# Patient Record
Sex: Male | Born: 1975 | Race: White | Hispanic: No | Smoking: Current every day smoker
Health system: Southern US, Community
[De-identification: ages and names within clinical notes are randomized; demographics above are authoritative.]

## PROBLEM LIST (undated history)

## (undated) DIAGNOSIS — B1920 Unspecified viral hepatitis C without hepatic coma: Secondary | ICD-10-CM

## (undated) DIAGNOSIS — F419 Anxiety disorder, unspecified: Secondary | ICD-10-CM

## (undated) DIAGNOSIS — F1910 Other psychoactive substance abuse, uncomplicated: Secondary | ICD-10-CM

## (undated) DIAGNOSIS — Z8719 Personal history of other diseases of the digestive system: Secondary | ICD-10-CM

## (undated) HISTORY — PX: HERNIA REPAIR: SHX51

---

## 2008-06-05 ENCOUNTER — Emergency Department (HOSPITAL_COMMUNITY): Admission: EM | Admit: 2008-06-05 | Discharge: 2008-06-05 | Payer: Self-pay | Admitting: Emergency Medicine

## 2009-05-07 ENCOUNTER — Emergency Department (HOSPITAL_COMMUNITY): Admission: EM | Admit: 2009-05-07 | Discharge: 2009-05-07 | Payer: Self-pay | Admitting: Emergency Medicine

## 2012-02-27 ENCOUNTER — Emergency Department (HOSPITAL_COMMUNITY)
Admission: EM | Admit: 2012-02-27 | Discharge: 2012-02-27 | Disposition: A | Discharge status: Home / Self Care | Payer: Self-pay | Attending: Emergency Medicine | Admitting: Emergency Medicine

## 2012-02-27 ENCOUNTER — Encounter (HOSPITAL_COMMUNITY): Payer: Self-pay | Admitting: Emergency Medicine

## 2012-02-27 ENCOUNTER — Emergency Department (HOSPITAL_COMMUNITY): Payer: Self-pay

## 2012-02-27 DIAGNOSIS — R6889 Other general symptoms and signs: Secondary | ICD-10-CM | POA: Insufficient documentation

## 2012-02-27 DIAGNOSIS — J3489 Other specified disorders of nose and nasal sinuses: Secondary | ICD-10-CM | POA: Insufficient documentation

## 2012-02-27 DIAGNOSIS — R51 Headache: Secondary | ICD-10-CM | POA: Insufficient documentation

## 2012-02-27 DIAGNOSIS — R059 Cough, unspecified: Secondary | ICD-10-CM | POA: Insufficient documentation

## 2012-02-27 DIAGNOSIS — J069 Acute upper respiratory infection, unspecified: Secondary | ICD-10-CM | POA: Insufficient documentation

## 2012-02-27 DIAGNOSIS — R5381 Other malaise: Secondary | ICD-10-CM | POA: Insufficient documentation

## 2012-02-27 DIAGNOSIS — IMO0001 Reserved for inherently not codable concepts without codable children: Secondary | ICD-10-CM | POA: Insufficient documentation

## 2012-02-27 DIAGNOSIS — R05 Cough: Secondary | ICD-10-CM | POA: Insufficient documentation

## 2012-02-27 DIAGNOSIS — B9789 Other viral agents as the cause of diseases classified elsewhere: Secondary | ICD-10-CM | POA: Insufficient documentation

## 2012-02-27 DIAGNOSIS — F17200 Nicotine dependence, unspecified, uncomplicated: Secondary | ICD-10-CM | POA: Insufficient documentation

## 2012-02-27 DIAGNOSIS — R5383 Other fatigue: Secondary | ICD-10-CM | POA: Insufficient documentation

## 2012-02-27 MED ORDER — SALINE NASAL SPRAY 0.65 % NA SOLN: 1.0000 | NASAL | Status: DC | PRN

## 2012-02-27 MED ORDER — GUAIFENESIN ER 600 MG PO TB12: 600.0000 mg | ORAL_TABLET | Freq: Two times a day (BID) | ORAL | Status: DC

## 2012-02-27 MED ORDER — BENZONATATE 100 MG PO CAPS: 200.0000 mg | ORAL_CAPSULE | Freq: Two times a day (BID) | ORAL | Status: DC | PRN

## 2012-02-27 NOTE — ED Provider Notes (Signed)
History     CSN: [id number]  Arrival date & time 02/27/12  1215   First MD Initiated Contact with Patient 02/27/12 1410      Chief Complaint  Patient presents with  . URI  . Cough  . Headache    (Consider location/radiation/quality/duration/timing/severity/associated sxs/prior treatment) HPI Comments: Brendan Preston presents with a cough, runny nose, sinus pressure, and rhinorrhea over the last week.  He has multiple coworkers with similar symptoms.  He became concerned because he has had diffivulty sleeping secondary to sinus congestion.  He denies any chest pain, sore throat, earache, NVD, or rashes.  He denies leg pain or swelling also.    The history is provided by the patient. No language interpreter was used.    History reviewed. No pertinent past medical history.  History reviewed. No pertinent past surgical history.  History reviewed. No pertinent family history.  History  Substance Use Topics  . Smoking status: Current Every Day Smoker  . Smokeless tobacco: Not on file  . Alcohol Use: Yes     Comment: occ      Review of Systems  Constitutional: Positive for fatigue. Negative for fever, chills, diaphoresis, activity change and appetite change.  HENT: Positive for congestion, rhinorrhea, sneezing, postnasal drip and sinus pressure. Negative for ear pain, nosebleeds, sore throat, facial swelling, drooling, mouth sores, trouble swallowing, neck pain, neck stiffness and ear discharge.   Eyes: Negative.   Respiratory: Positive for cough. Negative for choking, chest tightness, shortness of breath and wheezing.   Cardiovascular: Negative for chest pain, palpitations and leg swelling.  Gastrointestinal: Negative for nausea, vomiting, abdominal pain and diarrhea.  Genitourinary: Negative.   Musculoskeletal: Positive for myalgias.  Skin: Negative.   Neurological: Negative.     Allergies  Review of patient's allergies indicates no known allergies.  Home Medications    No current outpatient prescriptions on file.  BP 117/76  Pulse 71  Temp 98 F (36.7 C) (Oral)  Resp 18  SpO2 100%  Physical Exam  Nursing note and vitals reviewed. Constitutional: He is oriented to person, place, and time. He appears well-developed and well-nourished. No distress.  HENT:  Head: Normocephalic and atraumatic. Head is without raccoon's eyes, without Battle's sign, without right periorbital erythema and without left periorbital erythema. No trismus in the jaw.  Right Ear: Hearing, tympanic membrane, external ear and ear canal normal. No mastoid tenderness.  Left Ear: Hearing, tympanic membrane, external ear and ear canal normal. No mastoid tenderness.  Nose: Mucosal edema and rhinorrhea present. No nose lacerations, sinus tenderness, septal deviation or nasal septal hematoma. No epistaxis. Right sinus exhibits no maxillary sinus tenderness and no frontal sinus tenderness. Left sinus exhibits no maxillary sinus tenderness and no frontal sinus tenderness.  Mouth/Throat: Uvula is midline, oropharynx is clear and moist and mucous membranes are normal. Mucous membranes are not pale, not dry and not cyanotic. He does not have dentures. No oral lesions. Abnormal dentition. Dental caries present. No dental abscesses or uvula swelling. No oropharyngeal exudate, posterior oropharyngeal edema, posterior oropharyngeal erythema or tonsillar abscesses.  Eyes: Conjunctivae normal are normal. Pupils are equal, round, and reactive to light. Right eye exhibits no discharge. Left eye exhibits no discharge. No scleral icterus.  Neck: Normal range of motion. Neck supple. No JVD present. No tracheal deviation present.  Cardiovascular: Normal rate, regular rhythm, S1 normal, S2 normal, normal heart sounds, intact distal pulses and normal pulses.  PMI is not displaced.  Exam reveals no gallop and no decreased  pulses.   No murmur heard. Pulmonary/Chest: Effort normal and breath sounds normal. No stridor.  No respiratory distress. He has no wheezes. He has no rales. He exhibits no tenderness.  Abdominal: Soft. Bowel sounds are normal. He exhibits no distension and no mass. There is no tenderness. There is no rebound and no guarding.  Musculoskeletal: Normal range of motion. He exhibits no edema and no tenderness.  Lymphadenopathy:    He has no cervical adenopathy.  Neurological: He is alert and oriented to person, place, and time.  Skin: Skin is warm and dry. No rash noted. He is not diaphoretic. No erythema. No pallor.  Psychiatric: He has a normal mood and affect. His behavior is normal.    ED Course  Procedures (including critical care time)  Labs Reviewed - No data to display Dg Chest 2 View  02/27/2012  *RADIOLOGY REPORT*  Clinical Data: Cough.  CHEST - 2 VIEW  Comparison: None.  Findings: Cardiomediastinal silhouette appears normal.  No acute pulmonary disease is noted.  Bony thorax is intact.  IMPRESSION: No acute cardiopulmonary abnormality seen.   Original Report Authenticated By: Lupita Raider.,  M.D.      No diagnosis found.    MDM  Pt presents for evaluation of a generalized ill feeling, congestion, sinus pressure and a cough.  His symptoms and exam are consistent with a viaral URI.  Encouraged smoking cessation.  He appears nontoxic and has a clear CXR.  Plan discharge home with symptomatic care instructions.        Tobin Chad, MD 02/27/12 [phone]

## 2012-02-27 NOTE — ED Notes (Signed)
Pt given d/c teaching and prescriptions. Pt has no further questions upon d/c. Pt does not appear to be in any acute distress upon d/c.

## 2012-02-27 NOTE — ED Notes (Signed)
Pt states cough started Monday and productive enough at times to make him gag; pt states people at his work have been out with similar symptoms; pt denies nausea; denies dizziness and shortness of breath; pt mentating appropriately.

## 2012-02-27 NOTE — ED Notes (Signed)
Pt c/o URI with productive cough and yellow sputum x 1 week with generalized body aches and HA

## 2012-04-22 ENCOUNTER — Emergency Department (HOSPITAL_COMMUNITY)
Admission: EM | Admit: 2012-04-22 | Discharge: 2012-04-22 | Disposition: A | Discharge status: Home / Self Care | Payer: Self-pay | Attending: Emergency Medicine | Admitting: Emergency Medicine

## 2012-04-22 ENCOUNTER — Emergency Department (HOSPITAL_COMMUNITY): Payer: Self-pay

## 2012-04-22 ENCOUNTER — Encounter (HOSPITAL_COMMUNITY): Payer: Self-pay | Admitting: *Deleted

## 2012-04-22 DIAGNOSIS — S0990XA Unspecified injury of head, initial encounter: Secondary | ICD-10-CM | POA: Insufficient documentation

## 2012-04-22 DIAGNOSIS — F17200 Nicotine dependence, unspecified, uncomplicated: Secondary | ICD-10-CM | POA: Insufficient documentation

## 2012-04-22 DIAGNOSIS — W503XXA Accidental bite by another person, initial encounter: Secondary | ICD-10-CM

## 2012-04-22 DIAGNOSIS — IMO0002 Reserved for concepts with insufficient information to code with codable children: Secondary | ICD-10-CM | POA: Insufficient documentation

## 2012-04-22 DIAGNOSIS — Z23 Encounter for immunization: Secondary | ICD-10-CM | POA: Insufficient documentation

## 2012-04-22 MED ORDER — OXYCODONE-ACETAMINOPHEN 5-325 MG PO TABS
2.0000 | ORAL_TABLET | Freq: Once | ORAL | Status: AC
  Administered 2012-04-22: 2 via ORAL
  Filled 2012-04-22: qty 2

## 2012-04-22 MED ORDER — AMOXICILLIN-POT CLAVULANATE 875-125 MG PO TABS
1.0000 | ORAL_TABLET | Freq: Once | ORAL | Status: AC
  Administered 2012-04-22: 1 via ORAL
  Filled 2012-04-22: qty 1

## 2012-04-22 MED ORDER — AMOXICILLIN-POT CLAVULANATE 875-125 MG PO TABS: 1.0000 | ORAL_TABLET | Freq: Two times a day (BID) | ORAL | Status: DC

## 2012-04-22 MED ORDER — TETANUS-DIPHTH-ACELL PERTUSSIS 5-2.5-18.5 LF-MCG/0.5 IM SUSP
0.5000 mL | Freq: Once | INTRAMUSCULAR | Status: AC
  Administered 2012-04-22: 0.5 mL via INTRAMUSCULAR
  Filled 2012-04-22: qty 0.5

## 2012-04-22 MED ORDER — OXYCODONE-ACETAMINOPHEN 5-325 MG PO TABS: 2.0000 | ORAL_TABLET | ORAL | Status: DC | PRN
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
## 2012-04-22 NOTE — ED Notes (Signed)
Pt to xray

## 2012-04-22 NOTE — ED Notes (Signed)
Bed:WA13<BR> Expected date:<BR> Expected time:<BR> Means of arrival:<BR> Comments:<BR> ems

## 2012-04-22 NOTE — ED Notes (Signed)
Assumed care of pt. Pt states pain unchanged since having 2 percocet. Would to be cleaned and dressed.

## 2012-04-22 NOTE — Progress Notes (Signed)
  Pt aware that the [facility] program does not assist with narcotics Pt voiced understanding
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
## 2012-04-22 NOTE — Progress Notes (Signed)
[facility] ED CM consulted by ED RN about medication assistance for [place] self pay pt.  CM reviewed [facility] program  with pt.  Pt agreed to [facility] program and voiced understanding that each medicine or Rx would be $3 each and is a one time service. Stated he agreed finding a self pay [place] pcp. CM entered pt information in [facility] program and printed his letter to take to the pharmacy of his choice CM discussed and provided written information for self pay pcps, importance of pcp for f/u care, www.needymeds.org, discounted pharmacies, and other [place] resources such as financial assistance, DSS and  health department Reviewed Health connect number to assist with finding self pay provider close to pt's residence. Reviewed resources for [facility], general medical clinics, [facility] out patient pharmacies, housing, and other resources in [employer]

## 2012-04-22 NOTE — ED Notes (Signed)
md at bedside  Pt alert and oriented x4. Respirations even and unlabored, bilateral symmetrical rise and fall of chest. Skin warm and dry. In no acute distress. Denies needs.   

## 2012-04-22 NOTE — ED Notes (Signed)
Pt and pts girlfriend at bedside, pt not satisfied he was able to get additional narcotics before being discharged. I offered to obtain order for Motrin 800mg  PO, pt and girlfriend declined. Pt and girlfriend state they have no ID to obtain prescription for narcotics tonight at chain pharmacies.

## 2012-06-08 ENCOUNTER — Encounter (HOSPITAL_COMMUNITY): Payer: Self-pay | Admitting: *Deleted

## 2012-06-08 ENCOUNTER — Emergency Department (HOSPITAL_COMMUNITY)
Admission: EM | Admit: 2012-06-08 | Discharge: 2012-06-08 | Disposition: A | Discharge status: Home / Self Care | Payer: Self-pay | Attending: Emergency Medicine | Admitting: Emergency Medicine

## 2012-06-08 DIAGNOSIS — Z008 Encounter for other general examination: Secondary | ICD-10-CM | POA: Insufficient documentation

## 2012-06-08 DIAGNOSIS — F1010 Alcohol abuse, uncomplicated: Secondary | ICD-10-CM | POA: Insufficient documentation

## 2012-06-08 DIAGNOSIS — F1110 Opioid abuse, uncomplicated: Secondary | ICD-10-CM | POA: Insufficient documentation

## 2012-06-08 DIAGNOSIS — F411 Generalized anxiety disorder: Secondary | ICD-10-CM | POA: Insufficient documentation

## 2012-06-08 DIAGNOSIS — F17200 Nicotine dependence, unspecified, uncomplicated: Secondary | ICD-10-CM | POA: Insufficient documentation

## 2012-06-08 HISTORY — DX: Other psychoactive substance abuse, uncomplicated: F19.10

## 2012-06-08 HISTORY — DX: Anxiety disorder, unspecified: F41.9

## 2012-06-08 LAB — COMPREHENSIVE METABOLIC PANEL
Albumin: 4.3 g/dL (ref 3.5–5.2)
Alkaline Phosphatase: 91 U/L (ref 39–117)
BUN: 14 mg/dL (ref 6–23)
Chloride: 101 mEq/L (ref 96–112)
Creatinine, Ser: 0.74 mg/dL (ref 0.50–1.35)
GFR calc Af Amer: >90 mL/min (ref >90)
Glucose, Bld: 108 mg/dL — ABNORMAL HIGH (ref 70–99)
Total Bilirubin: 0.3 mg/dL (ref 0.3–1.2)

## 2012-06-08 LAB — CBC
HCT: 45.7 % (ref 39.0–52.0)
Hemoglobin: 15.1 g/dL (ref 13.0–17.0)
MCHC: 33 g/dL (ref 30.0–36.0)
MCV: 87.5 fL (ref 78.0–100.0)
RDW: 14.4 % (ref 11.5–15.5)
WBC: 15.1 10*3/uL — ABNORMAL HIGH (ref 4.0–10.5)

## 2012-06-08 LAB — SALICYLATE LEVEL: Salicylate Lvl: <2 mg/dL — ABNORMAL LOW (ref 2.8–20.0)

## 2012-06-08 LAB — RAPID URINE DRUG SCREEN, HOSP PERFORMED
Barbiturates: NOT DETECTED
Benzodiazepines: NOT DETECTED

## 2012-06-08 LAB — ETHANOL: Alcohol, Ethyl (B): <11 mg/dL (ref 0–11)
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
## 2012-06-08 NOTE — ED Notes (Signed)
Pt has 2 pt belonging bags and a black duffle bag, security called to wand pt and belongings

## 2012-06-08 NOTE — ED Provider Notes (Signed)
History     CSN: [id number]  Arrival date & time 06/08/12  1057   First MD Initiated Contact with Patient 06/08/12 1309      Chief Complaint  Patient presents with  . Medical Clearance  . 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  His last use was 3-4 days ago.  He also uses Marijuana, but denies use of any other recreational drugs.  He denies regular alcohol use.  He states that he drank alcohol last evening, but had not drank any alcohol for several months before that.  He denies SI or HI.  He reports that he has never gone through Heroin Detox in the past.  He denies any physical complaints at this time.  The history is provided by the patient.    Past Medical History  Diagnosis Date  . Substance abuse   . Anxiety     Past Surgical History  Procedure Laterality Date  . Hernia repair      History reviewed. No pertinent family history.  History  Substance Use Topics  . Smoking status: Current Every Day Smoker  . Smokeless tobacco: Never Used  . Alcohol Use: Yes     Comment: occ      Review of Systems  Gastrointestinal: Negative for nausea and vomiting.  Neurological: Negative for tremors and headaches.  Psychiatric/Behavioral: Negative for suicidal ideas, hallucinations and confusion.  All other systems reviewed and are negative.    Allergies  Review of patient's allergies indicates no known allergies.  Home Medications  No current outpatient prescriptions on file.  BP 106/82  Temp(Src) 98.4 F (36.9 C) (Oral)  Resp 18  SpO2 96%  Physical Exam  Nursing note and vitals reviewed. Constitutional: He appears well-developed and well-nourished. No distress.  HENT:  Head: Normocephalic and atraumatic.  Mouth/Throat: Oropharynx is clear and moist.  Eyes: EOM are  normal. Pupils are equal, round, and reactive to light.  Neck: Normal range of motion. Neck supple.  Cardiovascular: Normal rate, regular rhythm and normal heart sounds.   Pulmonary/Chest: Effort normal and breath sounds normal.  Musculoskeletal: Normal range of motion.  Neurological: He is alert.  Skin: Skin is warm and dry. He is not diaphoretic.  Psychiatric: He has a normal mood and affect. His speech is normal and behavior is normal. Thought content normal. He expresses no homicidal and no suicidal ideation. He expresses no suicidal plans and no homicidal plans.    ED Course  Procedures (including critical care time)  Labs Reviewed  CBC - Abnormal; Notable for the following:    WBC 15.1 (*)    All other components within normal limits  COMPREHENSIVE METABOLIC PANEL - Abnormal; Notable for the following:    Glucose, Bld 108 (*)    All other components within normal limits  SALICYLATE LEVEL - Abnormal; Notable for the following:    Salicylate Lvl <2.0 (*)    All other components within normal limits  URINE RAPID DRUG SCREEN (HOSP PERFORMED) - Abnormal; Notable for the following:    Opiates POSITIVE (*)    Tetrahydrocannabinol POSITIVE (*)    All other components within normal limits  ACETAMINOPHEN LEVEL  ETHANOL   No results found.   No diagnosis found.  Discussed patient with Dr. Freida Busman.  He recommends contacting ACT team.  Discussed patient with the ACT team.  They state that they  will evaluate the patient and determine if he needs placement in a detox facility.    MDM  Patient presenting requesting detox from Heroin.  Patient does not need detox from Alcohol or any other recreational drug.  Patient denies SI or HI.  Discussed with ACT team who will evaluate the patient.        Pascal Lux Bull[place], PA-C 06/08/12 1549
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
## 2012-06-10 NOTE — ED Provider Notes (Signed)
Medical screening examination/treatment/procedure(s) were performed by non-physician practitioner and as supervising physician I was immediately available for consultation/collaboration.  Toy Baker, MD 06/10/12 1351

## 2013-04-16 ENCOUNTER — Ambulatory Visit: Payer: Self-pay | Admitting: Emergency Medicine

## 2013-04-16 ENCOUNTER — Ambulatory Visit: Payer: Self-pay

## 2013-04-16 VITALS — BP 110/70 | HR 81 | Temp 98.6°F | Resp 16 | Ht 69.5 in | Wt 151.0 lb

## 2013-04-16 DIAGNOSIS — F1910 Other psychoactive substance abuse, uncomplicated: Secondary | ICD-10-CM

## 2013-04-16 DIAGNOSIS — J209 Acute bronchitis, unspecified: Secondary | ICD-10-CM

## 2013-04-16 DIAGNOSIS — R059 Cough, unspecified: Secondary | ICD-10-CM

## 2013-04-16 DIAGNOSIS — F17200 Nicotine dependence, unspecified, uncomplicated: Secondary | ICD-10-CM

## 2013-04-16 DIAGNOSIS — R05 Cough: Secondary | ICD-10-CM

## 2013-04-16 DIAGNOSIS — D649 Anemia, unspecified: Secondary | ICD-10-CM

## 2013-04-16 LAB — POCT CBC
GRANULOCYTE PERCENT: 60.2 % (ref 37–80)
HEMATOCRIT: 43.4 % — AB (ref 43.5–53.7)
HEMOGLOBIN: 13.5 g/dL — AB (ref 14.1–18.1)
Lymph, poc: 2.8 (ref 0.6–3.4)
MCH, POC: 28.4 pg (ref 27–31.2)
MCHC: 31.1 g/dL — AB (ref 31.8–35.4)
MCV: 91.3 fL (ref 80–97)
MID (cbc): 0.5 (ref 0–0.9)
MPV: 8.9 fL (ref 0–99.8)
POC GRANULOCYTE: 5 (ref 2–6.9)
POC LYMPH PERCENT: 33.6 %L (ref 10–50)
POC MID %: 6.2 %M (ref 0–12)
Platelet Count, POC: 221 10*3/uL (ref 142–424)
RBC: 4.75 M/uL (ref 4.69–6.13)
RDW, POC: 14.8 %
WBC: 8.3 10*3/uL (ref 4.6–10.2)

## 2013-04-16 LAB — POCT INFLUENZA A/B
INFLUENZA A, POC: NEGATIVE
INFLUENZA B, POC: NEGATIVE

## 2013-04-16 MED ORDER — BENZONATATE 100 MG PO CAPS: 100.0000 mg | ORAL_CAPSULE | Freq: Three times a day (TID) | ORAL | Status: DC | PRN

## 2013-04-16 MED ORDER — HYDROCODONE-HOMATROPINE 5-1.5 MG/5ML PO SYRP: 5.0000 mL | ORAL_SOLUTION | Freq: Three times a day (TID) | ORAL | Status: DC | PRN

## 2013-04-16 MED ORDER — LEVOFLOXACIN 500 MG PO TABS: 500.0000 mg | ORAL_TABLET | Freq: Every day | ORAL | Status: DC

## 2013-04-16 MED ORDER — LEVOFLOXACIN 500 MG PO TABS
500.0000 mg | ORAL_TABLET | Freq: Every day | ORAL | Status: AC
Start: 2013-04-16 — End: 2013-04-26

## 2013-04-16 NOTE — Patient Instructions (Signed)

## 2013-04-16 NOTE — Progress Notes (Addendum)
Subjective:    Patient ID: Brendan Preston, male    DOB: [birth year], 38 y.o.   MRN: [id number]  HPI This chart was scribed for Viviann Spare Deriana Vanderhoef-MD, by Ladona Ridgel Day, Scribe. This patient was seen in room 3 and the patient's care was started at 1:12 PM.  HPI Comments: Brendan Preston is a 38 y.o. male who presents to the [facility] complaining of constant, gradually worsening URI which he states feels like a viral cold or the flu, onset 4 days ago. He reports began with sinus/chest congestion, HA, cough, decreased appetite and voice change secondary to congestion. He now reports blue-colored productive cough, denies blood in sputum, he reports unsure of colored cough drops or other food/medicines/drinks. He reports sick contacts at home and work. He reports cough is worse at PM and has back pain and difficulty sleeping secondary to coughing. He has been using dayquil and nyquil every 6 hours w/minimal relief.   He did not have the flu shot this year. He smokes 1/2 ppd. No illicit drug use. Drinks ETOH only few times a month.   There are no active problems to display for this patient.  Past Surgical History  Procedure Laterality Date  . Hernia repair     History reviewed. No pertinent family history.  History   Social History  . Marital Status: Divorced    Spouse Name: N/A    Number of Children: N/A  . Years of Education: N/A   Occupational History  . Not on file.   Social History Main Topics  . Smoking status: Current Every Day Smoker  . Smokeless tobacco: Never Used  . Alcohol Use: Yes     Comment: occ  . Drug Use: Yes    Special: Marijuana     Comment: Heroin   . Sexual Activity: Not on file   Other Topics Concern  . Not on file   Social History Narrative  . No narrative on file   No Known Allergies  Results for orders placed during the hospital encounter of 06/08/12  ACETAMINOPHEN LEVEL      Result Value Range   Acetaminophen (Tylenol), Serum <15.0   10 - 30 ug/mL  CBC      Result Value Range   WBC 15.1 (*) 4.0 - 10.5 K/uL   RBC 5.22  4.22 - 5.81 MIL/uL   Hemoglobin 15.1  13.0 - 17.0 g/dL   HCT 40.9  81.1 - 91.4 %   MCV 87.5  78.0 - 100.0 fL   MCH 28.9  26.0 - 34.0 pg   MCHC 33.0  30.0 - 36.0 g/dL   RDW 78.2  95.6 - 21.3 %   Platelets 393  150 - 400 K/uL  COMPREHENSIVE METABOLIC PANEL      Result Value Range   Sodium 139  135 - 145 mEq/L   Potassium 3.8  3.5 - 5.1 mEq/L   Chloride 101  96 - 112 mEq/L   CO2 24  19 - 32 mEq/L   Glucose, Bld 108 (*) 70 - 99 mg/dL   BUN 14  6 - 23 mg/dL   Creatinine, Ser 0.86  0.50 - 1.35 mg/dL   Calcium 9.5  8.4 - 57.8 mg/dL   Total Protein 8.1  6.0 - 8.3 g/dL   Albumin 4.3  3.5 - 5.2 g/dL   AST 17  0 - 37 U/L   ALT 18  0 - 53 U/L   Alkaline Phosphatase 91  39 - 117  U/L   Total Bilirubin 0.3  0.3 - 1.2 mg/dL   GFR calc non Af Amer >90  >90 mL/min   GFR calc Af Amer >90  >90 mL/min  ETHANOL      Result Value Range   Alcohol, Ethyl (B) <11  0 - 11 mg/dL  SALICYLATE LEVEL      Result Value Range   Salicylate Lvl <2.0 (*) 2.8 - 20.0 mg/dL  URINE RAPID DRUG SCREEN (HOSP PERFORMED)      Result Value Range   Opiates POSITIVE (*) NONE DETECTED   Cocaine NONE DETECTED  NONE DETECTED   Benzodiazepines NONE DETECTED  NONE DETECTED   Amphetamines NONE DETECTED  NONE DETECTED   Tetrahydrocannabinol POSITIVE (*) NONE DETECTED   Barbiturates NONE DETECTED  NONE DETECTED   Review of Systems  Constitutional: Positive for appetite change and fatigue. Negative for fever and chills.  HENT: Positive for congestion, sinus pressure, sneezing and voice change. Negative for ear pain.   Respiratory: Positive for cough. Negative for shortness of breath.   Cardiovascular: Negative for chest pain.  Gastrointestinal: Negative for nausea, vomiting, abdominal pain and diarrhea.  Musculoskeletal: Positive for back pain.  Neurological: Positive for headaches.      Objective:   Physical Exam Nursing note and  vitals reviewed. Constitutional: Patient is oriented to person, place, and time. Patient appears well-developed and well-nourished. No distress.  patient did seem somewhat drowsy and went appear to fall asleep at time but is easily arousable HENT TMs are clear nose is congested the:  Head: Normocephalic and atraumatic.  Neck: Neck supple. No tracheal deviation present.  Cardiovascular: Normal rate, regular rhythm and normal heart sounds.   No murmur heard. Pulmonary/Chest: Effort normal and breath sounds normal. No respiratory distress. Patient has no wheezes. Patient has no rales.  Musculoskeletal: Normal range of motion.  Neurological: Patient is alert and oriented to person, place, and time.  Skin: Skin is warm and dry.  Psychiatric: Patient has a normal mood and affect. Patient's behavior is normal.  I checked both arms and there were no needle tracks seen. Triage Vitals: BP 110/70  Pulse 81  Temp(Src) 98.6 F (37 C) (Oral)  Resp 16  Ht 5' 9.5" (1.765 m)  Wt 151 lb (68.493 kg)  BMI 21.99 kg/m2  SpO2 94%  DIAGNOSTIC STUDIES: Oxygen Saturation is 94% on room air, adequate by my interpretation.   Results for orders placed in visit on 04/16/13  POCT CBC      Result Value Range   WBC 8.3  4.6 - 10.2 K/uL   Lymph, poc 2.8  0.6 - 3.4   POC LYMPH PERCENT 33.6  10 - 50 %L   MID (cbc) 0.5  0 - 0.9   POC MID % 6.2  0 - 12 %M   POC Granulocyte 5.0  2 - 6.9   Granulocyte percent 60.2  37 - 80 %G   RBC 4.75  4.69 - 6.13 M/uL   Hemoglobin 13.5 (*) 14.1 - 18.1 g/dL   HCT, POC 16.1 (*) 09.6 - 53.7 %   MCV 91.3  80 - 97 fL   MCH, POC 28.4  27 - 31.2 pg   MCHC 31.1 (*) 31.8 - 35.4 g/dL   RDW, POC 04.5     Platelet Count, POC 221  142 - 424 K/uL   MPV 8.9  0 - 99.8 fL  POCT INFLUENZA A/B      Result Value Range   Influenza A, POC  Negative     Influenza B, POC Negative    [facility] reading (PRIMARY) by  Dr. Cleta Albertsaub there appears to be significant increase in the markings in both lung fields  worse in the bases. There are no consolidated infiltrates seen.   COORDINATION OF CARE: At 110 PM Discussed treatment plan with patient which includes CXR. Patient agrees.      Assessment & Plan:  This appears to be a significant bronchitis and flu tests are negative. He is a half-pack per day smoker. We'll treat with Levaquin x10 days repeat chest x-ray in 14 days recheck in 48-72 hours if not doing better. Patient has a history of heroin use. He was somewhat sleepy in the room but denies any use of heroin. he states he goes to meetings 3 times a week and has a sponsor. I ambulated him in the hall he did drop to 90-93. He rebounded very quickly to 94/95 when he stopped. I again took him back to the room and asked him about any heroin use. He adamantly denies this. He stated he had to go. He stated he could not wait even though he has an out of work note.. I told him I was very concerned about him and he stated he was fine and did not need any help at the present time. He did cough up some mucus that was thickish with a bluish green color in the room. He was given a prescription written for Levaquin and Tessalon Perles. Plan again discussing the issue about heroin use patient was adamant about leaving. initially that patient was very cooperative. Then he stated he had to leave immediately. He told me his heroin use was no longer a problem he was going to meetings and seeing a sponsor regular. . I personally performed the services described in this documentation, which was scribed in my presence. The recorded information has been reviewed and is accurate

## 2013-04-17 LAB — HIV ANTIBODY (ROUTINE TESTING W REFLEX): HIV: NONREACTIVE

## 2013-04-19 ENCOUNTER — Telehealth: Payer: Self-pay

## 2013-04-19 DIAGNOSIS — J209 Acute bronchitis, unspecified: Secondary | ICD-10-CM

## 2013-04-19 LAB — RESPIRATORY CULTURE OR RESPIRATORY AND SPUTUM CULTURE: Organism ID, Bacteria: NORMAL

## 2013-04-19 MED ORDER — HYDROCODONE-HOMATROPINE 5-1.5 MG/5ML PO SYRP: 5.0000 mL | ORAL_SOLUTION | Freq: Three times a day (TID) | ORAL | Status: DC | PRN

## 2013-04-19 NOTE — Telephone Encounter (Signed)
Pt was seen on Friday and picking up his prescriptions today. [place] to [place]. He states he was given 3 Rx while he was in the office according to his AVS. He only has 2 in his possession, the tessalon and the levaquin. Pt needs the Hycodan reprinted. He is starting on his antibiotic today and the tessalon.    Spoke to Dr. Cleta Albertsaub to reprint Hycodan and the prescription was cancelled due to history of substance abuse. I advised pt to use Mucinex as well as the Tessalon. Pt stated understanding.   Prescription has been cancelled.

## 2013-04-19 NOTE — Telephone Encounter (Addendum)
PT WAS SEEN THE OTHER DAY AND WOULD LIKE TO SPEAK WITH SOMEONE REGARDING HIS MEDICINE, WE HAVE HIM ON 3 BUT THE PAPER HE WAS GIVEN IS SAYING 2 PLEASE CALL [phone] AND HE ONLY HAVE 2 PILLS LEFT

## 2013-04-29 ENCOUNTER — Ambulatory Visit: Payer: Self-pay

## 2013-04-29 ENCOUNTER — Ambulatory Visit (INDEPENDENT_AMBULATORY_CARE_PROVIDER_SITE_OTHER): Payer: Self-pay | Admitting: Emergency Medicine

## 2013-04-29 VITALS — BP 100/62 | HR 87 | Temp 98.6°F | Resp 18 | Ht 69.5 in | Wt 153.0 lb

## 2013-04-29 DIAGNOSIS — R05 Cough: Secondary | ICD-10-CM

## 2013-04-29 DIAGNOSIS — F1910 Other psychoactive substance abuse, uncomplicated: Secondary | ICD-10-CM

## 2013-04-29 DIAGNOSIS — R059 Cough, unspecified: Secondary | ICD-10-CM

## 2013-04-29 DIAGNOSIS — J40 Bronchitis, not specified as acute or chronic: Secondary | ICD-10-CM

## 2013-04-29 MED ORDER — LEVOFLOXACIN 500 MG PO TABS: 500.0000 mg | ORAL_TABLET | Freq: Every day | ORAL | Status: DC

## 2013-04-29 MED ORDER — PROMETHAZINE-DM 6.25-15 MG/5ML PO SYRP: 5.0000 mL | ORAL_SOLUTION | Freq: Four times a day (QID) | ORAL | Status: DC | PRN

## 2013-04-29 NOTE — Progress Notes (Signed)
Subjective:    Patient ID: Brendan Preston, male    DOB: [birth year], 38 y.o.   MRN: [id number]  HPI 38 year old male presents for follow up of a 14 day history of acute bronchitis. Patient initially presented on 04/16/13 with a 4 day history of influenza like symptoms including nasal, sinus, and chest congestion, headache, cough, fever, chills, and myalgias. His cough at that time was productive of blue-colored sputum one time, then returned back to being yellow/green. He had a negative influenza test, WBC count of 8.3, negative HIV, sputum culture grew out normal oropharyngeal flora, and cxr mild peribronchial thickening without focal pneumonia. He was started on Levaquin 500 mg daily, however he did not start this until several days later as he still has 3 days left of his therapy. He has picked up his Hycodan plus a refill per [facility] records. He has also picked up his Tessalon.   Today he comes in stating that he does feel some better, but his cough is still quite bad, especially at nighttime which is keeping him awake. He is sore along the bilateral posterior thoracic back. He will wake up from coughing and have a difficult time falling back asleep because he is sore along this region. He does complain of subjective fever and chills, but everytime he checks his temperature when he feels hot or cold it is normal. He does feel like he is SOB and wheezing. He notes that is congestion, sinus pressure, and headache are still present, but improved.       PMH: Past Medical History  Diagnosis Date  . Substance abuse   . Anxiety     Home Meds: Prior to Admission medications   Medication Sig Start Date End Date Taking? Authorizing Provider  benzonatate (TESSALON) 100 MG capsule Take 1-2 capsules (100-200 mg total) by mouth 3 (three) times daily as needed for cough. 04/16/13  Yes Collene Gobble, MD    Allergies:  Allergies  Allergen Reactions  . Penicillins     History   Social History  . Marital  Status: Divorced    Spouse Name: N/A    Number of Children: N/A  . Years of Education: N/A   Occupational History  . Not on file.   Social History Main Topics  . Smoking status: Current Every Day Smoker  . Smokeless tobacco: Never Used  . Alcohol Use: Yes     Comment: occ  . Drug Use: Yes    Special: Marijuana     Comment: Heroin   . Sexual Activity: Not on file   Other Topics Concern  . Not on file   Social History Narrative  . No narrative on file      Review of Systems  Constitutional: Positive for chills and fatigue. Negative for fever.  HENT: Positive for congestion, hearing loss, postnasal drip, rhinorrhea and sinus pressure. Negative for sore throat.   Respiratory: Positive for cough, chest tightness, shortness of breath and wheezing.        Sputum is yellow, green, to brown. It was blue one day.   Gastrointestinal: Positive for nausea and vomiting. Negative for diarrhea.  Neurological: Positive for headaches.       Objective:   Physical Exam  Physical Exam: Blood pressure 100/62, pulse 87, temperature 98.6 F (37 C), temperature source Oral, resp. rate 18, height 5' 9.5" (1.765 m), weight 153 lb (69.4 kg), SpO2 98.00%., Body mass index is 22.28 kg/(m^2). General: Well developed, well nourished, in no  acute distress. Awake and alert.  Head: Normocephalic, atraumatic, eyes without discharge, sclera non-icteric, nares are congested. Bilateral auditory canals clear, TM's are without perforation, pearly grey with reflective cone of light bilaterally. No sinus TTP. Oral cavity moist, dentition normal. Posterior pharynx with post nasal drip and mild erythema. No peritonsillar abscess or tonsillar exudate. Uvula midline.  Neck: Supple. No thyromegaly. Full ROM. No lymphadenopathy. Lungs: Decreased breath sounds bilaterally from mid thoracic region inferiorly without wheezes, rales, or rhonchi. Breathing is unlabored.  Heart: RRR with S1 S2. No murmurs, rubs, or gallops  appreciated. Msk:  Strength and tone normal for age. Extremities: No clubbing or cyanosis. No edema. Neuro: Alert and oriented X 3. Moves all extremities spontaneously. CNII-XII grossly in tact. Psych:  Responds to questions appropriately with a normal affect.   CXR:  [facility] reading (PRIMARY) by  Dr. Cleta Albertsaub.  Unchanged from previous. Continued perihilar thickening without focal pneumonia.   Previous study reviewed    Assessment & Plan:  38 year old male with bronchitis and cough -Add 7 more days of Levaquin 500 mg daily  -Robitussin with Phenergan 1 tsp po qid prn cough #120 mL no RF-Rx voided -Patient upset that he did not receive controlled cough medication and left without this prescription -Patient states the Tessalon does not help his cough  -Mucinex -RTC precautions -Upon returning to the room to review his x ray with him he was not in his room   Eula Listenyan Dunn, MHS, PA-C [facility] [address] [place] [address] [phone] [facility] 04/29/2013 3:20 PM  Note from Dr. Cleta Albertsaub:   Patient confronted me in the hall that I had done nothing for him. I told him that I had prescribed him antibiotics that would help with his cough. I told him that I agreed with Eula Listenyan DUnn, that prescribing narcotic cough syrup with be contraindicated with his history. Patient stated that his drug issues were years ago. I advised patient that he had been into the ED in the spring to get help with heroin use. I told him we were here and happy to help him with any substance issues he was having. Patient stated he was not having any issues with drugs an that he was going to [facility] everyday. Patient states that he could have been seen in the ED for free. I decided to refund his co pay since money is a huge issue for him and he state we have done nothing to help him. I told him it will be unethical for us to prescribe narcotics and risk him relapsing. I would advise that he get help  from pulmonary due to his infection.    Earl LitesSteve Daub, MD 04/29/2013 5:22 PM

## 2014-10-13 ENCOUNTER — Inpatient Hospital Stay (HOSPITAL_COMMUNITY): Payer: Self-pay | Admitting: Certified Registered"

## 2014-10-13 ENCOUNTER — Inpatient Hospital Stay (HOSPITAL_COMMUNITY)
Admission: EM | Admit: 2014-10-13 | Discharge: 2014-10-16 | DRG: 854 | Disposition: A | Discharge status: Home / Self Care | Payer: Self-pay | Attending: Internal Medicine | Admitting: Internal Medicine

## 2014-10-13 ENCOUNTER — Encounter (HOSPITAL_COMMUNITY): Payer: Self-pay

## 2014-10-13 ENCOUNTER — Emergency Department (HOSPITAL_COMMUNITY): Payer: Self-pay

## 2014-10-13 ENCOUNTER — Encounter (HOSPITAL_COMMUNITY)
Admission: EM | Disposition: A | Discharge status: Home / Self Care | Payer: Self-pay | Source: Home / Self Care | Attending: Internal Medicine

## 2014-10-13 DIAGNOSIS — E871 Hypo-osmolality and hyponatremia: Secondary | ICD-10-CM

## 2014-10-13 DIAGNOSIS — L0390 Cellulitis, unspecified: Secondary | ICD-10-CM

## 2014-10-13 DIAGNOSIS — A419 Sepsis, unspecified organism: Principal | ICD-10-CM

## 2014-10-13 DIAGNOSIS — B999 Unspecified infectious disease: Secondary | ICD-10-CM

## 2014-10-13 DIAGNOSIS — F1110 Opioid abuse, uncomplicated: Secondary | ICD-10-CM

## 2014-10-13 DIAGNOSIS — F17210 Nicotine dependence, cigarettes, uncomplicated: Secondary | ICD-10-CM | POA: Diagnosis present

## 2014-10-13 DIAGNOSIS — E876 Hypokalemia: Secondary | ICD-10-CM

## 2014-10-13 DIAGNOSIS — L03119 Cellulitis of unspecified part of limb: Secondary | ICD-10-CM

## 2014-10-13 DIAGNOSIS — L03114 Cellulitis of left upper limb: Secondary | ICD-10-CM

## 2014-10-13 DIAGNOSIS — E861 Hypovolemia: Secondary | ICD-10-CM | POA: Diagnosis present

## 2014-10-13 HISTORY — PX: I&D EXTREMITY: SHX5045

## 2014-10-13 LAB — URINALYSIS, ROUTINE W REFLEX MICROSCOPIC
Glucose, UA: NEGATIVE mg/dL
Ketones, ur: 15 mg/dL — AB
Leukocytes, UA: NEGATIVE
Nitrite: NEGATIVE
Protein, ur: 100 mg/dL — AB
SPECIFIC GRAVITY, URINE: 1.028 (ref 1.005–1.030)
UROBILINOGEN UA: 0.2 mg/dL (ref 0.0–1.0)
pH: 5 (ref 5.0–8.0)

## 2014-10-13 LAB — I-STAT CG4 LACTIC ACID, ED: LACTIC ACID, VENOUS: 1.54 mmol/L (ref 0.5–2.0)

## 2014-10-13 LAB — CBC WITH DIFFERENTIAL/PLATELET
BASOS ABS: 0 10*3/uL (ref 0.0–0.1)
Basophils Relative: 0 % (ref 0–1)
Eosinophils Absolute: 0 10*3/uL (ref 0.0–0.7)
Eosinophils Relative: 0 % (ref 0–5)
HEMATOCRIT: 42.8 % (ref 39.0–52.0)
Hemoglobin: 14.7 g/dL (ref 13.0–17.0)
Lymphocytes Relative: 19 % (ref 12–46)
Lymphs Abs: 2.9 10*3/uL (ref 0.7–4.0)
MCH: 28.6 pg (ref 26.0–34.0)
MCHC: 34.3 g/dL (ref 30.0–36.0)
MCV: 83.3 fL (ref 78.0–100.0)
MONO ABS: 1.1 10*3/uL — AB (ref 0.1–1.0)
Monocytes Relative: 7 % (ref 3–12)
NEUTROS ABS: 11.4 10*3/uL — AB (ref 1.7–7.7)
Neutrophils Relative %: 74 % (ref 43–77)
PLATELETS: 234 10*3/uL (ref 150–400)
RBC: 5.14 MIL/uL (ref 4.22–5.81)
RDW: 14 % (ref 11.5–15.5)
WBC: 15.3 10*3/uL — ABNORMAL HIGH (ref 4.0–10.5)

## 2014-10-13 LAB — COMPREHENSIVE METABOLIC PANEL
ALK PHOS: 76 U/L (ref 38–126)
ALT: 12 U/L — AB (ref 17–63)
ANION GAP: 11 (ref 5–15)
AST: 16 U/L (ref 15–41)
Albumin: 4 g/dL (ref 3.5–5.0)
BUN: 10 mg/dL (ref 6–20)
CO2: 26 mmol/L (ref 22–32)
Calcium: 9.2 mg/dL (ref 8.9–10.3)
Chloride: 95 mmol/L — ABNORMAL LOW (ref 101–111)
Creatinine, Ser: 0.96 mg/dL (ref 0.61–1.24)
GFR calc Af Amer: >60 mL/min (ref >60)
GFR calc non Af Amer: >60 mL/min (ref >60)
GLUCOSE: 113 mg/dL — AB (ref 65–99)
POTASSIUM: 3.4 mmol/L — AB (ref 3.5–5.1)
SODIUM: 132 mmol/L — AB (ref 135–145)
TOTAL PROTEIN: 8.1 g/dL (ref 6.5–8.1)
Total Bilirubin: 0.8 mg/dL (ref 0.3–1.2)

## 2014-10-13 LAB — URINE MICROSCOPIC-ADD ON

## 2014-10-13 LAB — SEDIMENTATION RATE: Sed Rate: 17 mm/hr — ABNORMAL HIGH (ref 0–16)

## 2014-10-13 SURGERY — IRRIGATION AND DEBRIDEMENT EXTREMITY
Anesthesia: General | Site: Elbow | Laterality: Left

## 2014-10-13 MED ORDER — PROPOFOL 10 MG/ML IV BOLUS
INTRAVENOUS | Status: DC | PRN
  Administered 2014-10-13: 200 mg via INTRAVENOUS

## 2014-10-13 MED ORDER — LIDOCAINE HCL (CARDIAC) 20 MG/ML IV SOLN
INTRAVENOUS | Status: DC | PRN
  Administered 2014-10-13: 40 mg via INTRAVENOUS

## 2014-10-13 MED ORDER — PROPOFOL 10 MG/ML IV BOLUS
INTRAVENOUS | Status: AC
  Filled 2014-10-13: qty 20

## 2014-10-13 MED ORDER — VANCOMYCIN HCL 10 G IV SOLR
1250.0000 mg | Freq: Once | INTRAVENOUS | Status: AC
  Administered 2014-10-14: 1250 mg via INTRAVENOUS
  Filled 2014-10-13: qty 1250

## 2014-10-13 MED ORDER — MIDAZOLAM HCL 5 MG/5ML IJ SOLN
INTRAMUSCULAR | Status: DC | PRN
  Administered 2014-10-13: 2 mg via INTRAVENOUS

## 2014-10-13 MED ORDER — HYDROMORPHONE HCL 1 MG/ML IJ SOLN
1.0000 mg | Freq: Once | INTRAMUSCULAR | Status: AC
  Administered 2014-10-13: 1 mg via INTRAVENOUS
  Filled 2014-10-13: qty 1

## 2014-10-13 MED ORDER — LACTATED RINGERS IV SOLN
INTRAVENOUS | Status: DC | PRN
  Administered 2014-10-13: via INTRAVENOUS

## 2014-10-13 MED ORDER — MIDAZOLAM HCL 2 MG/2ML IJ SOLN
INTRAMUSCULAR | Status: AC
  Filled 2014-10-13: qty 2

## 2014-10-13 MED ORDER — DEXMEDETOMIDINE HCL 200 MCG/2ML IV SOLN
INTRAVENOUS | Status: DC | PRN
  Administered 2014-10-13 (×2): 16 ug via INTRAVENOUS
  Administered 2014-10-14: 8 ug via INTRAVENOUS

## 2014-10-13 MED ORDER — BUPIVACAINE HCL (PF) 0.25 % IJ SOLN
INTRAMUSCULAR | Status: AC
  Filled 2014-10-13: qty 30

## 2014-10-13 MED ORDER — FENTANYL CITRATE (PF) 250 MCG/5ML IJ SOLN
INTRAMUSCULAR | Status: AC
  Filled 2014-10-13: qty 5

## 2014-10-13 MED ORDER — FENTANYL CITRATE (PF) 100 MCG/2ML IJ SOLN
INTRAMUSCULAR | Status: DC | PRN
  Administered 2014-10-13: 150 ug via INTRAVENOUS
  Administered 2014-10-13: 100 ug via INTRAVENOUS

## 2014-10-13 MED ORDER — OXYCODONE-ACETAMINOPHEN 5-325 MG PO TABS
1.0000 | ORAL_TABLET | Freq: Once | ORAL | Status: AC
  Administered 2014-10-13: 1 via ORAL
  Filled 2014-10-13: qty 1

## 2014-10-13 SURGICAL SUPPLY — 59 items
BANDAGE COBAN STERILE 2 (GAUZE/BANDAGES/DRESSINGS) IMPLANT
BANDAGE ELASTIC 3 VELCRO ST LF (GAUZE/BANDAGES/DRESSINGS) ×3 IMPLANT
BANDAGE ELASTIC 4 VELCRO ST LF (GAUZE/BANDAGES/DRESSINGS) ×3 IMPLANT
BANDAGE ELASTIC 6 VELCRO ST LF (GAUZE/BANDAGES/DRESSINGS) ×3 IMPLANT
BNDG COHESIVE 1X5 TAN STRL LF (GAUZE/BANDAGES/DRESSINGS) IMPLANT
BNDG CONFORM 2 STRL LF (GAUZE/BANDAGES/DRESSINGS) IMPLANT
BNDG ESMARK 4X9 LF (GAUZE/BANDAGES/DRESSINGS) IMPLANT
BNDG GAUZE ELAST 4 BULKY (GAUZE/BANDAGES/DRESSINGS) ×3 IMPLANT
CORDS BIPOLAR (ELECTRODE) ×3 IMPLANT
COVER SURGICAL LIGHT HANDLE (MISCELLANEOUS) ×3 IMPLANT
DECANTER SPIKE VIAL GLASS SM (MISCELLANEOUS) ×3 IMPLANT
DRAIN PENROSE 1/4X12 LTX STRL (WOUND CARE) IMPLANT
DRSG ADAPTIC 3X8 NADH LF (GAUZE/BANDAGES/DRESSINGS) IMPLANT
DRSG EMULSION OIL 3X3 NADH (GAUZE/BANDAGES/DRESSINGS) ×3 IMPLANT
DRSG PAD ABDOMINAL 8X10 ST (GAUZE/BANDAGES/DRESSINGS) ×3 IMPLANT
GAUZE IODOFORM PACK 1/2 7832 (GAUZE/BANDAGES/DRESSINGS) ×3 IMPLANT
GAUZE SPONGE 4X4 12PLY STRL (GAUZE/BANDAGES/DRESSINGS) ×3 IMPLANT
GAUZE XEROFORM 1X8 LF (GAUZE/BANDAGES/DRESSINGS) ×3 IMPLANT
GLOVE BIO SURGEON STRL SZ7.5 (GLOVE) ×3 IMPLANT
GLOVE BIOGEL PI IND STRL 7.0 (GLOVE) ×2 IMPLANT
GLOVE BIOGEL PI IND STRL 8 (GLOVE) ×1 IMPLANT
GLOVE BIOGEL PI INDICATOR 7.0 (GLOVE) ×4
GLOVE BIOGEL PI INDICATOR 8 (GLOVE) ×2
GLOVE ECLIPSE 7.0 STRL STRAW (GLOVE) ×3 IMPLANT
GOWN STRL REUS W/ TWL LRG LVL3 (GOWN DISPOSABLE) ×1 IMPLANT
GOWN STRL REUS W/TWL LRG LVL3 (GOWN DISPOSABLE) ×2
HOOD PEEL AWAY FACE SHEILD DIS (HOOD) ×3 IMPLANT
KIT BASIN OR (CUSTOM PROCEDURE TRAY) ×3 IMPLANT
KIT ROOM TURNOVER OR (KITS) ×3 IMPLANT
LOOP VESSEL MAXI BLUE (MISCELLANEOUS) IMPLANT
LOOP VESSEL MINI RED (MISCELLANEOUS) IMPLANT
MANIFOLD NEPTUNE II (INSTRUMENTS) ×3 IMPLANT
NEEDLE HYPO 25X1 1.5 SAFETY (NEEDLE) IMPLANT
NS IRRIG 1000ML POUR BTL (IV SOLUTION) ×3 IMPLANT
PACK ORTHO EXTREMITY (CUSTOM PROCEDURE TRAY) ×3 IMPLANT
PAD ARMBOARD 7.5X6 YLW CONV (MISCELLANEOUS) ×6 IMPLANT
PADDING CAST ABS 6INX4YD NS (CAST SUPPLIES) ×2
PADDING CAST ABS COTTON 6X4 NS (CAST SUPPLIES) ×1 IMPLANT
SCRUB BETADINE 4OZ XXX (MISCELLANEOUS) ×3 IMPLANT
SET CYSTO W/LG BORE CLAMP LF (SET/KITS/TRAYS/PACK) ×6 IMPLANT
SOLUTION BETADINE 4OZ (MISCELLANEOUS) ×3 IMPLANT
SPLINT PLASTER CAST XFAST 5X30 (CAST SUPPLIES) ×1 IMPLANT
SPLINT PLASTER XFAST SET 5X30 (CAST SUPPLIES) ×2
SPONGE GAUZE 4X4 12PLY STER LF (GAUZE/BANDAGES/DRESSINGS) ×3 IMPLANT
SPONGE LAP 18X18 X RAY DECT (DISPOSABLE) ×3 IMPLANT
SPONGE LAP 4X18 X RAY DECT (DISPOSABLE) ×3 IMPLANT
SUCTION FRAZIER TIP 10 FR DISP (SUCTIONS) ×3 IMPLANT
SUT ETHILON 4 0 PS 2 18 (SUTURE) ×3 IMPLANT
SUT MON AB 5-0 P3 18 (SUTURE) IMPLANT
SYR CONTROL 10ML LL (SYRINGE) IMPLANT
TOWEL OR 17X24 6PK STRL BLUE (TOWEL DISPOSABLE) ×3 IMPLANT
TOWEL OR 17X26 10 PK STRL BLUE (TOWEL DISPOSABLE) ×3 IMPLANT
TUBE ANAEROBIC SPECIMEN COL (MISCELLANEOUS) IMPLANT
TUBE CONNECTING 12'X1/4 (SUCTIONS) ×1
TUBE CONNECTING 12X1/4 (SUCTIONS) ×2 IMPLANT
TUBE FEEDING 5FR 15 INCH (TUBING) IMPLANT
UNDERPAD 30X30 INCONTINENT (UNDERPADS AND DIAPERS) ×3 IMPLANT
WATER STERILE IRR 1000ML POUR (IV SOLUTION) ×3 IMPLANT
YANKAUER SUCT BULB TIP NO VENT (SUCTIONS) ×3 IMPLANT

## 2014-10-13 NOTE — ED Notes (Signed)
Per patient, patient started to have pain and swelling in the left arm a couple days ago. Patient reports edema, erythema, and stiffness in the left elbow. Denies injury. Patient reports severe pain and inability to straighten arm.

## 2014-10-13 NOTE — ED Notes (Signed)
Patient transported to X-ray 

## 2014-10-13 NOTE — Anesthesia Preprocedure Evaluation (Signed)
Anesthesia Evaluation  Patient identified by MRN, date of birth, ID band Patient awake    Reviewed: Allergy & Precautions, NPO status , Patient's Chart, lab work & pertinent test results  Airway Mallampati: II  TM Distance: >3 FB Neck ROM: Full    Dental  (+) Teeth Intact, Poor Dentition, Dental Advisory Given   Pulmonary Current Smoker,  breath sounds clear to auscultation        Cardiovascular Rhythm:Regular Rate:Normal     Neuro/Psych    GI/Hepatic   Endo/Other    Renal/GU      Musculoskeletal   Abdominal   Peds  Hematology   Anesthesia Other Findings   Reproductive/Obstetrics                             Anesthesia Physical Anesthesia Plan  ASA: III and emergent  Anesthesia Plan: General   Post-op Pain Management:    Induction: Intravenous  Airway Management Planned: Oral ETT  Additional Equipment:   Intra-op Plan:   Post-operative Plan: Extubation in OR  Informed Consent: I have reviewed the patients History and Physical, chart, labs and discussed the procedure including the risks, benefits and alternatives for the proposed anesthesia with the patient or authorized representative who has indicated his/her understanding and acceptance.   Dental advisory given  Plan Discussed with: CRNA and Anesthesiologist  Anesthesia Plan Comments: (IV drug abuser Smoker Abscess L.arm  Plan GA with RSI )        Anesthesia Quick Evaluation

## 2014-10-13 NOTE — ED Notes (Signed)
Pt signed consent for procedure form and going up to the OR.

## 2014-10-13 NOTE — Anesthesia Procedure Notes (Signed)
Procedure Name: Intubation Date/Time: 10/13/2014 11:49 PM Performed by: Arlice ColtMANESS, Marayah Higdon B Pre-anesthesia Checklist: Patient identified, Emergency Drugs available, Suction available, Patient being monitored and Timeout performed Patient Re-evaluated:Patient Re-evaluated prior to inductionOxygen Delivery Method: Circle system utilized Preoxygenation: Pre-oxygenation with 100% oxygen Intubation Type: IV induction and Rapid sequence Laryngoscope Size: Mac and 3 Grade View: Grade I Tube type: Oral Tube size: 7.5 mm Number of attempts: 1 Airway Equipment and Method: Stylet Placement Confirmation: ETT inserted through vocal cords under direct vision,  positive ETCO2 and breath sounds checked- equal and bilateral Secured at: 22 cm Tube secured with: Tape Dental Injury: Teeth and Oropharynx as per pre-operative assessment

## 2014-10-13 NOTE — H&P (Signed)
[facility] History and Physical  Brendan RacerJeremy Preston ZOX:[id number] DOB: [birth year] DOA: 10/13/2014  Referring physician: Elwin MochaBlair Walden, MD PCP: No PCP Per Patient   Chief Complaint: Arm Swelling  HPI: Brendan RacerJeremy Preston is a 39 y.o. male with history of Heroin Abuse presents to the ED with swelling and pain in the Left upper arm. He has been regularly injecting in the arm. He noticed today that his left arm has been painful and has become swollen with increased redness. Patient states that pain became unbearable so he came into the ED. He has noted a fever to 102F. Patient has felt weak also and in the ED he was initially noted to be HYPOtensive. Patient has no headache he has dizziness and general weakness.   Review of Systems:  Complete 12 point ROS performed and is unremarkable other than noted above  Past Medical History  Diagnosis Date  . Substance abuse   . Anxiety    Past Surgical History  Procedure Laterality Date  . Hernia repair     Social History:  reports that he has been smoking.  He has never used smokeless tobacco. He reports that he drinks alcohol. He reports that he uses illicit drugs (Marijuana).  Allergies  Allergen Reactions  . Penicillins     unknown    History reviewed. No pertinent family history.   Prior to Admission medications   Medication Sig Start Date End Date Taking? Authorizing Provider  carisoprodol (SOMA) 350 MG tablet Take 350 mg by mouth daily as needed for muscle spasms.   Yes Historical Provider, MD  oxycodone (ROXICODONE) 30 MG immediate release tablet Take 30 mg by mouth 3 (three) times daily. Take three times every day per patient   Yes Historical Provider, MD  benzonatate (TESSALON) 100 MG capsule Take 1-2 capsules (100-200 mg total) by mouth 3 (three) times daily as needed for cough. Patient not taking: Reported on 10/13/2014 04/16/13   Collene GobbleSteven A Daub, MD  levofloxacin (LEVAQUIN) 500 MG tablet Take 1 tablet (500 mg total) by mouth  daily. Patient not taking: Reported on 10/13/2014 04/29/13   Raymon Muttonyan M Dunn, PA-C  promethazine-dextromethorphan (PROMETHAZINE-DM) 6.25-15 MG/5ML syrup Take 5 mLs by mouth 4 (four) times daily as needed for cough. Patient not taking: Reported on 10/13/2014 04/29/13   Sondra Bargesyan M Dunn, PA-C   Physical Exam: Ceasar MonsFiled Vitals:   10/13/14 2112 10/13/14 2114 10/13/14 2115 10/13/14 2215  BP: 120/70  118/66 122/75  Pulse: 81  84 81  Temp:  100.1 F (37.8 C)    TempSrc:  Oral    Resp: 16   16  Height:      Weight:      SpO2: 98%  97% 97%    Wt Readings from Last 3 Encounters:  10/13/14 65.772 kg (145 lb)  04/29/13 69.4 kg (153 lb)  04/16/13 68.493 kg (151 lb)    General:  Appears anxious Eyes: PERRL, normal lids ENT: grossly normal hearing, lips & tongue Neck: no LAD, masses or thyromegaly Cardiovascular: RRR, no m/r/g. No LE edema Respiratory: CTA bilaterally, no w/r/r. Normal respiratory effort. Abdomen: soft, ntnd Skin: Left upper arm noted to be swollen and red with induration and tenderness Musculoskeletal: grossly normal tone BUE/BLE Psychiatric: anxious Neurologic: grossly non-focal.          Labs on Admission:  Basic Metabolic Panel:  Recent Labs Lab 10/13/14 1840  NA 132*  K 3.4*  CL 95*  CO2 26  GLUCOSE 113*  BUN 10  CREATININE 0.96  CALCIUM 9.2   Liver Function Tests:  Recent Labs Lab 10/13/14 1840  AST 16  ALT 12*  ALKPHOS 76  BILITOT 0.8  PROT 8.1  ALBUMIN 4.0   No results for input(s): LIPASE, AMYLASE in the last 168 hours. No results for input(s): AMMONIA in the last 168 hours. CBC:  Recent Labs Lab 10/13/14 1840  WBC 15.3*  NEUTROABS 11.4*  HGB 14.7  HCT 42.8  MCV 83.3  PLT 234   Cardiac Enzymes: No results for input(s): CKTOTAL, CKMB, CKMBINDEX, TROPONINI in the last 168 hours.  BNP (last 3 results) No results for input(s): BNP in the last 8760 hours.  ProBNP (last 3 results) No results for input(s): PROBNP in the last 8760  hours.  CBG: No results for input(s): GLUCAP in the last 168 hours.  Radiological Exams on Admission: Dg Elbow Complete Left  10/13/2014   CLINICAL DATA:  Left elbow pain and swelling, with erythema, for 4 days. Initial encounter.  EXAM: LEFT ELBOW - COMPLETE 3+ VIEW  COMPARISON:  None.  FINDINGS: There is no evidence of fracture or dislocation. The visualized joint spaces are preserved. No significant joint effusion is identified. Diffuse soft tissue swelling is noted about the distal arm and proximal forearm.  IMPRESSION: No evidence of fracture or dislocation.   Electronically Signed   By: Roanna Raider M.D.   On: 10/13/2014 22:08      Assessment/Plan Principal Problem:   Sepsis due to cellulitis Active Problems:   Heroin abuse   Cellulitis of left upper arm   Hypokalemia   Hyponatremia   1. Possible Sepsis due to Cellulitis with Hypotension -initial presentation had hypotension -patient has been evaluated by ortho and he will be taken to the OR tonight -will start on coverage for Cellulitis with PCN allergy -ortho planning for surgery tonight  2. Heroin Abuse -monitor for withdrawl  3. Hypokalemia -will replete with KCL as needed currently he is going to OR  4. Hyponatremia -will repeat labs in am -started on IVF in ED  Code Status: full code (must indicate code status--if unknown or must be presumed, indicate so) DVT Prophylaxis:SCD Family Communication: none (indicate person spoken with, if applicable, with phone number if by telephone) Disposition Plan: Home (indicate anticipated LOS)  Time spent:  [facility] A [facility] Pager [phone]

## 2014-10-13 NOTE — H&P (Signed)
Brendan Preston is an 39 y.o. male.   Chief Complaint: left elbow infection HPI: 39 yo rhd male states he used IV drugs on Sunday night.  Next day began to have swelling and pain in elbow in area of injection.  This has progressively worsened.  Today with swollen, erythematous elbow.  Measured fevers but does not feel feverish.  No chills, night sweats.  Does not feel ill.   Past Medical History  Diagnosis Date  . Substance abuse   . Anxiety     Past Surgical History  Procedure Laterality Date  . Hernia repair      History reviewed. No pertinent family history. Social History:  reports that he has been smoking.  He has never used smokeless tobacco. He reports that he drinks alcohol. He reports that he uses illicit drugs (Marijuana).  Allergies:  Allergies  Allergen Reactions  . Penicillins     unknown     (Not in a hospital admission)  Results for orders placed or performed during the hospital encounter of 10/13/14 (from the past 48 hour(s))  CBC WITH DIFFERENTIAL     Status: Abnormal   Collection Time: 10/13/14  6:40 PM  Result Value Ref Range   WBC 15.3 (H) 4.0 - 10.5 K/uL   RBC 5.14 4.22 - 5.81 MIL/uL   Hemoglobin 14.7 13.0 - 17.0 g/dL   HCT 42.8 39.0 - 52.0 %   MCV 83.3 78.0 - 100.0 fL   MCH 28.6 26.0 - 34.0 pg   MCHC 34.3 30.0 - 36.0 g/dL   RDW 14.0 11.5 - 15.5 %   Platelets 234 150 - 400 K/uL   Neutrophils Relative % 74 43 - 77 %   Neutro Abs 11.4 (H) 1.7 - 7.7 K/uL   Lymphocytes Relative 19 12 - 46 %   Lymphs Abs 2.9 0.7 - 4.0 K/uL   Monocytes Relative 7 3 - 12 %   Monocytes Absolute 1.1 (H) 0.1 - 1.0 K/uL   Eosinophils Relative 0 0 - 5 %   Eosinophils Absolute 0.0 0.0 - 0.7 K/uL   Basophils Relative 0 0 - 1 %   Basophils Absolute 0.0 0.0 - 0.1 K/uL  Comprehensive metabolic panel     Status: Abnormal   Collection Time: 10/13/14  6:40 PM  Result Value Ref Range   Sodium 132 (L) 135 - 145 mmol/L   Potassium 3.4 (L) 3.5 - 5.1 mmol/L   Chloride 95 (L) 101 -  111 mmol/L   CO2 26 22 - 32 mmol/L   Glucose, Bld 113 (H) 65 - 99 mg/dL   BUN 10 6 - 20 mg/dL   Creatinine, Ser 0.96 0.61 - 1.24 mg/dL   Calcium 9.2 8.9 - 10.3 mg/dL   Total Protein 8.1 6.5 - 8.1 g/dL   Albumin 4.0 3.5 - 5.0 g/dL   AST 16 15 - 41 U/L   ALT 12 (L) 17 - 63 U/L   Alkaline Phosphatase 76 38 - 126 U/L   Total Bilirubin 0.8 0.3 - 1.2 mg/dL   GFR calc non Af Amer >60 >60 mL/min   GFR calc Af Amer >60 >60 mL/min    Comment: (NOTE) The eGFR has been calculated using the CKD EPI equation. This calculation has not been validated in all clinical situations. eGFR's persistently <60 mL/min signify possible Chronic Kidney Disease.    Anion gap 11 5 - 15  Urinalysis with microscopic     Status: Abnormal   Collection Time: 10/13/14  6:53 PM  Result Value Ref Range   Color, Urine AMBER (A) YELLOW    Comment: BIOCHEMICALS MAY BE AFFECTED BY COLOR   APPearance CLOUDY (A) CLEAR   Specific Gravity, Urine 1.028 1.005 - 1.030   pH 5.0 5.0 - 8.0   Glucose, UA NEGATIVE NEGATIVE mg/dL   Hgb urine dipstick TRACE (A) NEGATIVE   Bilirubin Urine MODERATE (A) NEGATIVE   Ketones, ur 15 (A) NEGATIVE mg/dL   Protein, ur 100 (A) NEGATIVE mg/dL   Urobilinogen, UA 0.2 0.0 - 1.0 mg/dL   Nitrite NEGATIVE NEGATIVE   Leukocytes, UA NEGATIVE NEGATIVE  Urine microscopic-add on     Status: Abnormal   Collection Time: 10/13/14  6:53 PM  Result Value Ref Range   Squamous Epithelial / LPF RARE RARE   WBC, UA 0-2 <3 WBC/hpf   RBC / HPF 0-2 <3 RBC/hpf   Bacteria, UA RARE RARE   Casts HYALINE CASTS (A) NEGATIVE   Urine-Other MUCOUS PRESENT   I-Stat CG4 Lactic Acid, ED     Status: None   Collection Time: 10/13/14  9:22 PM  Result Value Ref Range   Lactic Acid, Venous 1.54 0.5 - 2.0 mmol/L    Dg Elbow Complete Left  10/13/2014   CLINICAL DATA:  Left elbow pain and swelling, with erythema, for 4 days. Initial encounter.  EXAM: LEFT ELBOW - COMPLETE 3+ VIEW  COMPARISON:  None.  FINDINGS: There is no  evidence of fracture or dislocation. The visualized joint spaces are preserved. No significant joint effusion is identified. Diffuse soft tissue swelling is noted about the distal arm and proximal forearm.  IMPRESSION: No evidence of fracture or dislocation.   Electronically Signed   By: Garald Balding M.D.   On: 10/13/2014 22:08     A comprehensive review of systems was negative.  Blood pressure 122/75, pulse 81, temperature 100.1 F (37.8 C), temperature source Oral, resp. rate 16, height '5\' 11"'  (1.803 m), weight 65.772 kg (145 lb), SpO2 97 %.  General appearance: alert, cooperative and appears stated age Head: Normocephalic, without obvious abnormality, atraumatic Neck: supple, symmetrical, trachea midline Resp: clear to auscultation bilaterally Cardio: regular rate and rhythm GI: non tender Extremities: intact sensation and capillary refill all digits.  +epl/fpl/io.  left ue: swelling and erythema at antecubital fossa.  swollen in rest of elbow without erythema dorsally.  very ttp volarly.  non tender dorsally, at [facility] joint and at lateral soft space.  joint not swollen at lateral side.  pain with motion mostly in volar tissues.  able to passively range ~ 40 degress pain free.  no proximal streaking.  evidence of injection at [facility] fossa. Pulses: 2+ and symmetric Skin: Skin color, texture, turgor normal. No rashes or lesions Neurologic: Grossly normal Incision/Wound: none  Assessment/Plan Left antecubital fossa abscess/cellulitis.  Recommend OR for incision and drainage left arm.  Risks, benefits, and alternatives of surgery were discussed and the patient agrees with the plan of care.   Nikkia Devoss R 10/13/2014, 11:04 PM

## 2014-10-13 NOTE — ED Provider Notes (Signed)
CSN: [id number]     Arrival date & time 10/13/14  1745 History   First MD Initiated Contact with Patient 10/13/14 2047     Chief Complaint  Patient presents with  . Fever  . Joint Swelling     (Consider location/radiation/quality/duration/timing/severity/associated sxs/prior Treatment) HPI Comments: L elbow pain, worsening for past 3 days. Swelling and fever began today.  Patient is a 39 y.o. male presenting with fever and extremity pain. The history is provided by the patient.  Fever Max temp prior to arrival:  Not checked prior to arrival Temp source:  Subjective Severity:  Moderate Onset quality:  Gradual Duration:  2 days Timing:  Constant Progression:  Unchanged Chronicity:  New Associated symptoms: no chest pain, no cough and no vomiting   Extremity Pain This is a new problem. The current episode started more than 2 days ago. The problem occurs constantly. The problem has not changed since onset.Pertinent negatives include no chest pain, no abdominal pain and no shortness of breath. Nothing aggravates the symptoms. Nothing relieves the symptoms.    Past Medical History  Diagnosis Date  . Substance abuse   . Anxiety    Past Surgical History  Procedure Laterality Date  . Hernia repair     No family history on file. History  Substance Use Topics  . Smoking status: Current Every Day Smoker  . Smokeless tobacco: Never Used  . Alcohol Use: Yes     Comment: occ    Review of Systems  Respiratory: Negative for cough and shortness of breath.   Cardiovascular: Negative for chest pain and leg swelling.  Gastrointestinal: Negative for vomiting and abdominal pain.  All other systems reviewed and are negative.     Allergies  Penicillins  Home Medications   Prior to Admission medications   Medication Sig Start Date End Date Taking? Authorizing Provider  benzonatate (TESSALON) 100 MG capsule Take 1-2 capsules (100-200 mg total) by mouth 3 (three) times daily as  needed for cough. 04/16/13   Collene GobbleSteven A Daub, MD  levofloxacin (LEVAQUIN) 500 MG tablet Take 1 tablet (500 mg total) by mouth daily. 04/29/13   Sondra Bargesyan M Dunn, PA-C  promethazine-dextromethorphan (PROMETHAZINE-DM) 6.25-15 MG/5ML syrup Take 5 mLs by mouth 4 (four) times daily as needed for cough. 04/29/13   Ryan M Dunn, PA-C   BP 120/70 mmHg  Pulse 81  Temp(Src) 100.1 F (37.8 C) (Oral)  Resp 16  Ht 5\' 11"  (1.803 m)  Wt 145 lb (65.772 kg)  BMI 20.23 kg/m2  SpO2 98% Physical Exam  Constitutional: He is oriented to person, place, and time. He appears well-developed and well-nourished. No distress.  HENT:  Head: Normocephalic and atraumatic.  Mouth/Throat: No oropharyngeal exudate.  Eyes: EOM are normal. Pupils are equal, round, and reactive to light.  Neck: Normal range of motion. Neck supple.  Cardiovascular: Normal rate and regular rhythm.  Exam reveals no friction rub.   No murmur heard. Pulmonary/Chest: Effort normal and breath sounds normal. No respiratory distress. He has no wheezes. He has no rales.  Abdominal: He exhibits no distension. There is no tenderness. There is no rebound.  Musculoskeletal: He exhibits no edema.       Left elbow: He exhibits decreased range of motion, swelling and effusion. Tenderness (diffuse) found.  Neurological: He is alert and oriented to person, place, and time.  Skin: He is not diaphoretic.  Nursing note and vitals reviewed.   ED Course  Procedures (including critical care time) Labs Review Labs Reviewed  CBC WITH DIFFERENTIAL/PLATELET - Abnormal; Notable for the following:    WBC 15.3 (*)    Neutro Abs 11.4 (*)    Monocytes Absolute 1.1 (*)    All other components within normal limits  COMPREHENSIVE METABOLIC PANEL - Abnormal; Notable for the following:    Sodium 132 (*)    Potassium 3.4 (*)    Chloride 95 (*)    Glucose, Bld 113 (*)    ALT 12 (*)    All other components within normal limits  URINALYSIS, ROUTINE W REFLEX MICROSCOPIC (NOT AT  [facility]) - Abnormal; Notable for the following:    Color, Urine AMBER (*)    APPearance CLOUDY (*)    Hgb urine dipstick TRACE (*)    Bilirubin Urine MODERATE (*)    Ketones, ur 15 (*)    Protein, ur 100 (*)    All other components within normal limits  URINE MICROSCOPIC-ADD ON - Abnormal; Notable for the following:    Casts HYALINE CASTS (*)    All other components within normal limits  CULTURE, BLOOD (ROUTINE X 2)  CULTURE, BLOOD (ROUTINE X 2)  URINE CULTURE  I-STAT CG4 LACTIC ACID, ED    Imaging Review Dg Elbow Complete Left  10/13/2014   CLINICAL DATA:  Left elbow pain and swelling, with erythema, for 4 days. Initial encounter.  EXAM: LEFT ELBOW - COMPLETE 3+ VIEW  COMPARISON:  None.  FINDINGS: There is no evidence of fracture or dislocation. The visualized joint spaces are preserved. No significant joint effusion is identified. Diffuse soft tissue swelling is noted about the distal arm and proximal forearm.  IMPRESSION: No evidence of fracture or dislocation.   Electronically Signed   By: Roanna Raider M.D.   On: 10/13/2014 22:08     EKG Interpretation None      EMERGENCY DEPARTMENT US SOFT TISSUE INTERPRETATION "Study: Limited Ultrasound of the noted body part in comments below"  INDICATIONS: Soft tissue infection Multiple views of the body part are obtained with a multi-frequency linear probe  PERFORMED BY:  Myself  IMAGES ARCHIVED?: Yes  SIDE:Left  BODY PART:Upper extremity  FINDINGS: No abcess noted and Cellulitis present  LIMITATIONS:  none  INTERPRETATION:  No abcess noted and Cellulitis present  COMMENT:  L elbow, AC fossa    MDM   Final diagnoses:  Infection  Cellulitis of antecubital fossa    39 year old male here with left elbow swelling and redness. He has a fever here. Decreased range of motion. No other systemic symptoms. He denies drug abuse, however he has history of IV heroin use noted in the computer. His left elbow is very red and swollen.  He has decreased range of motion. He has large joint effusion. I am concerned he has septic arthritis of the left elbow. He has a white count here 15. Blood cultures obtained. We'll give vancomycin. Will consult orthopedics for septic arthritis.  Dr. Merlyn Lot with hand surgery will see in consult. Vancomycin given, Dr. Merlyn Lot requested medicine to admit. Patient did report to me IV heroin use because he was going through withdrawals from his methadone clinic.    Elwin Mocha, MD 10/14/14 [phone]

## 2014-10-14 ENCOUNTER — Inpatient Hospital Stay (HOSPITAL_COMMUNITY): Payer: Self-pay

## 2014-10-14 ENCOUNTER — Encounter (HOSPITAL_COMMUNITY): Payer: Self-pay | Admitting: Orthopedic Surgery

## 2014-10-14 DIAGNOSIS — F1110 Opioid abuse, uncomplicated: Secondary | ICD-10-CM

## 2014-10-14 DIAGNOSIS — A419 Sepsis, unspecified organism: Principal | ICD-10-CM

## 2014-10-14 DIAGNOSIS — L03114 Cellulitis of left upper limb: Secondary | ICD-10-CM

## 2014-10-14 DIAGNOSIS — L0390 Cellulitis, unspecified: Secondary | ICD-10-CM

## 2014-10-14 DIAGNOSIS — E871 Hypo-osmolality and hyponatremia: Secondary | ICD-10-CM

## 2014-10-14 DIAGNOSIS — E876 Hypokalemia: Secondary | ICD-10-CM

## 2014-10-14 LAB — DIFFERENTIAL
BASOS ABS: 0 10*3/uL (ref 0.0–0.1)
BASOS PCT: 0 % (ref 0–1)
Eosinophils Absolute: 0 10*3/uL (ref 0.0–0.7)
Eosinophils Relative: 0 % (ref 0–5)
LYMPHS PCT: 17 % (ref 12–46)
Lymphs Abs: 2.2 10*3/uL (ref 0.7–4.0)
Monocytes Absolute: 1 10*3/uL (ref 0.1–1.0)
Monocytes Relative: 8 % (ref 3–12)
NEUTROS ABS: 9.9 10*3/uL — AB (ref 1.7–7.7)
NEUTROS PCT: 75 % (ref 43–77)

## 2014-10-14 LAB — COMPREHENSIVE METABOLIC PANEL
ALBUMIN: 3 g/dL — AB (ref 3.5–5.0)
ALK PHOS: 58 U/L (ref 38–126)
ALT: 8 U/L — ABNORMAL LOW (ref 17–63)
AST: 15 U/L (ref 15–41)
Anion gap: 9 (ref 5–15)
BILIRUBIN TOTAL: 0.7 mg/dL (ref 0.3–1.2)
BUN: 5 mg/dL — AB (ref 6–20)
CALCIUM: 7.9 mg/dL — AB (ref 8.9–10.3)
CO2: 23 mmol/L (ref 22–32)
Chloride: 101 mmol/L (ref 101–111)
Creatinine, Ser: 0.82 mg/dL (ref 0.61–1.24)
GFR calc Af Amer: >60 mL/min (ref >60)
GFR calc non Af Amer: >60 mL/min (ref >60)
Glucose, Bld: 143 mg/dL — ABNORMAL HIGH (ref 65–99)
Potassium: 3.7 mmol/L (ref 3.5–5.1)
Sodium: 133 mmol/L — ABNORMAL LOW (ref 135–145)
Total Protein: 5.5 g/dL — ABNORMAL LOW (ref 6.5–8.1)

## 2014-10-14 LAB — URINALYSIS, ROUTINE W REFLEX MICROSCOPIC
Bilirubin Urine: NEGATIVE
GLUCOSE, UA: NEGATIVE mg/dL
HGB URINE DIPSTICK: NEGATIVE
Ketones, ur: NEGATIVE mg/dL
Leukocytes, UA: NEGATIVE
Nitrite: NEGATIVE
PROTEIN: NEGATIVE mg/dL
Specific Gravity, Urine: 1.008 (ref 1.005–1.030)
Urobilinogen, UA: 0.2 mg/dL (ref 0.0–1.0)
pH: 7.5 (ref 5.0–8.0)

## 2014-10-14 LAB — PROTIME-INR
INR: 1.3 (ref 0.00–1.49)
Prothrombin Time: 16.3 s — ABNORMAL HIGH (ref 11.6–15.2)

## 2014-10-14 LAB — TSH: TSH: 1.898 u[IU]/mL (ref 0.350–4.500)

## 2014-10-14 LAB — CBC
HCT: 35.7 % — ABNORMAL LOW (ref 39.0–52.0)
Hemoglobin: 11.9 g/dL — ABNORMAL LOW (ref 13.0–17.0)
MCH: 27.7 pg (ref 26.0–34.0)
MCHC: 33.3 g/dL (ref 30.0–36.0)
MCV: 83 fL (ref 78.0–100.0)
Platelets: 194 10*3/uL (ref 150–400)
RBC: 4.3 MIL/uL (ref 4.22–5.81)
RDW: 14 % (ref 11.5–15.5)
WBC: 13.2 10*3/uL — ABNORMAL HIGH (ref 4.0–10.5)

## 2014-10-14 LAB — APTT: APTT: 33 s (ref 24–37)

## 2014-10-14 LAB — LACTIC ACID, PLASMA: Lactic Acid, Venous: 1.8 mmol/L (ref 0.5–2.0)

## 2014-10-14 LAB — MRSA PCR SCREENING: MRSA by PCR: NEGATIVE

## 2014-10-14 LAB — PROCALCITONIN: Procalcitonin: 0.13 ng/mL

## 2014-10-14 MED ORDER — HYDROMORPHONE HCL 1 MG/ML IJ SOLN
1.0000 mg | Freq: Once | INTRAMUSCULAR | Status: AC
  Filled 2014-10-14: qty 1

## 2014-10-14 MED ORDER — OXYCODONE HCL 5 MG PO TABS: 30.0000 mg | ORAL_TABLET | Freq: Three times a day (TID) | ORAL | Status: DC

## 2014-10-14 MED ORDER — ACETAMINOPHEN 650 MG RE SUPP
650.0000 mg | Freq: Four times a day (QID) | RECTAL | Status: DC | PRN
Start: 2014-10-14 — End: 2014-10-16

## 2014-10-14 MED ORDER — HYDROMORPHONE HCL 1 MG/ML IJ SOLN
0.2500 mg | INTRAMUSCULAR | Status: DC | PRN
  Administered 2014-10-14 (×2): 0.5 mg via INTRAVENOUS

## 2014-10-14 MED ORDER — FOLIC ACID 1 MG PO TABS
1.0000 mg | ORAL_TABLET | Freq: Every day | ORAL | Status: DC
  Administered 2014-10-14 – 2014-10-16 (×3): 1 mg via ORAL
  Filled 2014-10-14 (×4): qty 1

## 2014-10-14 MED ORDER — OXYCODONE HCL 5 MG PO TABS
10.0000 mg | ORAL_TABLET | Freq: Three times a day (TID) | ORAL | Status: DC
  Administered 2014-10-14: 10 mg via ORAL
  Filled 2014-10-14: qty 2

## 2014-10-14 MED ORDER — CARISOPRODOL 350 MG PO TABS
350.0000 mg | ORAL_TABLET | Freq: Every day | ORAL | Status: DC
  Administered 2014-10-14 – 2014-10-15 (×3): 350 mg via ORAL
  Filled 2014-10-14 (×3): qty 1

## 2014-10-14 MED ORDER — VITAMIN B-1 100 MG PO TABS
100.0000 mg | ORAL_TABLET | Freq: Every day | ORAL | Status: DC
  Administered 2014-10-14 – 2014-10-16 (×3): 100 mg via ORAL
  Filled 2014-10-14 (×4): qty 1

## 2014-10-14 MED ORDER — VANCOMYCIN HCL IN DEXTROSE 1-5 GM/200ML-% IV SOLN
1000.0000 mg | Freq: Two times a day (BID) | INTRAVENOUS | Status: DC
Start: 2014-10-14 — End: 2014-10-14
  Administered 2014-10-14: 1000 mg via INTRAVENOUS
  Filled 2014-10-14 (×2): qty 200

## 2014-10-14 MED ORDER — VANCOMYCIN HCL 10 G IV SOLR
1500.0000 mg | Freq: Two times a day (BID) | INTRAVENOUS | Status: DC
  Administered 2014-10-14 – 2014-10-15 (×3): 1500 mg via INTRAVENOUS
  Filled 2014-10-14 (×5): qty 1500

## 2014-10-14 MED ORDER — ADULT MULTIVITAMIN W/MINERALS CH
1.0000 | ORAL_TABLET | Freq: Every day | ORAL | Status: DC
  Administered 2014-10-14 – 2014-10-16 (×3): 1 via ORAL
  Filled 2014-10-14 (×4): qty 1

## 2014-10-14 MED ORDER — OXYCODONE HCL 5 MG PO TABS: 10.0000 mg | ORAL_TABLET | Freq: Three times a day (TID) | ORAL | Status: DC | PRN

## 2014-10-14 MED ORDER — VANCOMYCIN HCL IN DEXTROSE 1-5 GM/200ML-% IV SOLN: 1000.0000 mg | Freq: Once | INTRAVENOUS | Status: DC

## 2014-10-14 MED ORDER — BUPIVACAINE HCL (PF) 0.25 % IJ SOLN
INTRAMUSCULAR | Status: DC | PRN
  Administered 2014-10-14: 10 mL

## 2014-10-14 MED ORDER — KETOROLAC TROMETHAMINE 30 MG/ML IJ SOLN
30.0000 mg | Freq: Four times a day (QID) | INTRAMUSCULAR | Status: DC | PRN
  Administered 2014-10-14 – 2014-10-16 (×4): 30 mg via INTRAVENOUS
  Filled 2014-10-14 (×4): qty 1

## 2014-10-14 MED ORDER — MORPHINE SULFATE 2 MG/ML IJ SOLN
2.0000 mg | INTRAMUSCULAR | Status: DC | PRN
  Administered 2014-10-14 (×2): 2 mg via INTRAVENOUS
  Filled 2014-10-14 (×2): qty 1

## 2014-10-14 MED ORDER — DEXTROSE 5 % IV SOLN
2.0000 g | Freq: Once | INTRAVENOUS | Status: AC
  Administered 2014-10-14: 2 g via INTRAVENOUS
  Filled 2014-10-14: qty 2

## 2014-10-14 MED ORDER — HYDROMORPHONE HCL 1 MG/ML IJ SOLN
INTRAMUSCULAR | Status: AC
  Filled 2014-10-14: qty 1

## 2014-10-14 MED ORDER — CEFTRIAXONE SODIUM IN DEXTROSE 20 MG/ML IV SOLN
1.0000 g | INTRAVENOUS | Status: DC
  Administered 2014-10-14 – 2014-10-16 (×3): 1 g via INTRAVENOUS
  Filled 2014-10-14 (×3): qty 50

## 2014-10-14 MED ORDER — SODIUM CHLORIDE 0.9 % IR SOLN
Status: DC | PRN
  Administered 2014-10-14: 3000 mL

## 2014-10-14 MED ORDER — ONDANSETRON HCL 4 MG/2ML IJ SOLN: 4.0000 mg | Freq: Once | INTRAMUSCULAR | Status: DC | PRN

## 2014-10-14 MED ORDER — MORPHINE SULFATE ER 15 MG PO TBCR
15.0000 mg | EXTENDED_RELEASE_TABLET | Freq: Two times a day (BID) | ORAL | Status: DC
Start: 2014-10-14 — End: 2014-10-16
  Administered 2014-10-14 – 2014-10-15 (×4): 15 mg via ORAL
  Filled 2014-10-14 (×5): qty 1

## 2014-10-14 MED ORDER — METRONIDAZOLE IN NACL 5-0.79 MG/ML-% IV SOLN
500.0000 mg | Freq: Once | INTRAVENOUS | Status: AC
Start: 2014-10-14 — End: 2014-10-14
  Administered 2014-10-14: 500 mg via INTRAVENOUS
  Filled 2014-10-14: qty 100

## 2014-10-14 MED ORDER — DEXTROSE 5 % IV SOLN
1.0000 g | Freq: Three times a day (TID) | INTRAVENOUS | Status: DC
  Filled 2014-10-14: qty 1

## 2014-10-14 MED ORDER — ENOXAPARIN SODIUM 40 MG/0.4ML ~~LOC~~ SOLN
40.0000 mg | SUBCUTANEOUS | Status: DC
  Administered 2014-10-15 – 2014-10-16 (×2): 40 mg via SUBCUTANEOUS
  Filled 2014-10-14 (×3): qty 0.4

## 2014-10-14 MED ORDER — METRONIDAZOLE IN NACL 5-0.79 MG/ML-% IV SOLN
500.0000 mg | Freq: Three times a day (TID) | INTRAVENOUS | Status: DC
Start: 2014-10-14 — End: 2014-10-15
  Administered 2014-10-14 – 2014-10-15 (×4): 500 mg via INTRAVENOUS
  Filled 2014-10-14 (×6): qty 100

## 2014-10-14 MED ORDER — ACETAMINOPHEN 325 MG PO TABS
650.0000 mg | ORAL_TABLET | Freq: Four times a day (QID) | ORAL | Status: DC | PRN
  Administered 2014-10-14: 650 mg via ORAL
  Filled 2014-10-14: qty 2

## 2014-10-14 MED ORDER — OXYCODONE HCL 5 MG PO TABS
15.0000 mg | ORAL_TABLET | Freq: Three times a day (TID) | ORAL | Status: DC | PRN
  Administered 2014-10-14 – 2014-10-15 (×3): 15 mg via ORAL
  Filled 2014-10-14 (×4): qty 3

## 2014-10-14 MED ORDER — SODIUM CHLORIDE 0.9 % IV BOLUS (SEPSIS)
1000.0000 mL | INTRAVENOUS | Status: AC
  Administered 2014-10-14 (×2): 1000 mL via INTRAVENOUS

## 2014-10-14 MED ORDER — ONDANSETRON HCL 4 MG PO TABS: 4.0000 mg | ORAL_TABLET | Freq: Four times a day (QID) | ORAL | Status: DC | PRN

## 2014-10-14 MED ORDER — ONDANSETRON HCL 4 MG/2ML IJ SOLN: 4.0000 mg | Freq: Four times a day (QID) | INTRAMUSCULAR | Status: DC | PRN

## 2014-10-14 MED ORDER — MIDAZOLAM HCL 2 MG/2ML IJ SOLN: 2.0000 mg | Freq: Once | INTRAMUSCULAR | Status: DC

## 2014-10-14 NOTE — Transfer of Care (Signed)
Immediate Anesthesia Transfer of Care Note  Patient: Brendan Preston  Procedure(s) Performed: Procedure(s): IRRIGATION AND DEBRIDEMENT EXTREMITY (Left)  Patient Location: PACU  Anesthesia Type:General  Level of Consciousness: awake, alert , oriented and patient cooperative  Airway & Oxygen Therapy: Patient Spontanous Breathing  Post-op Assessment: Report given to RN and Post -op Vital signs reviewed and stable  Post vital signs: Reviewed and stable  Last Vitals:  Filed Vitals:   10/14/14 0530  BP: 119/68  Pulse: 78  Temp: 37.4 C  Resp: 13    Complications: No apparent anesthesia complications

## 2014-10-14 NOTE — Progress Notes (Signed)
ANTIBIOTIC CONSULT NOTE - FOLLOW UP  Pharmacy Consult for Vancomycin/Rocephin/Flagyl Indication: Cellulitis/Sepsis  Allergies  Allergen Reactions  . Penicillins     unknown    Patient Measurements: Height: 5\' 11"  (180.3 cm) Weight: 161 lb (73.029 kg) IBW/kg (Calculated) : 75.3   Vital Signs: Temp: 101 F (38.3 C) (07/08 1043) Temp Source: Oral (07/08 1043) BP: 134/73 mmHg (07/08 1043) Pulse Rate: 87 (07/08 1043) Intake/Output from previous day: 07/07 0701 - 07/08 0700 In: 3253.3 [P.O.:120; I.V.:600; IV Piggyback:2533.3] Out: 2080 [Urine:2075; Blood:5] Intake/Output from this shift: Total I/O In: 830 [P.O.:480; IV Piggyback:350] Out: 250 [Urine:250]  Labs:  Recent Labs  10/13/14 1840 10/14/14 0645  WBC 15.3* 13.2*  HGB 14.7 11.9*  PLT 234 194  CREATININE 0.96 0.82   Estimated Creatinine Clearance: 123.6 mL/min (by C-G formula based on Cr of 0.82). No results for input(s): VANCOTROUGH, VANCOPEAK, VANCORANDOM, GENTTROUGH, GENTPEAK, GENTRANDOM, TOBRATROUGH, TOBRAPEAK, TOBRARND, AMIKACINPEAK, AMIKACINTROU, AMIKACIN in the last 72 hours.   Microbiology: Recent Results (from the past 720 hour(s))  Urine culture     Status: None (Preliminary result)   Collection Time: 10/13/14  6:53 PM  Result Value Ref Range Status   Specimen Description URINE, CLEAN CATCH  Final   Special Requests NONE  Final   Culture CULTURE REINCUBATED FOR BETTER GROWTH  Final   Report Status PENDING  Incomplete  MRSA PCR Screening     Status: None   Collection Time: 10/14/14  8:06 AM  Result Value Ref Range Status   MRSA by PCR NEGATIVE NEGATIVE Final    Comment:        The GeneXpert MRSA Assay (FDA approved for NASAL specimens only), is one component of a comprehensive MRSA colonization surveillance program. It is not intended to diagnose MRSA infection nor to guide or monitor treatment for MRSA infections.     Anti-infectives    Start     Dose/Rate Route Frequency Ordered Stop    10/14/14 1000  vancomycin (VANCOCIN) IVPB 1000 mg/200 mL premix     1,000 mg 200 mL/hr over 60 Minutes Intravenous Every 12 hours 10/14/14 0209     10/14/14 1000  aztreonam (AZACTAM) 1 g in dextrose 5 % 50 mL IVPB  Status:  Discontinued     1 g 100 mL/hr over 30 Minutes Intravenous Every 8 hours 10/14/14 0209 10/14/14 0747   10/14/14 1000  metroNIDAZOLE (FLAGYL) IVPB 500 mg     500 mg 100 mL/hr over 60 Minutes Intravenous Every 8 hours 10/14/14 0209     10/14/14 0800  cefTRIAXone (ROCEPHIN) 1 g in dextrose 5 % 50 mL IVPB - Premix     1 g 100 mL/hr over 30 Minutes Intravenous Every 24 hours 10/14/14 0747     10/14/14 0230  aztreonam (AZACTAM) 2 g in dextrose 5 % 50 mL IVPB     2 g 100 mL/hr over 30 Minutes Intravenous  Once 10/14/14 0201 10/14/14 0305   10/14/14 0230  metroNIDAZOLE (FLAGYL) IVPB 500 mg     500 mg 100 mL/hr over 60 Minutes Intravenous  Once 10/14/14 0201 10/14/14 0421   10/14/14 0215  vancomycin (VANCOCIN) IVPB 1000 mg/200 mL premix  Status:  Discontinued     1,000 mg 200 mL/hr over 60 Minutes Intravenous  Once 10/14/14 0201 10/14/14 0205   10/13/14 2330  vancomycin (VANCOCIN) 1,250 mg in sodium chloride 0.9 % 250 mL IVPB     1,250 mg 166.7 mL/hr over 90 Minutes Intravenous  Once 10/13/14 2301 10/14/14 0106  Assessment: 39 y.o. male with LUE abcess/cellulitis s/p I & D, possible sepsis, for empiric antibiotics Vancomycin 1250 mg IV given in ED at midnight, then started on vancomycin 1g every 12 hours.  Patient also started on Aztreonam 1g every 8 hours, Flagyl 500 mg every 8 hours. Cultures not yet reported.  Patient Antibiotics have been de-escalated to Rocephin/Vancomycin.   Renal function has improved, recommend to adjust vancomycin dosing to 1500 mg every 12 hours.  Rocephin and flagyl dosing appropriate.  Goal of Therapy:  Vancomycin trough level 10-15 mcg/ml  Plan:  Change vancomycin dose to 1500 mg every 12 hours Continue rocephin 1g every 24  hours Continue flagyl 500 mg every 8 hours Continue to monitor   Elige Ko, PharmD, BCPS 10/14/2014,11:33 AM

## 2014-10-14 NOTE — Op Note (Signed)
[id number] 

## 2014-10-14 NOTE — Op Note (Signed)
Brendan Preston, Brendan Preston              ACCOUNT NO.:  [id number]  MEDICAL RECORD NO.:  [id number]  LOCATION:  3S11C                        FACILITY:  [facility]  PHYSICIAN:  Betha LoaKevin Quaneisha Hanisch, MD        DATE OF BIRTH:  19-Dec-1975  DATE OF PROCEDURE:  10/14/2014 DATE OF DISCHARGE:                              OPERATIVE REPORT   PREOPERATIVE DIAGNOSIS:  Left arm abscess.  POSTOPERATIVE DIAGNOSIS:  Left arm abscess.  PROCEDURE:  Incision and drainage of left arm deep abscess.  SURGEON:  Betha LoaKevin Krystianna Soth, MD  ASSISTANT:  None.  ANESTHESIA:  General.  IV FLUIDS:  Per Anesthesia flow sheet.  ESTIMATED BLOOD LOSS:  Minimal.  COMPLICATIONS:  None.  SPECIMENS:  Cultures to micro.  TOURNIQUET TIME:  Thirty six minutes.  DISPOSITION:  Stable to PACU.  INDICATIONS:  Brendan Preston is a 39 year old right-hand dominant male who states 4 days ago, he injected IV drugs in his left arm.  The following day, he began to have increasing pain and swelling of his arm.  This has progressively worsened.  He has difficulty moving his elbow at this time.  He presented to the emergency department where he was evaluated. He had a temperature to 102 and white blood count of over 15.  I was consulted for management of the condition.  On examination, he had intact sensation and capillary refill in the fingertips.  He could flex his IP joint and thumb across the fingers.  He could wiggle his fingers and wrist.  He had limited range of motion of the elbow secondary to pain.  He had more passive range of motion than active.  Arc at approximately 40 degrees, he had pain-free range of motion.  He had swelling and erythema volarly.  I recommended going to the operating room for incision and drainage of the left arm.  Risks, benefits, and alternatives of surgery were discussed including risk of blood loss; infection; damage to nerves, vessels, tendons, ligaments, bone; failure of surgery; need for additional surgery;  complications with wound healing, continued pain, continued infection, need for repeat irrigation and debridement.  He voiced understanding of these risks and elected to proceed.  OPERATIVE COURSE:  After being identified preoperatively by myself, the patient and I agreed upon procedure and site procedure.  Surgical site was marked.  The risks, benefits, and alternatives of surgery were reviewed and wished to proceed.  Surgical consent had been signed. Antibiotics were held for cultures.  He was transported to the operating room and placed on the operating room table in supine position with left upper extremity on arm board.  General anesthesia was induced by Anesthesiology.  Left upper extremity was prepped and draped in normal sterile orthopedic fashion.  Surgical pause was performed between surgeons, anesthesia, operating room staff, and all were in agreement as to the patient, procedure, and site procedure.  Tourniquet to the proximal aspect of the extremity was inflated to 250 mmHg after exsanguination of the limb with Esmarch bandage.  An S-shaped incision was made at the volar radial side of the antecubital fossa where the skin was most indurated.  This was carried into subcutaneous tissues by spreading technique.  Bipolar and  Bovie electrocautery were used to obtain hemostasis.  The soft tissues were spread.  Gross purulence was encountered.  Cultures were taken for aerobes, anaerobes, and Gram stain.  This abscess cavity coursed on the radial side of the antecubital fossa down deep to the extensor musculature.  This was traced down.  Some of the muscle was very friable and appeared nonviable.  This was debrided.  The area was explored.  It was felt that the entire abscess cavity was found.  The wound was copiously irrigated with 3000 mL of sterile saline by cysto tubing.  It was then packed open with 0.5-inch iodoform gauze.  The wound edges were injected with 10 mL of 0.25%  plain Marcaine to aid in postoperative analgesia.  The wound was then dressed with sterile 4x4s, ABD, and wrapped with a Kerlix bandage.  A posterior splint was placed with the elbow slightly extended from 90 degrees.  This was wrapped with Kerlix and Ace bandage. Tourniquet was deflated at 36 minutes.  Fingertips were pink with brisk capillary refill after deflation of tourniquet.  The operative drapes were broken down.  The patient was awoken from anesthesia safely.  He was transferred back to stretcher and taken to PACU in stable condition. He will be kept for IV antibiotics.  He was given a dose of IV vancomycin in the operating room after cultures had been taken.  We will start hydrotherapy in 2-3 days.  If he is discharged before that, we will continue this on an outpatient setting.     Betha Loa, MD     KK/MEDQ  D:  10/14/2014  T:  10/14/2014  Job:  [id number]

## 2014-10-14 NOTE — Anesthesia Postprocedure Evaluation (Signed)
  Anesthesia Post-op Note  Patient: Brendan Preston  Procedure(s) Performed: Procedure(s): IRRIGATION AND DEBRIDEMENT EXTREMITY (Left)  Patient Location: PACU  Anesthesia Type:General  Level of Consciousness: awake, alert  and oriented  Airway and Oxygen Therapy: Patient Spontanous Breathing and Patient connected to nasal cannula oxygen  Post-op Pain: mild  Post-op Assessment: Post-op Vital signs reviewed, Patient's Cardiovascular Status Stable, Respiratory Function Stable, Patent Airway and Pain level controlled              Post-op Vital Signs: stable  Last Vitals:  Filed Vitals:   10/14/14 0056  BP: 106/58  Pulse: 71  Temp: 36.9 C  Resp: 14    Complications: No apparent anesthesia complications

## 2014-10-14 NOTE — Progress Notes (Signed)
ANTIBIOTIC CONSULT NOTE - INITIAL  Pharmacy Consult for Vancomycin/Aztreonam/Flagyl Indication: cellulitis/sepsis  Allergies  Allergen Reactions  . Penicillins     unknown    Patient Measurements: Height: 5\' 11"  (180.3 cm) Weight: 143 lb 1.3 oz (64.9 kg) IBW/kg (Calculated) : 75.3  Vital Signs: Temp: 99.8 F (37.7 C) (07/08 0158) Temp Source: Oral (07/08 0158) BP: 120/73 mmHg (07/08 0158) Pulse Rate: 67 (07/08 0158) Intake/Output from previous day: 07/07 0701 - 07/08 0700 In: -  Out: 525 [Urine:525] Intake/Output from this shift: Total I/O In: -  Out: 525 [Urine:525]  Labs:  Recent Labs  10/13/14 1840  WBC 15.3*  HGB 14.7  PLT 234  CREATININE 0.96   Estimated Creatinine Clearance: 93.9 mL/min (by C-G formula based on Cr of 0.96). No results for input(s): VANCOTROUGH, VANCOPEAK, VANCORANDOM, GENTTROUGH, GENTPEAK, GENTRANDOM, TOBRATROUGH, TOBRAPEAK, TOBRARND, AMIKACINPEAK, AMIKACINTROU, AMIKACIN in the last 72 hours.   Microbiology: No results found for this or any previous visit (from the past 720 hour(s)).  Medical History: Past Medical History  Diagnosis Date  . Substance abuse   . Anxiety     Medications:  Prescriptions prior to admission  Medication Sig Dispense Refill Last Dose  . carisoprodol (SOMA) 350 MG tablet Take 350 mg by mouth daily as needed for muscle spasms.   Past Week at Unknown time  . oxycodone (ROXICODONE) 30 MG immediate release tablet Take 30 mg by mouth 3 (three) times daily. Take three times every day per patient   10/13/2014 at Unknown time  . benzonatate (TESSALON) 100 MG capsule Take 1-2 capsules (100-200 mg total) by mouth 3 (three) times daily as needed for cough. (Patient not taking: Reported on 10/13/2014) 40 capsule 0 Not Taking at Unknown time  . levofloxacin (LEVAQUIN) 500 MG tablet Take 1 tablet (500 mg total) by mouth daily. (Patient not taking: Reported on 10/13/2014) 7 tablet 0 Not Taking at Unknown time  .  promethazine-dextromethorphan (PROMETHAZINE-DM) 6.25-15 MG/5ML syrup Take 5 mLs by mouth 4 (four) times daily as needed for cough. (Patient not taking: Reported on 10/13/2014) 120 mL 0 Not Taking at Unknown time   Assessment: 39 y.o. male with LUE abcess/cellulitis s/p I & D, possible sepsis, for empiric antibiotics  Vancomycin 1250 mg IV given in ED at midnight  Goal of Therapy:  Vancomycin trough level 15-20 mcg/ml  Plan:  Vancomycin 1 g IV q12h Aztreonam 1 g IV q8h Flagyl 500 mg IV q8h  Eddie Candlebbott, Quayshaun Hubbert Vernon 10/14/2014,2:05 AM

## 2014-10-14 NOTE — Progress Notes (Signed)
[facility] PROGRESS NOTE  Assessment/Plan: Sepsis due to cellulitis/ Cellulitis of left upper arm: - started empirically on antibiotics, vanc and aztrenam, de-escalated to rocephin and vanc. - ortho consult and I&D of abscess on 7.8.2015. - cont oral narcotics for pain control. - afebrile, leukocytosis improving, cultures pending.  Heroin abuse: - d/c dilauidid, start MS contin, and oxy for pain control. - cont oxy.   Hypokalemia - replete.  Hyponatremia - hypovolemia.   Code Status: full Family Communication: none  Disposition Plan: home in 2-3 days   Consultants:  ortho  Procedures:  I&D 7.8.2016  Antibiotics:  vanc rocephin 7.8.2016   HPI/Subjective: Pain is manageable.  Objective: Filed Vitals:   10/14/14 0330 10/14/14 0400 10/14/14 0430 10/14/14 0530  BP: 122/70 128/69 127/74 119/68  Pulse: 81 74 78 78  Temp:    99.4 F (37.4 C)  TempSrc:    Oral  Resp: Height:      Weight:      SpO2: 94% 100% 98% 98%    Intake/Output Summary (Last 24 hours) at 10/14/14 0747 Last data filed at 10/14/14 0556  Gross per 24 hour  Intake 3253.33 ml  Output   1380 ml  Net 1873.33 ml   Filed Weights   03-Nov-2014 1821 10/14/14 0158  Weight: 65.772 kg (145 lb) 64.9 kg (143 lb 1.3 oz)    Exam:  General: Alert, awake, oriented x3, in no acute distress.  HEENT: No bruits, no goiter.  Heart: Regular rate and rhythm Lungs: Good air movement, clear Abdomen: Soft, nontender, nondistended, positive bowel sounds.  Neuro: Grossly intact, nonfocal.   Data Reviewed: Basic Metabolic Panel:  Recent Labs Lab 2014/11/03 1840 10/14/14 0645  NA 132* 133*  K 3.4* 3.7  CL 95* 101  CO2 26 23  GLUCOSE 113* 143*  BUN 10 5*  CREATININE 0.96 0.82  CALCIUM 9.2 7.9*   Liver Function Tests:  Recent Labs Lab 11/03/2014 1840 10/14/14 0645  AST 16 15  ALT 12* 8*  ALKPHOS 76 58  BILITOT 0.8 0.7  PROT 8.1 5.5*  ALBUMIN 4.0 3.0*   No results for  input(s): LIPASE, AMYLASE in the last 168 hours. No results for input(s): AMMONIA in the last 168 hours. CBC:  Recent Labs Lab 03-Nov-2014 1840 10/14/14 0645  WBC 15.3* 13.2*  NEUTROABS 11.4* 9.9*  HGB 14.7 11.9*  HCT 42.8 35.7*  MCV 83.3 83.0  PLT 234 194   Cardiac Enzymes: No results for input(s): CKTOTAL, CKMB, CKMBINDEX, TROPONINI in the last 168 hours. BNP (last 3 results) No results for input(s): BNP in the last 8760 hours.  ProBNP (last 3 results) No results for input(s): PROBNP in the last 8760 hours.  CBG: No results for input(s): GLUCAP in the last 168 hours.  No results found for this or any previous visit (from the past 240 hour(s)).   Studies: Dg Elbow Complete Left  11-03-2014   CLINICAL DATA:  Left elbow pain and swelling, with erythema, for 4 days. Initial encounter.  EXAM: LEFT ELBOW - COMPLETE 3+ VIEW  COMPARISON:  None.  FINDINGS: There is no evidence of fracture or dislocation. The visualized joint spaces are preserved. No significant joint effusion is identified. Diffuse soft tissue swelling is noted about the distal arm and proximal forearm.  IMPRESSION: No evidence of fracture or dislocation.   Electronically Signed   By: Roanna Raider M.D.   On: Nov 03, 2014 22:08   Dg Chest Port 1 View  10/14/2014  CLINICAL DATA:  Acute onset of fever for 5 days.  Initial encounter.  EXAM: PORTABLE CHEST - 1 VIEW  COMPARISON:  Chest radiograph performed 02/27/2012  FINDINGS: The lungs are well-aerated and clear. There is no evidence of focal opacification, pleural effusion or pneumothorax.  The cardiomediastinal silhouette is within normal limits. No acute osseous abnormalities are seen.  IMPRESSION: No acute cardiopulmonary process seen.   Electronically Signed   By: Roanna RaiderJeffery  Chang M.D.   On: 10/14/2014 02:25    Scheduled Meds: . carisoprodol  350 mg Oral QHS  . cefTRIAXone (ROCEPHIN)  IV  1 g Intravenous Q24H  . [START ON 10/15/2014] enoxaparin (LOVENOX) injection  40 mg  Subcutaneous Q24H  . folic acid  1 mg Oral Daily  . metronidazole  500 mg Intravenous Q8H  . multivitamin with minerals  1 tablet Oral Daily  . thiamine  100 mg Oral Daily  . vancomycin  1,000 mg Intravenous Q12H   Continuous Infusions:   Time Spent: 25 min   Marinda ElkFELIZ ORTIZ, ABRAHAM  [facility] Pager [phone]. If 7PM-7AM, please contact night-coverage at [email or web address], password [email or web address] 10/14/2014, 7:47 AM  LOS: 1 day

## 2014-10-14 NOTE — Progress Notes (Signed)
Subjective: 1 Day Post-Op Procedure(s) (LRB): IRRIGATION AND DEBRIDEMENT EXTREMITY (Left) Patient reports pain as controlled and improving.    Objective: Vital signs in last 24 hours: Temp:  [98.4 F (36.9 C)-102 F (38.9 C)] 98.5 F (36.9 C) (07/08 1438) Pulse Rate:  [65-106] 67 (07/08 1438) Resp:  [13-22] 18 (07/08 1438) BP: (98-145)/(52-90) 113/58 mmHg (07/08 1438) SpO2:  [94 %-100 %] 97 % (07/08 1438) Weight:  [64.9 kg (143 lb 1.3 oz)-73.029 kg (161 lb)] 73.029 kg (161 lb) (07/08 1043)  Intake/Output from previous day: 07/07 0701 - 07/08 0700 In: 3253.3 [P.O.:120; I.V.:600; IV Piggyback:2533.3] Out: 2080 [Urine:2075; Blood:5] Intake/Output this shift: Total I/O In: 1310 [P.O.:960; IV Piggyback:350] Out: 1450 [Urine:1450]   Recent Labs  10/13/14 1840 10/14/14 0645  HGB 14.7 11.9*    Recent Labs  10/13/14 1840 10/14/14 0645  WBC 15.3* 13.2*  RBC 5.14 4.30  HCT 42.8 35.7*  PLT 234 194    Recent Labs  10/13/14 1840 10/14/14 0645  NA 132* 133*  K 3.4* 3.7  CL 95* 101  CO2 26 23  BUN 10 5*  CREATININE 0.96 0.82  GLUCOSE 113* 143*  CALCIUM 9.2 7.9*    Recent Labs  10/14/14 0645  INR 1.30    intact sensation and capillary refill all digits. +epl/fpl/io.  dressing c/d/i.  Assessment/Plan: 1 Day Post-Op Procedure(s) (LRB): IRRIGATION AND DEBRIDEMENT EXTREMITY (Left) Stable post op.  Will start hydrotherapy in next few days.  Continue antibiotics.  Home when afebrile and wbc improved.  Follow up in office next week.  Legend Pecore R 10/14/2014, 5:01 PM

## 2014-10-14 NOTE — Progress Notes (Signed)
Report called ;pt transferring to [id number] via bed with belongings.

## 2014-10-14 NOTE — Progress Notes (Addendum)
Pt refused Oxy IR 30mg . RN clarified pt stating that he takes "10mg  3 times per day for 30mg  total, not 30mg  3 times per day".   Plan: -COWS score Q4H to monitor for heroin withdrawal; nursing to report to on-call if any withdrawal s/s -Lowered Oxy IR from 30mg  to 10mg  tid.   Beau FannyWithrow, Damisha Wolff C, [place]FNP 10/14/2014   ADDENDUM: -Pt crying in bed d/t pain per nursing report. Pain unrelieved by morphing 2mg .  -Give Dilaudid 1mg  IV x 1 dose and re-evaluate pain  Beau FannyWithrow, Dharma Pare C, FNP 10/14/2014   03:15 AM

## 2014-10-14 NOTE — Care Management Note (Signed)
Case Management Note  Patient Details  Name: Gracy RacerJeremy Schrager MRN: [id number] Date of Birth: 17-Jan-1976  Subjective/Objective:                    Action/Plan: UR completed   Expected Discharge Date:                  Expected Discharge Plan:  Home/Self Care  In-House Referral:     Discharge planning Services     Post Acute Care Choice:    Choice offered to:     DME Arranged:    DME Agency:     HH Arranged:    HH Agency:     Status of Service:  In process, will continue to follow  Medicare Important Message Given:    Date Medicare IM Given:    Medicare IM give by:    Date Additional Medicare IM Given:    Additional Medicare Important Message give by:     If discussed at Long Length of Stay Meetings, dates discussed:    Additional Comments:  Kingsley PlanWile, Idonna Heeren Marie, RN 10/14/2014, 11:47 AM

## 2014-10-14 NOTE — Progress Notes (Signed)
Orthopedic Tech Progress Note Patient Details:  Brendan RacerJeremy Preston November 25, 1975 [id number]  Ortho Devices Type of Ortho Device: Other (comment) Ortho Device/Splint Location: kuzma sling Ortho Device/Splint Interventions: Application   Cammer, Mickie BailJennifer Carol 10/14/2014, 2:46 AM

## 2014-10-14 NOTE — Brief Op Note (Signed)
10/13/2014 - 10/14/2014  12:50 AM  PATIENT:  Brendan RacerJeremy Preston  39 y.o. male  PRE-OPERATIVE DIAGNOSIS:   left arm abscess  POST-OPERATIVE DIAGNOSIS:  left arm abscess  PROCEDURE:  Procedure(s): IRRIGATION AND DEBRIDEMENT EXTREMITY (Left) deep abscess  SURGEON:  Surgeon(s) and Role:    * Betha LoaKevin Urie Loughner, MD - Primary  PHYSICIAN ASSISTANT:   ASSISTANTS: none   ANESTHESIA:   general  EBL:     BLOOD ADMINISTERED:none  DRAINS: iodoform packing  LOCAL MEDICATIONS USED:  MARCAINE     SPECIMEN:  Source of Specimen:  left arm  DISPOSITION OF SPECIMEN:  micro  COUNTS:  YES  TOURNIQUET:   Total Tourniquet Time Documented: Upper Arm (Left) - 36 minutes Total: Upper Arm (Left) - 36 minutes   DICTATION: .Other Dictation: Dictation Number [id number]  PLAN OF CARE: Admit to inpatient   PATIENT DISPOSITION:  PACU - hemodynamically stable.   Delay start of Pharmacological VTE agent (>24hrs) due to surgical blood loss or risk of bleeding: no

## 2014-10-15 LAB — CBC
HCT: 38.5 % — ABNORMAL LOW (ref 39.0–52.0)
Hemoglobin: 12.6 g/dL — ABNORMAL LOW (ref 13.0–17.0)
MCH: 27.4 pg (ref 26.0–34.0)
MCHC: 32.7 g/dL (ref 30.0–36.0)
MCV: 83.7 fL (ref 78.0–100.0)
Platelets: 211 10*3/uL (ref 150–400)
RBC: 4.6 MIL/uL (ref 4.22–5.81)
RDW: 14.1 % (ref 11.5–15.5)
WBC: 12.5 10*3/uL — ABNORMAL HIGH (ref 4.0–10.5)

## 2014-10-15 LAB — URINE CULTURE

## 2014-10-15 LAB — HEMOGLOBIN A1C
HEMOGLOBIN A1C: 6 % — AB (ref 4.8–5.6)
MEAN PLASMA GLUCOSE: 126 mg/dL

## 2014-10-15 LAB — GLUCOSE, CAPILLARY: GLUCOSE-CAPILLARY: 119 mg/dL — AB (ref 65–99)

## 2014-10-15 MED ORDER — OXYCODONE HCL 5 MG PO TABS
5.0000 mg | ORAL_TABLET | Freq: Three times a day (TID) | ORAL | Status: DC | PRN
  Administered 2014-10-15 – 2014-10-16 (×3): 5 mg via ORAL
  Filled 2014-10-15 (×3): qty 1

## 2014-10-15 NOTE — Plan of Care (Signed)
Problem: Phase I Progression Outcomes Goal: Pain controlled with appropriate interventions Outcome: Not Progressing Pain meds are being decreased, but his pain stays up to 8.

## 2014-10-15 NOTE — Progress Notes (Signed)
[facility] PROGRESS NOTE  Assessment/Plan: Sepsis due to cellulitis/ Cellulitis of left upper arm: - started empirically on antibiotics, vanc and aztrenam, de-escalated to rocephin, flagyl and vanc. D/c flagyl. - Ortho consult and I&D of abscess on 7.8.2015. - Cont oral narcotics for pain control, will decrease narcotics. - Afebrile, leukocytosis improving, but not resolved, cultures negative till date.  Heroin abuse: - d/c dilauidid, start MS contin, and oxy for pain control.   Hypokalemia - replete.  Hyponatremia - hypovolemia.   Code Status: full Family Communication: none  Disposition Plan: home in 1-2 days  Consultants:  ortho  Procedures:  I&D 7.8.2016  Antibiotics:  vanc rocephin 7.8.2016   HPI/Subjective: Pain is manageable. appetite unchanged  Objective: Filed Vitals:   10/14/14 1842 10/14/14 2147 10/15/14 0124 10/15/14 0511  BP: 125/83 115/59 121/75 101/57  Pulse: 75 68 66 63  Temp: 99 F (37.2 C) 99 F (37.2 C) 99 F (37.2 C) 98.6 F (37 C)  TempSrc: Oral Oral Oral Oral  Resp: Height:      Weight:      SpO2: 99% 98% 100% 97%    Intake/Output Summary (Last 24 hours) at 10/15/14 1038 Last data filed at 10/15/14 0900  Gross per 24 hour  Intake   1320 ml  Output   2875 ml  Net  -1555 ml   Filed Weights   10/13/14 1821 10/14/14 0158 10/14/14 1043  Weight: 65.772 kg (145 lb) 64.9 kg (143 lb 1.3 oz) 73.029 kg (161 lb)    Exam:  General: Alert, awake, oriented x3, in no acute distress.  HEENT: No bruits, no goiter.  Heart: Regular rate and rhythm Lungs: Good air movement, clear Abdomen: Soft, nontender, nondistended, positive bowel sounds.  Neuro: Grossly intact, nonfocal.   Data Reviewed: Basic Metabolic Panel:  Recent Labs Lab 10/13/14 1840 10/14/14 0645  NA 132* 133*  K 3.4* 3.7  CL 95* 101  CO2 26 23  GLUCOSE 113* 143*  BUN 10 5*  CREATININE 0.96 0.82  CALCIUM 9.2 7.9*   Liver Function  Tests:  Recent Labs Lab 10/13/14 1840 10/14/14 0645  AST 16 15  ALT 12* 8*  ALKPHOS 76 58  BILITOT 0.8 0.7  PROT 8.1 5.5*  ALBUMIN 4.0 3.0*   No results for input(s): LIPASE, AMYLASE in the last 168 hours. No results for input(s): AMMONIA in the last 168 hours. CBC:  Recent Labs Lab 10/13/14 1840 10/14/14 0645 10/15/14 0337  WBC 15.3* 13.2* 12.5*  NEUTROABS 11.4* 9.9*  --   HGB 14.7 11.9* 12.6*  HCT 42.8 35.7* 38.5*  MCV 83.3 83.0 83.7  PLT 234 194 211   Cardiac Enzymes: No results for input(s): CKTOTAL, CKMB, CKMBINDEX, TROPONINI in the last 168 hours. BNP (last 3 results) No results for input(s): BNP in the last 8760 hours.  ProBNP (last 3 results) No results for input(s): PROBNP in the last 8760 hours.  CBG:  Recent Labs Lab 10/15/14 0738  GLUCAP 119*    Recent Results (from the past 240 hour(s))  Culture, blood (routine x 2)     Status: None (Preliminary result)   Collection Time: 10/13/14  6:42 PM  Result Value Ref Range Status   Specimen Description BLOOD RIGHT ARM  Final   Special Requests   Final    BOTTLES DRAWN AEROBIC AND ANAEROBIC RED , BLUE   Culture NO GROWTH < 24 HOURS  Final   Report Status PENDING  Incomplete  Culture,  blood (routine x 2)     Status: None (Preliminary result)   Collection Time: 10/13/14  6:45 PM  Result Value Ref Range Status   Specimen Description BLOOD LEFT HAND  Final   Special Requests BOTTLES DRAWN AEROBIC AND ANAEROBIC 5ML  Final   Culture NO GROWTH < 24 HOURS  Final   Report Status PENDING  Incomplete  Urine culture     Status: None   Collection Time: 10/13/14  6:53 PM  Result Value Ref Range Status   Specimen Description URINE, CLEAN CATCH  Final   Special Requests NONE  Final   Culture   Final    MULTIPLE SPECIES PRESENT, SUGGEST RECOLLECTION IF CLINICALLY INDICATED   Report Status 10/15/2014 FINAL  Final  Culture, routine-abscess     Status: None (Preliminary result)   Collection Time: 10/14/14  12:07 AM  Result Value Ref Range Status   Specimen Description ABSCESS LEFT ARM  Final   Special Requests NONE  Final   Gram Stain PENDING  Incomplete   Culture   Final    NO GROWTH 1 DAY [facility]formed at [facility]    Report Status PENDING  Incomplete  MRSA PCR Screening     Status: None   Collection Time: 10/14/14  8:06 AM  Result Value Ref Range Status   MRSA by PCR NEGATIVE NEGATIVE Final    Comment:        The GeneXpert MRSA Assay (FDA approved for NASAL specimens only), is one component of a comprehensive MRSA colonization surveillance program. It is not intended to diagnose MRSA infection nor to guide or monitor treatment for MRSA infections.      Studies: Dg Elbow Complete Left  10/13/2014   CLINICAL DATA:  Left elbow pain and swelling, with erythema, for 4 days. Initial encounter.  EXAM: LEFT ELBOW - COMPLETE 3+ VIEW  COMPARISON:  None.  FINDINGS: There is no evidence of fracture or dislocation. The visualized joint spaces are preserved. No significant joint effusion is identified. Diffuse soft tissue swelling is noted about the distal arm and proximal forearm.  IMPRESSION: No evidence of fracture or dislocation.   Electronically Signed   By: Roanna RaiderJeffery  Chang M.D.   On: 10/13/2014 22:08   Dg Chest Port 1 View  10/14/2014   CLINICAL DATA:  Acute onset of fever for 5 days.  Initial encounter.  EXAM: PORTABLE CHEST - 1 VIEW  COMPARISON:  Chest radiograph performed 02/27/2012  FINDINGS: The lungs are well-aerated and clear. There is no evidence of focal opacification, pleural effusion or pneumothorax.  The cardiomediastinal silhouette is within normal limits. No acute osseous abnormalities are seen.  IMPRESSION: No acute cardiopulmonary process seen.   Electronically Signed   By: Roanna RaiderJeffery  Chang M.D.   On: 10/14/2014 02:25    Scheduled Meds: . carisoprodol  350 mg Oral QHS  . cefTRIAXone (ROCEPHIN)  IV  1 g Intravenous Q24H  . enoxaparin (LOVENOX) injection  40 mg  Subcutaneous Q24H  . folic acid  1 mg Oral Daily  . metronidazole  500 mg Intravenous Q8H  . morphine  15 mg Oral Q12H  . multivitamin with minerals  1 tablet Oral Daily  . thiamine  100 mg Oral Daily  . vancomycin  1,500 mg Intravenous Q12H   Continuous Infusions:   Time Spent: 25 min   Marinda ElkFELIZ ORTIZ, ABRAHAM  [facility] Pager [phone]. If 7PM-7AM, please contact night-coverage at [email or web address], password [email or web address] 10/15/2014, 10:38 AM  LOS: 2 days

## 2014-10-16 LAB — CBC
HCT: 39.1 % (ref 39.0–52.0)
HEMOGLOBIN: 12.7 g/dL — AB (ref 13.0–17.0)
MCH: 28 pg (ref 26.0–34.0)
MCHC: 32.5 g/dL (ref 30.0–36.0)
MCV: 86.1 fL (ref 78.0–100.0)
Platelets: 224 10*3/uL (ref 150–400)
RBC: 4.54 MIL/uL (ref 4.22–5.81)
RDW: 14.3 % (ref 11.5–15.5)
WBC: 9 10*3/uL (ref 4.0–10.5)

## 2014-10-16 LAB — CULTURE, ROUTINE-ABSCESS: Gram Stain: NONE SEEN

## 2014-10-16 LAB — GLUCOSE, CAPILLARY: Glucose-Capillary: 90 mg/dL (ref 65–99)

## 2014-10-16 MED ORDER — HYDROCODONE-ACETAMINOPHEN 7.5-325 MG PO TABS
1.0000 | ORAL_TABLET | Freq: Four times a day (QID) | ORAL | Status: DC | PRN
  Administered 2014-10-16: 1 via ORAL
  Filled 2014-10-16: qty 1

## 2014-10-16 MED ORDER — METRONIDAZOLE 500 MG PO TABS: 500.0000 mg | ORAL_TABLET | Freq: Three times a day (TID) | ORAL | Status: DC

## 2014-10-16 MED ORDER — CIPROFLOXACIN HCL 500 MG PO TABS: 500.0000 mg | ORAL_TABLET | Freq: Two times a day (BID) | ORAL | Status: DC

## 2014-10-16 MED ORDER — HYDROCODONE-ACETAMINOPHEN 7.5-325 MG PO TABS: 1.0000 | ORAL_TABLET | Freq: Four times a day (QID) | ORAL | Status: DC | PRN

## 2014-10-16 MED ORDER — AMOXICILLIN-POT CLAVULANATE 875-125 MG PO TABS
1.0000 | ORAL_TABLET | Freq: Two times a day (BID) | ORAL | Status: DC
  Filled 2014-10-16: qty 1

## 2014-10-16 MED ORDER — AMOXICILLIN-POT CLAVULANATE 875-125 MG PO TABS: 1.0000 | ORAL_TABLET | Freq: Two times a day (BID) | ORAL | Status: DC

## 2014-10-16 MED ORDER — DOXYCYCLINE HYCLATE 100 MG PO TABS
100.0000 mg | ORAL_TABLET | Freq: Two times a day (BID) | ORAL | Status: DC
  Filled 2014-10-16: qty 1

## 2014-10-16 MED ORDER — DOXYCYCLINE HYCLATE 100 MG PO TABS: 100.0000 mg | ORAL_TABLET | Freq: Two times a day (BID) | ORAL | Status: DC

## 2014-10-16 NOTE — Discharge Summary (Addendum)
Physician Discharge Summary  Brendan RacerJeremy Preston ZOX:[id number] DOB: [birth year] DOA: 10/13/2014  PCP: No PCP Per Patient  Admit date: 10/13/2014 Discharge date: 10/16/2014  Time spent: 35 minutes  Recommendations for Outpatient Follow-up:  1. Follow up with ortho as an outpatient 2. Will need referral for hydrotherapy.  Discharge Diagnoses:  Principal Problem:   Sepsis due to cellulitis Active Problems:   Heroin abuse   Cellulitis of left upper arm   Hypokalemia   Hyponatremia   Discharge Condition: stable  Diet recommendation: regular  Filed Weights   10/13/14 1821 10/14/14 0158 10/14/14 1043  Weight: 65.772 kg (145 lb) 64.9 kg (143 lb 1.3 oz) 73.029 kg (161 lb)    History of present illness:  39 y.o. male with history of Heroin Abuse presents to the ED with swelling and pain in the Left upper arm. He has been regularly injecting in the arm. He noticed today that his left arm has been painful and has become swollen with increased redness. Patient states that pain became unbearable so he came into the ED. He has noted a fever to 102F. Patient has felt weak also and in the ED he was initially noted to be HYPOtensive. Patient has no headache he has dizziness and general weakness.  Hospital Course:  Sepsis due to cellulitis/ Cellulitis of left upper arm: - started empirically on antibiotics, vanc and aztrenam, de-escalated to rocephin, flagyl. - Ortho consult and I&D of abscess on 7.8.2015. - Afebrile, leukocytosis improving, but not resolved, cultures grew microaerophilic strep. - home on augmentin for 2 weeks orally for 2 weeks. Will follow up as an outpatient with ortho and refer for hydrotherapy. - short course of narcotics.  Heroin abuse: - no withdrawls. Short course of narcotics as an outpatient.  Hypokalemia - repleted  Hyponatremia - due to to hypovolemia, resolved.  Procedures:  I&D  Consultations:  ortho  Discharge Exam: Filed Vitals:   10/16/14 0459   BP: 104/58  Pulse: 66  Temp: 98.4 F (36.9 C)  Resp: 18    General: A&O x3 Cardiovascular: RRR Respiratory: good air movement CTA B/L  Discharge Instructions   Discharge Instructions    Diet - low sodium heart healthy    Complete by:  As directed      Increase activity slowly    Complete by:  As directed           Current Discharge Medication List    START taking these medications   Details  amoxicillin-clavulanate (AUGMENTIN) 875-125 MG per tablet Take 1 tablet by mouth 2 (two) times daily. Qty: 14 tablet, Refills: 0    HYDROcodone-acetaminophen (NORCO) 7.5-325 MG per tablet Take 1 tablet by mouth every 6 (six) hours as needed for moderate pain. Qty: 30 tablet, Refills: 0      CONTINUE these medications which have NOT CHANGED   Details  carisoprodol (SOMA) 350 MG tablet Take 350 mg by mouth daily as needed for muscle spasms.    benzonatate (TESSALON) 100 MG capsule Take 1-2 capsules (100-200 mg total) by mouth 3 (three) times daily as needed for cough. Qty: 40 capsule, Refills: 0      STOP taking these medications     oxycodone (ROXICODONE) 30 MG immediate release tablet      levofloxacin (LEVAQUIN) 500 MG tablet      promethazine-dextromethorphan (PROMETHAZINE-DM) 6.25-15 MG/5ML syrup        Allergies  Allergen Reactions  . Penicillins     unknown   Follow-up Information  Follow up with Tami Ribas, MD In 1 week.   Specialty:  Orthopedic Surgery   Why:  hospital follow up   Contact information:   [address] [place] [address] [phone]        The results of significant diagnostics from this hospitalization (including imaging, microbiology, ancillary and laboratory) are listed below for reference.    Significant Diagnostic Studies: Dg Elbow Complete Left  Oct 27, 2014   CLINICAL DATA:  Left elbow pain and swelling, with erythema, for 4 days. Initial encounter.  EXAM: LEFT ELBOW - COMPLETE 3+ VIEW  COMPARISON:  None.  FINDINGS: There  is no evidence of fracture or dislocation. The visualized joint spaces are preserved. No significant joint effusion is identified. Diffuse soft tissue swelling is noted about the distal arm and proximal forearm.  IMPRESSION: No evidence of fracture or dislocation.   Electronically Signed   By: Roanna Raider M.D.   On: 10/27/2014 22:08   Dg Chest Port 1 View  10/14/2014   CLINICAL DATA:  Acute onset of fever for 5 days.  Initial encounter.  EXAM: PORTABLE CHEST - 1 VIEW  COMPARISON:  Chest radiograph performed 02/27/2012  FINDINGS: The lungs are well-aerated and clear. There is no evidence of focal opacification, pleural effusion or pneumothorax.  The cardiomediastinal silhouette is within normal limits. No acute osseous abnormalities are seen.  IMPRESSION: No acute cardiopulmonary process seen.   Electronically Signed   By: Roanna Raider M.D.   On: 10/14/2014 02:25    Microbiology: Recent Results (from the past 240 hour(s))  Culture, blood (routine x 2)     Status: None (Preliminary result)   Collection Time: 10/27/2014  6:42 PM  Result Value Ref Range Status   Specimen Description BLOOD RIGHT ARM  Final   Special Requests   Final    BOTTLES DRAWN AEROBIC AND ANAEROBIC RED , BLUE   Culture NO GROWTH 2 DAYS  Final   Report Status PENDING  Incomplete  Culture, blood (routine x 2)     Status: None (Preliminary result)   Collection Time: 10/27/14  6:45 PM  Result Value Ref Range Status   Specimen Description BLOOD LEFT HAND  Final   Special Requests BOTTLES DRAWN AEROBIC AND ANAEROBIC  Final   Culture NO GROWTH 2 DAYS  Final   Report Status PENDING  Incomplete  Urine culture     Status: None   Collection Time: 10/27/14  6:53 PM  Result Value Ref Range Status   Specimen Description URINE, CLEAN CATCH  Final   Special Requests NONE  Final   Culture   Final    MULTIPLE SPECIES PRESENT, SUGGEST RECOLLECTION IF CLINICALLY INDICATED   Report Status 10/15/2014 FINAL  Final  Anaerobic  culture     Status: None (Preliminary result)   Collection Time: 10/14/14 12:07 AM  Result Value Ref Range Status   Specimen Description ABSCESS LEFT ARM  Final   Special Requests NONE  Final   Gram Stain   Final    NO WBC SEEN NO SQUAMOUS EPITHELIAL CELLS SEEN FEW GRAM POSITIVE COCCI IN PAIRS Performed at [facility]    Culture   Final    NO ANAEROBES ISOLATED; CULTURE IN PROGRESS FOR 5 DAYS Performed at [facility]    Report Status PENDING  Incomplete  Culture, routine-abscess     Status: None   Collection Time: 10/14/14 12:07 AM  Result Value Ref Range Status   Specimen Description ABSCESS LEFT ARM  Final   Special Requests NONE  Final   Gram Stain   Final    NO WBC SEEN NO SQUAMOUS EPITHELIAL CELLS SEEN FEW GRAM POSITIVE COCCI IN PAIRS Performed at [facility]    Culture   Final    FEW MICROAEROPHILIC STREPTOCOCCI Note: Standardized susceptibility testing for this organism is not available. Performed at [facility]    Report Status 10/16/2014 FINAL  Final  MRSA PCR Screening     Status: None   Collection Time: 10/14/14  8:06 AM  Result Value Ref Range Status   MRSA by PCR NEGATIVE NEGATIVE Final    Comment:        The GeneXpert MRSA Assay (FDA approved for NASAL specimens only), is one component of a comprehensive MRSA colonization surveillance program. It is not intended to diagnose MRSA infection nor to guide or monitor treatment for MRSA infections.      Labs: Basic Metabolic Panel:  Recent Labs Lab 10/13/14 1840 10/14/14 0645  NA 132* 133*  K 3.4* 3.7  CL 95* 101  CO2 26 23  GLUCOSE 113* 143*  BUN 10 5*  CREATININE 0.96 0.82  CALCIUM 9.2 7.9*   Liver Function Tests:  Recent Labs Lab 10/13/14 1840 10/14/14 0645  AST 16 15  ALT 12* 8*  ALKPHOS 76 58  BILITOT 0.8 0.7  PROT 8.1 5.5*  ALBUMIN 4.0 3.0*   No results for input(s): LIPASE, AMYLASE in the last 168 hours. No results for input(s):  AMMONIA in the last 168 hours. CBC:  Recent Labs Lab 10/13/14 1840 10/14/14 0645 10/15/14 0337 10/16/14 0410  WBC 15.3* 13.2* 12.5* 9.0  NEUTROABS 11.4* 9.9*  --   --   HGB 14.7 11.9* 12.6* 12.7*  HCT 42.8 35.7* 38.5* 39.1  MCV 83.3 83.0 83.7 86.1  PLT 234 194 211 224   Cardiac Enzymes: No results for input(s): CKTOTAL, CKMB, CKMBINDEX, TROPONINI in the last 168 hours. BNP: BNP (last 3 results) No results for input(s): BNP in the last 8760 hours.  ProBNP (last 3 results) No results for input(s): PROBNP in the last 8760 hours.  CBG:  Recent Labs Lab 10/15/14 0738 10/16/14 0756  GLUCAP 119* 90       Signed:  Marinda Elk  [facility] 10/16/2014, 10:30 AM

## 2014-10-16 NOTE — Progress Notes (Signed)
IV removed per order. Discharge instructions and prescriptions given and explained with teach back. Discharged ambulatory to friend's care with RN present.

## 2014-10-18 LAB — CULTURE, BLOOD (ROUTINE X 2)
Culture: NO GROWTH
Culture: NO GROWTH

## 2014-10-19 LAB — ANAEROBIC CULTURE: Gram Stain: NONE SEEN

## 2015-04-24 IMAGING — CR DG CHEST 2V
3 series · 3 of 3 positions shown · non-contrast
Comparison: None.

CLINICAL DATA: 37-year-old male with shortness of breath.

EXAM:
CHEST  2 VIEW

[PA (1 of 2)]
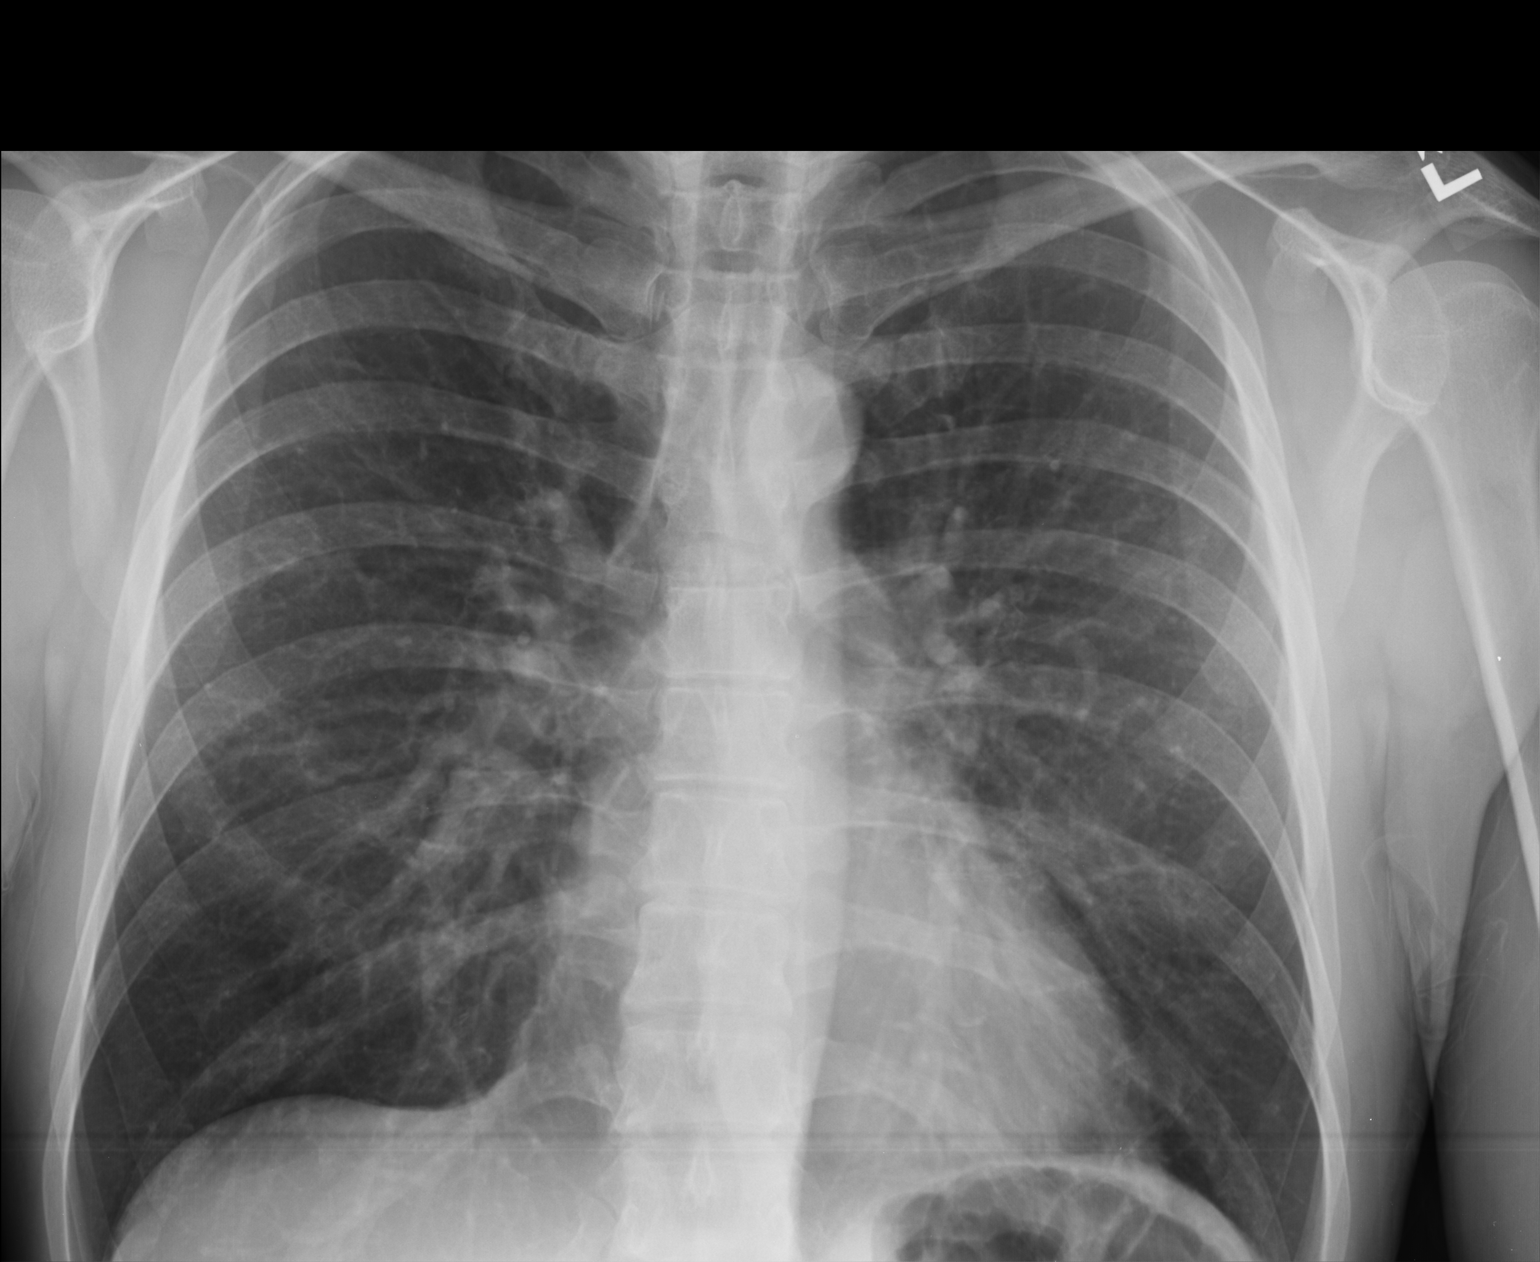

[lateral]
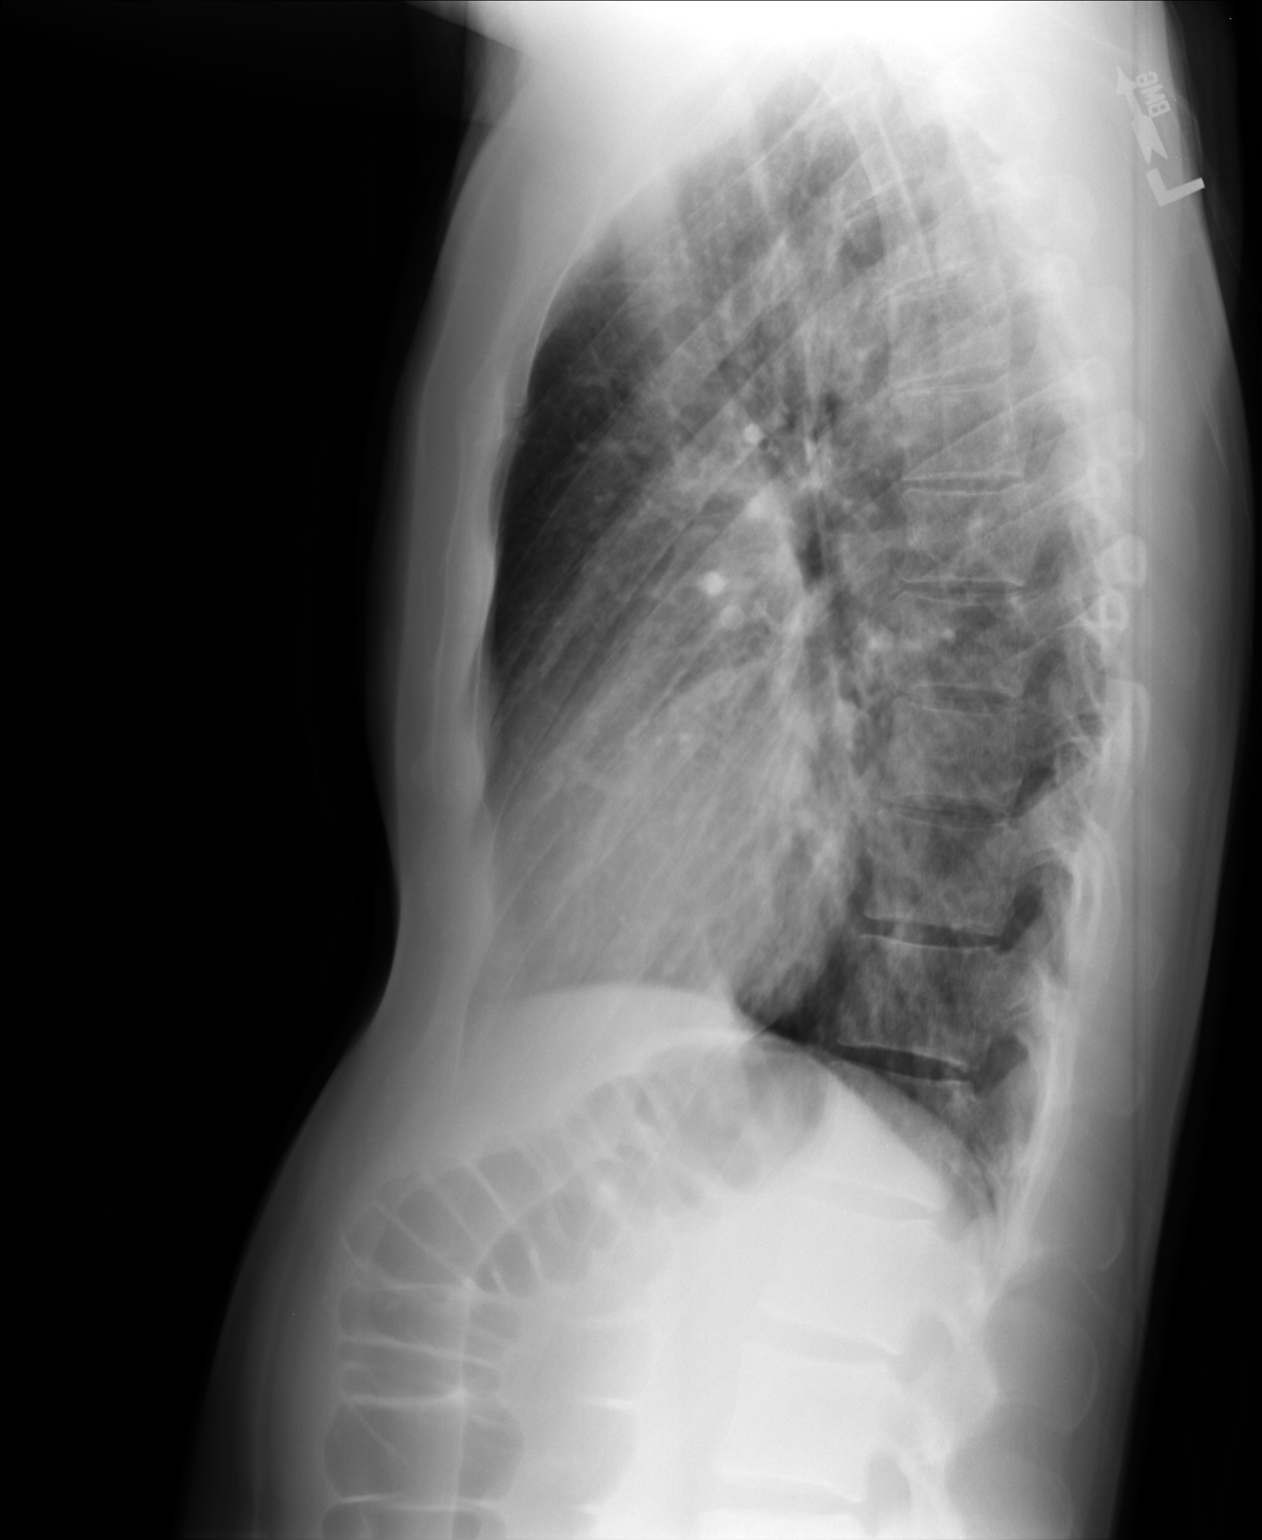

[PA (2 of 2)]
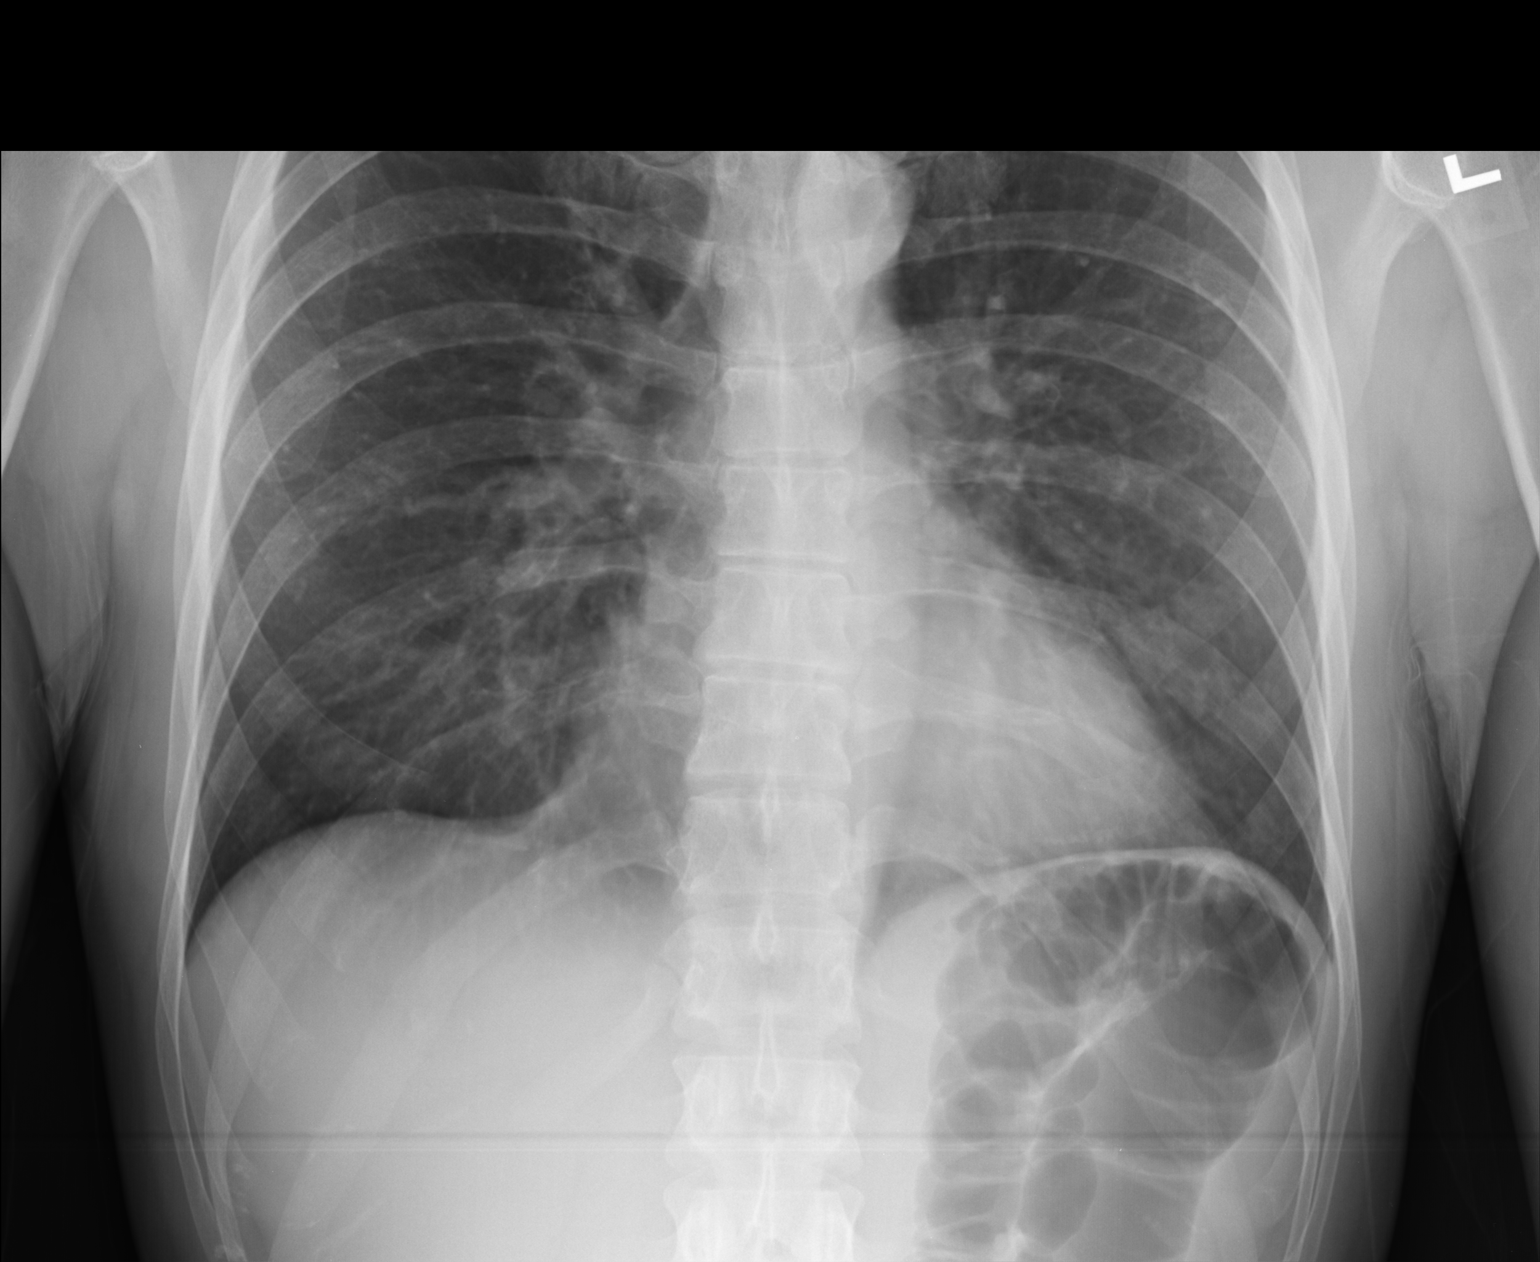

[3 of 3 positions shown; findings below may reference images not displayed]

FINDINGS: The cardiomediastinal silhouette is unremarkable.

Mild peribronchial thickening noted.

There is no evidence of focal airspace disease, pulmonary edema,
suspicious pulmonary nodule/mass, pleural effusion, or pneumothorax.
No acute bony abnormalities are identified.
IMPRESSION: Mild peribronchial thickening without focal pneumonia.

## 2016-09-30 ENCOUNTER — Emergency Department (HOSPITAL_COMMUNITY)
Admission: EM | Admit: 2016-09-30 | Discharge: 2016-10-01 | Disposition: A | Discharge status: Home / Self Care | Payer: Self-pay | Attending: Emergency Medicine | Admitting: Emergency Medicine

## 2016-09-30 ENCOUNTER — Encounter (HOSPITAL_COMMUNITY): Payer: Self-pay | Admitting: Family Medicine

## 2016-09-30 DIAGNOSIS — L03115 Cellulitis of right lower limb: Secondary | ICD-10-CM | POA: Insufficient documentation

## 2016-09-30 DIAGNOSIS — Z79899 Other long term (current) drug therapy: Secondary | ICD-10-CM | POA: Insufficient documentation

## 2016-09-30 DIAGNOSIS — F17210 Nicotine dependence, cigarettes, uncomplicated: Secondary | ICD-10-CM | POA: Insufficient documentation

## 2016-09-30 DIAGNOSIS — L0291 Cutaneous abscess, unspecified: Secondary | ICD-10-CM | POA: Insufficient documentation

## 2016-09-30 MED ORDER — IBUPROFEN 200 MG PO TABS
400.0000 mg | ORAL_TABLET | Freq: Once | ORAL | Status: AC | PRN
  Administered 2016-09-30: 400 mg via ORAL
  Filled 2016-09-30: qty 2

## 2016-09-30 NOTE — ED Triage Notes (Signed)
Patient reports he got bit by a spider yesterday on his right lower leg. Pt's leg is swollen, red, inflamed, and painful. Pt denies any taking medication for symptoms.

## 2016-10-01 MED ORDER — LIDOCAINE-EPINEPHRINE (PF) 2 %-1:200000 IJ SOLN
10.0000 mL | Freq: Once | INTRAMUSCULAR | Status: AC
  Administered 2016-10-01: 10 mL
  Filled 2016-10-01: qty 20

## 2016-10-01 MED ORDER — ACETAMINOPHEN 500 MG PO TABS
1000.0000 mg | ORAL_TABLET | Freq: Once | ORAL | Status: AC
  Administered 2016-10-01: 1000 mg via ORAL
  Filled 2016-10-01: qty 2

## 2016-10-01 MED ORDER — DOXYCYCLINE HYCLATE 100 MG PO CAPS: 100.0000 mg | ORAL_CAPSULE | Freq: Two times a day (BID) | ORAL | 0 refills | Status: DC

## 2016-10-01 MED ORDER — DOXYCYCLINE HYCLATE 100 MG PO TABS
100.0000 mg | ORAL_TABLET | Freq: Once | ORAL | Status: AC
  Administered 2016-10-01: 100 mg via ORAL
  Filled 2016-10-01: qty 1

## 2016-10-01 NOTE — ED Provider Notes (Signed)
[facility]-EMERGENCY DEPT Provider Note   CSN: [id number] Arrival date & time: 09/30/16  2031     History   Chief Complaint Chief Complaint  Patient presents with  . Insect Bite    HPI Brendan Preston is a 41 y.o. male.  Patient presents emergency department with chief complaint of insect bite on his right lower extremity. He states that his symptoms started yesterday. They've gradually worsened until now. He reports mild associated fever. Denies any nausea or vomiting. He states that the area is tender to touch.  He denies any difficulty bending his knee. He denies any other associated symptoms.   The history is provided by the patient. No language interpreter was used.    Past Medical History:  Diagnosis Date  . Anxiety   . Substance abuse     Patient Active Problem List   Diagnosis Date Noted  . Heroin abuse 10/13/2014  . Cellulitis of left upper arm 10/13/2014  . Hypokalemia 10/13/2014  . Hyponatremia 10/13/2014  . Sepsis due to cellulitis (HCC) 10/13/2014  . Substance abuse 04/16/2013    Past Surgical History:  Procedure Laterality Date  . HERNIA REPAIR    . I&D EXTREMITY Left 10/13/2014   Procedure: IRRIGATION AND DEBRIDEMENT EXTREMITY;  Surgeon: Betha Loa, MD;  Location: [facility] OR;  Service: Orthopedics;  Laterality: Left;       Home Medications    Prior to Admission medications   Medication Sig Start Date End Date Taking? Authorizing Provider  amoxicillin-clavulanate (AUGMENTIN) 875-125 MG per tablet Take 1 tablet by mouth 2 (two) times daily. 10/16/14   Marinda Elk, MD  benzonatate (TESSALON) 100 MG capsule Take 1-2 capsules (100-200 mg total) by mouth 3 (three) times daily as needed for cough. Patient not taking: Reported on 10/13/2014 04/16/13   Collene Gobble, MD  carisoprodol (SOMA) 350 MG tablet Take 350 mg by mouth daily as needed for muscle spasms.    [provider]  doxycycline (VIBRAMYCIN) 100 MG capsule Take 1 capsule (100 mg total) by  mouth 2 (two) times daily. 10/01/16   Roxy Horseman, PA-C  HYDROcodone-acetaminophen (NORCO) 7.5-325 MG per tablet Take 1 tablet by mouth every 6 (six) hours as needed for moderate pain. 10/16/14   Marinda Elk, MD    Family History History reviewed. No pertinent family history.  Social History Social History  Substance Use Topics  . Smoking status: Current Every Day Smoker    Packs/day: 0.50    Types: Cigarettes  . Smokeless tobacco: Never Used  . Alcohol use Yes     Comment: Once a week.      Allergies   Penicillins   Review of Systems Review of Systems  All other systems reviewed and are negative.    Physical Exam Updated Vital Signs BP 112/84 (BP Location: Right Arm)   Pulse 94   Temp 100.1 F (37.8 C) (Oral)   Resp 20   Ht 5\' 11"  (1.803 m)   Wt 63.5 kg (140 lb)   SpO2 99%   BMI 19.53 kg/m   Physical Exam  Physical Exam  Constitutional: Pt is oriented to person, place, and time. Pt appears well-developed and well-nourished. No distress.  HENT:  Head: Normocephalic and atraumatic.  Eyes: Conjunctivae are normal. No scleral icterus.  Neck: Normal range of motion.  Cardiovascular: Normal rate, regular rhythm and intact distal pulses.   Pulmonary/Chest: Effort normal and breath sounds normal.  Abdominal: Soft. Pt exhibits no distension. There is no tenderness.  Lymphadenopathy:  Pt has no cervical adenopathy.  Neurological: Pt is alert and oriented to person, place, and time.  Skin: Skin is warm and dry. Pt is not diaphoretic. There is erythema About the right anterior proximal shin extending from the most proximal aspect to the mid shin anteriorly.  Psychiatric: Pt has a normal mood and affect.  Nursing note and vitals reviewed.   ED Treatments / Results  Labs (all labs ordered are listed, but only abnormal results are displayed) Labs Reviewed - No data to display  EKG  EKG Interpretation None       Radiology No results  found.  Procedures Procedures (including critical care time) EMERGENCY DEPARTMENT US SOFT TISSUE INTERPRETATION "Study: Limited Soft Tissue Ultrasound"  INDICATIONS: Soft tissue infection Multiple views of the body part were obtained in real-time with a multi-frequency linear probe  PERFORMED BY: Myself IMAGES ARCHIVED?: Yes SIDE:Right  BODY PART:Lower extremity INTERPRETATION:  Abcess present, cellulitis present  INCISION AND DRAINAGE Performed by: Roxy HorsemanBROWNING, Zayne Marovich Consent: Verbal consent obtained. Risks and benefits: risks, benefits and alternatives were discussed Type: abscess  Body area: right shin  Anesthesia: local infiltration  Incision was made with a scalpel.  Local anesthetic: lidocaine 1% with epinephrine  Anesthetic total: 2 ml  Complexity: complex Blunt dissection to break up loculations  Drainage: purulent  Drainage amount: mild  Packing material: not packed  Patient tolerance: Patient tolerated the procedure well with no immediate complications.     Medications Ordered in ED Medications  lidocaine-EPINEPHrine (XYLOCAINE W/EPI) 2 %-1:200000 (PF) injection 10 mL (not administered)  doxycycline (VIBRA-TABS) tablet 100 mg (not administered)  acetaminophen (TYLENOL) tablet 1,000 mg (not administered)  ibuprofen (ADVIL,MOTRIN) tablet 400 mg (400 mg Oral Given 09/30/16 2217)     Initial Impression / Assessment and Plan / ED Course  I have reviewed the triage vital signs and the nursing notes.  Pertinent labs & imaging results that were available during my care of the patient were reviewed by me and considered in my medical decision making (see chart for details).     Patient with skin abscess amenable to incision and drainage.  Abscess was not large enough to warrant packing or drain,  wound recheck in 2 days. Encouraged home warm soaks and flushing.  Moderate of cellulitis surrounding skin, will treat with doxycycline.  Will d/c to home.   Final  Clinical Impressions(s) / ED Diagnoses   Final diagnoses:  Cellulitis of right lower extremity  Abscess    New Prescriptions New Prescriptions   DOXYCYCLINE (VIBRAMYCIN) 100 MG CAPSULE    Take 1 capsule (100 mg total) by mouth 2 (two) times daily.     Roxy HorsemanBrowning, Teniola Tseng, PA-C 10/01/16 0031    Gilda CreasePollina, Christopher J, MD 10/06/16 [phone]

## 2016-10-04 NOTE — Congregational Nurse Program (Signed)
Congregational Nurse Program Note  Date of Encounter: 09/26/2016  Past Medical History: Past Medical History:  Diagnosis Date  . Anxiety   . Substance abuse     Encounter Details:     CNP Questionnaire - 09/26/16 0935      Patient Demographics   Is this a new or existing patient? New   Patient is considered a/an Not Applicable   Race Caucasian/White     Patient Assistance   Location of Patient Assistance [place]   Patient's financial/insurance status Low Income;Self-Pay (Uninsured)   Uninsured Patient ([place] Card/Care Connects) Yes   Interventions Not Applicable   Patient referred to apply for the following financial assistance [place] [employer]   Food insecurities addressed Not Technical brewerApplicable   Transportation assistance Yes   Type of Assistance Bus Pass Given   Assistance securing medications No   Sales promotion account executiveducational health offerings Behavioral health;Safety;Navigating the healthcare system     Encounter Details   Primary purpose of visit Chronic Illness/Condition Visit;Safety;Spiritual Care/Support Visit;Navigating the Healthcare System   Was an Emergency Department visit averted? Not Applicable   Does patient have a medical provider? No   Patient referred to Clinic   Was a mental health screening completed? (GAINS tool) No   Does patient have dental issues? No   Does patient have vision issues? No   Does your patient have an abnormal blood pressure today? No   Since previous encounter, have you referred patient for abnormal blood pressure that resulted in a new diagnosis or medication change? No   Does your patient have an abnormal blood glucose today? No   Since previous encounter, have you referred patient for abnormal blood glucose that resulted in a new diagnosis or medication change? No   Was there a life-saving intervention made? No     Requested testing for HIV and Hep C.  Testing completed by [facility].  Referred to [facility] for treatment

## 2018-05-31 ENCOUNTER — Emergency Department (HOSPITAL_COMMUNITY)
Admission: EM | Admit: 2018-05-31 | Discharge: 2018-05-31 | Disposition: A | Discharge status: Home / Self Care | Payer: Self-pay | Attending: Emergency Medicine | Admitting: Emergency Medicine

## 2018-05-31 DIAGNOSIS — Z79899 Other long term (current) drug therapy: Secondary | ICD-10-CM | POA: Insufficient documentation

## 2018-05-31 DIAGNOSIS — T401X1A Poisoning by heroin, accidental (unintentional), initial encounter: Secondary | ICD-10-CM | POA: Insufficient documentation

## 2018-05-31 DIAGNOSIS — F17210 Nicotine dependence, cigarettes, uncomplicated: Secondary | ICD-10-CM | POA: Insufficient documentation

## 2018-05-31 MED ORDER — SODIUM CHLORIDE 0.9 % IV BOLUS: 500.0000 mL | Freq: Once | INTRAVENOUS | Status: DC

## 2018-05-31 NOTE — ED Triage Notes (Signed)
Pt BIB [facility] from home. Pt arrived home after being out with friends. He started becoming lethargic and father called EMS. EMS arrived and pt was in and out of consciousness. Pt declined from there. RR 6x a min and assisted with BVM upon arrival. Pt responsive to painful stimuli only. Unknown amount of drugs on board.

## 2018-05-31 NOTE — Discharge Instructions (Signed)
Try to avoid using all forms of narcotics.  Use the attached resource guide to help you find a treatment facility who can help you.  Return here, if needed, for problems.

## 2018-05-31 NOTE — ED Provider Notes (Signed)
[facility]-EMERGENCY DEPT Provider Note   CSN: [id number] Arrival date & time: 05/31/18  1848    History   Chief Complaint Chief Complaint  Patient presents with  . Drug Overdose    HPI Brendan Preston is a 43 y.o. male.     HPI   He presents by EMS for evaluation of altered mental status with worsening, during transport.  Patient is on no narcotics abuser.  He was not given Narcan because his blood pressure was elevated.  On arrival he is being bagged.  Patient is unable to give any history.  He is obtunded.  Level 5 caveat-altered mental status  Past Medical History:  Diagnosis Date  . Anxiety   . Substance abuse     Patient Active Problem List   Diagnosis Date Noted  . Heroin abuse (HCC) 10/13/2014  . Cellulitis of left upper arm 10/13/2014  . Hypokalemia 10/13/2014  . Hyponatremia 10/13/2014  . Sepsis due to cellulitis (HCC) 10/13/2014  . Substance abuse (HCC) 04/16/2013    Past Surgical History:  Procedure Laterality Date  . HERNIA REPAIR    . I&D EXTREMITY Left 10/13/2014   Procedure: IRRIGATION AND DEBRIDEMENT EXTREMITY;  Surgeon: Betha Loa, MD;  Location: [facility] OR;  Service: Orthopedics;  Laterality: Left;        Home Medications    Prior to Admission medications   Medication Sig Start Date End Date Taking? Authorizing Provider  amoxicillin-clavulanate (AUGMENTIN) 875-125 MG per tablet Take 1 tablet by mouth 2 (two) times daily. 10/16/14   Marinda Elk, MD  benzonatate (TESSALON) 100 MG capsule Take 1-2 capsules (100-200 mg total) by mouth 3 (three) times daily as needed for cough. Patient not taking: Reported on 10/13/2014 04/16/13   Collene Gobble, MD  carisoprodol (SOMA) 350 MG tablet Take 350 mg by mouth daily as needed for muscle spasms.    [provider]  doxycycline (VIBRAMYCIN) 100 MG capsule Take 1 capsule (100 mg total) by mouth 2 (two) times daily. 10/01/16   Roxy Horseman, PA-C  HYDROcodone-acetaminophen  (NORCO) 7.5-325 MG per tablet Take 1 tablet by mouth every 6 (six) hours as needed for moderate pain. 10/16/14   Marinda Elk, MD    Family History No family history on file.  Social History Social History   Tobacco Use  . Smoking status: Current Every Day Smoker    Packs/day: 0.50    Types: Cigarettes  . Smokeless tobacco: Never Used  Substance Use Topics  . Alcohol use: Yes    Comment: Once a week.   . Drug use: Yes    Types: Marijuana    Comment: Hx of Heroin but denies currently. Last used: Marijuana Yesterday      Allergies   Penicillins   Review of Systems Review of Systems  All other systems reviewed and are negative.    Physical Exam Updated Vital Signs BP 107/69   Pulse 82   Temp 98.2 F (36.8 C) (Oral)   Resp 17   Ht 5\' 11"  (1.803 m)   Wt 63.5 kg   SpO2 97%   BMI 19.53 kg/m   Physical Exam Vitals signs and nursing note reviewed.  Constitutional:      General: He is in acute distress.     Appearance: He is well-developed. He is ill-appearing. He is not toxic-appearing or diaphoretic.     Comments: He appears under nourished  HENT:     Head: Normocephalic and atraumatic.  Right Ear: External ear normal.     Left Ear: External ear normal.  Eyes:     Conjunctiva/sclera: Conjunctivae normal.     Comments: On arrival pupils 2 mm, bilaterally reactive.  Neck:     Musculoskeletal: Normal range of motion and neck supple.     Trachea: Phonation normal.  Cardiovascular:     Rate and Rhythm: Regular rhythm. Tachycardia present.     Heart sounds: Normal heart sounds.  Pulmonary:     Comments: Decreased respiratory rate on arrival Abdominal:     Palpations: Abdomen is soft. There is no mass.     Tenderness: There is no abdominal tenderness.  Musculoskeletal:        General: No swelling, tenderness or signs of injury.  Skin:    General: Skin is warm and dry.  Neurological:     Mental Status: He is alert.     GCS: GCS eye subscore is 1.  GCS verbal subscore is 1. GCS motor subscore is 1.     Motor: No abnormal muscle tone.  Psychiatric:     Comments: Obtunded      ED Treatments / Results  Labs (all labs ordered are listed, but only abnormal results are displayed) Labs Reviewed - No data to display  EKG None  Radiology No results found.  Procedures .Critical Care Performed by: Mancel BaleWentz, Johnnell Liou, MD Authorized by: Mancel BaleWentz, Malynda Smolinski, MD   Critical care provider statement:    Critical care time (minutes):  35   Critical care start time:  05/31/2018 6:50 PM   Critical care end time:  05/31/2018 8:35 PM   Critical care time was exclusive of:  Separately billable procedures and treating other patients   Critical care was necessary to treat or prevent imminent or life-threatening deterioration of the following conditions:  Circulatory failure   Critical care was time spent personally by me on the following activities:  Blood draw for specimens, development of treatment plan with patient or surrogate, discussions with consultants, evaluation of patient's response to treatment, examination of patient, obtaining history from patient or surrogate, ordering and performing treatments and interventions, ordering and review of laboratory studies, pulse oximetry, re-evaluation of patient's condition, review of old charts and ordering and review of radiographic studies   (including critical care time)  Medications Ordered in ED Medications  sodium chloride 0.9 % bolus 500 mL (has no administration in time range)     Initial Impression / Assessment and Plan / ED Course  I have reviewed the triage vital signs and the nursing notes.  Pertinent labs & imaging results that were available during my care of the patient were reviewed by me and considered in my medical decision making (see chart for details).  Clinical Course as of May 31 2033  Wynelle LinkSun May 31, 2018  [id number] He was given Narcan on arrival and within 45 seconds awoke and was  conversant.   [EW]    Clinical Course User Index [EW] Mancel BaleWentz, Liana Camerer, MD        Patient Vitals for the past 24 hrs:  BP Temp Temp src Pulse Resp SpO2 Height Weight  05/31/18 2012 107/69 - - 82 17 97 % - -  05/31/18 1852 - - - - - - 5\' 11"  (1.803 m) 63.5 kg  05/31/18 1851 (!) 148/90 98.2 F (36.8 C) Oral (!) 127 14 100 % - -  05/31/18 1850 (!) 148/90 - - (!) 128 14 100 % - -    8:34 PM Reevaluation  with update and discussion. After initial assessment and treatment, an updated evaluation reveals he is awake, alert and admits to using heroin today.  He is remorseful and wants to "get off it."  He is receptive to receiving instructions for resources.  Discussed and questions answered. Mancel Bale   Medical Decision Making: Heroin overdose, unintentional, recovered with a single dose of Narcan.  No evidence for persistent intoxication, or dangerous coingestants.  CRITICAL CARE-yes Performed by: Mancel Bale    Nursing Notes Reviewed/ Care Coordinated Applicable Imaging Reviewed Interpretation of Laboratory Data incorporated into ED treatment  The patient appears reasonably screened and/or stabilized for discharge and I doubt any other medical condition or other [facility] requiring further screening, evaluation, or treatment in the ED at this time prior to discharge.  Plan: Home Medications-OTC as needed; Home Treatments-rest, fluids, avoid narcotic medications or or recreational drugs; return here if the recommended treatment, does not improve the symptoms; Recommended follow up-PCP, PRN.  Outpatient substance abuse facility of choice.   Final Clinical Impressions(s) / ED Diagnoses   Final diagnoses:  Accidental overdose of heroin, initial encounter [facility])    ED Discharge Orders    None       Mancel Bale, MD 05/31/18 2036

## 2018-05-31 NOTE — ED Notes (Signed)
2 mg Narcan have been given IV by Lucrezia Europe RN.

## 2018-05-31 NOTE — ED Notes (Signed)
Patient given PO fluids and encouraged to drink.  

## 2018-05-31 NOTE — ED Notes (Signed)
Patient is calling friends for a ride.

## 2018-08-04 ENCOUNTER — Encounter (HOSPITAL_COMMUNITY): Payer: Self-pay

## 2018-08-04 ENCOUNTER — Other Ambulatory Visit: Payer: Self-pay

## 2018-08-04 ENCOUNTER — Emergency Department (HOSPITAL_COMMUNITY)
Admission: EM | Admit: 2018-08-04 | Discharge: 2018-08-05 | Disposition: A | Discharge status: Home / Self Care | Payer: Self-pay | Attending: Emergency Medicine | Admitting: Emergency Medicine

## 2018-08-04 DIAGNOSIS — F17210 Nicotine dependence, cigarettes, uncomplicated: Secondary | ICD-10-CM | POA: Insufficient documentation

## 2018-08-04 DIAGNOSIS — Z79899 Other long term (current) drug therapy: Secondary | ICD-10-CM | POA: Insufficient documentation

## 2018-08-04 DIAGNOSIS — T401X1A Poisoning by heroin, accidental (unintentional), initial encounter: Secondary | ICD-10-CM | POA: Insufficient documentation

## 2018-08-04 MED ORDER — NALOXONE HCL 2 MG/2ML IJ SOSY
1.0000 mg | PREFILLED_SYRINGE | INTRAMUSCULAR | Status: DC | PRN
  Administered 2018-08-04 (×2): 1 mg via INTRAVENOUS
  Filled 2018-08-04: qty 2

## 2018-08-04 NOTE — ED Provider Notes (Addendum)
[facility]-EMERGENCY DEPT Provider Note   CSN: [id number] Arrival date & time: 08/04/18  2223    History   Chief Complaint Chief Complaint  Patient presents with  . heroin overdose    HPI Brendan Preston is a 43 y.o. male.     HPI Patient is brought in by EMS.  Reportedly he was with a friend and snorting heroin.  His friend called EMS.  Patient was very somnolent but not having immediate respiratory distress, improved on 2 L oxygen.  No Narcan administered.  Patient brought in with peripheral IV.  He is very groggy.  To me he is denying snorting heroin.  His nurse reports that he did admit to snorting heroin. Past Medical History:  Diagnosis Date  . Anxiety   . Substance abuse [facility])     Patient Active Problem List   Diagnosis Date Noted  . Heroin abuse (HCC) 10/13/2014  . Cellulitis of left upper arm 10/13/2014  . Hypokalemia 10/13/2014  . Hyponatremia 10/13/2014  . Sepsis due to cellulitis (HCC) 10/13/2014  . Substance abuse (HCC) 04/16/2013    Past Surgical History:  Procedure Laterality Date  . HERNIA REPAIR    . I&D EXTREMITY Left 10/13/2014   Procedure: IRRIGATION AND DEBRIDEMENT EXTREMITY;  Surgeon: Betha Loa, MD;  Location: [facility] OR;  Service: Orthopedics;  Laterality: Left;        Home Medications    Prior to Admission medications   Medication Sig Start Date End Date Taking? Authorizing Provider  amoxicillin-clavulanate (AUGMENTIN) 875-125 MG per tablet Take 1 tablet by mouth 2 (two) times daily. 10/16/14   Marinda Elk, MD  benzonatate (TESSALON) 100 MG capsule Take 1-2 capsules (100-200 mg total) by mouth 3 (three) times daily as needed for cough. Patient not taking: Reported on 10/13/2014 04/16/13   Collene Gobble, MD  carisoprodol (SOMA) 350 MG tablet Take 350 mg by mouth daily as needed for muscle spasms.    [provider]  doxycycline (VIBRAMYCIN) 100 MG capsule Take 1 capsule (100 mg total) by mouth 2 (two) times  daily. 10/01/16   Roxy Horseman, PA-C  HYDROcodone-acetaminophen (NORCO) 7.5-325 MG per tablet Take 1 tablet by mouth every 6 (six) hours as needed for moderate pain. 10/16/14   Marinda Elk, MD    Family History History reviewed. No pertinent family history.  Social History Social History   Tobacco Use  . Smoking status: Current Every Day Smoker    Packs/day: 0.50    Types: Cigarettes  . Smokeless tobacco: Never Used  Substance Use Topics  . Alcohol use: Yes    Comment: Once a week.   . Drug use: Yes    Types: Marijuana    Comment: Hx of Heroin but denies currently. Last used: Marijuana Yesterday      Allergies   Penicillins   Review of Systems Review of Systems 5 caveat cannot obtain review systems because patient is somnolent.  Physical Exam Updated Vital Signs BP 125/82 (BP Location: Left Arm)   Pulse 77   Temp 98.9 F (37.2 C) (Oral)   Resp 10   Ht 5\' 11"  (1.803 m)   Wt 68 kg   SpO2 99%   BMI 20.92 kg/m   Physical Exam  Updated Vital Signs BP 122/76   Pulse 89   Temp 98.9 F (37.2 C) (Oral)   Resp 14   Ht 5\' 11"  (1.803 m)   Wt 68 kg   SpO2 96%   BMI 20.92  kg/m   Physical Exam Constitutional:      Comments: Patient is thin.  He does not have respiratory distress.  He is sleeping in a sitting up position with his head slightly lolling.  He awakens to brisk voice stimulus.  Temporarily opens his eyes makes a short response and goes back to sleep.  HENT:     Head: Normocephalic and atraumatic.     Mouth/Throat:     Mouth: Mucous membranes are moist.     Pharynx: Oropharynx is clear.  Eyes:     Extraocular Movements: Extraocular movements intact.     Comments: Pupils constricted.  Cardiovascular:     Rate and Rhythm: Normal rate and regular rhythm.  Pulmonary:     Effort: Pulmonary effort is normal.     Breath sounds: Normal breath sounds.  Abdominal:     General: There is no distension.     Palpations: Abdomen is soft.      Tenderness: There is no abdominal tenderness. There is no guarding.  Musculoskeletal: Normal range of motion.        General: No swelling or tenderness.     Right lower leg: No edema.     Left lower leg: No edema.  Skin:    General: Skin is warm and dry.  Neurological:     Comments: Very somnolent.  Limited one-word answers.  No focal motor deficit.   ED Treatments / Results  Labs (all labs ordered are listed, but only abnormal results are displayed) Labs Reviewed - No data to display  EKG None  Radiology No results found.  Procedures Procedures (including critical care time) CRITICAL CARE Performed by: Arby BarretteMarcy Amir Fick   Total critical care time: 30 minutes  Critical care time was exclusive of separately billable procedures and treating other patients.  Critical care was necessary to treat or prevent imminent or life-threatening deterioration.  Critical care was time spent personally by me on the following activities: development of treatment plan with patient and/or surrogate as well as nursing, discussions with consultants, evaluation of patient's response to treatment, examination of patient, obtaining history from patient or surrogate, ordering and performing treatments and interventions, ordering and review of laboratory studies, ordering and review of radiographic studies, pulse oximetry and re-evaluation of patient's condition. Medications Ordered in ED Medications  naloxone (NARCAN) injection 1 mg (1 mg Intravenous Given 08/04/18 2342)     Initial Impression / Assessment and Plan / ED Course  I have reviewed the triage vital signs and the nursing notes.  Pertinent labs & imaging results that were available during my care of the patient were reviewed by me and considered in my medical decision making (see chart for details).  Clinical Course as of Aug 03 2349  Tue Aug 04, 2018  2255 Patient more awake now.  He has had a milligram of Narcan.  He does not remember  what happened.  He does report that now he feels kind of nauseated.  No respiratory distress.  Speaking in full sentences.   [MP]    Clinical Course User Index [MP] Arby BarrettePfeiffer, Toddrick Sanna, MD      Patient had heroin overdose by snorting.  Patient does not use IV heroin.  No track marks on his arms.  Patient did awaken after administration of 1 mg of Narcan.  He has not had hypoxia.  He does become drowsy but now awakens to light stimulus.  Will need continued observation until no longer somnolent.  Otherwise, no evidence of traumatic injury  or other acute medical issues.   Final Clinical Impressions(s) / ED Diagnoses   Final diagnoses:  Accidental overdose of heroin, initial encounter [facility])    ED Discharge Orders    None       Arby Barrette, MD 08/04/18 1324    Arby Barrette, MD 08/04/18 2354

## 2018-08-04 NOTE — ED Provider Notes (Signed)
[facility]-EMERGENCY DEPT Provider Note   CSN: [id number] Arrival date & time: 08/04/18  2223    History   Chief Complaint Chief Complaint  Patient presents with  . heroin overdose    HPI Augusta Hilbert is a 43 y.o. male.     HPI Patient is brought in by EMS.  Reportedly he was with a friend and snorting heroin.  His friend called EMS.  Patient was very somnolent but not having immediate respiratory distress, improved on 2 L oxygen.  No Narcan administered.  Patient brought in with peripheral IV.  He is very groggy.  To me he is denying snorting heroin.  His nurse reports that he did admit to snorting heroin. Past Medical History:  Diagnosis Date  . Anxiety   . Substance abuse [facility])     Patient Active Problem List   Diagnosis Date Noted  . Heroin abuse (HCC) 10/13/2014  . Cellulitis of left upper arm 10/13/2014  . Hypokalemia 10/13/2014  . Hyponatremia 10/13/2014  . Sepsis due to cellulitis (HCC) 10/13/2014  . Substance abuse (HCC) 04/16/2013    Past Surgical History:  Procedure Laterality Date  . HERNIA REPAIR    . I&D EXTREMITY Left 10/13/2014   Procedure: IRRIGATION AND DEBRIDEMENT EXTREMITY;  Surgeon: Betha Loa, MD;  Location: [facility] OR;  Service: Orthopedics;  Laterality: Left;        Home Medications    Prior to Admission medications   Medication Sig Start Date End Date Taking? Authorizing Provider  amoxicillin-clavulanate (AUGMENTIN) 875-125 MG per tablet Take 1 tablet by mouth 2 (two) times daily. Patient not taking: Reported on 08/05/2018 10/16/14   Marinda Elk, MD  benzonatate (TESSALON) 100 MG capsule Take 1-2 capsules (100-200 mg total) by mouth 3 (three) times daily as needed for cough. Patient not taking: Reported on 10/13/2014 04/16/13   Collene Gobble, MD  doxycycline (VIBRAMYCIN) 100 MG capsule Take 1 capsule (100 mg total) by mouth 2 (two) times daily. Patient not taking: Reported on 08/05/2018 10/01/16   Roxy Horseman,  PA-C  HYDROcodone-acetaminophen [facility]) 7.5-325 MG per tablet Take 1 tablet by mouth every 6 (six) hours as needed for moderate pain. Patient not taking: Reported on 08/05/2018 10/16/14   Marinda Elk, MD  naloxone [facility]) nasal spray 4 mg/0.1 mL Use as needed for opiate overdose 08/05/18   Ward, Layla Maw, DO    Family History History reviewed. No pertinent family history.  Social History Social History   Tobacco Use  . Smoking status: Current Every Day Smoker    Packs/day: 0.50    Types: Cigarettes  . Smokeless tobacco: Never Used  Substance Use Topics  . Alcohol use: Yes    Comment: Once a week.   . Drug use: Yes    Types: Marijuana    Comment: Hx of Heroin but denies currently. Last used: Marijuana Yesterday      Allergies   Penicillins   Review of Systems Review of Systems Level 5 caveat cannot obtain review of systems due to patient's somnolence and condition.  Physical Exam Updated Vital Signs BP 104/63 (BP Location: Right Arm) Comment: Simultaneous filing. User may not have seen previous data.  Pulse 60 Comment: Simultaneous filing. User may not have seen previous data.  Temp 98.9 F (37.2 C) (Oral)   Resp 12   Ht  (1.803 m)   Wt 68 kg   SpO2 96% Comment: Simultaneous filing. User may not have seen previous data.  BMI 20.92  kg/m   Physical Exam Constitutional:      Comments: Patient is thin.  He does not have respiratory distress.  He is sleeping in a sitting up position with his head slightly lolling.  He awakens to brisk voice stimulus.  Temporarily opens his eyes makes a short response and goes back to sleep.  HENT:     Head: Normocephalic and atraumatic.     Mouth/Throat:     Mouth: Mucous membranes are moist.     Pharynx: Oropharynx is clear.  Eyes:     Extraocular Movements: Extraocular movements intact.     Comments: Pupils constricted.  Cardiovascular:     Rate and Rhythm: Normal rate and regular rhythm.  Pulmonary:     Effort:  Pulmonary effort is normal.     Breath sounds: Normal breath sounds.  Abdominal:     General: There is no distension.     Palpations: Abdomen is soft.     Tenderness: There is no abdominal tenderness. There is no guarding.  Musculoskeletal: Normal range of motion.        General: No swelling or tenderness.     Right lower leg: No edema.     Left lower leg: No edema.  Skin:    General: Skin is warm and dry.  Neurological:     Comments: Very somnolent.  Limited one-word answers.  No focal motor deficit.      ED Treatments / Results  Labs (all labs ordered are listed, but only abnormal results are displayed) Labs Reviewed - No data to display  EKG None  Radiology No results found.  Procedures Procedures (including critical care time) CRITICAL CARE Performed by: Arby BarretteMarcy Valina Maes   Total critical care time: 30 minutes  Critical care time was exclusive of separately billable procedures and treating other patients.  Critical care was necessary to treat or prevent imminent or life-threatening deterioration.  Critical care was time spent personally by me on the following activities: development of treatment plan with patient and/or surrogate as well as nursing, discussions with consultants, evaluation of patient's response to treatment, examination of patient, obtaining history from patient or surrogate, ordering and performing treatments and interventions, ordering and review of laboratory studies, ordering and review of radiographic studies, pulse oximetry and re-evaluation of patient's condition. Medications Ordered in ED Medications - No data to display   Initial Impression / Assessment and Plan / ED Course  I have reviewed the triage vital signs and the nursing notes.  Pertinent labs & imaging results that were available during my care of the patient were reviewed by me and considered in my medical decision making (see chart for details).  Clinical Course as of Aug 06 14  Tue Aug 04, 2018  2255 Patient more awake now.  He has had a milligram of Narcan.  He does not remember what happened.  He does report that now he feels kind of nauseated.  No respiratory distress.  Speaking in full sentences.   [MP]    Clinical Course User Index [MP] Arby BarrettePfeiffer, Breaker Springer, MD       Patient appears to have uncomplicated heroin overdose.  He did remain somnolent.  Narcan administered with improved level of alertness.  Patient will require several more hours of observation.  Dr. Elesa MassedWard to reevaluate and determine if stable for discharge. Final Clinical Impressions(s) / ED Diagnoses   Final diagnoses:  Accidental overdose of heroin, initial encounter [facility](HCC)    ED Discharge Orders  Ordered    naloxone [facility]) nasal spray 4 mg/0.1 mL     08/05/18 5885           Arby Barrette, MD 08/06/18 [phone]

## 2018-08-04 NOTE — ED Triage Notes (Signed)
Pt came by EMS with a heroin overdose, a friend called  Pt was alert to voice upon EMS arrival, became more alert when they got outside, no narcan given, pt placed on 2 liters O2

## 2018-08-04 NOTE — ED Notes (Signed)
Blood save tubes collected and sent to lab.

## 2018-08-04 NOTE — ED Notes (Signed)
Bed: RESA Expected date:  Expected time:  Means of arrival:  Comments: 43 yr old heroin overdose

## 2018-08-04 NOTE — ED Provider Notes (Addendum)
11:30 PM  Assumed care from Dr. Johnney Killian.  Patient is a 43 y.o. M with h/o heroin abuse who presents to ED with EMS after snorting heroin.  Friend called due to patient being somnolent but not apneic, cyanotic.  Received Narcan here and is on nasal cannula.  After Narcan, patient awake without complaints.  Denies that this was intentional.  Has long documented history of opiate abuse.  Will need to be observed until clinically sober.   12:15 AM  Pt appears to be resting comfortably.  Normal vital signs.  Not hypoxic on room air.   3:15 AM  Pt now more awake and talking.  Sats did drop to 88% on room air.  Placed on 2 L nasal cannula.  He has no complaints.  We will continue to monitor in the ED.  6:30 AM  Pt is now more awake.  He is able to tolerate p.o.  Able to ambulate.  He has been off of oxygen without any further desaturation.  I feel he is safe to be discharged.  Given prescription for Narcan kit.  At this time, I do not feel there is any life-threatening condition present. I have reviewed and discussed all results (EKG, imaging, lab, urine as appropriate) and exam findings with patient/family. I have reviewed nursing notes and appropriate previous records.  I feel the patient is safe to be discharged home without further emergent workup and can continue workup as an outpatient as needed. Discussed usual and customary return precautions. Patient/family verbalize understanding and are comfortable with this plan.  Outpatient follow-up has been provided as needed. All questions have been answered.       Ward, Delice Bison, DO 08/05/18 [phone]

## 2018-08-05 MED ORDER — NALOXONE HCL 4 MG/0.1ML NA LIQD: NASAL | 0 refills | Status: DC

## 2018-08-05 NOTE — ED Notes (Signed)
Patient given PO fluids, encouraged to drink

## 2018-08-05 NOTE — ED Notes (Signed)
Patient's oxygen saturation dropped to 88% on RA. Encouraged deep breathing and placed patient on 2 L of O2 via [place]. Oxygen saturation now 98%.

## 2018-08-05 NOTE — ED Notes (Signed)
Patient ambulated to RR and back without distress.

## 2018-08-05 NOTE — Discharge Instructions (Addendum)
Steps to find a Primary Care Provider (PCP): ° °Call [phone] or [phone] to access "[facility] Find a Doctor Service." ° °2.  You may also go on the [facility] website at [email or web address] ° °3.  [facility] also frequently accepts new patients. ° °[facility]  °[address] °[place] [address] °[phone] ° °4.  There are also multiple [facility] and [facility] practices throughout [place] that are frequently accepting new patients. You may find a clinic that is close to your home and contact them. ° °[facility] °[email or web address] °[phone] ° °[facility] °[email or web address].[facility] ° °[facility] °[email or web address] °[phone] ° °[facility] °[email or web address] °[phone] ° °5.  Local Health Departments also can provide primary care services. ° °[facility]  °[address] °[place] [address] [phone] ° °[facility] °[address] °[place] [address] °[phone] ° °[facility] °[address]  °[place] [address] °[phone] ° ° °

## 2018-11-06 ENCOUNTER — Other Ambulatory Visit: Payer: Self-pay

## 2018-11-06 ENCOUNTER — Inpatient Hospital Stay (HOSPITAL_COMMUNITY)
Admission: EM | Admit: 2018-11-06 | Discharge: 2018-11-10 | DRG: 872 | Disposition: A | Discharge status: Home / Self Care | Payer: Self-pay | Attending: Internal Medicine | Admitting: Internal Medicine

## 2018-11-06 ENCOUNTER — Emergency Department (HOSPITAL_COMMUNITY): Payer: Self-pay

## 2018-11-06 ENCOUNTER — Encounter (HOSPITAL_COMMUNITY): Payer: Self-pay | Admitting: Emergency Medicine

## 2018-11-06 DIAGNOSIS — A419 Sepsis, unspecified organism: Principal | ICD-10-CM | POA: Diagnosis present

## 2018-11-06 DIAGNOSIS — Z20828 Contact with and (suspected) exposure to other viral communicable diseases: Secondary | ICD-10-CM | POA: Diagnosis present

## 2018-11-06 DIAGNOSIS — F17210 Nicotine dependence, cigarettes, uncomplicated: Secondary | ICD-10-CM | POA: Diagnosis present

## 2018-11-06 DIAGNOSIS — B9689 Other specified bacterial agents as the cause of diseases classified elsewhere: Secondary | ICD-10-CM | POA: Diagnosis present

## 2018-11-06 DIAGNOSIS — F1110 Opioid abuse, uncomplicated: Secondary | ICD-10-CM

## 2018-11-06 DIAGNOSIS — E876 Hypokalemia: Secondary | ICD-10-CM | POA: Diagnosis present

## 2018-11-06 DIAGNOSIS — Z880 Allergy status to penicillin: Secondary | ICD-10-CM

## 2018-11-06 DIAGNOSIS — F1123 Opioid dependence with withdrawal: Secondary | ICD-10-CM | POA: Diagnosis present

## 2018-11-06 DIAGNOSIS — Z720 Tobacco use: Secondary | ICD-10-CM | POA: Diagnosis present

## 2018-11-06 DIAGNOSIS — L03114 Cellulitis of left upper limb: Secondary | ICD-10-CM | POA: Diagnosis present

## 2018-11-06 DIAGNOSIS — Z79899 Other long term (current) drug therapy: Secondary | ICD-10-CM

## 2018-11-06 HISTORY — DX: Personal history of other diseases of the digestive system: Z87.19

## 2018-11-06 LAB — CBC WITH DIFFERENTIAL/PLATELET
Abs Immature Granulocytes: 0.07 10*3/uL (ref 0.00–0.07)
Basophils Absolute: 0 10*3/uL (ref 0.0–0.1)
Basophils Relative: 0 %
Eosinophils Absolute: 0 10*3/uL (ref 0.0–0.5)
Eosinophils Relative: 0 %
HCT: 44.1 % (ref 39.0–52.0)
Hemoglobin: 14.7 g/dL (ref 13.0–17.0)
Immature Granulocytes: 1 %
Lymphocytes Relative: 6 %
Lymphs Abs: 0.7 10*3/uL (ref 0.7–4.0)
MCH: 28.9 pg (ref 26.0–34.0)
MCHC: 33.3 g/dL (ref 30.0–36.0)
MCV: 86.8 fL (ref 80.0–100.0)
Monocytes Absolute: 0.3 10*3/uL (ref 0.1–1.0)
Monocytes Relative: 3 %
Neutro Abs: 10.9 10*3/uL — ABNORMAL HIGH (ref 1.7–7.7)
Neutrophils Relative %: 90 %
Platelets: 249 10*3/uL (ref 150–400)
RBC: 5.08 MIL/uL (ref 4.22–5.81)
RDW: 14 % (ref 11.5–15.5)
WBC: 11.9 10*3/uL — ABNORMAL HIGH (ref 4.0–10.5)
nRBC: 0 % (ref 0.0–0.2)

## 2018-11-06 LAB — COMPREHENSIVE METABOLIC PANEL
ALT: 29 U/L (ref 0–44)
AST: 24 U/L (ref 15–41)
Albumin: 3.9 g/dL (ref 3.5–5.0)
Alkaline Phosphatase: 84 U/L (ref 38–126)
Anion gap: 12 (ref 5–15)
BUN: 5 mg/dL — ABNORMAL LOW (ref 6–20)
CO2: 23 mmol/L (ref 22–32)
Calcium: 9.1 mg/dL (ref 8.9–10.3)
Chloride: 103 mmol/L (ref 98–111)
Creatinine, Ser: 0.66 mg/dL (ref 0.61–1.24)
GFR calc Af Amer: >60 mL/min (ref >60)
GFR calc non Af Amer: >60 mL/min (ref >60)
Glucose, Bld: 121 mg/dL — ABNORMAL HIGH (ref 70–99)
Potassium: 3.4 mmol/L — ABNORMAL LOW (ref 3.5–5.1)
Sodium: 138 mmol/L (ref 135–145)
Total Bilirubin: 0.9 mg/dL (ref 0.3–1.2)
Total Protein: 7.3 g/dL (ref 6.5–8.1)

## 2018-11-06 LAB — URINALYSIS, ROUTINE W REFLEX MICROSCOPIC
Bilirubin Urine: NEGATIVE
Glucose, UA: NEGATIVE mg/dL
Hgb urine dipstick: NEGATIVE
Ketones, ur: 5 mg/dL — AB
Leukocytes,Ua: NEGATIVE
Nitrite: NEGATIVE
Protein, ur: NEGATIVE mg/dL
Specific Gravity, Urine: 1.013 (ref 1.005–1.030)
pH: 8 (ref 5.0–8.0)

## 2018-11-06 LAB — PROTIME-INR
INR: 1.1 (ref 0.8–1.2)
Prothrombin Time: 13.9 seconds (ref 11.4–15.2)

## 2018-11-06 LAB — SARS CORONAVIRUS 2 BY RT PCR (HOSPITAL ORDER, PERFORMED IN ~~LOC~~ HOSPITAL LAB): SARS Coronavirus 2: NEGATIVE

## 2018-11-06 LAB — PHOSPHORUS: Phosphorus: 2 mg/dL — ABNORMAL LOW (ref 2.5–4.6)

## 2018-11-06 LAB — LACTIC ACID, PLASMA
Lactic Acid, Venous: 1.2 mmol/L (ref 0.5–1.9)
Lactic Acid, Venous: 1.5 mmol/L (ref 0.5–1.9)

## 2018-11-06 LAB — APTT: aPTT: 28 seconds (ref 24–36)

## 2018-11-06 LAB — MAGNESIUM: Magnesium: 2.1 mg/dL (ref 1.7–2.4)

## 2018-11-06 LAB — PROCALCITONIN: Procalcitonin: 0.55 ng/mL

## 2018-11-06 MED ORDER — ACETAMINOPHEN 325 MG PO TABS
650.0000 mg | ORAL_TABLET | Freq: Four times a day (QID) | ORAL | Status: DC | PRN
  Administered 2018-11-07: 650 mg via ORAL
  Filled 2018-11-06: qty 2

## 2018-11-06 MED ORDER — SODIUM CHLORIDE 0.9 % IV BOLUS (SEPSIS)
250.0000 mL | Freq: Once | INTRAVENOUS | Status: AC
  Administered 2018-11-06: 250 mL via INTRAVENOUS

## 2018-11-06 MED ORDER — SODIUM CHLORIDE 0.9 % IV SOLN
INTRAVENOUS | Status: AC
  Administered 2018-11-06: 23:00:00 via INTRAVENOUS

## 2018-11-06 MED ORDER — SODIUM CHLORIDE 0.9 % IV BOLUS (SEPSIS)
1000.0000 mL | Freq: Once | INTRAVENOUS | Status: AC
  Administered 2018-11-06: 1000 mL via INTRAVENOUS

## 2018-11-06 MED ORDER — VANCOMYCIN HCL 10 G IV SOLR
1250.0000 mg | Freq: Two times a day (BID) | INTRAVENOUS | Status: DC
  Administered 2018-11-07 (×2): 1250 mg via INTRAVENOUS
  Filled 2018-11-06 (×3): qty 1250

## 2018-11-06 MED ORDER — METRONIDAZOLE IN NACL 5-0.79 MG/ML-% IV SOLN
500.0000 mg | Freq: Three times a day (TID) | INTRAVENOUS | Status: DC
  Administered 2018-11-07 (×3): 500 mg via INTRAVENOUS
  Filled 2018-11-06 (×3): qty 100

## 2018-11-06 MED ORDER — LEVOFLOXACIN IN D5W 750 MG/150ML IV SOLN: 750.0000 mg | Freq: Once | INTRAVENOUS | Status: DC

## 2018-11-06 MED ORDER — SODIUM CHLORIDE 0.9 % IV SOLN
2.0000 g | Freq: Once | INTRAVENOUS | Status: AC
  Administered 2018-11-06: 2 g via INTRAVENOUS
  Filled 2018-11-06: qty 2

## 2018-11-06 MED ORDER — ONDANSETRON HCL 4 MG PO TABS
4.0000 mg | ORAL_TABLET | Freq: Four times a day (QID) | ORAL | Status: DC | PRN
  Administered 2018-11-08: 4 mg via ORAL
  Filled 2018-11-06: qty 1

## 2018-11-06 MED ORDER — VANCOMYCIN HCL 500 MG IV SOLR
500.0000 mg | Freq: Once | INTRAVENOUS | Status: DC
  Filled 2018-11-06: qty 500

## 2018-11-06 MED ORDER — METRONIDAZOLE IN NACL 5-0.79 MG/ML-% IV SOLN
500.0000 mg | Freq: Once | INTRAVENOUS | Status: AC
  Administered 2018-11-06: 500 mg via INTRAVENOUS
  Filled 2018-11-06: qty 100

## 2018-11-06 MED ORDER — SODIUM CHLORIDE 0.9 % IV SOLN
2.0000 g | Freq: Three times a day (TID) | INTRAVENOUS | Status: DC
  Administered 2018-11-06 – 2018-11-07 (×4): 2 g via INTRAVENOUS
  Filled 2018-11-06 (×6): qty 2

## 2018-11-06 MED ORDER — KETOROLAC TROMETHAMINE 30 MG/ML IJ SOLN
30.0000 mg | Freq: Four times a day (QID) | INTRAMUSCULAR | Status: DC | PRN
  Administered 2018-11-07 – 2018-11-09 (×3): 30 mg via INTRAVENOUS
  Filled 2018-11-06 (×3): qty 1

## 2018-11-06 MED ORDER — ONDANSETRON HCL 4 MG/2ML IJ SOLN
4.0000 mg | Freq: Four times a day (QID) | INTRAMUSCULAR | Status: DC | PRN
  Administered 2018-11-07 – 2018-11-09 (×4): 4 mg via INTRAVENOUS
  Filled 2018-11-06 (×5): qty 2

## 2018-11-06 MED ORDER — VANCOMYCIN HCL IN DEXTROSE 1-5 GM/200ML-% IV SOLN
1000.0000 mg | Freq: Once | INTRAVENOUS | Status: AC
  Administered 2018-11-06: 1000 mg via INTRAVENOUS
  Filled 2018-11-06: qty 200

## 2018-11-06 MED ORDER — IOHEXOL 300 MG/ML  SOLN
100.0000 mL | Freq: Once | INTRAMUSCULAR | Status: AC | PRN
  Administered 2018-11-06: 100 mL via INTRAVENOUS

## 2018-11-06 MED ORDER — ACETAMINOPHEN 650 MG RE SUPP: 650.0000 mg | Freq: Four times a day (QID) | RECTAL | Status: DC | PRN

## 2018-11-06 MED ORDER — K PHOS MONO-SOD PHOS DI & MONO 155-852-130 MG PO TABS
500.0000 mg | ORAL_TABLET | Freq: Every day | ORAL | Status: DC
  Administered 2018-11-06 – 2018-11-10 (×5): 500 mg via ORAL
  Filled 2018-11-06 (×5): qty 2

## 2018-11-06 MED ORDER — ACETAMINOPHEN 325 MG PO TABS
650.0000 mg | ORAL_TABLET | Freq: Once | ORAL | Status: AC
  Administered 2018-11-06: 650 mg via ORAL
  Filled 2018-11-06: qty 2

## 2018-11-06 MED ORDER — TRAMADOL HCL 50 MG PO TABS
50.0000 mg | ORAL_TABLET | Freq: Four times a day (QID) | ORAL | Status: DC | PRN
  Administered 2018-11-07 – 2018-11-08 (×3): 50 mg via ORAL
  Filled 2018-11-06 (×4): qty 1

## 2018-11-06 MED ORDER — KETOROLAC TROMETHAMINE 30 MG/ML IJ SOLN
30.0000 mg | Freq: Once | INTRAMUSCULAR | Status: AC
  Administered 2018-11-06: 17:00:00 30 mg via INTRAVENOUS
  Filled 2018-11-06: qty 1

## 2018-11-06 NOTE — ED Notes (Signed)
ED TO INPATIENT HANDOFF REPORT  ED Nurse Name and Phone #: William Hamburger, [place]  S Name/Age/Gender Brendan Preston 43 y.o. male Room/Bed: 047C/047C  Code Status   Code Status: Prior  Home/SNF/Other Home Patient oriented to: situation Is this baseline? No   Triage Complete: Triage complete  Chief Complaint POSS INFECTED ARM  Triage Note Pt is IV drug user. Here for left arm swelling for several days. Pt has cyst like region with no redness or drainage to posterior left forearm with swelling noted to the arm.    Allergies Allergies  Allergen Reactions  . Penicillins Other (See Comments)    Reaction not recalled by the patient Did it involve swelling of the face/tongue/throat, SOB, or low BP? Unk Did it involve sudden or severe rash/hives, skin peeling, or any reaction on the inside of your mouth or nose? Unk Did you need to seek medical attention at a hospital or doctor's office? Unk When did it last happen? Unk If all above answers are "NO", may proceed with cephalosporin use.     Level of Care/Admitting Diagnosis ED Disposition    ED Disposition Condition [facility] Area: [place] [[id number]]  Level of Care: Telemetry Medical [104]  Covid Evaluation: Confirmed COVID Negative  Diagnosis: Sepsis [facility]) [[id number]]  Admitting Physician: Toy Baker [[id number]]  Attending Physician: Toy Baker [[id number]]  Estimated length of stay: 3 - 4 days  Certification:: I certify this patient will need inpatient services for at least 2 midnights  PT Class (Do Not Modify): Inpatient [101]  PT Acc Code (Do Not Modify): Private [1]       B Medical/Surgery History Past Medical History:  Diagnosis Date  . Anxiety   . Substance abuse [facility])    Past Surgical History:  Procedure Laterality Date  . HERNIA REPAIR    . I&D EXTREMITY Left 10/13/2014   Procedure: IRRIGATION AND DEBRIDEMENT EXTREMITY;  Surgeon: Leanora Cover, MD;  Location: [facility];   Service: Orthopedics;  Laterality: Left;     A IV Location/Drains/Wounds Patient Lines/Drains/Airways Status   Active Line/Drains/Airways    Name:   Placement date:   Placement time:   Site:   Days:   Peripheral IV 08/04/18 Right Forearm   08/04/18    2222    Forearm   94   Peripheral IV 11/06/18 Right Arm   11/06/18    1620    Arm   less than 1   Peripheral IV 11/06/18 Left Hand   11/06/18    1834    Hand   less than 1          Intake/Output Last 24 hours No intake or output data in the 24 hours ending 11/06/18 2104  Labs/Imaging Results for orders placed or performed during the hospital encounter of 11/06/18 (from the past 48 hour(s))  Comprehensive metabolic panel     Status: Abnormal   Collection Time: 11/06/18  4:11 PM  Result Value Ref Range   Sodium 138 135 - 145 mmol/L   Potassium 3.4 (L) 3.5 - 5.1 mmol/L   Chloride 103 98 - 111 mmol/L   CO2 23 22 - 32 mmol/L   Glucose, Bld 121 (H) 70 - 99 mg/dL   BUN 5 (L) 6 - 20 mg/dL   Creatinine, Ser 0.66 0.61 - 1.24 mg/dL   Calcium 9.1 8.9 - 10.3 mg/dL   Total Protein 7.3 6.5 - 8.1 g/dL   Albumin 3.9 3.5 - 5.0 g/dL  AST 24 15 - 41 U/L   ALT 29 0 - 44 U/L   Alkaline Phosphatase 84 38 - 126 U/L   Total Bilirubin 0.9 0.3 - 1.2 mg/dL   GFR calc non Af Amer >60 >60 mL/min   GFR calc Af Amer >60 >60 mL/min   Anion gap 12 5 - 15    Comment: Performed at [facility], [address], [place], [place] [address]  CBC WITH DIFFERENTIAL     Status: Abnormal   Collection Time: 11/06/18  4:11 PM  Result Value Ref Range   WBC 11.9 (H) 4.0 - 10.5 K/uL   RBC 5.08 4.22 - 5.81 MIL/uL   Hemoglobin 14.7 13.0 - 17.0 g/dL   HCT 60.4 54.0 - 98.1 %   MCV 86.8 80.0 - 100.0 fL   MCH 28.9 26.0 - 34.0 pg   MCHC 33.3 30.0 - 36.0 g/dL   RDW 19.1 47.8 - 29.5 %   Platelets 249 150 - 400 K/uL   nRBC 0.0 0.0 - 0.2 %   Neutrophils Relative % 90 %   Neutro Abs 10.9 (H) 1.7 - 7.7 K/uL   Lymphocytes Relative 6 %   Lymphs Abs 0.7 0.7 - 4.0 K/uL    Monocytes Relative 3 %   Monocytes Absolute 0.3 0.1 - 1.0 K/uL   Eosinophils Relative 0 %   Eosinophils Absolute 0.0 0.0 - 0.5 K/uL   Basophils Relative 0 %   Basophils Absolute 0.0 0.0 - 0.1 K/uL   Immature Granulocytes 1 %   Abs Immature Granulocytes 0.07 0.00 - 0.07 K/uL    Comment: Performed at [facility], [address] [place], [place] [address]  APTT     Status: None   Collection Time: 11/06/18  4:11 PM  Result Value Ref Range   aPTT 28 24 - 36 seconds    Comment: Performed at [facility], [address], [place], [place] [address]  Protime-INR     Status: None   Collection Time: 11/06/18  4:11 PM  Result Value Ref Range   Prothrombin Time 13.9 11.4 - 15.2 seconds   INR 1.1 0.8 - 1.2    Comment: (NOTE) INR goal varies based on device and disease states. Performed at [facility], [address], [place], [place] [address]   Lactic acid, plasma     Status: None   Collection Time: 11/06/18  4:20 PM  Result Value Ref Range   Lactic Acid, Venous 1.2 0.5 - 1.9 mmol/L    Comment: Performed at [facility], [address], [place], [place] [address]  Urinalysis, Routine w reflex microscopic     Status: Abnormal   Collection Time: 11/06/18  4:27 PM  Result Value Ref Range   Color, Urine YELLOW YELLOW   APPearance HAZY (A) CLEAR   Specific Gravity, Urine 1.013 1.005 - 1.030   pH 8.0 5.0 - 8.0   Glucose, UA NEGATIVE NEGATIVE mg/dL   Hgb urine dipstick NEGATIVE NEGATIVE   Bilirubin Urine NEGATIVE NEGATIVE   Ketones, ur 5 (A) NEGATIVE mg/dL   Protein, ur NEGATIVE NEGATIVE mg/dL   Nitrite NEGATIVE NEGATIVE   Leukocytes,Ua NEGATIVE NEGATIVE    Comment: Performed at [facility], [address] [place], [place] [address]  SARS Coronavirus 2 [facility] order, Performed in [facility] hospital lab)     Status: None   Collection Time: 11/06/18  4:27 PM  Result Value Ref Range  SARS Coronavirus 2 NEGATIVE NEGATIVE    Comment: (NOTE) If result  is NEGATIVE SARS-CoV-2 target nucleic acids are NOT DETECTED. The SARS-CoV-2 RNA is generally detectable in upper and lower  respiratory specimens during the acute phase of infection. The lowest  concentration of SARS-CoV-2 viral copies this assay can detect is 250  copies / mL. A negative result does not preclude SARS-CoV-2 infection  and should not be used as the sole basis for treatment or other  patient management decisions.  A negative result may occur with  improper specimen collection / handling, submission of specimen other  than nasopharyngeal swab, presence of viral mutation(s) within the  areas targeted by this assay, and inadequate number of viral copies  (<250 copies / mL). A negative result must be combined with clinical  observations, patient history, and epidemiological information. If result is POSITIVE SARS-CoV-2 target nucleic acids are DETECTED. The SARS-CoV-2 RNA is generally detectable in upper and lower  respiratory specimens dur ing the acute phase of infection.  Positive  results are indicative of active infection with SARS-CoV-2.  Clinical  correlation with patient history and other diagnostic information is  necessary to determine patient infection status.  Positive results do  not rule out bacterial infection or co-infection with other viruses. If result is PRESUMPTIVE POSTIVE SARS-CoV-2 nucleic acids MAY BE PRESENT.   A presumptive positive result was obtained on the submitted specimen  and confirmed on repeat testing.  While 2019 novel coronavirus  (SARS-CoV-2) nucleic acids may be present in the submitted sample  additional confirmatory testing may be necessary for epidemiological  and / or clinical management purposes  to differentiate between  SARS-CoV-2 and other Sarbecovirus currently known to infect humans.  If clinically indicated additional testing with an alternate test  methodology [id number]) is advised. The SARS-CoV-2 RNA is generally   detectable in upper and lower respiratory sp ecimens during the acute  phase of infection. The expected result is Negative. Fact Sheet for Patients:  BoilerBrush.com.cyhttps://www.fda.gov/media/136312/download Fact Sheet for Healthcare Providers: https://pope.com/https://www.fda.gov/media/136313/download This test is not yet approved or cleared by the [place] FDA and has been authorized for detection and/or diagnosis of SARS-CoV-2 by FDA under an Emergency Use Authorization (EUA).  This EUA will remain in effect (meaning this test can be used) for the duration of the COVID-19 declaration under Section 564(b)(1) of the Act, 21 U.S.C. section 360bbb-3(b)(1), unless the authorization is terminated or revoked sooner. Performed at [facility], [address] [place], [place] [address]   Magnesium     Status: None   Collection Time: 11/06/18  7:25 PM  Result Value Ref Range   Magnesium 2.1 1.7 - 2.4 mg/dL    Comment: Performed at [facility], [address] [place], [place] [address]  Phosphorus     Status: Abnormal   Collection Time: 11/06/18  7:25 PM  Result Value Ref Range   Phosphorus 2.0 (L) 2.5 - 4.6 mg/dL    Comment: Performed at [facility], [address] [place], [place] [address]  Procalcitonin     Status: None   Collection Time: 11/06/18  7:25 PM  Result Value Ref Range   Procalcitonin 0.55 ng/mL    Comment:        Interpretation: PCT > 0.5 ng/mL and <= 2 ng/mL: Systemic infection (sepsis) is possible, but other conditions are known to elevate PCT as well. (NOTE)       Sepsis PCT Algorithm  Lower Respiratory Tract                                      Infection PCT Algorithm    ----------------------------     ----------------------------         PCT < 0.25 ng/mL                PCT < 0.10 ng/mL         Strongly encourage             Strongly discourage   discontinuation of antibiotics    initiation of antibiotics    ----------------------------      -----------------------------       PCT 0.25 - 0.50 ng/mL            PCT 0.10 - 0.25 ng/mL               OR       >80% decrease in PCT            Discourage initiation of                                            antibiotics      Encourage discontinuation           of antibiotics    ----------------------------     -----------------------------         PCT >= 0.50 ng/mL              PCT 0.26 - 0.50 ng/mL                AND       <80% decrease in PCT             Encourage initiation of                                             antibiotics       Encourage continuation           of antibiotics    ----------------------------     -----------------------------        PCT >= 0.50 ng/mL                  PCT > 0.50 ng/mL               AND         increase in PCT                  Strongly encourage                                      initiation of antibiotics    Strongly encourage escalation           of antibiotics                                     -----------------------------  PCT <= 0.25 ng/mL                                                 OR                                        > 80% decrease in PCT                                     Discontinue / Do not initiate                                             antibiotics Performed at [facility], [address] [place], [place] [address]    Ct Forearm Left W Contrast  Result Date: 11/06/2018 CLINICAL DATA:  Soft tissue swelling. Cellulitis. Skin infection. Osteomyelitis suspected. EXAM: CT OF THE UPPER LEFT EXTREMITY WITH CONTRAST TECHNIQUE: Multidetector CT imaging of the upper left extremity was performed according to the standard protocol following intravenous contrast administration. COMPARISON:  None. CONTRAST:  100mL OMNIPAQUE IOHEXOL 300 MG/ML  SOLN FINDINGS: Bones/Joint/Cartilage There is no displaced fracture. There is no CT evidence for osteomyelitis. Ligaments  Suboptimally assessed by CT. Muscles and Tendons Unremarkable. Soft tissues There is soft tissue swelling about the forearm. There is no subcutaneous gas. There is no radiopaque foreign body. There is no significant fluid collection that would be concerning for an abscess. Evaluation was limited by significant motion artifact resulting in a suboptimal contrast bolus timing on repeat imaging. IMPRESSION: 1. Exam was limited by motion artifact resulting in suboptimal contrast timing. 2. Soft tissue swelling about the forearm which can be seen in patients with cellulitis. There is no drainable fluid collection. There is no subcutaneous gas. 3. No displaced fracture. No CT evidence for osteomyelitis. No radiopaque foreign body. Electronically Signed   By: Katherine Mantlehristopher  Green M.D.   On: 11/06/2018 20:11   Dg Chest Port 1 View  Result Date: 11/06/2018 CLINICAL DATA:  Sepsis. EXAM: PORTABLE CHEST 1 VIEW COMPARISON:  PA and lateral chest 02/27/2012. Single-view of the chest 10/14/2014. FINDINGS: Punctate calcified granulomata are seen. No consolidative process, pneumothorax or effusion. Heart size is normal. IMPRESSION: No acute disease. Electronically Signed   By: Drusilla Kannerhomas  Dalessio M.D.   On: 11/06/2018 16:31    Pending Labs Unresulted Labs (From admission, onward)    Start     Ordered   11/06/18 2104  MRSA PCR Screening  Once,   STAT    Question:  Patient immune status  Answer:  Normal   11/06/18 2103   11/06/18 1924  Urine rapid drug screen (hosp performed)  Add-on,   AD     11/06/18 1923   11/06/18 1611  Lactic acid, plasma  Now then every 2 hours,   STAT     11/06/18 1612   11/06/18 1611  Blood Culture (routine x 2)  BLOOD CULTURE X 2,   STAT     11/06/18 1612   11/06/18 1611  Urine culture  ONCE - STAT,   STAT  11/06/18 1612          Vitals/Pain Today's Vitals   11/06/18 1800 11/06/18 1815 11/06/18 1844 11/06/18 2035  BP: 115/62 (!) 105/58  109/61  Pulse:    84  Resp: (!) 24 (!) 28  17   Temp:    99.9 F (37.7 C)  TempSrc:   Oral Oral  SpO2:    97%  Weight:      Height:      PainSc:        Isolation Precautions No active isolations  Medications Medications  vancomycin (VANCOCIN) 500 mg in sodium chloride 0.9 % 100 mL IVPB (has no administration in time range)  vancomycin (VANCOCIN) 1,250 mg in sodium chloride 0.9 % 250 mL IVPB (has no administration in time range)  ceFEPIme (MAXIPIME) 2 g in sodium chloride 0.9 % 100 mL IVPB (has no administration in time range)  metroNIDAZOLE (FLAGYL) IVPB 500 mg (has no administration in time range)  metroNIDAZOLE (FLAGYL) IVPB 500 mg (has no administration in time range)  phosphorus (K PHOS NEUTRAL) tablet 500 mg (has no administration in time range)  sodium chloride 0.9 % bolus 1,000 mL (0 mLs Intravenous Stopped 11/06/18 1716)    And  sodium chloride 0.9 % bolus 1,000 mL (0 mLs Intravenous Stopped 11/06/18 1833)    And  sodium chloride 0.9 % bolus 250 mL (0 mLs Intravenous Stopped 11/06/18 1807)  vancomycin (VANCOCIN) IVPB 1000 mg/200 mL premix (0 mg Intravenous Stopped 11/06/18 1833)  ketorolac (TORADOL) 30 MG/ML injection 30 mg (30 mg Intravenous Given 11/06/18 1648)  ceFEPIme (MAXIPIME) 2 g in sodium chloride 0.9 % 100 mL IVPB (0 g Intravenous Stopped 11/06/18 1720)  acetaminophen (TYLENOL) tablet 650 mg (650 mg Oral Given 11/06/18 1812)  iohexol (OMNIPAQUE) 300 MG/ML solution 100 mL (100 mLs Intravenous Contrast Given 11/06/18 2004)    Mobility walks Low fall risk   Focused Assessments Skin: left arm swelling +redness   R Recommendations: See Admitting Provider Note  Report given to:   Additional Notes:

## 2018-11-06 NOTE — ED Notes (Signed)
ct 

## 2018-11-06 NOTE — H&P (Signed)
Brendan Preston SRP:[id number] DOB: 1975-04-23 DOA: 11/06/2018     PCP: Patient, No Pcp Per   Outpatient Specialists:  NONE    Patient arrived to ER on 11/06/18 at 1418  Patient coming from: home Lives alone,      Chief Complaint:  Chief Complaint  Patient presents with  . Arm Swelling    HPI: [name] is a 43 y.o. male with medical history significant of IVD abuse, substance abuse recurrent cellulitis     Presented with left arm swelling for several days and to injecting substances noted to have significant left arm swelling and a fever up to 103 Pain from MI is getting worse the past 4 days has had some chills and body aches fatigue decreased appetite otherwise no chest pain or shortness of breath no respiratory complaints   Infectious risk factors:  Reports  fever,    In  ER RAPID COVID TEST NEGATIVE     While in ER: To be tachycardic up to 105 and febrile up to 103 With significant left lower extremity cellulitis likely secondary to drug injection Started on cefepime and vancomycin Blood cultures obtained  The following Work up has been ordered so far:  Orders Placed This Encounter  Procedures  . Critical Care  . Blood Culture (routine x 2)  . Urine culture  . SARS Coronavirus 2 [facility] order, Performed in [facility] hospital lab)  . DG Chest Port 1 View  . CT FOREARM LEFT W CONTRAST  . Lactic acid, plasma  . Comprehensive metabolic panel  . CBC WITH DIFFERENTIAL  . APTT  . Protime-INR  . Urinalysis, Routine w reflex microscopic  . Magnesium  . Urine rapid drug screen (hosp performed)  . Phosphorus  . Procalcitonin  . Diet NPO time specified  . Cardiac monitoring  . Refer to Sidebar Report: Sepsis Bundle ED/IP  . Document vital signs within 1-hour of fluid bolus completion and notify provider of bolus completion  . Document Actual / Estimated Weight  . Insert peripheral IV x 2  . Initiate Carrier Fluid Protocol  . Check Rectal Temperature   . Re-check Vital Signs  . Re-check Vital Signs  . If lactate (lactic acid) >2, verify repeat lactic acid order has been placed to be drawn  . Document vital signs within 1-hour of fluid bolus completion and notify provider of bolus completion  . Vital signs  . Vital signs  . Refer to Sidebar Report: Sepsis Sidebar  . Cardiac Monitoring Continuous x 24 hours Indications for use: Other; other indications for use: sepsis  . Cardiac monitoring  . Initiate Code Sepsis ([facility] [phone]) Reason for Consult? tracking  . pharmacy consult  . vancomycin per pharmacy consult  . ceFEPIme (MAXIPIME) per pharmacy consult  . Consult for [place] Admission  ALL PATIENTS BEING ADMITTED/HAVING PROCEDURES NEED COVID-19 SCREENING  . Pulse oximetry, continuous  . ED EKG 12-Lead  . EKG 12-Lead  . Admit to Inpatient (patient's expected length of stay will be greater than 2 midnights or inpatient only procedure)     Following Medications were ordered in ER: Medications  vancomycin (VANCOCIN) 500 mg in sodium chloride 0.9 % 100 mL IVPB (has no administration in time range)  vancomycin (VANCOCIN) 1,250 mg in sodium chloride 0.9 % 250 mL IVPB (has no administration in time range)  ceFEPIme (MAXIPIME) 2 g in sodium chloride 0.9 % 100 mL IVPB (has no administration in time range)  metroNIDAZOLE (FLAGYL) IVPB 500 mg (has no  administration in time range)  metroNIDAZOLE (FLAGYL) IVPB 500 mg (has no administration in time range)  phosphorus (K PHOS NEUTRAL) tablet 500 mg (has no administration in time range)  sodium chloride 0.9 % bolus 1,000 mL (0 mLs Intravenous Stopped 11/06/18 1716)    And  sodium chloride 0.9 % bolus 1,000 mL (0 mLs Intravenous Stopped 11/06/18 1833)    And  sodium chloride 0.9 % bolus 250 mL (0 mLs Intravenous Stopped 11/06/18 1807)  vancomycin (VANCOCIN) IVPB 1000 mg/200 mL premix (0 mg Intravenous Stopped 11/06/18 1833)  ketorolac (TORADOL) 30 MG/ML injection 30 mg (30  mg Intravenous Given 11/06/18 1648)  ceFEPIme (MAXIPIME) 2 g in sodium chloride 0.9 % 100 mL IVPB (0 g Intravenous Stopped 11/06/18 1720)  acetaminophen (TYLENOL) tablet 650 mg (650 mg Oral Given 11/06/18 1812)  iohexol (OMNIPAQUE) 300 MG/ML solution 100 mL (100 mLs Intravenous Contrast Given 11/06/18 2004)      Significant initial  Findings: Abnormal Labs Reviewed  COMPREHENSIVE METABOLIC PANEL - Abnormal; Notable for the following components:      Result Value   Potassium 3.4 (*)    Glucose, Bld 121 (*)    BUN 5 (*)    All other components within normal limits  CBC WITH DIFFERENTIAL/PLATELET - Abnormal; Notable for the following components:   WBC 11.9 (*)    Neutro Abs 10.9 (*)    All other components within normal limits  URINALYSIS, ROUTINE W REFLEX MICROSCOPIC - Abnormal; Notable for the following components:   APPearance HAZY (*)    Ketones, ur 5 (*)    All other components within normal limits  PHOSPHORUS - Abnormal; Notable for the following components:   Phosphorus 2.0 (*)    All other components within normal limits     Otherwise labs showing:    Recent Labs  Lab 11/06/18 1611 11/06/18 1925  NA 138  --   K 3.4*  --   CO2 23  --   GLUCOSE 121*  --   BUN 5*  --   CREATININE 0.66  --   CALCIUM 9.1  --   MG  --  2.1  PHOS  --  2.0*    Cr  stable,    Lab Results  Component Value Date   CREATININE 0.66 11/06/2018   CREATININE 0.82 10/14/2014   CREATININE 0.96 10/13/2014    Recent Labs  Lab 11/06/18 1611  AST 24  ALT 29  ALKPHOS 84  BILITOT 0.9  PROT 7.3  ALBUMIN 3.9   Lab Results  Component Value Date   CALCIUM 9.1 11/06/2018   PHOS 2.0 (L) 11/06/2018      WBC      Component Value Date/Time   WBC 11.9 (H) 11/06/2018 1611   ANC    Component Value Date/Time   NEUTROABS 10.9 (H) 11/06/2018 1611   ALC No results found for: LYMPHOABS    Plt: Lab Results  Component Value Date   PLT 249 11/06/2018     Lactic Acid, Venous     Component Value Date/Time   LATICACIDVEN 1.2 11/06/2018 1620       COVID-19 Labs    Lab Results  Component Value Date   SARSCOV2NAA NEGATIVE 11/06/2018       HG/HCT   Stable,     Component Value Date/Time   HGB 14.7 11/06/2018 1611   HCT 44.1 11/06/2018 1611        UA   no evidence of UTI     Urine analysis:  Component Value Date/Time   COLORURINE YELLOW 11/06/2018 1627   APPEARANCEUR HAZY (A) 11/06/2018 1627   LABSPEC 1.013 11/06/2018 1627   PHURINE 8.0 11/06/2018 1627   GLUCOSEU NEGATIVE 11/06/2018 1627   HGBUR NEGATIVE 11/06/2018 1627   BILIRUBINUR NEGATIVE 11/06/2018 1627   KETONESUR 5 (A) 11/06/2018 1627   PROTEINUR NEGATIVE 11/06/2018 1627   UROBILINOGEN 0.2 10/14/2014 1112   NITRITE NEGATIVE 11/06/2018 1627   LEUKOCYTESUR NEGATIVE 11/06/2018 1627      CT LEFT ARM no foreign bodies or abscess  CXR -  NON acute     ECG:  Personally reviewed by me showing: HR : 98  Rhythm:  NSR   nonspecific changes,  QTC 4428      ED Triage Vitals  Enc Vitals Group     BP 11/06/18 1423 (!) 133/92     Pulse Rate 11/06/18 1423 98     Resp 11/06/18 1423 18     Temp 11/06/18 1423 99 F (37.2 C)     Temp Source 11/06/18 1423 Oral     SpO2 11/06/18 1423 100 %     Weight 11/06/18 1648 148 lb (67.1 kg)     Height 11/06/18 1648 '5\' 11"'  (1.803 m)     Head Circumference --      Peak Flow --      Pain Score 11/06/18 1428 8     Pain Loc --      Pain Edu? --      Excl. in [place]? --   TMAX(24)@       Latest  Blood pressure (!) 105/58, pulse 99, temperature (!) 103 F (39.4 C), temperature source Rectal, resp. rate (!) 28, height '5\' 11"'  (1.803 m), weight 67.1 kg, SpO2 97 %.    Hospitalist was called for admission for Sepsis due to left arm cellulitis   Review of Systems:    Pertinent positives include:  Fevers, chills, fatigue,  Constitutional:  No weight loss, night sweats weight loss  HEENT:  No headaches, Difficulty swallowing,Tooth/dental problems,Sore  throat,  No sneezing, itching, ear ache, nasal congestion, post nasal drip,  Cardio-vascular:  No chest pain, Orthopnea, PND, anasarca, dizziness, palpitations.no Bilateral lower extremity swelling  GI:  No heartburn, indigestion, abdominal pain, nausea, vomiting, diarrhea, change in bowel habits, loss of appetite, melena, blood in stool, hematemesis Resp:  no shortness of breath at rest. No dyspnea on exertion, No excess mucus, no productive cough, No non-productive cough, No coughing up of blood.No change in color of mucus.No wheezing. Skin:  no rash or lesions. No jaundice GU:  no dysuria, change in color of urine, no urgency or frequency. No straining to urinate.  No flank pain.  Musculoskeletal:  No joint pain or no joint swelling. No decreased range of motion. No back pain.  Psych:  No change in mood or affect. No depression or anxiety. No memory loss.  Neuro: no localizing neurological complaints, no tingling, no weakness, no double vision, no gait abnormality, no slurred speech, no confusion  All systems reviewed and apart from [place] all are negative  Past Medical History:   Past Medical History:  Diagnosis Date  . Anxiety   . Substance abuse [facility])       Past Surgical History:  Procedure Laterality Date  . HERNIA REPAIR    . I&D EXTREMITY Left 10/13/2014   Procedure: IRRIGATION AND DEBRIDEMENT EXTREMITY;  Surgeon: Leanora Cover, MD;  Location: [place];  Service: Orthopedics;  Laterality: Left;    Social History:  Ambulatory   independently     reports that he has been smoking cigarettes. He has been smoking about 0.50 packs per day. He has never used smokeless tobacco. He reports current alcohol use. He reports current drug use. Drug: Marijuana.     Family History:   Family History  Problem Relation Age of Onset  . Cancer Other     Allergies: Allergies  Allergen Reactions  . Penicillins Other (See Comments)    Reaction not recalled by the patient Did it  involve swelling of the face/tongue/throat, SOB, or low BP? Unk Did it involve sudden or severe rash/hives, skin peeling, or any reaction on the inside of your mouth or nose? Unk Did you need to seek medical attention at a hospital or doctor's office? Unk When did it last happen? Unk If all above answers are "NO", may proceed with cephalosporin use.      Prior to Admission medications   Medication Sig Start Date End Date Taking? Authorizing Provider  amoxicillin-clavulanate (AUGMENTIN) 875-125 MG per tablet Take 1 tablet by mouth 2 (two) times daily. Patient not taking: Reported on 08/05/2018 10/16/14   Charlynne Cousins, MD  benzonatate (TESSALON) 100 MG capsule Take 1-2 capsules (100-200 mg total) by mouth 3 (three) times daily as needed for cough. Patient not taking: Reported on 10/13/2014 04/16/13   Darlyne Russian, MD  doxycycline (VIBRAMYCIN) 100 MG capsule Take 1 capsule (100 mg total) by mouth 2 (two) times daily. Patient not taking: Reported on 08/05/2018 10/01/16   Montine Circle, PA-C  HYDROcodone-acetaminophen [facility]) 7.5-325 MG per tablet Take 1 tablet by mouth every 6 (six) hours as needed for moderate pain. Patient not taking: Reported on 08/05/2018 10/16/14   Charlynne Cousins, MD  naloxone [facility]) nasal spray 4 mg/0.1 mL Use as needed for opiate overdose 08/05/18   Ward, Delice Bison, DO   Physical Exam: Blood pressure (!) 105/58, pulse 99, temperature (!) 103 F (39.4 C), temperature source Rectal, resp. rate (!) 28, height '5\' 11"'  (1.803 m), weight 67.1 kg, SpO2 97 %. 1. General:  in No  Acute distress   acutely ill-appearing 2. Psychological:sleepy but   Oriented 3. Head/ENT:    Dry Mucous Membranes                          Head Non traumatic, neck supple                           Poor Dentition 4. SKIN:  decreased Skin turgor,  Skin clean Dry Erythema of left arm noted with swelling  5. Heart: Regular rate and rhythm no  Murmur, no Rub or gallop 6. Lungs:  no wheezes or  crackles   7. Abdomen: Soft,  non-tender, Non distended  8. Lower extremities: no clubbing, cyanosis, no edema 9. Neurologically Grossly intact, moving all 4 extremities equally  10. MSK: Normal range of motion limited in Left arm due to pain    All other LABS:     Recent Labs  Lab 11/06/18 1611  WBC 11.9*  NEUTROABS 10.9*  HGB 14.7  HCT 44.1  MCV 86.8  PLT 249     Recent Labs  Lab 11/06/18 1611 11/06/18 1925  NA 138  --   K 3.4*  --   CL 103  --   CO2 23  --   GLUCOSE 121*  --   BUN 5*  --  CREATININE 0.66  --   CALCIUM 9.1  --   MG  --  2.1  PHOS  --  2.0*     Recent Labs  Lab 11/06/18 1611  AST 24  ALT 29  ALKPHOS 84  BILITOT 0.9  PROT 7.3  ALBUMIN 3.9       Cultures:    Component Value Date/Time   SDES ABSCESS LEFT ARM 10/14/2014 0007   SDES ABSCESS LEFT ARM 10/14/2014 0007   SPECREQUEST NONE 10/14/2014 0007   SPECREQUEST NONE 10/14/2014 0007   CULT  10/14/2014 0007    NO ANAEROBES ISOLATED Performed at [place]  10/14/2014 0007    FEW MICROAEROPHILIC STREPTOCOCCI Note: Standardized susceptibility testing for this organism is not available. Performed at [place] 10/19/2014 FINAL 10/14/2014 0007   REPTSTATUS 10/16/2014 FINAL 10/14/2014 0007     Radiological Exams on Admission: Ct Forearm Left W Contrast  Result Date: 11/06/2018 CLINICAL DATA:  Soft tissue swelling. Cellulitis. Skin infection. Osteomyelitis suspected. EXAM: CT OF THE UPPER LEFT EXTREMITY WITH CONTRAST TECHNIQUE: Multidetector CT imaging of the upper left extremity was performed according to the standard protocol following intravenous contrast administration. COMPARISON:  None. CONTRAST:  165m OMNIPAQUE IOHEXOL 300 MG/ML  SOLN FINDINGS: Bones/Joint/Cartilage There is no displaced fracture. There is no CT evidence for osteomyelitis. Ligaments Suboptimally assessed by CT. Muscles and Tendons Unremarkable. Soft tissues There is soft  tissue swelling about the forearm. There is no subcutaneous gas. There is no radiopaque foreign body. There is no significant fluid collection that would be concerning for an abscess. Evaluation was limited by significant motion artifact resulting in a suboptimal contrast bolus timing on repeat imaging. IMPRESSION: 1. Exam was limited by motion artifact resulting in suboptimal contrast timing. 2. Soft tissue swelling about the forearm which can be seen in patients with cellulitis. There is no drainable fluid collection. There is no subcutaneous gas. 3. No displaced fracture. No CT evidence for osteomyelitis. No radiopaque foreign body. Electronically Signed   By: CConstance HolsterM.D.   On: 11/06/2018 20:11   Dg Chest Port 1 View  Result Date: 11/06/2018 CLINICAL DATA:  Sepsis. EXAM: PORTABLE CHEST 1 VIEW COMPARISON:  PA and lateral chest 02/27/2012. Single-view of the chest 10/14/2014. FINDINGS: Punctate calcified granulomata are seen. No consolidative process, pneumothorax or effusion. Heart size is normal. IMPRESSION: No acute disease. Electronically Signed   By: TInge RiseM.D.   On: 11/06/2018 16:31    Chart has been reviewed    Assessment/Plan  43y.o. male with medical history significant of IVD abuse, substance abuse recurrent cellulitis   Admitted for sepsis due to left arm celluliits  Present on Admission: . [place] ([place] - social work consult, counseling regarding stopping IV drug use  . Cellulitis of left upper arm -      continue current antibiotic choice cefepime metronidazole vancomycin for possibly multi-bacterial infection      plain films showed:  no evidence of air  no evidence of osteomyelitis  no     foreign   objects       Will obtain MRSA screening,     obtain blood cultures       further antibiotic adjustment pending above results  . Hypokalemia - - will replace and repeat in AM,  check magnesium level and replace as needed  . Sepsis due to cellulitis  ([place] -  -SIRS criteria met with  elevated white blood  cell count, tachycardia ,   fever.   -Most likely source being:cellulitis  - Obtain serial lactic acid and procalcitonin level.  - Initiate IV antibiotics   - await results of blood and urine culture  - Rehydrate aggressively    . Tobacco abuse -  - Spoke about importance of quitting spent 5 minutes discussing options for treatment,    -At this point patient is   NOT  interested in quitting  - refused nicotine patch   - nursing tobacco cessation protocol    Other plan as per orders.  DVT prophylaxis:  SCD   Code Status:  FULL CODE   Family Communication:   Family not at  Bedside    Disposition Plan:   To home once workup is complete and patient is stable                                         Social Work  consulted                                        Consults called none  Admission status:  ED Disposition    ED Disposition Condition [place]: [place] [[id number]]  Level of Care: Telemetry Medical [104]  Covid Evaluation: Confirmed COVID Negative  Diagnosis: Sepsis [facility]) [[id number]]  Admitting Physician: Toy Baker [[id number]]  Attending Physician: Toy Baker [[id number]]  Estimated length of stay: 3 - 4 days  Certification:: I certify this patient will need inpatient services for at least 2 midnights  PT Class (Do Not Modify): Inpatient [101]  PT Acc Code (Do Not Modify): Private [1]         inpatient     Expect 2 midnight stay secondary to severity of patient's current illness including    and extensive comorbidities including: .  substance abuse    That are currently affecting medical management.   I expect  patient to be hospitalized for 2 midnights requiring inpatient medical care.  Patient is at high risk for adverse outcome (such as loss of life or disability) if not treated.  Indication for inpatient stay as follows:    severe pain requiring  acute inpatient management,    Need for IV antibiotics, IV fluids,      Level of care    tele  For 12H    Precautions: NONE  No active isolations  PPE: Used by the provider:   P100  eye Goggles,  Gloves     Bannon Giammarco 11/06/2018, 9:02 PM    [facility]     after 2 AM please page floor coverage PA If 7AM-7PM, please contact the day team taking care of the patient using [email or web address]

## 2018-11-06 NOTE — ED Triage Notes (Signed)
Pt is IV drug user. Here for left arm swelling for several days. Pt has cyst like region with no redness or drainage to posterior left forearm with swelling noted to the arm.

## 2018-11-06 NOTE — ED Provider Notes (Signed)
[facility] Provider Note   CSN: [id number] Arrival date & time: 11/06/18  1418     History   Chief Complaint Chief Complaint  Patient presents with   Arm Swelling    HPI Brendan Preston is a 43 y.o. male.     The history is provided by the patient. No language interpreter was used.     43 year old male with history of IV drug use presenting for evaluation of left arm pain.  Patient report gradual onset of throbbing pain about his left arm ongoing for the past 4 days.  Pain is now moderate to severe, persistent, worsening with movement or with palpation.  He endorsed subjective fever, chills, body aches, generalized weakness and decreased appetite.  He admits to IV drug use several days prior using the same arm.  He is up-to-date with tetanus.  He denies any runny nose sneezing coughing or any COVID-19 symptoms.  Denies any recent sick contact.  Denies any recent injury.  Past Medical History:  Diagnosis Date   Anxiety    Substance abuse [facility](HCC)     Patient Active Problem List   Diagnosis Date Noted   Heroin abuse (HCC) 10/13/2014   Cellulitis of left upper arm 10/13/2014   Hypokalemia 10/13/2014   Hyponatremia 10/13/2014   Sepsis due to cellulitis (HCC) 10/13/2014   Substance abuse (HCC) 04/16/2013    Past Surgical History:  Procedure Laterality Date   HERNIA REPAIR     I&D EXTREMITY Left 10/13/2014   Procedure: IRRIGATION AND DEBRIDEMENT EXTREMITY;  Surgeon: Betha LoaKevin Kuzma, MD;  Location: [facility] OR;  Service: Orthopedics;  Laterality: Left;        Home Medications    Prior to Admission medications   Medication Sig Start Date End Date Taking? Authorizing Provider  amoxicillin-clavulanate (AUGMENTIN) 875-125 MG per tablet Take 1 tablet by mouth 2 (two) times daily. Patient not taking: Reported on 08/05/2018 10/16/14   Marinda ElkFeliz Ortiz, Abraham, MD  benzonatate (TESSALON) 100 MG capsule Take 1-2 capsules (100-200 mg total) by  mouth 3 (three) times daily as needed for cough. Patient not taking: Reported on 10/13/2014 04/16/13   Collene Gobbleaub, Steven A, MD  doxycycline (VIBRAMYCIN) 100 MG capsule Take 1 capsule (100 mg total) by mouth 2 (two) times daily. Patient not taking: Reported on 08/05/2018 10/01/16   Roxy HorsemanBrowning, Robert, PA-C  HYDROcodone-acetaminophen [facility](NORCO) 7.5-325 MG per tablet Take 1 tablet by mouth every 6 (six) hours as needed for moderate pain. Patient not taking: Reported on 08/05/2018 10/16/14   Marinda ElkFeliz Ortiz, Abraham, MD  naloxone [facility](NARCAN) nasal spray 4 mg/0.1 mL Use as needed for opiate overdose 08/05/18   Ward, Layla MawKristen N, DO    Family History No family history on file.  Social History Social History   Tobacco Use   Smoking status: Current Every Day Smoker    Packs/day: 0.50    Types: Cigarettes   Smokeless tobacco: Never Used  Substance Use Topics   Alcohol use: Yes    Comment: Once a week.    Drug use: Yes    Types: Marijuana    Comment: Hx of Heroin but denies currently. Last used: Marijuana Yesterday      Allergies   Penicillins   Review of Systems Review of Systems  All other systems reviewed and are negative.    Physical Exam Updated Vital Signs BP (!) 133/92 (BP Location: Right Arm)    Pulse 98    Temp 99 F (37.2 C) (Oral)    Resp  18    SpO2 100%   Physical Exam Vitals signs and nursing note reviewed.  Constitutional:      General: He is not in acute distress.    Appearance: He is well-developed. He is ill-appearing.  HENT:     Head: Atraumatic.  Eyes:     Conjunctiva/sclera: Conjunctivae normal.  Neck:     Musculoskeletal: Neck supple.  Cardiovascular:     Rate and Rhythm: Tachycardia present.     Pulses: Normal pulses.     Heart sounds: Murmur present. No friction rub.  Pulmonary:     Effort: Pulmonary effort is normal.     Breath sounds: Normal breath sounds.  Abdominal:     Palpations: Abdomen is soft.  Skin:    Findings: Erythema (Left arm: Left forearm is  edematous indurated and tender to palpation without significant erythema.  Radial pulse 2+.  Track marks noted along left antecubital region) present. No rash.  Neurological:     Mental Status: He is alert. Mental status is at baseline.  Psychiatric:        Mood and Affect: Mood normal.      ED Treatments / Results  Labs (all labs ordered are listed, but only abnormal results are displayed) Labs Reviewed  COMPREHENSIVE METABOLIC PANEL - Abnormal; Notable for the following components:      Result Value   Potassium 3.4 (*)    Glucose, Bld 121 (*)    BUN 5 (*)    All other components within normal limits  CBC WITH DIFFERENTIAL/PLATELET - Abnormal; Notable for the following components:   WBC 11.9 (*)    Neutro Abs 10.9 (*)    All other components within normal limits  URINALYSIS, ROUTINE W REFLEX MICROSCOPIC - Abnormal; Notable for the following components:   APPearance HAZY (*)    Ketones, ur 5 (*)    All other components within normal limits  SARS CORONAVIRUS 2 (HOSPITAL ORDER, [place] [facility])  CULTURE, BLOOD (ROUTINE X 2)  CULTURE, BLOOD (ROUTINE X 2)  URINE CULTURE  LACTIC ACID, PLASMA  APTT  PROTIME-INR  LACTIC ACID, PLASMA    EKG EKG Interpretation  Date/Time:  Friday November 06 2018 17:03:40 EDT Ventricular Rate:  98 PR Interval:    QRS Duration: 89 QT Interval:  335 QTC Calculation: 428 R Axis:   58 Text Interpretation:  Sinus rhythm Borderline short PR interval RSR' in V1 or V2, probably normal variant Minimal ST depression, lateral leads Confirmed by Lennice Sites [phone]) on 11/06/2018 5:30:12 PM   Radiology Dg Chest Port 1 View  Result Date: 11/06/2018 CLINICAL DATA:  Sepsis. EXAM: PORTABLE CHEST 1 VIEW COMPARISON:  PA and lateral chest 02/27/2012. Single-view of the chest 10/14/2014. FINDINGS: Punctate calcified granulomata are seen. No consolidative process, pneumothorax or effusion. Heart size is normal. IMPRESSION: No acute disease.  Electronically Signed   By: Inge Rise M.D.   On: 11/06/2018 16:31    Procedures .Critical Care Performed by: Domenic Moras, PA-C Authorized by: Domenic Moras, PA-C   Critical care provider statement:    Critical care time (minutes):  45   Critical care was time spent personally by me on the following activities:  Discussions with consultants, evaluation of patient's response to treatment, examination of patient, ordering and performing treatments and interventions, ordering and review of laboratory studies, ordering and review of radiographic studies, pulse oximetry, re-evaluation of patient's condition, obtaining history from patient or surrogate and review of old charts   (including  critical care time)  Medications Ordered in ED Medications  sodium chloride 0.9 % bolus 1,000 mL (1,000 mLs Intravenous New Bag/Given 11/06/18 1646)    And  sodium chloride 0.9 % bolus 1,000 mL (1,000 mLs Intravenous New Bag/Given 11/06/18 1721)    And  sodium chloride 0.9 % bolus 250 mL (has no administration in time range)  vancomycin (VANCOCIN) 500 mg in sodium chloride 0.9 % 100 mL IVPB (has no administration in time range)  vancomycin (VANCOCIN) 1,250 mg in sodium chloride 0.9 % 250 mL IVPB (has no administration in time range)  ceFEPIme (MAXIPIME) 2 g in sodium chloride 0.9 % 100 mL IVPB (has no administration in time range)  vancomycin (VANCOCIN) IVPB 1000 mg/200 mL premix (1,000 mg Intravenous New Bag/Given 11/06/18 1719)  ketorolac (TORADOL) 30 MG/ML injection 30 mg (30 mg Intravenous Given 11/06/18 1648)  ceFEPIme (MAXIPIME) 2 g in sodium chloride 0.9 % 100 mL IVPB (0 g Intravenous Stopped 11/06/18 1720)  acetaminophen (TYLENOL) tablet 650 mg (650 mg Oral Given 11/06/18 1812)     Initial Impression / Assessment and Plan / ED Course  I have reviewed the triage vital signs and the nursing notes.  Pertinent labs & imaging results that were available during my care of the patient were reviewed by me  and considered in my medical decision making (see chart for details).        BP 137/80    Pulse (!) 105    Temp (!) 103 F (39.4 C) (Rectal)    Resp 16    Ht 5\' 11"  (1.803 m)    Wt 67.1 kg    SpO2 97%    BMI 20.64 kg/m    Final Clinical Impressions(s) / ED Diagnoses   Final diagnoses:  Cellulitis of arm, left  Sepsis, due to unspecified organism, unspecified whether acute organ dysfunction present [facility](HCC)    ED Discharge Orders    None     4:17 PM Patient with history of IV drug use here with left arm pain.  Last IV drug use involving the left arm several days prior.  His forearm is edematous and tender to palpation but compartment soft radial pulse 2+ no evidence of compartment syndrome.  Patient however is ill-appearing.  Will initiate code sepsis and gave appropriate antibiotic.  IV fluid giving at 30 mL/kg.  6:19 PM UA without signs of urinary tract infection, COVID-19 test is negative, normal lactic acid, mild leukocytosis with WC 11.9 and chest x-ray unremarkable.  However patient is ill-appearing, slight murmur heard on lung exam concerning for potential endocarditis in the setting of IV drug use and I am also concerned for bacteremia.  Discussed this with Dr. Lockie Molauratolo we both agree the patient would benefit from admission for IV antibiotic as well as follow-up on blood culture.  6:39 PM Appreciate consultation from tried hospitalist, Dr. Adela Glimpseoutova, who agrees to see and admit patient.  She also requests CT scan of the left forearm to rule out necrotizing fasciitis.  She also requests adding on anaerobic coverage.  Will initiate metronidazole.  Sepsis reassessment done.    Fayrene Helperran, Floyde Dingley, PA-C 11/06/18 1841    Virgina Norfolkuratolo, Adam, DO 11/06/18 [id number]

## 2018-11-06 NOTE — Progress Notes (Signed)
Pharmacy Antibiotic Note  Brendan Preston is a 43 y.o. male admitted on 11/06/2018 with sepsis.  Pharmacy has been consulted for vancomycin and cefepime dosing.  Also consulted to investigate pcn allergy and found pt has tolerated multiple aminopenicillins/cephalosporins in the past.   Tachy, Temp 103, WBC 11.9, SCr 0.66  Plan: Cefepime 2g IV q 8 hours Vancomycin 1500mg  IV x1, then 1250mg  Iv every 12 hours Goal AUC 400-550 Expected AUC: 477 SCr used: 0.8 Monitor renal function, Cx and clinical progression to narrow Vancomycin levels at steady state     Temp (24hrs), Avg:99 F (37.2 C), Min:99 F (37.2 C), Max:99 F (37.2 C)  No results for input(s): WBC, CREATININE, LATICACIDVEN, VANCOTROUGH, VANCOPEAK, VANCORANDOM, GENTTROUGH, GENTPEAK, GENTRANDOM, TOBRATROUGH, TOBRAPEAK, TOBRARND, AMIKACINPEAK, AMIKACINTROU, AMIKACIN in the last 168 hours.  CrCl cannot be calculated (Patient's most recent lab result is older than the maximum 21 days allowed.).    Allergies  Allergen Reactions  . Penicillins     Unknown. Patient reports that he can take augmentin. He is not sure what kind of allergy he has to PCN    Antimicrobials this admission: Vanc 7/31>> Cefepime 7/31>>  Dose adjustments this admission: n/a  Microbiology results: 7/31 BCx: sent 7/31 UCx: sent  Bertis Ruddy, PharmD Clinical Pharmacist Please check AMION for all [place] numbers 11/06/2018 4:22 PM

## 2018-11-06 NOTE — ED Provider Notes (Signed)
Medical screening examination/treatment/procedure(s) were conducted as a shared visit with non-physician practitioner(s) and myself.  I personally evaluated the patient during the encounter. Briefly, the patient is a 43 y.o. male with history of IV drug use who presents the ED with left forearm pain, fever.  Sepsis work-up initiated.  Patient has very mild redness to the left forearm area.  Normal range of motion at the left elbow no concern for septic joint.  This is an injection site for the patient.  Possibly hear murmur as well.  Concern for possible endocarditis.  Will obtain sepsis work-up, give IV antibiotics.  Swab for coronavirus.  Patient with normal lactic acid.  Mild leukocytosis.  Negative for coronavirus.  Chest x-ray unremarkable.  Urinalysis unremarkable.  Overall concerning for cellulitis.  We will get a CT scan to further evaluate arm but low suspicion for necrotizing fasciitis as there is no crepitus.  This has been ongoing for several days.  No obvious signs to suggest abscess.  Concern for possible bacteremia given IV drug use and possible murmur.  Patient to be admitted to medicine service for further care.  This chart was dictated using voice recognition software.  Despite best efforts to proofread,  errors can occur which can change the documentation meaning.     EKG Interpretation  Date/Time:  Friday November 06 2018 17:03:40 EDT Ventricular Rate:  98 PR Interval:    QRS Duration: 89 QT Interval:  335 QTC Calculation: 428 R Axis:   58 Text Interpretation:  Sinus rhythm Borderline short PR interval RSR' in V1 or V2, probably normal variant Minimal ST depression, lateral leads Confirmed by Lennice Sites [phone]) on 11/06/2018 5:30:12 PM           Lennice Sites, DO 11/06/18 1847

## 2018-11-07 ENCOUNTER — Inpatient Hospital Stay (HOSPITAL_COMMUNITY): Payer: Self-pay

## 2018-11-07 DIAGNOSIS — L0390 Cellulitis, unspecified: Secondary | ICD-10-CM

## 2018-11-07 DIAGNOSIS — R509 Fever, unspecified: Secondary | ICD-10-CM

## 2018-11-07 LAB — COMPREHENSIVE METABOLIC PANEL
ALT: 27 U/L (ref 0–44)
AST: 23 U/L (ref 15–41)
Albumin: 3.2 g/dL — ABNORMAL LOW (ref 3.5–5.0)
Alkaline Phosphatase: 72 U/L (ref 38–126)
Anion gap: 9 (ref 5–15)
BUN: 9 mg/dL (ref 6–20)
CO2: 22 mmol/L (ref 22–32)
Calcium: 8.2 mg/dL — ABNORMAL LOW (ref 8.9–10.3)
Chloride: 110 mmol/L (ref 98–111)
Creatinine, Ser: 0.81 mg/dL (ref 0.61–1.24)
GFR calc Af Amer: >60 mL/min (ref >60)
GFR calc non Af Amer: >60 mL/min (ref >60)
Glucose, Bld: 133 mg/dL — ABNORMAL HIGH (ref 70–99)
Potassium: 3.1 mmol/L — ABNORMAL LOW (ref 3.5–5.1)
Sodium: 141 mmol/L (ref 135–145)
Total Bilirubin: 1 mg/dL (ref 0.3–1.2)
Total Protein: 6.2 g/dL — ABNORMAL LOW (ref 6.5–8.1)

## 2018-11-07 LAB — CBC
HCT: 39.7 % (ref 39.0–52.0)
Hemoglobin: 13 g/dL (ref 13.0–17.0)
MCH: 28.4 pg (ref 26.0–34.0)
MCHC: 32.7 g/dL (ref 30.0–36.0)
MCV: 86.9 fL (ref 80.0–100.0)
Platelets: 228 10*3/uL (ref 150–400)
RBC: 4.57 MIL/uL (ref 4.22–5.81)
RDW: 14.1 % (ref 11.5–15.5)
WBC: 14.4 10*3/uL — ABNORMAL HIGH (ref 4.0–10.5)
nRBC: 0 % (ref 0.0–0.2)

## 2018-11-07 LAB — RAPID URINE DRUG SCREEN, HOSP PERFORMED
Amphetamines: NOT DETECTED
Barbiturates: NOT DETECTED
Benzodiazepines: NOT DETECTED
Cocaine: NOT DETECTED
Opiates: POSITIVE — AB
Tetrahydrocannabinol: POSITIVE — AB

## 2018-11-07 LAB — ECHOCARDIOGRAM COMPLETE
Height: 71 in
Weight: 2137.58 oz

## 2018-11-07 LAB — PHOSPHORUS: Phosphorus: 2.9 mg/dL (ref 2.5–4.6)

## 2018-11-07 LAB — HIV ANTIBODY (ROUTINE TESTING W REFLEX): HIV Screen 4th Generation wRfx: NONREACTIVE

## 2018-11-07 LAB — MRSA PCR SCREENING: MRSA by PCR: NEGATIVE

## 2018-11-07 LAB — MAGNESIUM: Magnesium: 2 mg/dL (ref 1.7–2.4)

## 2018-11-07 LAB — TSH: TSH: 0.743 u[IU]/mL (ref 0.350–4.500)

## 2018-11-07 MED ORDER — MORPHINE SULFATE (PF) 2 MG/ML IV SOLN
1.0000 mg | INTRAVENOUS | Status: DC | PRN
  Administered 2018-11-07 (×2): 1 mg via INTRAVENOUS
  Filled 2018-11-07 (×2): qty 1

## 2018-11-07 MED ORDER — MORPHINE SULFATE (PF) 2 MG/ML IV SOLN
2.0000 mg | INTRAVENOUS | Status: DC | PRN
  Administered 2018-11-07 – 2018-11-09 (×12): 2 mg via INTRAVENOUS
  Filled 2018-11-07 (×13): qty 1

## 2018-11-07 MED ORDER — POTASSIUM CHLORIDE CRYS ER 20 MEQ PO TBCR
40.0000 meq | EXTENDED_RELEASE_TABLET | Freq: Once | ORAL | Status: AC
  Administered 2018-11-07: 40 meq via ORAL
  Filled 2018-11-07: qty 2

## 2018-11-07 NOTE — Progress Notes (Signed)
  Echocardiogram 2D Echocardiogram has been performed.  Brendan Preston 11/07/2018, 10:20 AM

## 2018-11-07 NOTE — Progress Notes (Addendum)
PROGRESS NOTE  Brendan Preston WVP:[id number] DOB: May 11, 1975 DOA: 11/06/2018 PCP: Patient, No Pcp Per   LOS: 1 day   Brief narrative: Brendan Preston is a 43 y.o. male with medical history significant of IVD abuse, substance abuse, recurrent cellulitis. He presented to the ED on 7/31 with left arm swelling for last 4-5 days after using that area for drug injection.  Reports chills, fever and fatigue at home. Last use of IV heroin 4 days ago.  In the past he has had cellulitis multiple times from IV injection and at one time he also had abscess under left arm requiring surgical drainage  In the ED, Patient had a temperature of 103, tachycardia up to 105. CT scan of left forearm showed evidence of cellulitis but no abscess, osteomyelitis or foreign body. T-max 103 at 17:04 yesterday, last fever 100.2 at 4:45 AM today.  Subjective: Patient was seen and examined this morning.  [place] Caucasian male.  Lying down in bed.  Not in distress.  Not in withdrawal although he says he has been ' puking and popping' all night long.  Clinically does not look to be in opiate withdrawal.  Assessment/Plan:  Active Problems:   Heroin abuse (HCC)   Cellulitis of left upper arm   Hypokalemia   Sepsis due to cellulitis (HCC)   Sepsis ([place])   Tobacco abuse  Sepsis secondary to cellulitis of left upper arm -   -Cellulitis at the site of IV heroin injection.   -CT scan finding as above, no evidence of abscess, osteomyelitis, foreign body.   -Met sepsis criteria at presentation. -Blood culture sent.  Broad-spectrum antibiotic started.  IV fluids to continue.  Hypokalemia - - will replace and repeat in AM,  check magnesium level and replace as needed  Polysubstance abuse -IV heroin, tobacco. -Spoke about importance of quitting substance abuse. -I started him on morphine 1 mg every 4 hours as needed to prevent opiate withdrawal. -refused nicotine patch  -nursing tobacco cessation protocol  Hypokalemia  -Potassium low at 3.1.  40 mEq oral potassium replacement given.  Mobility: Encourage ambulation Diet: Regular diet DVT prophylaxis:  SCDs Code Status:   Code Status: Full Code  Family Communication:  Not at bedside Expected Discharge:  Likely home in 1 to 2 days.  Consultants:    Procedures:    Antimicrobials:  Anti-infectives (From admission, onward)   Start     Dose/Rate Route Frequency Ordered Stop   11/07/18 0600  vancomycin (VANCOCIN) 1,250 mg in sodium chloride 0.9 % 250 mL IVPB     1,250 mg 166.7 mL/hr over 90 Minutes Intravenous Every 12 hours 11/06/18 1724     11/07/18 0400  metroNIDAZOLE (FLAGYL) IVPB 500 mg     500 mg 100 mL/hr over 60 Minutes Intravenous Every 8 hours 11/06/18 1926     11/06/18 2300  ceFEPIme (MAXIPIME) 2 g in sodium chloride 0.9 % 100 mL IVPB     2 g 200 mL/hr over 30 Minutes Intravenous Every 8 hours 11/06/18 1724     11/06/18 1845  metroNIDAZOLE (FLAGYL) IVPB 500 mg     500 mg 100 mL/hr over 60 Minutes Intravenous  Once 11/06/18 1838 11/06/18 2251   11/06/18 1730  vancomycin (VANCOCIN) 500 mg in sodium chloride 0.9 % 100 mL IVPB     500 mg 100 mL/hr over 60 Minutes Intravenous  Once 11/06/18 1724     11/06/18 1630  ceFEPIme (MAXIPIME) 2 g in sodium chloride 0.9 % 100 mL IVPB  2 g 200 mL/hr over 30 Minutes Intravenous  Once 11/06/18 1615 11/06/18 1720   11/06/18 1615  vancomycin (VANCOCIN) IVPB 1000 mg/200 mL premix     1,000 mg 200 mL/hr over 60 Minutes Intravenous  Once 11/06/18 1612 11/06/18 1833   11/06/18 1615  levofloxacin (LEVAQUIN) IVPB 750 mg  Status:  Discontinued     750 mg 100 mL/hr over 90 Minutes Intravenous  Once 11/06/18 1612 11/06/18 1615      Infusions:  . ceFEPime (MAXIPIME) IV 2 g (11/07/18 0531)  . metronidazole 500 mg (11/07/18 0530)  . vancomycin 1,250 mg (11/07/18 0636)  . vancomycin      Scheduled Meds: . phosphorus  500 mg Oral Daily  . potassium chloride  40 mEq Oral Once    PRN meds:  acetaminophen **OR** acetaminophen, ketorolac, morphine injection, ondansetron **OR** ondansetron (ZOFRAN) IV, traMADol   Objective: Vitals:   11/07/18 0444 11/07/18 0938  BP: 135/84 134/78  Pulse: (!) 59 (!) 54  Resp: 18 18  Temp: 100.2 F (37.9 C) 98.7 F (37.1 C)  SpO2: 97% 100%    Intake/Output Summary (Last 24 hours) at 11/07/2018 1026 Last data filed at 11/07/2018 0400 Gross per 24 hour  Intake 406.35 ml  Output -  Net 406.35 ml   Filed Weights   11/06/18 1648 11/07/18 0444  Weight: 67.1 kg 60.6 kg   Weight change:  Body mass index is 18.63 kg/m.   Physical Exam: General exam: Appears calm and comfortable.  Skin: No rashes, lesions or ulcers. HEENT: Atraumatic, normocephalic, supple neck, no obvious bleeding Lungs: Clear to auscultation bilaterally CVS: Regular rate and rhythm, no murmur GI/Abd soft, nontender, nondistended, bowel sound present CNS: Sleeping, opens eyes and follow command.  Alert, awake, oriented x3 Psychiatry: Extremities: No pedal edema, no calf tenderness.  Left forearm swelling and redness improving.  Data Review: I have personally reviewed the laboratory data and studies available.  Recent Labs  Lab 11/06/18 1611 11/07/18 0556  WBC 11.9* 14.4*  NEUTROABS 10.9*  --   HGB 14.7 13.0  HCT 44.1 39.7  MCV 86.8 86.9  PLT 249 228   Recent Labs  Lab 11/06/18 1611 11/06/18 1925 11/07/18 0556  NA 138  --  141  K 3.4*  --  3.1*  CL 103  --  110  CO2 23  --  22  GLUCOSE 121*  --  133*  BUN 5*  --  9  CREATININE 0.66  --  0.81  CALCIUM 9.1  --  8.2*  MG  --  2.1 2.0  PHOS  --  2.0* 2.9    Terrilee Croak, MD  [facility] 11/07/2018

## 2018-11-07 NOTE — Progress Notes (Addendum)
Notified MD Dahal via secure message that pt continues to experience withdrawal symptoms- pt c/o pan, nausea, vomiting and diarrhea. Morphine and Zofran administered per orders. Pt states he does not feel any better.   60 MD gave order for 2 mg morphine IV every 2 hours.   Paulla Fore

## 2018-11-07 NOTE — Progress Notes (Signed)
New Admission Note:   Arrival Method: stretcher Mental Orientation:AxOx4 Telemetry: # 1 NSR Assessment: Completed Skin:intact left arm red swollen IV: rt hand IV infusing,left hand NSL Pain: none Tubes: none Safety Measures: Safety Fall Prevention Plan has been discussed Admission: Completed [facility] [place] Orientation: Patient has been orientated to the room, unit and staff.  Family:none at side  Orders have been reviewed and implemented. Will continue to monitor the patient. Call light has been placed within reach and bed alarm has been activated.   Rockie Neighbours BSN, RN Phone number: [phone]

## 2018-11-07 NOTE — Progress Notes (Signed)
CSW met with the patient at bedside. The patient reported that he uses heroin but feels that he does not have a drug problem. CSW offered the patient resources. The patient accepted, he did not have any questions or concerns.   CSW thanked him for his time. CSW is signing off for now. Please re-consult if needed again.   Domenic Schwab, MSW, [place] Worker [facility]  [phone]

## 2018-11-08 LAB — URINE CULTURE: Culture: 60000 — AB

## 2018-11-08 LAB — BLOOD CULTURE ID PANEL (REFLEXED)

## 2018-11-08 MED ORDER — SODIUM CHLORIDE 0.9 % IV SOLN
2.0000 g | Freq: Every day | INTRAVENOUS | Status: DC
  Administered 2018-11-08 – 2018-11-10 (×3): 2 g via INTRAVENOUS
  Filled 2018-11-08 (×2): qty 2
  Filled 2018-11-08: qty 20

## 2018-11-08 NOTE — Progress Notes (Signed)
PROGRESS NOTE  Brendan Preston SNK:[id number] DOB: 1975/11/21 DOA: 11/06/2018 PCP: Patient, No Pcp Per   LOS: 2 days   Brief narrative: Brendan Preston a 43 y.o.malewith medical history significant of IVD abuse,substance abuse, recurrent cellulitis. He presented to the ED on 7/31 with left arm swelling for last 4-5 days after using that area for drug injection.  Reports chills, fever and fatigue at home. Last use of IV heroin 4 days ago.  In the past he has had cellulitis multiple times from IV injection and at one time he also had abscess under left arm requiring surgical drainage  In the ED, patient had a temperature of 103, tachycardia up to 105. CT scan of left forearm showed evidence of cellulitis but no abscess, osteomyelitis or foreign body.  Patient was admitted for cellulitis of left forearm. Blood culture preliminary report is showing growth of Serratia Marcescens and Enterobacteriaceae species. Urine culture preliminary report is showing 60,000 CFU per mL of gram-positive cocci.  Subjective: Patient was seen and examined this morning.  Not in distress.  Complains of multiple episodes of diarrhea in last 24 hours.  Unable to confirm from the nurses.  Patient states that he is in withdrawal which clinically does not seem to be.  Currently on IV morphine 2 mg every 2 hours as needed.  Assessment/Plan:  Active Problems:   Heroin abuse (HCC)   Cellulitis of left upper arm   Hypokalemia   Sepsis due to cellulitis (HCC)   Sepsis ([place])   Tobacco abuse  Sepsis secondary to cellulitis of left upper arm Gram-negative bacteremia -Cellulitis at the site of IV heroin injection.   -CT scan finding as above, no evidence of abscess, osteomyelitis, foreign body.   -Met sepsis criteria at presentation. -Blood culture was sent.  Broad-spectrum antibiotic started.  IV fluids to continue. -Blood culture preliminary report is showing growth of Serratia Marcescens and Enterobacteriaceae  species. -Antibiotic adjusted to IV Rocephin 2 g daily. -Waiting for final culture report.  Urine colonization -No symptoms of UTI. -Urine culture preliminary report is showing 60,000 CFU per mL of gram-positive cocci. -Pending final culture report.  Hypokalemia - -potassium level low at 3.1 yesterday.  Replaced.  Recheck tomorrow.  Polysubstance abuse -IV heroin, tobacco. -Spoke about importance of quitting substance abuse. -I started him on morphine 2 mg every 2 hours as needed to prevent opiate withdrawal. -Start tapering down. -refusednicotine patch  -nursing tobacco cessation protocol  Diarrhea -Complains of multiple episodes of diarrhea last 24 hours.  Unable to confirm from the nurses. -I am not sure if he is really having diarrhea or malingering withdrawal symptoms to get IV narcotics. -I asked him to let the nurse know next time he has bowel movement to collect him sample.  Mobility: Encourage ambulation Diet: Regular diet DVT prophylaxis: SCDs Code Status:  Code Status: Full Code  Family Communication: Not at bedside Expected Discharge: Likely home in 1 to 2 days.  Consultants:  None  Procedures:  None   Antimicrobials:  Anti-infectives (From admission, onward)   Start     Dose/Rate Route Frequency Ordered Stop   11/08/18 0600  cefTRIAXone (ROCEPHIN) 2 g in sodium chloride 0.9 % 100 mL IVPB     2 g 200 mL/hr over 30 Minutes Intravenous Daily 11/08/18 0207     11/07/18 0600  vancomycin (VANCOCIN) 1,250 mg in sodium chloride 0.9 % 250 mL IVPB  Status:  Discontinued     1,250 mg 166.7 mL/hr over 90 Minutes Intravenous Every  12 hours 11/06/18 1724 11/08/18 0207   11/07/18 0400  metroNIDAZOLE (FLAGYL) IVPB 500 mg  Status:  Discontinued     500 mg 100 mL/hr over 60 Minutes Intravenous Every 8 hours 11/06/18 1926 11/08/18 0207   11/06/18 2300  ceFEPIme (MAXIPIME) 2 g in sodium chloride 0.9 % 100 mL IVPB  Status:  Discontinued     2 g 200 mL/hr over  30 Minutes Intravenous Every 8 hours 11/06/18 1724 11/08/18 0207   11/06/18 1845  metroNIDAZOLE (FLAGYL) IVPB 500 mg     500 mg 100 mL/hr over 60 Minutes Intravenous  Once 11/06/18 1838 11/06/18 2251   11/06/18 1730  vancomycin (VANCOCIN) 500 mg in sodium chloride 0.9 % 100 mL IVPB  Status:  Discontinued     500 mg 100 mL/hr over 60 Minutes Intravenous  Once 11/06/18 1724 11/08/18 1022   11/06/18 1630  ceFEPIme (MAXIPIME) 2 g in sodium chloride 0.9 % 100 mL IVPB     2 g 200 mL/hr over 30 Minutes Intravenous  Once 11/06/18 1615 11/06/18 1720   11/06/18 1615  vancomycin (VANCOCIN) IVPB 1000 mg/200 mL premix     1,000 mg 200 mL/hr over 60 Minutes Intravenous  Once 11/06/18 1612 11/06/18 1833   11/06/18 1615  levofloxacin (LEVAQUIN) IVPB 750 mg  Status:  Discontinued     750 mg 100 mL/hr over 90 Minutes Intravenous  Once 11/06/18 1612 11/06/18 1615      Infusions:  . cefTRIAXone (ROCEPHIN)  IV 2 g (11/08/18 0702)    Scheduled Meds: . phosphorus  500 mg Oral Daily    PRN meds: acetaminophen **OR** acetaminophen, ketorolac, morphine injection, ondansetron **OR** ondansetron (ZOFRAN) IV, traMADol   Objective: Vitals:   11/07/18 2106 11/08/18 0923  BP: (!) 151/93 123/85  Pulse: (!) 59 (!) 57  Resp: 16 18  Temp: 99.4 F (37.4 C) 98.5 F (36.9 C)  SpO2: 98% 98%    Intake/Output Summary (Last 24 hours) at 11/08/2018 1028 Last data filed at 11/08/2018 0900 Gross per 24 hour  Intake 1020.04 ml  Output 0 ml  Net 1020.04 ml   Filed Weights   11/06/18 1648 11/07/18 0444  Weight: 67.1 kg 60.6 kg   Weight change:  Body mass index is 18.63 kg/m.   Physical Exam: General exam: Appears calm and comfortable.  Skin: No rashes, lesions or ulcers. HEENT: Atraumatic, normocephalic, supple neck, no obvious bleeding Lungs: Clear to auscultation bilaterally CVS: Regular rate and rhythm, no murmur GI/Abd soft, nontender, nondistended, bowel sound present CNS: Alert, awake, oriented x3  Psychiatry: Mood appropriate Extremities: Cellulitis in left arm improving.  No pedal edema, no calf tenderness  Data Review: I have personally reviewed the laboratory data and studies available.  Recent Labs  Lab 11/06/18 1611 11/07/18 0556  WBC 11.9* 14.4*  NEUTROABS 10.9*  --   HGB 14.7 13.0  HCT 44.1 39.7  MCV 86.8 86.9  PLT 249 228   Recent Labs  Lab 11/06/18 1611 11/06/18 1925 11/07/18 0556  NA 138  --  141  K 3.4*  --  3.1*  CL 103  --  110  CO2 23  --  22  GLUCOSE 121*  --  133*  BUN 5*  --  9  CREATININE 0.66  --  0.81  CALCIUM 9.1  --  8.2*  MG  --  2.1 2.0  PHOS  --  2.0* 2.9    Terrilee Croak, MD  [facility] 11/08/2018

## 2018-11-08 NOTE — Progress Notes (Signed)
PHARMACY - PHYSICIAN COMMUNICATION CRITICAL VALUE ALERT - BLOOD CULTURE IDENTIFICATION (BCID)  Brendan Preston is an 43 y.o. male with h/o IVDU who presented to [facility] on 11/06/2018 with a chief complaint of L arm cellulitis/sepsis  Assessment:  Blood culture growing Serratia marcescens  Name of physician (or Provider) Contacted: C Bodenheimer  Current antibiotics:   Vancomycin and Cefepime and Flagyl  Changes to prescribed antibiotics recommended:   Will narrow therapy to Rocephin 2 g IV q24h   Results for orders placed or performed during the hospital encounter of 11/06/18  Blood Culture ID Panel (Reflexed) (Collected: 11/06/2018  4:20 PM)  Result Value Ref Range   Enterococcus species NOT DETECTED NOT DETECTED   Listeria monocytogenes NOT DETECTED NOT DETECTED   Staphylococcus species NOT DETECTED NOT DETECTED   Staphylococcus aureus (BCID) NOT DETECTED NOT DETECTED   Streptococcus species NOT DETECTED NOT DETECTED   Streptococcus agalactiae NOT DETECTED NOT DETECTED   Streptococcus pneumoniae NOT DETECTED NOT DETECTED   Streptococcus pyogenes NOT DETECTED NOT DETECTED   Acinetobacter baumannii NOT DETECTED NOT DETECTED   Enterobacteriaceae species DETECTED (A) NOT DETECTED   Enterobacter cloacae complex NOT DETECTED NOT DETECTED   Escherichia coli NOT DETECTED NOT DETECTED   Klebsiella oxytoca NOT DETECTED NOT DETECTED   Klebsiella pneumoniae NOT DETECTED NOT DETECTED   Proteus species NOT DETECTED NOT DETECTED   Serratia marcescens DETECTED (A) NOT DETECTED   Carbapenem resistance NOT DETECTED NOT DETECTED   Haemophilus influenzae NOT DETECTED NOT DETECTED   Neisseria meningitidis NOT DETECTED NOT DETECTED   Pseudomonas aeruginosa NOT DETECTED NOT DETECTED   Candida albicans NOT DETECTED NOT DETECTED   Candida glabrata NOT DETECTED NOT DETECTED   Candida krusei NOT DETECTED NOT DETECTED   Candida parapsilosis NOT DETECTED NOT DETECTED   Candida tropicalis NOT  DETECTED NOT DETECTED    [name] 11/08/2018  2:04 AM

## 2018-11-08 NOTE — Progress Notes (Signed)
Upon rounding, room smelled like smoke. Pt admitted to smoking cigarettes, pt was asked to turn the cigarettes and lighter over and pt complied. Explained to pt that if he needs to smoke, MD can prescribe a nicotine patch and that he should not be smoking in the hospital for safety reasons. Pt verbalized understanding.   MD Dahal was notified regarding pt needing a nicotine patch.  Paulla Fore, RN

## 2018-11-09 LAB — CBC WITH DIFFERENTIAL/PLATELET
Abs Immature Granulocytes: 0.02 10*3/uL (ref 0.00–0.07)
Basophils Absolute: 0 10*3/uL (ref 0.0–0.1)
Basophils Relative: 0 %
Eosinophils Absolute: 0 10*3/uL (ref 0.0–0.5)
Eosinophils Relative: 0 %
HCT: 41.8 % (ref 39.0–52.0)
Hemoglobin: 14 g/dL (ref 13.0–17.0)
Immature Granulocytes: 0 %
Lymphocytes Relative: 21 %
Lymphs Abs: 1.7 10*3/uL (ref 0.7–4.0)
MCH: 28.5 pg (ref 26.0–34.0)
MCHC: 33.5 g/dL (ref 30.0–36.0)
MCV: 85 fL (ref 80.0–100.0)
Monocytes Absolute: 0.7 10*3/uL (ref 0.1–1.0)
Monocytes Relative: 9 %
Neutro Abs: 5.6 10*3/uL (ref 1.7–7.7)
Neutrophils Relative %: 70 %
Platelets: 229 10*3/uL (ref 150–400)
RBC: 4.92 MIL/uL (ref 4.22–5.81)
RDW: 13.7 % (ref 11.5–15.5)
WBC: 8 10*3/uL (ref 4.0–10.5)
nRBC: 0 % (ref 0.0–0.2)

## 2018-11-09 LAB — BASIC METABOLIC PANEL
Anion gap: 12 (ref 5–15)
BUN: 7 mg/dL (ref 6–20)
CO2: 26 mmol/L (ref 22–32)
Calcium: 8.6 mg/dL — ABNORMAL LOW (ref 8.9–10.3)
Chloride: 101 mmol/L (ref 98–111)
Creatinine, Ser: 0.76 mg/dL (ref 0.61–1.24)
GFR calc Af Amer: >60 mL/min (ref >60)
GFR calc non Af Amer: >60 mL/min (ref >60)
Glucose, Bld: 119 mg/dL — ABNORMAL HIGH (ref 70–99)
Potassium: 3.4 mmol/L — ABNORMAL LOW (ref 3.5–5.1)
Sodium: 139 mmol/L (ref 135–145)

## 2018-11-09 MED ORDER — NICOTINE 21 MG/24HR TD PT24
21.0000 mg | MEDICATED_PATCH | Freq: Every day | TRANSDERMAL | Status: DC
  Administered 2018-11-09 – 2018-11-10 (×2): 21 mg via TRANSDERMAL
  Filled 2018-11-09 (×2): qty 1

## 2018-11-09 MED ORDER — MORPHINE SULFATE (PF) 2 MG/ML IV SOLN
1.0000 mg | INTRAVENOUS | Status: DC | PRN
  Administered 2018-11-09 – 2018-11-10 (×12): 1 mg via INTRAVENOUS
  Filled 2018-11-09 (×12): qty 1

## 2018-11-09 NOTE — Progress Notes (Signed)
PROGRESS NOTE  Brendan Preston LPF:[id number] DOB: 1975/08/31 DOA: 11/06/2018 PCP: Patient, No Pcp Per   LOS: 3 days   Brief narrative: Brendan Preston a 43 y.o.malewith medical history significant of IVD abuse,substance abuse,recurrent cellulitis. He presentedto the ED on 7/31 withleft arm swelling forlast 4-5days after using that area for drug injection. Reports chills, fever and fatigue at home. Last use of IV heroin 4 days ago. In the past he has had cellulitis multiple times from IV injection and at one time he also had abscess under left arm requiring surgical drainage  In the ED, patient had a temperature of 103, tachycardia up to 105. CT scan of left forearm showed evidence of cellulitis but no abscess, osteomyelitis or foreign body.  Patient was admitted for cellulitis of left forearm. Blood culture preliminary report is showing growth of Serratia Marcescens and Enterobacteriaceae species. Urine culture preliminary report is showing 60,000 CFU per mL of gram-positive cocci.  Subjective: Patient was seen and examined morning.  Lying in bed.  Not in distress.  Cellulitis improving  Assessment/Plan:  Active Problems:   Heroin abuse (HCC)   Cellulitis of left upper arm   Hypokalemia   Sepsis due to cellulitis (HCC)   Sepsis ([place])   Tobacco abuse  Sepsis secondary to cellulitis of left upper arm Gram-negative bacteremia -Cellulitis at the site of IV heroin injection. -CT scan finding as above, no evidence of abscess, osteomyelitis, foreign body. -Met sepsis criteria at presentation. -Blood culture preliminary report is showing growth of Serratia Marcescens and Enterobacteriaceae species.  -Antibiotic adjusted to IV Rocephin 2 g daily. -Waiting for final culture report.  -Adequately hydrated with IV hydration.  Urine colonization -Urine culture preliminary report is showing 60,000 CFU per mL of Aerococcus. -Unclear if it is true infection.  No  symptoms.  Hypokalemia - -potassium level improved at 3.4 today.   Polysubstance abuse-IVheroin, tobacco. -Spoke about importance of quittingsubstance abuse. -Problem IV morphine 2 mg every 2 hours as needed to prevent opiate withdrawal.  Reduce dose to 1 mg every 2 hours.  Been tapering down. -Orderednicotine patch  -nursing tobacco cessation protocol  Diarrhea -Improved.  No bowel movement overnight.  Had a soft stool yesterday.    Mobility:Encourage ambulation Diet:Regular diet DVT prophylaxis:SCDs Code Status:Code Status: Full Code Family Communication:Not at bedside Expected Discharge:Likely home in 1 to 2 days.  Consultants:  None  Procedures:  None  Antimicrobials:  Anti-infectives (From admission, onward)   Start     Dose/Rate Route Frequency Ordered Stop   11/08/18 0600  cefTRIAXone (ROCEPHIN) 2 g in sodium chloride 0.9 % 100 mL IVPB     2 g 200 mL/hr over 30 Minutes Intravenous Daily 11/08/18 0207     11/07/18 0600  vancomycin (VANCOCIN) 1,250 mg in sodium chloride 0.9 % 250 mL IVPB  Status:  Discontinued     1,250 mg 166.7 mL/hr over 90 Minutes Intravenous Every 12 hours 11/06/18 1724 11/08/18 0207   11/07/18 0400  metroNIDAZOLE (FLAGYL) IVPB 500 mg  Status:  Discontinued     500 mg 100 mL/hr over 60 Minutes Intravenous Every 8 hours 11/06/18 1926 11/08/18 0207   11/06/18 2300  ceFEPIme (MAXIPIME) 2 g in sodium chloride 0.9 % 100 mL IVPB  Status:  Discontinued     2 g 200 mL/hr over 30 Minutes Intravenous Every 8 hours 11/06/18 1724 11/08/18 0207   11/06/18 1845  metroNIDAZOLE (FLAGYL) IVPB 500 mg     500 mg 100 mL/hr over 60 Minutes Intravenous  Once 11/06/18 1838 11/06/18 2251   11/06/18 1730  vancomycin (VANCOCIN) 500 mg in sodium chloride 0.9 % 100 mL IVPB  Status:  Discontinued     500 mg 100 mL/hr over 60 Minutes Intravenous  Once 11/06/18 1724 11/08/18 1022   11/06/18 1630  ceFEPIme (MAXIPIME) 2 g in sodium chloride 0.9 % 100  mL IVPB     2 g 200 mL/hr over 30 Minutes Intravenous  Once 11/06/18 1615 11/06/18 1720   11/06/18 1615  vancomycin (VANCOCIN) IVPB 1000 mg/200 mL premix     1,000 mg 200 mL/hr over 60 Minutes Intravenous  Once 11/06/18 1612 11/06/18 1833   11/06/18 1615  levofloxacin (LEVAQUIN) IVPB 750 mg  Status:  Discontinued     750 mg 100 mL/hr over 90 Minutes Intravenous  Once 11/06/18 1612 11/06/18 1615      Infusions:  . cefTRIAXone (ROCEPHIN)  IV 2 g (11/09/18 0953)    Scheduled Meds: . phosphorus  500 mg Oral Daily    PRN meds: acetaminophen **OR** acetaminophen, ketorolac, morphine injection, ondansetron **OR** ondansetron (ZOFRAN) IV, traMADol   Objective: Vitals:   11/09/18 0202 11/09/18 0920  BP: (!) 163/82 109/75  Pulse: (!) 40 65  Resp:  16  Temp:  98.6 F (37 C)  SpO2: 100% 96%    Intake/Output Summary (Last 24 hours) at 11/09/2018 1157 Last data filed at 11/09/2018 1000 Gross per 24 hour  Intake 1860 ml  Output 0 ml  Net 1860 ml   Filed Weights   11/06/18 1648 11/07/18 0444  Weight: 67.1 kg 60.6 kg   Weight change:  Body mass index is 18.63 kg/m.   Physical Exam: General exam: Appears calm and comfortable.  Skin: No rashes, lesions or ulcers. HEENT: Atraumatic, normocephalic, supple neck, no obvious bleeding Lungs: Clear to auscultation bilaterally CVS: Regular rate and rhythm GI/Abd soft, nontender, nondistended, bowel sounds present CNS: Alert, awake, oriented x3 Psychiatry: Mood appropriate Extremities: No pedal edema, no calf tenderness.  Left upper extremity cellulitis improving.  Data Review: I have personally reviewed the laboratory data and studies available.  Recent Labs  Lab 11/06/18 1611 11/07/18 0556 11/09/18 0542  WBC 11.9* 14.4* 8.0  NEUTROABS 10.9*  --  5.6  HGB 14.7 13.0 14.0  HCT 44.1 39.7 41.8  MCV 86.8 86.9 85.0  PLT 249 228 229   Recent Labs  Lab 11/06/18 1611 11/06/18 1925 11/07/18 0556 11/09/18 0542  NA 138  --  141  139  K 3.4*  --  3.1* 3.4*  CL 103  --  110 101  CO2 23  --  22 26  GLUCOSE 121*  --  133* 119*  BUN 5*  --  9 7  CREATININE 0.66  --  0.81 0.76  CALCIUM 9.1  --  8.2* 8.6*  MG  --  2.1 2.0  --   PHOS  --  2.0* 2.9  --     Terrilee Croak, MD  [facility] 11/09/2018

## 2018-11-10 LAB — CULTURE, BLOOD (ROUTINE X 2): Special Requests: ADEQUATE

## 2018-11-10 MED ORDER — SULFAMETHOXAZOLE-TRIMETHOPRIM 800-160 MG PO TABS: 1.0000 | ORAL_TABLET | Freq: Two times a day (BID) | ORAL | Status: DC

## 2018-11-10 MED ORDER — SULFAMETHOXAZOLE-TRIMETHOPRIM 800-160 MG PO TABS: 1.0000 | ORAL_TABLET | Freq: Two times a day (BID) | ORAL | 0 refills | Status: AC

## 2018-11-10 NOTE — Progress Notes (Signed)
Christy Gentles to be discharged Home per MD order. Discussed prescriptions and follow up appointments with the patient. Prescription explained to patient; medication list explained in detail. Patient verbalized understanding.  Skin clean, dry and intact without evidence of skin break down, no evidence of skin tears noted. IV catheter discontinued intact. Site without signs and symptoms of complications. Dressing and pressure applied. Pt denies pain at the site currently. No complaints noted.   Gave his cigarettes back before leaving the floor.  Patient free of lines, drains, and wounds.   An After Visit Summary (AVS) was printed and given to the patient. Patient escorted via wheelchair, and discharged home via private auto.  Amaryllis Dyke, RN

## 2018-11-10 NOTE — Discharge Summary (Signed)
Physician Discharge Summary  Greycen Felter NHA:[id number] DOB: Oct 10, 1975 DOA: 11/06/2018  PCP: Patient, No Pcp Per  Admit date: 11/06/2018 Discharge date: 11/10/2018  Admitted From: Home Discharge disposition: Home   Code Status: Full Code   Recommendations for Outpatient Follow-Up:   1. Continue antibiotic for next 10 days complete 2 weeks course. 2. Follow-up with PCP as an outpatient.  Discharge Diagnosis:   Active Problems:   Heroin abuse ([place])   Cellulitis of left upper arm   Hypokalemia   Sepsis due to cellulitis (HCC)   Sepsis ([place])   Tobacco abuse  History of Present Illness / Brief narrative:  Rastus Borton a 43 y.o.malewith medical history significant of IVD abuse,substance abuse,recurrent cellulitis. He presentedto the ED on 7/31 withleft arm swelling forlast 4-5days after using that area for drug injection. Reports chills, fever and fatigue at home. Last use of IV heroin 4 days ago. In the past he has had cellulitis multiple times from IV injection and at one time he also had abscess under left arm requiring surgical drainage  In the ED,patient had a temperature of 103, tachycardia up to 105. CT scan of left forearm showed evidence of cellulitis but no abscess, osteomyelitis or foreign body.  Patient was admitted for cellulitis of left forearm. Blood culture preliminary report is showing growth of Serratia Marcescensand Enterobacteriaceaespecies.  Subjective:  Patient was seen and examined this morning.  Pleasant young Caucasian male.  Not in distress.  Hospital Course:  Sepsis secondary to cellulitis of left upper arm Gram-negative bacteremia -Cellulitis at the site of IV heroin injection. -CT scan finding as above, no evidence of abscess, osteomyelitis, foreign body. -Met sepsis criteria at presentation. -Blood culture preliminary report is showing growth of Serratia Marcescensand Enterobacteriaceaespecies.  -Currently on IV  Rocephin 2 g daily.  Repeat blood culture negative.  -Cellulitis has significantly improved on the left upper extremity. -Based on sensitivity pattern, plan is to discharge to home on oral Bactrim for next 10 days.  Urine colonization -Urine culture preliminary report is showing 60,000 CFU per mL of Aerococcus. -No urinary symptoms.  Polysubstance abuse-IVheroin, tobacco. -Counseled extensively about importance of quittingsubstance abuse. -In the hospital, he was given IV morphine and a tapering course to avoid withdrawal.  Diarrhea -Initially complained of diarrhea, likely because of opiate withdrawal.  Initially slowed down.  Soft stool now.  Stable for discharge to home today.  Discharge Exam:   Vitals:   11/09/18 1704 11/09/18 2030 11/10/18 0525 11/10/18 1004  BP: 128/87 120/76 120/85 109/81  Pulse: (!) 57 69 (!) 51 66  Resp: '18 18 18 18  ' Temp: 98.8 F (37.1 C) 99 F (37.2 C) 98 F (36.7 C) 97.9 F (36.6 C)  TempSrc: Oral Oral Oral Oral  SpO2: 100% 98% 100% 96%  Weight:      Height:        Body mass index is 18.63 kg/m.  General exam: Appears calm and comfortable.  Skin: No rashes, lesions or ulcers. HEENT: Atraumatic, normocephalic, supple neck, no obvious bleeding Lungs: Clear to auscultation bilaterally CVS: Regular rate and rhythm, no murmur GI/Abd soft, nontender, nondistended, bowel sound present CNS: Alert, awake, oriented x3 Psychiatry: Mood appropriate Extremities: No edema, no calf tenderness, cellulitis improved on the left upper extremity.  Discharge Instructions:  Wound care: None Discharge Instructions    Call MD for:  extreme fatigue   Complete by: As directed    Call MD for:  persistant dizziness or light-headedness   Complete by: As  directed    Call MD for:  severe uncontrolled pain   Complete by: As directed    Call MD for:  temperature >100.4   Complete by: As directed    Diet - low sodium heart healthy   Complete by: As  directed    Increase activity slowly   Complete by: As directed       Allergies as of 11/10/2018      Reactions   Penicillins Other (See Comments)   Reaction not recalled by the patient Did it involve swelling of the face/tongue/throat, SOB, or low BP? Unk Did it involve sudden or severe rash/hives, skin peeling, or any reaction on the inside of your mouth or nose? Unk Did you need to seek medical attention at a hospital or doctor's office? Unk When did it last happen? Unk If all above answers are NO, may proceed with cephalosporin use.      Medication List    STOP taking these medications   amoxicillin-clavulanate 875-125 MG tablet Commonly known as: Augmentin   benzonatate 100 MG capsule Commonly known as: TESSALON   doxycycline 100 MG capsule Commonly known as: VIBRAMYCIN   HYDROcodone-acetaminophen 7.5-325 MG tablet Commonly known as: NORCO   naloxone 4 MG/0.1ML Liqd nasal spray kit Commonly known as: NARCAN     TAKE these medications   sulfamethoxazole-trimethoprim 800-160 MG tablet Commonly known as: BACTRIM DS Take 1 tablet by mouth 2 (two) times daily for 10 days.       Time coordinating discharge: 35 minutes  The results of significant diagnostics from this hospitalization (including imaging, microbiology, ancillary and laboratory) are listed below for reference.    Procedures and Diagnostic Studies:   Ct Forearm Left W Contrast  Result Date: 11/06/2018 CLINICAL DATA:  Soft tissue swelling. Cellulitis. Skin infection. Osteomyelitis suspected. EXAM: CT OF THE UPPER LEFT EXTREMITY WITH CONTRAST TECHNIQUE: Multidetector CT imaging of the upper left extremity was performed according to the standard protocol following intravenous contrast administration. COMPARISON:  None. CONTRAST:  18m OMNIPAQUE IOHEXOL 300 MG/ML  SOLN FINDINGS: Bones/Joint/Cartilage There is no displaced fracture. There is no CT evidence for osteomyelitis. Ligaments Suboptimally assessed  by CT. Muscles and Tendons Unremarkable. Soft tissues There is soft tissue swelling about the forearm. There is no subcutaneous gas. There is no radiopaque foreign body. There is no significant fluid collection that would be concerning for an abscess. Evaluation was limited by significant motion artifact resulting in a suboptimal contrast bolus timing on repeat imaging. IMPRESSION: 1. Exam was limited by motion artifact resulting in suboptimal contrast timing. 2. Soft tissue swelling about the forearm which can be seen in patients with cellulitis. There is no drainable fluid collection. There is no subcutaneous gas. 3. No displaced fracture. No CT evidence for osteomyelitis. No radiopaque foreign body. Electronically Signed   By: CConstance HolsterM.D.   On: 11/06/2018 20:11   Dg Chest Port 1 View  Result Date: 11/06/2018 CLINICAL DATA:  Sepsis. EXAM: PORTABLE CHEST 1 VIEW COMPARISON:  PA and lateral chest 02/27/2012. Single-view of the chest 10/14/2014. FINDINGS: Punctate calcified granulomata are seen. No consolidative process, pneumothorax or effusion. Heart size is normal. IMPRESSION: No acute disease. Electronically Signed   By: TInge RiseM.D.   On: 11/06/2018 16:31     Labs:   Basic Metabolic Panel: Recent Labs  Lab 11/06/18 1611 11/06/18 1925 11/07/18 0556 11/09/18 0542  NA 138  --  141 139  K 3.4*  --  3.1* 3.4*  CL 103  --  110 101  CO2 23  --  22 26  GLUCOSE 121*  --  133* 119*  BUN 5*  --  9 7  CREATININE 0.66  --  0.81 0.76  CALCIUM 9.1  --  8.2* 8.6*  MG  --  2.1 2.0  --   PHOS  --  2.0* 2.9  --    GFR Estimated Creatinine Clearance: 101 mL/min (by C-G formula based on SCr of 0.76 mg/dL). Liver Function Tests: Recent Labs  Lab 11/06/18 1611 11/07/18 0556  AST 24 23  ALT 29 27  ALKPHOS 84 72  BILITOT 0.9 1.0  PROT 7.3 6.2*  ALBUMIN 3.9 3.2*   No results for input(s): LIPASE, AMYLASE in the last 168 hours. No results for input(s): AMMONIA in the last 168  hours. Coagulation profile Recent Labs  Lab 11/06/18 1611  INR 1.1    CBC: Recent Labs  Lab 11/06/18 1611 11/07/18 0556 11/09/18 0542  WBC 11.9* 14.4* 8.0  NEUTROABS 10.9*  --  5.6  HGB 14.7 13.0 14.0  HCT 44.1 39.7 41.8  MCV 86.8 86.9 85.0  PLT 249 228 229   Cardiac Enzymes: No results for input(s): CKTOTAL, CKMB, CKMBINDEX, TROPONINI in the last 168 hours. BNP: Invalid input(s): POCBNP CBG: No results for input(s): GLUCAP in the last 168 hours. D-Dimer No results for input(s): DDIMER in the last 72 hours. Hgb A1c No results for input(s): HGBA1C in the last 72 hours. Lipid Profile No results for input(s): CHOL, HDL, LDLCALC, TRIG, CHOLHDL, LDLDIRECT in the last 72 hours. Thyroid function studies No results for input(s): TSH, T4TOTAL, T3FREE, THYROIDAB in the last 72 hours.  Invalid input(s): FREET3 Anemia work up No results for input(s): VITAMINB12, FOLATE, FERRITIN, TIBC, IRON, RETICCTPCT in the last 72 hours. Microbiology Recent Results (from the past 240 hour(s))  Blood Culture (routine x 2)     Status: Abnormal   Collection Time: 11/06/18  4:20 PM   Specimen: BLOOD RIGHT ARM  Result Value Ref Range Status   Specimen Description BLOOD RIGHT ARM  Final   Special Requests   Final    BOTTLES DRAWN AEROBIC AND ANAEROBIC Blood Culture adequate volume   Culture  Setup Time   Final    ANAEROBIC BOTTLE ONLY GRAM NEGATIVE RODS CRITICAL RESULT CALLED TO, READ BACK BY AND VERIFIED WITH: G ABBOTT PHARMD 11/08/18 0201 JDW Performed at [facility], [place] [address], [place], [place] [address]    Culture SERRATIA MARCESCENS (A)  Final   Report Status 11/10/2018 FINAL  Final   Organism ID, Bacteria SERRATIA MARCESCENS  Final      Susceptibility   Serratia marcescens - MIC*    CEFAZOLIN >=64 RESISTANT Resistant     CEFEPIME <=1 SENSITIVE Sensitive     CEFTAZIDIME <=1 SENSITIVE Sensitive     CEFTRIAXONE <=1 SENSITIVE Sensitive     CIPROFLOXACIN <=0.25 SENSITIVE  Sensitive     GENTAMICIN <=1 SENSITIVE Sensitive     TRIMETH/SULFA <=20 SENSITIVE Sensitive     * SERRATIA MARCESCENS  Blood Culture ID Panel (Reflexed)     Status: Abnormal   Collection Time: 11/06/18  4:20 PM  Result Value Ref Range Status   Enterococcus species NOT DETECTED NOT DETECTED Final   Listeria monocytogenes NOT DETECTED NOT DETECTED Final   Staphylococcus species NOT DETECTED NOT DETECTED Final   Staphylococcus aureus (BCID) NOT DETECTED NOT DETECTED Final   Streptococcus species NOT DETECTED NOT DETECTED Final   Streptococcus agalactiae NOT DETECTED NOT  DETECTED Final   Streptococcus pneumoniae NOT DETECTED NOT DETECTED Final   Streptococcus pyogenes NOT DETECTED NOT DETECTED Final   Acinetobacter baumannii NOT DETECTED NOT DETECTED Final   Enterobacteriaceae species DETECTED (A) NOT DETECTED Final    Comment: Enterobacteriaceae represent a large family of gram-negative bacteria, not a single organism. CRITICAL RESULT CALLED TO, READ BACK BY AND VERIFIED WITH: G ABBOTT PHARMD 11/08/18 0201 JDW    Enterobacter cloacae complex NOT DETECTED NOT DETECTED Final   Escherichia coli NOT DETECTED NOT DETECTED Final   Klebsiella oxytoca NOT DETECTED NOT DETECTED Final   Klebsiella pneumoniae NOT DETECTED NOT DETECTED Final   Proteus species NOT DETECTED NOT DETECTED Final   Serratia marcescens DETECTED (A) NOT DETECTED Final    Comment: CRITICAL RESULT CALLED TO, READ BACK BY AND VERIFIED WITH: G ABBOTT PHARMD 11/08/18 0201 JDW    Carbapenem resistance NOT DETECTED NOT DETECTED Final   Haemophilus influenzae NOT DETECTED NOT DETECTED Final   Neisseria meningitidis NOT DETECTED NOT DETECTED Final   Pseudomonas aeruginosa NOT DETECTED NOT DETECTED Final   Candida albicans NOT DETECTED NOT DETECTED Final   Candida glabrata NOT DETECTED NOT DETECTED Final   Candida krusei NOT DETECTED NOT DETECTED Final   Candida parapsilosis NOT DETECTED NOT DETECTED Final   Candida tropicalis NOT  DETECTED NOT DETECTED Final    Comment: Performed at [facility], [place] [address], [place], [place] [address]  Urine culture     Status: Abnormal   Collection Time: 11/06/18  4:27 PM   Specimen: In/Out Cath Urine  Result Value Ref Range Status   Specimen Description IN/OUT CATH URINE  Final   Special Requests NONE  Final   Culture (A)  Final    60,000 COLONIES/mL AEROCOCCUS URINAE Standardized susceptibility testing for this organism is not available. Performed at [facility], [place] [address], [place],  [address]    Report Status 11/08/2018 FINAL  Final  SARS Coronavirus 2 [facility] order, Performed in [place] hospital lab)     Status: None   Collection Time: 11/06/18  4:27 PM  Result Value Ref Range Status   SARS Coronavirus 2 NEGATIVE NEGATIVE Final    Comment: (NOTE) If result is NEGATIVE SARS-CoV-2 target nucleic acids are NOT DETECTED. The SARS-CoV-2 RNA is generally detectable in upper and lower  respiratory specimens during the acute phase of infection. The lowest  concentration of SARS-CoV-2 viral copies this assay can detect is 250  copies / mL. A negative result does not preclude SARS-CoV-2 infection  and should not be used as the sole basis for treatment or other  patient management decisions.  A negative result may occur with  improper specimen collection / handling, submission of specimen other  than nasopharyngeal swab, presence of viral mutation(s) within the  areas targeted by this assay, and inadequate number of viral copies  (<250 copies / mL). A negative result must be combined with clinical  observations, patient history, and epidemiological information. If result is POSITIVE SARS-CoV-2 target nucleic acids are DETECTED. The SARS-CoV-2 RNA is generally detectable in upper and lower  respiratory specimens dur ing the acute phase of infection.  Positive  results are indicative of active infection with SARS-CoV-2.  Clinical  correlation  with patient history and other diagnostic information is  necessary to determine patient infection status.  Positive results do  not rule out bacterial infection or co-infection with other viruses. If result is PRESUMPTIVE POSTIVE SARS-CoV-2 nucleic acids MAY BE PRESENT.  A presumptive positive result was obtained on the submitted specimen  and confirmed on repeat testing.  While 2019 novel coronavirus  (SARS-CoV-2) nucleic acids may be present in the submitted sample  additional confirmatory testing may be necessary for epidemiological  and / or clinical management purposes  to differentiate between  SARS-CoV-2 and other Sarbecovirus currently known to infect humans.  If clinically indicated additional testing with an alternate test  methodology [phone]) is advised. The SARS-CoV-2 RNA is generally  detectable in upper and lower respiratory sp ecimens during the acute  phase of infection. The expected result is Negative. Fact Sheet for Patients:  StrictlyIdeas.no Fact Sheet for Healthcare Providers: BankingDealers.co.za This test is not yet approved or cleared by the [place] FDA and has been authorized for detection and/or diagnosis of SARS-CoV-2 by FDA under an Emergency Use Authorization (EUA).  This EUA will remain in effect (meaning this test can be used) for the duration of the COVID-19 declaration under Section 564(b)(1) of the Act, 21 U.S.C. section 360bbb-3(b)(1), unless the authorization is terminated or revoked sooner. Performed at [facility], [place] [address], [place], [place] [address]   Blood Culture (routine x 2)     Status: None (Preliminary result)   Collection Time: 11/06/18  4:30 PM   Specimen: BLOOD LEFT ARM  Result Value Ref Range Status   Specimen Description BLOOD LEFT ARM  Final   Special Requests   Final    BOTTLES DRAWN AEROBIC AND ANAEROBIC Blood Culture adequate volume   Culture   Final    NO  GROWTH 4 DAYS Performed at [facility], [place] [address], [place], [place] [address]    Report Status PENDING  Incomplete  MRSA PCR Screening     Status: None   Collection Time: 11/07/18 12:01 AM   Specimen: Nasal Mucosa; Nasopharyngeal  Result Value Ref Range Status   MRSA by PCR NEGATIVE NEGATIVE Final    Comment:        The GeneXpert MRSA Assay (FDA approved for NASAL specimens only), is one component of a comprehensive MRSA colonization surveillance program. It is not intended to diagnose MRSA infection nor to guide or monitor treatment for MRSA infections. Performed at [facility], [place] [address] [place], [place] [address]   Culture, blood (Routine X 2) w Reflex to ID Panel     Status: None (Preliminary result)   Collection Time: 11/09/18  5:42 AM   Specimen: BLOOD  Result Value Ref Range Status   Specimen Description BLOOD RIGHT ARM  Final   Special Requests   Final    IN PEDIATRIC BOTTLE Blood Culture results may not be optimal due to an excessive volume of blood received in culture bottles   Culture   Final    NO GROWTH 1 DAY Performed at [facility], [place] [address], [place], [place] [address]    Report Status PENDING  Incomplete  Culture, blood (Routine X 2) w Reflex to ID Panel     Status: None (Preliminary result)   Collection Time: 11/09/18  5:42 AM   Specimen: BLOOD  Result Value Ref Range Status   Specimen Description BLOOD RIGHT ARM  Final   Special Requests   Final    BOTTLES DRAWN AEROBIC ONLY Blood Culture results may not be optimal due to an excessive volume of blood received in culture bottles   Culture   Final    NO GROWTH 1 DAY Performed at [facility], [place] [address], [place], [place] [address]    Report Status PENDING  Incomplete    Signed: Marlowe Aschoff Bridgid Printz  [facility] 11/10/2018, 1:23 PM

## 2018-11-11 LAB — CULTURE, BLOOD (ROUTINE X 2)
Culture: NO GROWTH
Special Requests: ADEQUATE

## 2018-11-14 LAB — CULTURE, BLOOD (ROUTINE X 2)
Culture: NO GROWTH
Culture: NO GROWTH

## 2020-09-23 ENCOUNTER — Emergency Department (HOSPITAL_COMMUNITY): Payer: Self-pay

## 2020-09-23 ENCOUNTER — Encounter (HOSPITAL_COMMUNITY): Payer: Self-pay | Admitting: *Deleted

## 2020-09-23 ENCOUNTER — Emergency Department (HOSPITAL_COMMUNITY)
Admission: EM | Admit: 2020-09-23 | Discharge: 2020-09-23 | Disposition: A | Discharge status: Home / Self Care | Payer: Self-pay | Attending: Emergency Medicine | Admitting: Emergency Medicine

## 2020-09-23 DIAGNOSIS — Z20822 Contact with and (suspected) exposure to covid-19: Secondary | ICD-10-CM | POA: Insufficient documentation

## 2020-09-23 DIAGNOSIS — R4182 Altered mental status, unspecified: Secondary | ICD-10-CM | POA: Insufficient documentation

## 2020-09-23 DIAGNOSIS — W01198A Fall on same level from slipping, tripping and stumbling with subsequent striking against other object, initial encounter: Secondary | ICD-10-CM | POA: Insufficient documentation

## 2020-09-23 DIAGNOSIS — W19XXXA Unspecified fall, initial encounter: Secondary | ICD-10-CM

## 2020-09-23 DIAGNOSIS — Z23 Encounter for immunization: Secondary | ICD-10-CM | POA: Insufficient documentation

## 2020-09-23 DIAGNOSIS — S0001XA Abrasion of scalp, initial encounter: Secondary | ICD-10-CM | POA: Insufficient documentation

## 2020-09-23 DIAGNOSIS — F17210 Nicotine dependence, cigarettes, uncomplicated: Secondary | ICD-10-CM | POA: Insufficient documentation

## 2020-09-23 DIAGNOSIS — Y900 Blood alcohol level of less than 20 mg/100 ml: Secondary | ICD-10-CM | POA: Insufficient documentation

## 2020-09-23 LAB — COMPREHENSIVE METABOLIC PANEL
ALT: 21 U/L (ref 0–44)
AST: 24 U/L (ref 15–41)
Albumin: 3.8 g/dL (ref 3.5–5.0)
Alkaline Phosphatase: 89 U/L (ref 38–126)
Anion gap: 6 (ref 5–15)
BUN: 15 mg/dL (ref 6–20)
CO2: 28 mmol/L (ref 22–32)
Calcium: 8.9 mg/dL (ref 8.9–10.3)
Chloride: 103 mmol/L (ref 98–111)
Creatinine, Ser: 0.65 mg/dL (ref 0.61–1.24)
GFR, Estimated: >60 mL/min (ref >60)
Glucose, Bld: 97 mg/dL (ref 70–99)
Potassium: 3.9 mmol/L (ref 3.5–5.1)
Sodium: 137 mmol/L (ref 135–145)
Total Bilirubin: 0.3 mg/dL (ref 0.3–1.2)
Total Protein: 7.4 g/dL (ref 6.5–8.1)

## 2020-09-23 LAB — CBC WITH DIFFERENTIAL/PLATELET
Abs Immature Granulocytes: 0.04 10*3/uL (ref 0.00–0.07)
Basophils Absolute: 0 10*3/uL (ref 0.0–0.1)
Basophils Relative: 0 %
Eosinophils Absolute: 0.1 10*3/uL (ref 0.0–0.5)
Eosinophils Relative: 1 %
HCT: 38.4 % — ABNORMAL LOW (ref 39.0–52.0)
Hemoglobin: 12.2 g/dL — ABNORMAL LOW (ref 13.0–17.0)
Immature Granulocytes: 0 %
Lymphocytes Relative: 22 %
Lymphs Abs: 2.4 10*3/uL (ref 0.7–4.0)
MCH: 27.9 pg (ref 26.0–34.0)
MCHC: 31.8 g/dL (ref 30.0–36.0)
MCV: 87.7 fL (ref 80.0–100.0)
Monocytes Absolute: 0.7 10*3/uL (ref 0.1–1.0)
Monocytes Relative: 7 %
Neutro Abs: 7.4 10*3/uL (ref 1.7–7.7)
Neutrophils Relative %: 70 %
Platelets: 247 10*3/uL (ref 150–400)
RBC: 4.38 MIL/uL (ref 4.22–5.81)
RDW: 14.9 % (ref 11.5–15.5)
WBC: 10.6 10*3/uL — ABNORMAL HIGH (ref 4.0–10.5)
nRBC: 0 % (ref 0.0–0.2)

## 2020-09-23 LAB — APTT: aPTT: 31 seconds (ref 24–36)

## 2020-09-23 LAB — PROTIME-INR
INR: 1 (ref 0.8–1.2)
Prothrombin Time: 12.9 seconds (ref 11.4–15.2)

## 2020-09-23 LAB — RAPID URINE DRUG SCREEN, HOSP PERFORMED
Amphetamines: NOT DETECTED
Barbiturates: NOT DETECTED
Benzodiazepines: POSITIVE — AB
Cocaine: NOT DETECTED
Opiates: POSITIVE — AB
Tetrahydrocannabinol: POSITIVE — AB

## 2020-09-23 LAB — RESP PANEL BY RT-PCR (FLU A&B, COVID) ARPGX2
Influenza A by PCR: NEGATIVE
Influenza B by PCR: NEGATIVE
SARS Coronavirus 2 by RT PCR: NEGATIVE

## 2020-09-23 LAB — CK: Total CK: 232 U/L (ref 49–397)

## 2020-09-23 LAB — ETHANOL: Alcohol, Ethyl (B): <10 mg/dL (ref <10)

## 2020-09-23 MED ORDER — NALOXONE HCL 2 MG/2ML IJ SOSY
PREFILLED_SYRINGE | INTRAMUSCULAR | Status: AC
  Filled 2020-09-23: qty 2

## 2020-09-23 MED ORDER — NALOXONE HCL 0.4 MG/ML IJ SOLN: 0.4000 mg | Freq: Once | INTRAMUSCULAR | Status: DC

## 2020-09-23 MED ORDER — NALOXONE HCL 4 MG/0.1ML NA LIQD: 2.0000 mg | Freq: Once | NASAL | Status: DC

## 2020-09-23 MED ORDER — TETANUS-DIPHTH-ACELL PERTUSSIS 5-2.5-18.5 LF-MCG/0.5 IM SUSY
0.5000 mL | PREFILLED_SYRINGE | Freq: Once | INTRAMUSCULAR | Status: AC
  Administered 2020-09-23: 0.5 mL via INTRAMUSCULAR
  Filled 2020-09-23: qty 0.5

## 2020-09-23 NOTE — ED Notes (Signed)
Patient provided with sandwich and water per PA request.

## 2020-09-23 NOTE — ED Notes (Addendum)
Pt friend, Manning Charity, [phone]

## 2020-09-23 NOTE — ED Triage Notes (Signed)
Pt reports falling and hitting his head this morning at 530AM. He is now sure how he fell. He reports feeling more drowsy for the past hour.

## 2020-09-23 NOTE — ED Notes (Signed)
Pt ambulated to restroom independently.  Steady gait

## 2020-09-23 NOTE — Discharge Instructions (Addendum)
Follow-up with primary care doctor because your chest x-ray showed pulmonary congestion.  If you do not have 1 you can try calling the  community health and wellness clinic.  Their contact information is included in your discharge paperwork.

## 2020-09-23 NOTE — ED Provider Notes (Signed)
Care assumed from PA [facility] at shift change, please see her note for full details, but in brief Brendan Preston is a 45 y.o. male who presents with altered mental status after patient had a fall witnessed on video footage.  He has abrasions to the back of his head, on arrival is somnolent and altered, but will awaken and respond to verbal stimuli.  Longstanding history of substance abuse including opiates and benzos.  CT imaging of the head and neck as well as x-rays of the elbow, chest and pelvis have all been reassuring.  Patient's lab work-up is overall been unremarkable as well.    Patient remains somewhat drowsy although mental status has been slowly improving.  Patient was given 1 dose of Narcan but this did not have much effect on his mental status.  Suspect substance abuse leading to increased drowsiness, plan is to continue to observe patient, if he continues to improve and becomes more alert can be discharged home.  BP 120/78 (BP Location: Right Arm)   Pulse 76   Temp 98.3 F (36.8 C) (Oral)   Resp 14   SpO2 100%     ED Course/Procedures   Labs Reviewed  CBC WITH DIFFERENTIAL/PLATELET - Abnormal; Notable for the following components:      Result Value   WBC 10.6 (*)    Hemoglobin 12.2 (*)    HCT 38.4 (*)    All other components within normal limits  RESP PANEL BY RT-PCR (FLU A&B, COVID) ARPGX2  COMPREHENSIVE METABOLIC PANEL  CK  PROTIME-INR  APTT  ETHANOL  RAPID URINE DRUG SCREEN, HOSP PERFORMED   DG Elbow Complete Left  Result Date: 09/23/2020 CLINICAL DATA:  Fall with left elbow laceration EXAM: LEFT ELBOW - COMPLETE 3+ VIEW COMPARISON:  10/13/2014 FINDINGS: There is no evidence of fracture, dislocation, or joint effusion. There is no evidence of arthropathy or other focal bone abnormality. Soft tissue swelling overlies the posterior aspect of the elbow and proximal forearm. No radiopaque foreign body. IMPRESSION: Soft tissue swelling without acute fracture or  dislocation. Electronically Signed   By: Duanne Guess D.O.   On: 09/23/2020 13:29   CT Head Wo Contrast  Result Date: 09/23/2020 CLINICAL DATA:  Fall, head injury EXAM: CT HEAD WITHOUT CONTRAST CT CERVICAL SPINE WITHOUT CONTRAST TECHNIQUE: Multidetector CT imaging of the head and cervical spine was performed following the standard protocol without intravenous contrast. Multiplanar CT image reconstructions of the cervical spine were also generated. COMPARISON:  CT head 04/22/2012 FINDINGS: CT HEAD FINDINGS Brain: No evidence of acute infarction, hemorrhage, hydrocephalus, extra-axial collection or mass lesion/mass effect. Vascular: Negative for hyperdense vessel Skull: Negative for skull fracture. Posterior scalp hematoma in the midline. Sinuses/Orbits: Negative Other: None CT CERVICAL SPINE FINDINGS Alignment: Mild anterolisthesis C3-4. Reversal of the cervical lordosis with mild kyphosis. Skull base and vertebrae: Negative for fracture. Soft tissues and spinal canal: Negative Disc levels: Disc degeneration and spurring most prominent C5-6. Mild left foraminal narrowing. Upper chest: Lung apices clear bilaterally. Other: None IMPRESSION: 1. No acute intracranial abnormality.  Posterior scalp hematoma 2. Negative for cervical fracture. Electronically Signed   By: Marlan Palau M.D.   On: 09/23/2020 13:16   CT Cervical Spine Wo Contrast  Result Date: 09/23/2020 CLINICAL DATA:  Fall, head injury EXAM: CT HEAD WITHOUT CONTRAST CT CERVICAL SPINE WITHOUT CONTRAST TECHNIQUE: Multidetector CT imaging of the head and cervical spine was performed following the standard protocol without intravenous contrast. Multiplanar CT image reconstructions of the cervical spine  were also generated. COMPARISON:  CT head 04/22/2012 FINDINGS: CT HEAD FINDINGS Brain: No evidence of acute infarction, hemorrhage, hydrocephalus, extra-axial collection or mass lesion/mass effect. Vascular: Negative for hyperdense vessel Skull:  Negative for skull fracture. Posterior scalp hematoma in the midline. Sinuses/Orbits: Negative Other: None CT CERVICAL SPINE FINDINGS Alignment: Mild anterolisthesis C3-4. Reversal of the cervical lordosis with mild kyphosis. Skull base and vertebrae: Negative for fracture. Soft tissues and spinal canal: Negative Disc levels: Disc degeneration and spurring most prominent C5-6. Mild left foraminal narrowing. Upper chest: Lung apices clear bilaterally. Other: None IMPRESSION: 1. No acute intracranial abnormality.  Posterior scalp hematoma 2. Negative for cervical fracture. Electronically Signed   By: Marlan Palau M.D.   On: 09/23/2020 13:16   DG Pelvis Portable  Result Date: 09/23/2020 CLINICAL DATA:  Fall EXAM: PORTABLE PELVIS 1-2 VIEWS COMPARISON:  None. FINDINGS: There is no evidence of pelvic fracture or diastasis. No pelvic bone lesions are seen. Moderate volume of stool within the visualized colon. IMPRESSION: Negative. Electronically Signed   By: Duanne Guess D.O.   On: 09/23/2020 13:28   DG Chest Portable 1 View  Result Date: 09/23/2020 CLINICAL DATA:  Fall, drowsiness EXAM: PORTABLE CHEST 1 VIEW COMPARISON:  11/06/2018 FINDINGS: Heart size is upper limits of normal. Mild pulmonary vascular congestion. No focal airspace consolidation, pleural effusion, or pneumothorax. The visualized skeletal structures are unremarkable. IMPRESSION: 1. Mild pulmonary vascular congestion without overt edema or focal airspace consolidation. 2. Heart size upper limits of normal. Electronically Signed   By: Duanne Guess D.O.   On: 09/23/2020 13:30    Procedures  MDM   Patient observed for 3 hours after shift change, he is now more alert, ambulatory with steady gait, eating and drinking and able to make phone call.  Work-up is overall been reassuring.  Patient has abrasions to the back of the head but no laceration requiring repair.  There was some vascular congestion noted on chest x-ray but without  evidence of edema, lungs are clear and patient without hypoxia, chest pain or shortness of breath.  Encouraged outpatient follow-up.  Patient's friend is coming to pick him up.       Dartha Lodge, PA-C 09/23/20 1826    Mancel Bale, MD 09/25/20 [phone]

## 2020-09-23 NOTE — ED Provider Notes (Signed)
[facility]-EMERGENCY DEPT Provider Note   CSN: [id number] Arrival date & time: 09/23/20  1155     History Chief Complaint  Patient presents with   Head Injury    Brendan RacerJeremy Preston is a 45 y.o. male with past medical history significant for substance abuse, anxiety, hiatal hernia.  HPI Level 5 caveat applies as patient able to provide history due to altered mental status.  Patient's friend Francee PiccoloDerek has a phone number listed in the chart.  Patient tells me it is okay if I call him get more history of what happened.  Ladene ArtistDerrick states that he was called by police around 10 AM this morning.  They were on scene with patient after a fall.  They had reviewed nearby video footage that showed patient was standing on the ground close his eyes and fell backwards hitting his head on the corner of a cement wall.  There is unsure if there is any loss of consciousness.  Patient was refusing treatment.  Francee PiccoloDerek was able to arrive on scene and encouraged patient to come to the ER to be evaluated.  He states that patient has longstanding history of substance abuse.  He is likely on Xanax and opiates.  Patient is currently homeless and between houses.      Past Medical History:  Diagnosis Date   Anxiety    History of hiatal hernia    Substance abuse [facility](HCC)     Patient Active Problem List   Diagnosis Date Noted   Sepsis (HCC) 11/06/2018   Tobacco abuse 11/06/2018   Heroin abuse (HCC) 10/13/2014   Cellulitis of left upper arm 10/13/2014   Hypokalemia 10/13/2014   Hyponatremia 10/13/2014   Sepsis due to cellulitis (HCC) 10/13/2014   Substance abuse (HCC) 04/16/2013    Past Surgical History:  Procedure Laterality Date   HERNIA REPAIR     I & D EXTREMITY Left 10/13/2014   Procedure: IRRIGATION AND DEBRIDEMENT EXTREMITY;  Surgeon: Betha LoaKevin Kuzma, MD;  Location: [facility] OR;  Service: Orthopedics;  Laterality: Left;       Family History  Problem Relation Age of Onset   Cancer Other      Social History   Tobacco Use   Smoking status: Every Day    Packs/day: 0.50    Pack years: 0.00    Types: Cigarettes   Smokeless tobacco: Never  Vaping Use   Vaping Use: Never used  Substance Use Topics   Alcohol use: Yes    Comment: Once a week.    Drug use: Yes    Types: Marijuana, Heroin    Comment: Hx of Heroin but denies currently. Last used: Marijuana Yesterday     Home Medications Prior to Admission medications   Not on File    Allergies    Penicillins  Review of Systems   Review of Systems  Unable to perform ROS: Mental status change   Physical Exam Updated Vital Signs BP 122/87 (BP Location: Left Arm)   Pulse 81   Temp 98.3 F (36.8 C) (Oral)   Resp 18   SpO2 94%   Physical Exam Vitals and nursing note reviewed.  Constitutional:      General: He is not in acute distress.    Appearance: He is not ill-appearing.     Comments: Patient falling asleep during exam and unable to provide history.  Dried blood on patient's torso, shirt, and pants.  HENT:     Head: Normocephalic. No raccoon eyes, Battle's sign, right periorbital erythema  or left periorbital erythema.     Jaw: There is normal jaw occlusion.     Comments: Dried blood on posterior scalp with abrasions noted.  Unable to see if there is a wound because of dried blood.  Tenderness to palpation of occipital scalp.    Right Ear: Tympanic membrane and external ear normal. No mastoid tenderness. No hemotympanum.     Left Ear: Tympanic membrane and external ear normal. No mastoid tenderness. No hemotympanum.     Nose: Nose normal.     Right Nostril: No epistaxis or septal hematoma.     Left Nostril: No epistaxis or septal hematoma.     Mouth/Throat:     Mouth: Mucous membranes are moist.     Pharynx: Oropharynx is clear.  Eyes:     General: No scleral icterus.       Right eye: No discharge.        Left eye: No discharge.     Extraocular Movements: Extraocular movements intact.      Conjunctiva/sclera: Conjunctivae normal.     Pupils: Pupils are equal, round, and reactive to light.  Neck:     Vascular: No JVD.  Cardiovascular:     Rate and Rhythm: Normal rate and regular rhythm.     Pulses: Normal pulses.          Radial pulses are 2+ on the right side and 2+ on the left side.     Heart sounds: Normal heart sounds.  Pulmonary:     Comments: Lungs clear to auscultation in all fields. Symmetric chest rise. No wheezing, rales, or rhonchi. Chest:     Comments: No anterior chest wall tenderness.  No deformity or crepitus noted.  No evidence of flail chest.   Abdominal:     Comments: Abdomen is soft, non-distended, and non-tender in all quadrants. No rigidity, no guarding. No peritoneal signs.  Musculoskeletal:        General: Normal range of motion.     Cervical back: Normal range of motion.     Comments: Moving all extremities without signs of obvious injury.  Abrasions on left elbow.    Nontender to palpation of chest and pelvis.  No bruising noted.    Skin:    General: Skin is warm and dry.     Capillary Refill: Capillary refill takes less than 2 seconds.  Neurological:     Mental Status: He is oriented to person, place, and time.     GCS: GCS eye subscore is 4. GCS verbal subscore is 5. GCS motor subscore is 6.     Comments: Fluent speech, no facial droop.  Patient follows commands.  He is sleepy but able to be woken up with voice.  Strong equal grip strength in all extremities.  Psychiatric:        Behavior: Behavior normal.        Thought Content: Thought content does not include homicidal or suicidal ideation.    ED Results / Procedures / Treatments   Labs (all labs ordered are listed, but only abnormal results are displayed) Labs Reviewed  CBC WITH DIFFERENTIAL/PLATELET - Abnormal; Notable for the following components:      Result Value   WBC 10.6 (*)    Hemoglobin 12.2 (*)    HCT 38.4 (*)    All other components within normal limits   COMPREHENSIVE METABOLIC PANEL  CK  PROTIME-INR  APTT  ETHANOL  RAPID URINE DRUG SCREEN, HOSP PERFORMED    EKG None  Radiology DG Elbow Complete Left  Result Date: 09/23/2020 CLINICAL DATA:  Fall with left elbow laceration EXAM: LEFT ELBOW - COMPLETE 3+ VIEW COMPARISON:  10/13/2014 FINDINGS: There is no evidence of fracture, dislocation, or joint effusion. There is no evidence of arthropathy or other focal bone abnormality. Soft tissue swelling overlies the posterior aspect of the elbow and proximal forearm. No radiopaque foreign body. IMPRESSION: Soft tissue swelling without acute fracture or dislocation. Electronically Signed   By: Duanne Guess D.O.   On: 09/23/2020 13:29   CT Head Wo Contrast  Result Date: 09/23/2020 CLINICAL DATA:  Fall, head injury EXAM: CT HEAD WITHOUT CONTRAST CT CERVICAL SPINE WITHOUT CONTRAST TECHNIQUE: Multidetector CT imaging of the head and cervical spine was performed following the standard protocol without intravenous contrast. Multiplanar CT image reconstructions of the cervical spine were also generated. COMPARISON:  CT head 04/22/2012 FINDINGS: CT HEAD FINDINGS Brain: No evidence of acute infarction, hemorrhage, hydrocephalus, extra-axial collection or mass lesion/mass effect. Vascular: Negative for hyperdense vessel Skull: Negative for skull fracture. Posterior scalp hematoma in the midline. Sinuses/Orbits: Negative Other: None CT CERVICAL SPINE FINDINGS Alignment: Mild anterolisthesis C3-4. Reversal of the cervical lordosis with mild kyphosis. Skull base and vertebrae: Negative for fracture. Soft tissues and spinal canal: Negative Disc levels: Disc degeneration and spurring most prominent C5-6. Mild left foraminal narrowing. Upper chest: Lung apices clear bilaterally. Other: None IMPRESSION: 1. No acute intracranial abnormality.  Posterior scalp hematoma 2. Negative for cervical fracture. Electronically Signed   By: Marlan Palau M.D.   On: 09/23/2020 13:16    CT Cervical Spine Wo Contrast  Result Date: 09/23/2020 CLINICAL DATA:  Fall, head injury EXAM: CT HEAD WITHOUT CONTRAST CT CERVICAL SPINE WITHOUT CONTRAST TECHNIQUE: Multidetector CT imaging of the head and cervical spine was performed following the standard protocol without intravenous contrast. Multiplanar CT image reconstructions of the cervical spine were also generated. COMPARISON:  CT head 04/22/2012 FINDINGS: CT HEAD FINDINGS Brain: No evidence of acute infarction, hemorrhage, hydrocephalus, extra-axial collection or mass lesion/mass effect. Vascular: Negative for hyperdense vessel Skull: Negative for skull fracture. Posterior scalp hematoma in the midline. Sinuses/Orbits: Negative Other: None CT CERVICAL SPINE FINDINGS Alignment: Mild anterolisthesis C3-4. Reversal of the cervical lordosis with mild kyphosis. Skull base and vertebrae: Negative for fracture. Soft tissues and spinal canal: Negative Disc levels: Disc degeneration and spurring most prominent C5-6. Mild left foraminal narrowing. Upper chest: Lung apices clear bilaterally. Other: None IMPRESSION: 1. No acute intracranial abnormality.  Posterior scalp hematoma 2. Negative for cervical fracture. Electronically Signed   By: Marlan Palau M.D.   On: 09/23/2020 13:16   DG Pelvis Portable  Result Date: 09/23/2020 CLINICAL DATA:  Fall EXAM: PORTABLE PELVIS 1-2 VIEWS COMPARISON:  None. FINDINGS: There is no evidence of pelvic fracture or diastasis. No pelvic bone lesions are seen. Moderate volume of stool within the visualized colon. IMPRESSION: Negative. Electronically Signed   By: Duanne Guess D.O.   On: 09/23/2020 13:28   DG Chest Portable 1 View  Result Date: 09/23/2020 CLINICAL DATA:  Fall, drowsiness EXAM: PORTABLE CHEST 1 VIEW COMPARISON:  11/06/2018 FINDINGS: Heart size is upper limits of normal. Mild pulmonary vascular congestion. No focal airspace consolidation, pleural effusion, or pneumothorax. The visualized skeletal  structures are unremarkable. IMPRESSION: 1. Mild pulmonary vascular congestion without overt edema or focal airspace consolidation. 2. Heart size upper limits of normal. Electronically Signed   By: Duanne Guess D.O.   On: 09/23/2020 13:30    Procedures Procedures  Medications Ordered in ED Medications  Tdap (BOOSTRIX) injection 0.5 mL (0.5 mLs Intramuscular Given 09/23/20 1439)  naloxone (NARCAN) 2 MG/2ML injection (  Given 09/23/20 1441)    ED Course  I have reviewed the triage vital signs and the nursing notes.  Pertinent labs & imaging results that were available during my care of the patient were reviewed by me and considered in my medical decision making (see chart for details).    MDM Rules/Calculators/A&P                          History obtained from patient and his friend over the phone with additional history obtained from chart review.    Patient presenting after head injury with a fall from standing.  He is drowsy during exam although will wake up and has appropriate answers when prompted by repetitive questioning.  Patient has abrasions to posterior scalp with dried blood on his torso and close.  He also has abrasions of his left elbow. No signs of facial or neck or back injury. He is moving all extremities without signs of injury. Does have abrasions to left elbow. Basic labs obtained given lack of history of presenting illness and show nonspecific leukocytosis of 10.6, hemoglobin 12.2, no prior to compare.  CMP unremarkable.  CK within normal range.  Alcohol level is negative.  PTT and INR normal.  EKG without obvious ischemia. Chest xray shows mild pulmonary vascular congestion without overt edema or focal airspace consolidation and heart size upper limits of normal.  He has no history of CHF.  He was given intranasal Narcan here, did not receive any prior to arrival.  No change in mental status after Narcan given.  He has no hypoxia or complaints of chest pain and  shortness of breath. X-ray of left elbow and pelvis is negative for fracture or dislocation. CT head shows no intracranial abnormality, does have posterior scalp hematoma.  CT cervical spine is negative.  Abrasions on posterior scalp not amendable to repair.  Tetanus updated.  Wounds were irrigated.  Patient still somnolent, UDS not yet obtained.   Patient care transferred to K. Ford PA-C at the end of my shift pending patient mental status returning to baseline.  When he is clinically sober he will need to be ambulated to ensure there is no hypoxia, shortness of breath or chest pain.  If vitals remain stable he can follow-up outpatient with PCP about mild pulmonary vascular congestion. Patient presentation, ED course, and plan of care discussed with review of all pertinent labs and imaging. Please see her note for further details regarding further ED course and disposition.    Portions of this note were generated with Scientist, clinical (histocompatibility and immunogenetics). Dictation errors may occur despite best attempts at proofreading.   Final Clinical Impression(s) / ED Diagnoses Final diagnoses:  Fall, initial encounter    Rx / DC Orders ED Discharge Orders     None        Kandice Hams 09/23/20 1539    Bethann Berkshire, MD 09/25/20 1009

## 2022-10-01 IMAGING — CT CT HEAD W/O CM
4 series · 16 of 47 positions shown, 18 images · non-contrast
Comparison: CT head 04/22/2012

CLINICAL DATA: Fall, head injury

EXAM:
CT HEAD WITHOUT CONTRAST
CT CERVICAL SPINE WITHOUT CONTRAST
TECHNIQUE: Multidetector CT imaging of the head and cervical spine was
performed following the standard protocol without intravenous
contrast. Multiplanar CT image reconstructions of the cervical spine
were also generated.

[Series 3: head wo · axial · 0.47mm/px · z∈[-108,+12]mm · 7 of 32 slices shown, 9 images]
[im 4/32  brain]
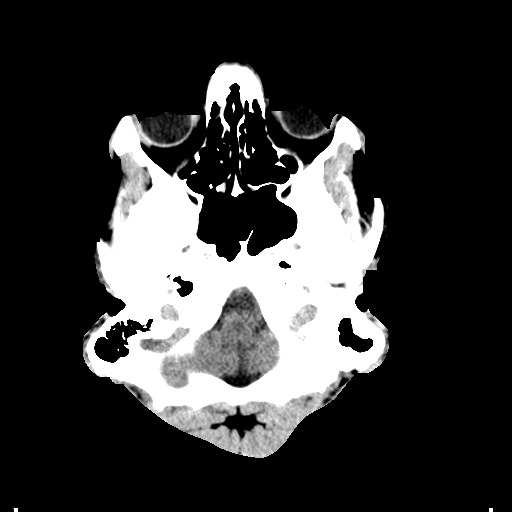
[im 4/32  bone]
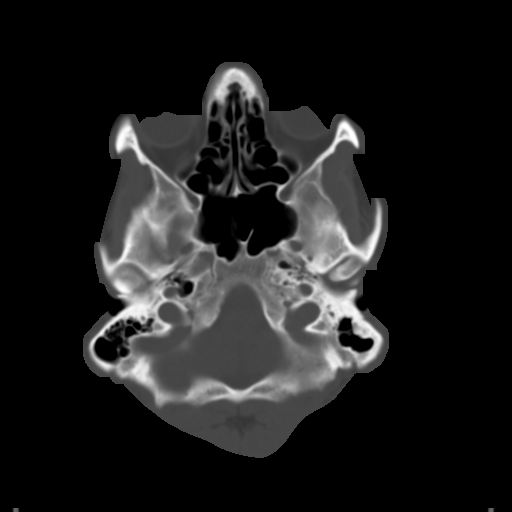
[im 8/32  brain]
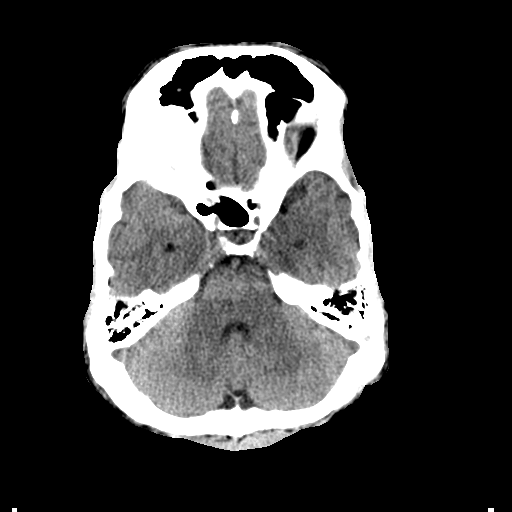
[im 12/32  brain]
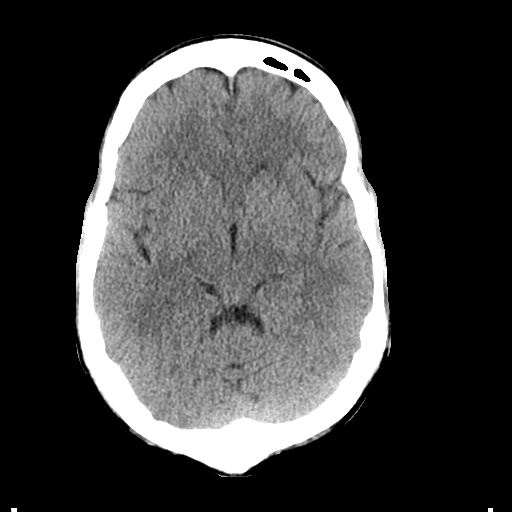
[im 16/32  brain]
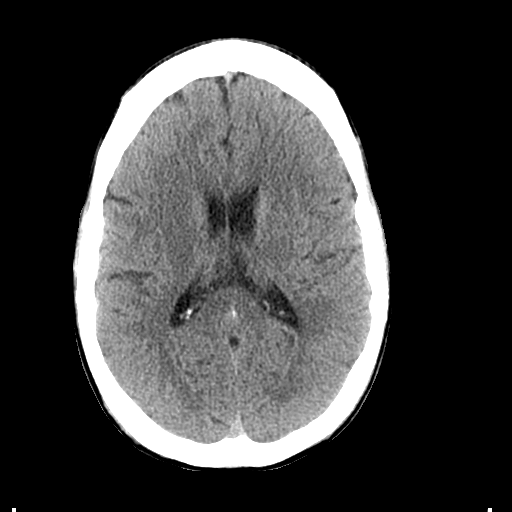
[im 20/32  brain]
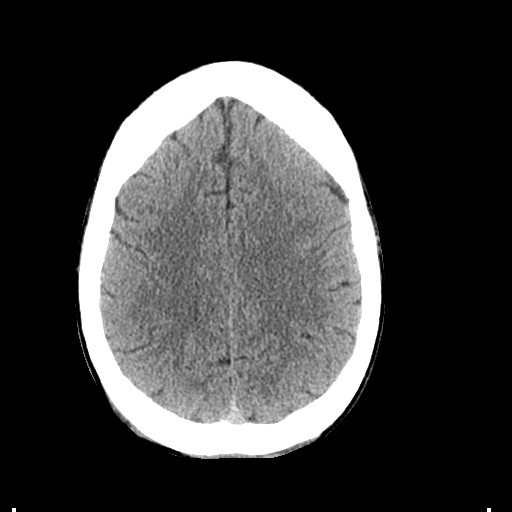
[im 20/32  bone]
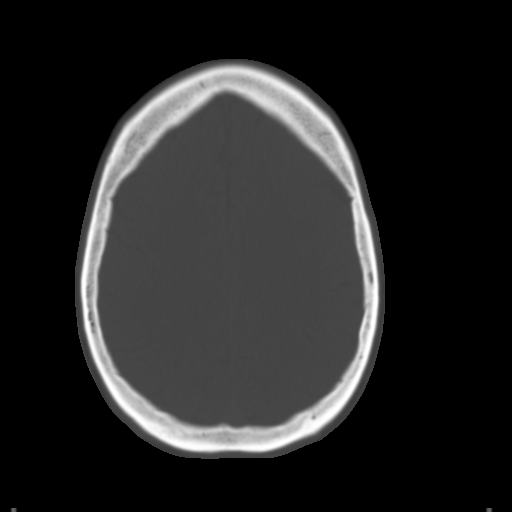
[im 24/32  brain]
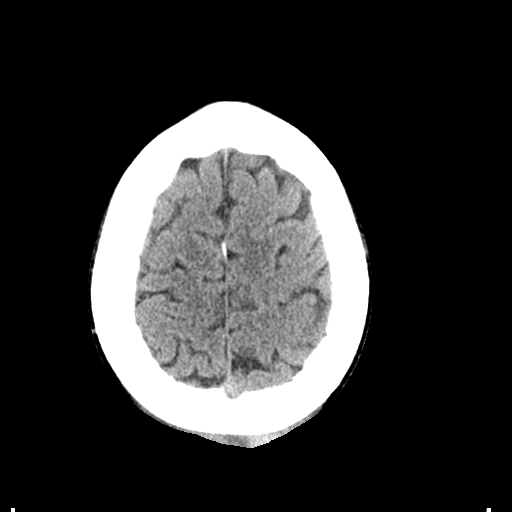
[im 28/32  brain]
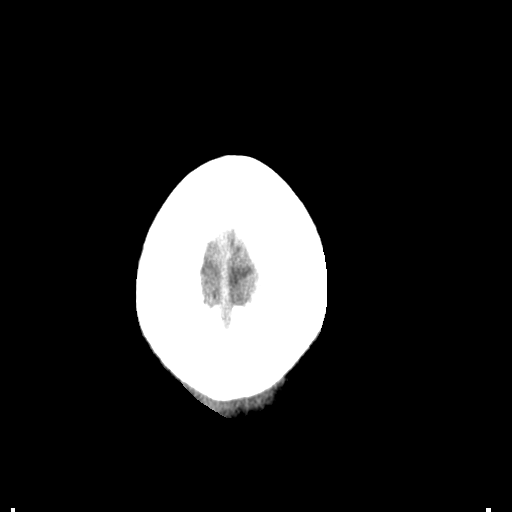

[Series 4: head bone · axial · 0.47mm/px · z∈[-108,-76]mm · 3 of 80 slices shown]
[im 8/80  bone]
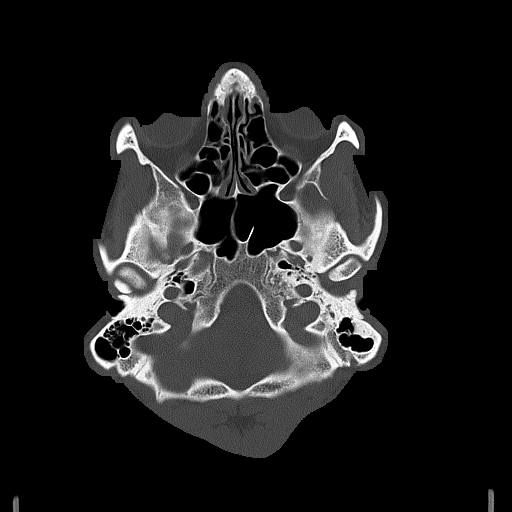
[im 16/80  bone]
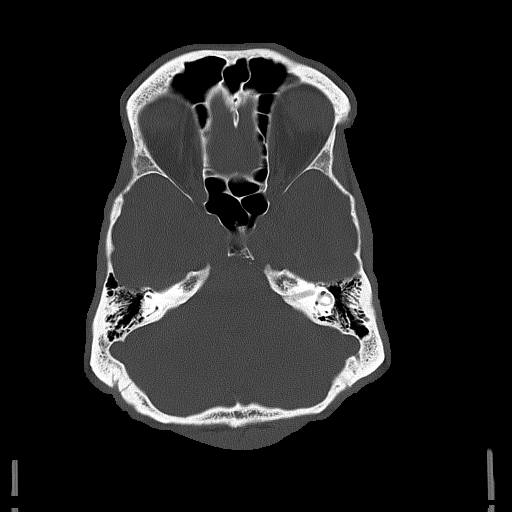
[im 24/80  bone]
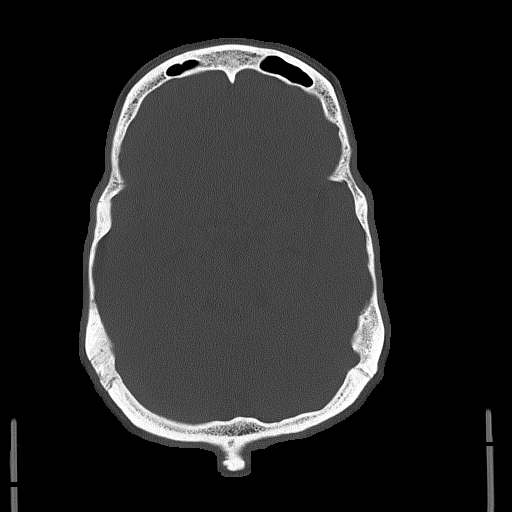

[Series 5: coronal soft tissue · coronal · 0.29mm/px · 3 of 71 slices shown]
[im 24/71  brain]
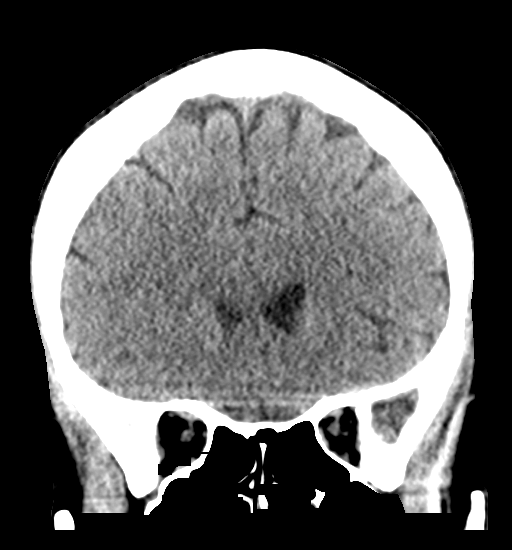
[im 32/71  brain]
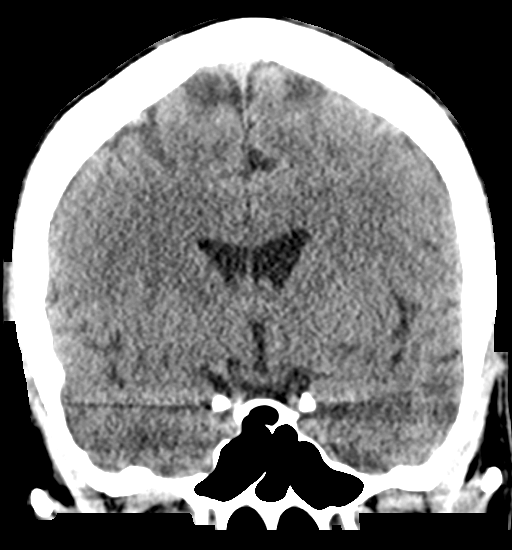
[im 39/71  brain]
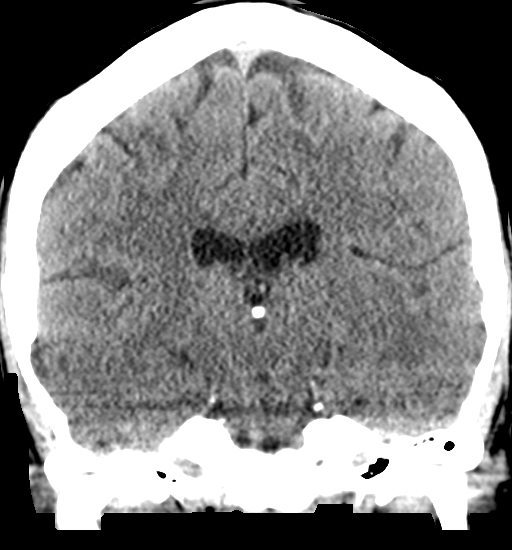

[Series 6: sagittal soft tissue · sagittal · 0.31mm/px · 3 of 52 slices shown]
[im 18/52  brain]
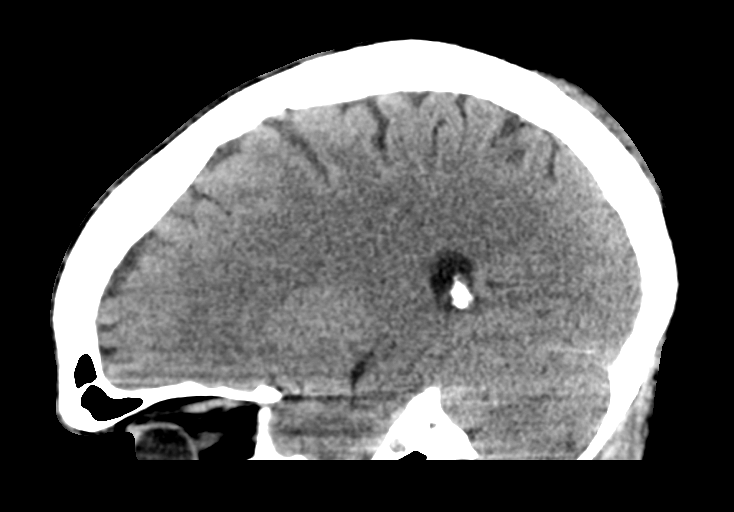
[im 26/52  brain]
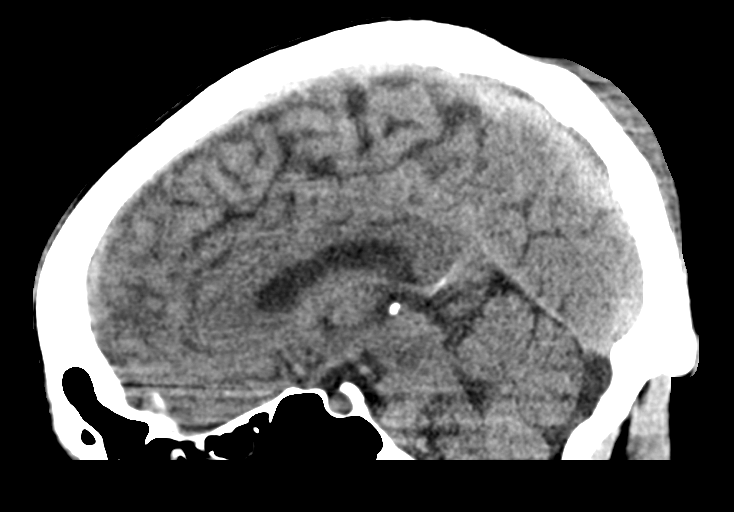
[im 35/52  brain]
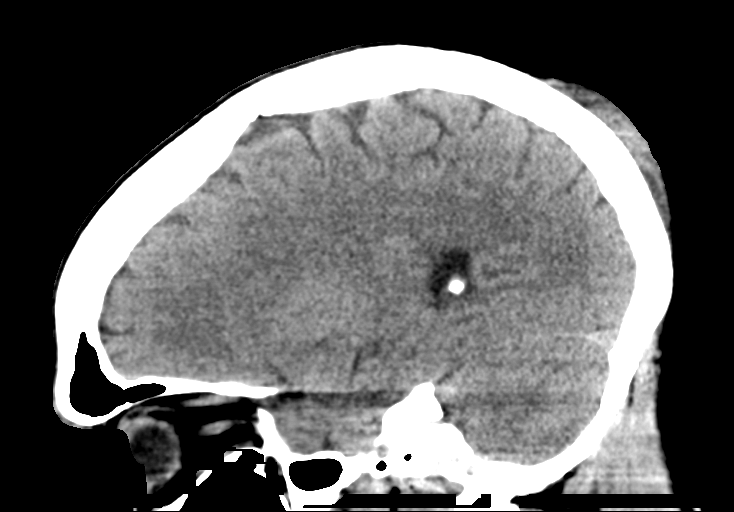

[16 of 47 positions shown; findings below may reference images not displayed]

FINDINGS: CT HEAD FINDINGS

Brain: No evidence of acute infarction, hemorrhage, hydrocephalus,
extra-axial collection or mass lesion/mass effect.

Vascular: Negative for hyperdense vessel

Skull: Negative for skull fracture. Posterior scalp hematoma in the
midline.

Sinuses/Orbits: Negative

Other: None

CT CERVICAL SPINE FINDINGS

Alignment: Mild anterolisthesis C3-4. Reversal of the cervical
lordosis with mild kyphosis.

Skull base and vertebrae: Negative for fracture.

Soft tissues and spinal canal: Negative

Disc levels: Disc degeneration and spurring most prominent C5-6.
Mild left foraminal narrowing.

Upper chest: Lung apices clear bilaterally.

Other: None
IMPRESSION: 1. No acute intracranial abnormality.  Posterior scalp hematoma
2. Negative for cervical fracture.

## 2023-03-27 ENCOUNTER — Emergency Department (HOSPITAL_COMMUNITY)
Admission: EM | Admit: 2023-03-27 | Discharge: 2023-03-28 | Disposition: A | Discharge status: Home / Self Care | Payer: Self-pay | Attending: Emergency Medicine | Admitting: Emergency Medicine

## 2023-03-27 ENCOUNTER — Emergency Department (HOSPITAL_COMMUNITY): Payer: Self-pay

## 2023-03-27 ENCOUNTER — Other Ambulatory Visit: Payer: Self-pay

## 2023-03-27 ENCOUNTER — Encounter (HOSPITAL_COMMUNITY): Payer: Self-pay | Admitting: Emergency Medicine

## 2023-03-27 DIAGNOSIS — T50904A Poisoning by unspecified drugs, medicaments and biological substances, undetermined, initial encounter: Secondary | ICD-10-CM

## 2023-03-27 DIAGNOSIS — R001 Bradycardia, unspecified: Secondary | ICD-10-CM | POA: Insufficient documentation

## 2023-03-27 DIAGNOSIS — F17210 Nicotine dependence, cigarettes, uncomplicated: Secondary | ICD-10-CM | POA: Insufficient documentation

## 2023-03-27 DIAGNOSIS — T50914A Poisoning by multiple unspecified drugs, medicaments and biological substances, undetermined, initial encounter: Secondary | ICD-10-CM | POA: Insufficient documentation

## 2023-03-27 LAB — RAPID URINE DRUG SCREEN, HOSP PERFORMED
Amphetamines: POSITIVE — AB
Barbiturates: NOT DETECTED
Benzodiazepines: POSITIVE — AB
Cocaine: NOT DETECTED
Opiates: NOT DETECTED
Tetrahydrocannabinol: NOT DETECTED

## 2023-03-27 LAB — COMPREHENSIVE METABOLIC PANEL
ALT: 21 U/L (ref 0–44)
AST: 18 U/L (ref 15–41)
Albumin: 3.9 g/dL (ref 3.5–5.0)
Alkaline Phosphatase: 81 U/L (ref 38–126)
Anion gap: 8 (ref 5–15)
BUN: 19 mg/dL (ref 6–20)
CO2: 26 mmol/L (ref 22–32)
Calcium: 9 mg/dL (ref 8.9–10.3)
Chloride: 103 mmol/L (ref 98–111)
Creatinine, Ser: 0.76 mg/dL (ref 0.61–1.24)
GFR, Estimated: >60 mL/min (ref >60)
Glucose, Bld: 151 mg/dL — ABNORMAL HIGH (ref 70–99)
Potassium: 4.4 mmol/L (ref 3.5–5.1)
Sodium: 137 mmol/L (ref 135–145)
Total Bilirubin: 0.3 mg/dL (ref <1.2)
Total Protein: 7.1 g/dL (ref 6.5–8.1)

## 2023-03-27 LAB — CBC WITH DIFFERENTIAL/PLATELET
Abs Immature Granulocytes: 0.01 10*3/uL (ref 0.00–0.07)
Basophils Absolute: 0 10*3/uL (ref 0.0–0.1)
Basophils Relative: 0 %
Eosinophils Absolute: 0.1 10*3/uL (ref 0.0–0.5)
Eosinophils Relative: 1 %
HCT: 37.8 % — ABNORMAL LOW (ref 39.0–52.0)
Hemoglobin: 12.4 g/dL — ABNORMAL LOW (ref 13.0–17.0)
Immature Granulocytes: 0 %
Lymphocytes Relative: 45 %
Lymphs Abs: 3.5 10*3/uL (ref 0.7–4.0)
MCH: 28.2 pg (ref 26.0–34.0)
MCHC: 32.8 g/dL (ref 30.0–36.0)
MCV: 85.9 fL (ref 80.0–100.0)
Monocytes Absolute: 0.3 10*3/uL (ref 0.1–1.0)
Monocytes Relative: 4 %
Neutro Abs: 3.9 10*3/uL (ref 1.7–7.7)
Neutrophils Relative %: 50 %
Platelets: 233 10*3/uL (ref 150–400)
RBC: 4.4 MIL/uL (ref 4.22–5.81)
RDW: 13.8 % (ref 11.5–15.5)
WBC: 7.9 10*3/uL (ref 4.0–10.5)
nRBC: 0 % (ref 0.0–0.2)

## 2023-03-27 MED ORDER — SODIUM CHLORIDE 0.9 % IV BOLUS
1000.0000 mL | Freq: Once | INTRAVENOUS | Status: AC
  Administered 2023-03-27: 1000 mL via INTRAVENOUS

## 2023-03-27 MED ORDER — NALOXONE HCL 0.4 MG/ML IJ SOLN
INTRAMUSCULAR | Status: AC
  Filled 2023-03-27: qty 1

## 2023-03-27 MED ORDER — NALOXONE HCL 0.4 MG/ML IJ SOLN
0.2000 mg | Freq: Once | INTRAMUSCULAR | Status: AC
  Administered 2023-03-27: 0.2 mg via INTRAVENOUS

## 2023-03-27 MED ORDER — NALOXONE HCL 0.4 MG/ML IJ SOLN
0.2000 mg | Freq: Once | INTRAMUSCULAR | Status: AC
  Administered 2023-03-27: 0.2 mg via INTRAVENOUS
  Filled 2023-03-27: qty 1

## 2023-03-27 NOTE — ED Provider Notes (Signed)
[place] EMERGENCY DEPARTMENT AT [facility] Provider Note  CSN: [id number] Arrival date & time: 03/27/23 1655  Chief Complaint(s) Drug Overdose  HPI Brendan Preston is a 47 y.o. male history of polysubstance abuse presenting to the emergency department with suspected overdose.  The patient was apparently at home with roommates, they saw him use some type of substance and then become unresponsive.  They called the paramedics, paramedics gave him Narcan.  They do report he was apneic on arrival and did have some resumption of spontaneous breathing.  Blood sugar was reassuring.  No further history is obtainable due to altered mental status   Past Medical History Past Medical History:  Diagnosis Date   Anxiety    History of hiatal hernia    Substance abuse (HCC)    Patient Active Problem List   Diagnosis Date Noted   Sepsis (HCC) 11/06/2018   Tobacco abuse 11/06/2018   Heroin abuse (HCC) 10/13/2014   Cellulitis of left upper arm 10/13/2014   Hypokalemia 10/13/2014   Hyponatremia 10/13/2014   Sepsis due to cellulitis (HCC) 10/13/2014   Substance abuse (HCC) 04/16/2013   Home Medication(s) Prior to Admission medications   Not on File                                                                                                                                    Past Surgical History Past Surgical History:  Procedure Laterality Date   HERNIA REPAIR     I & D EXTREMITY Left 10/13/2014   Procedure: IRRIGATION AND DEBRIDEMENT EXTREMITY;  Surgeon: Betha Loa, MD;  Location: [facility] OR;  Service: Orthopedics;  Laterality: Left;   Family History Family History  Problem Relation Age of Onset   Cancer Other     Social History Social History   Tobacco Use   Smoking status: Every Day    Current packs/day: 0.50    Types: Cigarettes   Smokeless tobacco: Never  Vaping Use   Vaping status: Never Used  Substance Use Topics   Alcohol use: Yes    Comment: Once a week.     Drug use: Yes    Types: Marijuana, Heroin    Comment: Hx of Heroin but denies currently. Last used: Marijuana Yesterday    Allergies Penicillins  Review of Systems Review of Systems  Unable to perform ROS: Mental status change    Physical Exam Vital Signs  I have reviewed the triage vital signs BP (!) 122/92   Pulse (!) 59   Temp 97.8 F (36.6 C) (Oral)   Resp 13   SpO2 99%  Physical Exam Vitals and nursing note reviewed.  Constitutional:      General: He is in acute distress.     Appearance: Normal appearance.  HENT:     Head: Normocephalic and atraumatic.     Mouth/Throat:     Mouth: Mucous membranes are moist.  Eyes:  Conjunctiva/sclera: Conjunctivae normal.     Comments: Pinpoint pupils  Cardiovascular:     Rate and Rhythm: Normal rate and regular rhythm.  Pulmonary:     Effort: Pulmonary effort is normal. No respiratory distress.     Breath sounds: Normal breath sounds.  Abdominal:     General: Abdomen is flat.     Palpations: Abdomen is soft.     Tenderness: There is no abdominal tenderness.  Musculoskeletal:     Right lower leg: No edema.     Left lower leg: No edema.  Skin:    General: Skin is warm and dry.     Capillary Refill: Capillary refill takes less than 2 seconds.  Neurological:     Comments: Responsive to painful stimuli but generally obtunded, moves all 4 extremities  Psychiatric:     Comments: Unable to assess     ED Results and Treatments Labs (all labs ordered are listed, but only abnormal results are displayed) Labs Reviewed  COMPREHENSIVE METABOLIC PANEL - Abnormal; Notable for the following components:      Result Value   Glucose, Bld 151 (*)    All other components within normal limits  CBC WITH DIFFERENTIAL/PLATELET - Abnormal; Notable for the following components:   Hemoglobin 12.4 (*)    HCT 37.8 (*)    All other components within normal limits  RAPID URINE DRUG SCREEN, HOSP PERFORMED - Abnormal; Notable for the  following components:   Benzodiazepines POSITIVE (*)    Amphetamines POSITIVE (*)    All other components within normal limits                                                                                                                          Radiology DG Chest Port 1 View Result Date: 03/27/2023 CLINICAL DATA:  overdose EXAM: PORTABLE CHEST - 1 VIEW COMPARISON:  09/23/2020. FINDINGS: Cardiac silhouette is unremarkable. No pneumothorax or pleural effusion. The lungs are clear. The visualized skeletal structures are unremarkable. IMPRESSION: No acute cardiopulmonary process. Electronically Signed   By: Layla Maw M.D.   On: 03/27/2023 20:31    Pertinent labs & imaging results that were available during my care of the patient were reviewed by me and considered in my medical decision making (see MDM for details).  Medications Ordered in ED Medications  naloxone (NARCAN) 0.4 MG/ML injection (has no administration in time range)  naloxone [facility]) injection 0.2 mg (0.2 mg Intravenous Given 03/27/23 1707)  sodium chloride 0.9 % bolus 1,000 mL (0 mLs Intravenous Stopped 03/27/23 1922)  naloxone [facility]) injection 0.2 mg (0.2 mg Intravenous Given 03/27/23 1709)  Procedures Procedures  (including critical care time)  Medical Decision Making / ED Course   MDM:  47 year old male presenting to the emergency department with suspected overdose.  He did have some response to Narcan, suspect opioid overdose.  Will monitor.  He is breathing spontaneously.  Will need to be sober before discharge and will need period of observation.  Lower concern for other causes of altered mental status such as intracranial injury, seizure, toxic or metabolic abnormality other than overdose.  Vital signs are reassuring.  Will continue to monitor closely.  Clinical Course as  of 03/27/23 2310  Thu Mar 27, 2023  2043 Patient more awake but still somnolent. Will continue to monitor [WS]  2216 Patient continues to be somnolent.  Will set him up in bed.  He does wake up but is oriented and gives normal responses.  Suspect he is still recovering from his overdose.  His UDS does not show any opiates, likely due to fentanyl. [WS]  2309 Signed out to Dr. Wilkie Aye  [WS]    Clinical Course User Index [WS] Suezanne Jacquet Jerilee Field, MD     Additional history obtained: -Additional history obtained from ems -External records from outside source obtained and reviewed including: Chart review including previous notes, labs, imaging, consultation notes including prior ER visit for overdose    Lab Tests: -I ordered, reviewed, and interpreted labs.   The pertinent results include:   Labs Reviewed  COMPREHENSIVE METABOLIC PANEL - Abnormal; Notable for the following components:      Result Value   Glucose, Bld 151 (*)    All other components within normal limits  CBC WITH DIFFERENTIAL/PLATELET - Abnormal; Notable for the following components:   Hemoglobin 12.4 (*)    HCT 37.8 (*)    All other components within normal limits  RAPID URINE DRUG SCREEN, HOSP PERFORMED - Abnormal; Notable for the following components:   Benzodiazepines POSITIVE (*)    Amphetamines POSITIVE (*)    All other components within normal limits    Notable for +uds   EKG   EKG Interpretation Date/Time:  Thursday March 27 2023 17:03:35 EST Ventricular Rate:  61 PR Interval:  138 QRS Duration:  94 QT Interval:  431 QTC Calculation: 435 R Axis:   42  Text Interpretation: Sinus rhythm Nonspecific T abnormalities, lateral leads Confirmed by Alvino Blood ([id number]) on 03/27/2023 5:17:20 PM         Imaging Studies ordered: I ordered imaging studies including CXR On my interpretation imaging demonstrates no acute process I independently visualized and interpreted imaging. I agree with the  radiologist interpretation   Medicines ordered and prescription drug management: Meds ordered this encounter  Medications   naloxone (NARCAN) 0.4 MG/ML injection    Wallace Cullens, Dara S: cabinet override   naloxone [facility]) injection 0.2 mg   sodium chloride 0.9 % bolus 1,000 mL   naloxone (NARCAN) injection 0.2 mg    -I have reviewed the patients home medicines and have made adjustments as needed    Cardiac Monitoring: The patient was maintained on a cardiac monitor.  I personally viewed and interpreted the cardiac monitored which showed an underlying rhythm of: sinus bradycardia   Social Determinants of Health:  Diagnosis or treatment significantly limited by social determinants of health: polysubstance abuse   Reevaluation: After the interventions noted above, I reevaluated the patient and found that their symptoms have improved  Co morbidities that complicate the patient evaluation  Past Medical History:  Diagnosis Date   Anxiety  History of hiatal hernia    Substance abuse (HCC)       Dispostion: Disposition decision including need for hospitalization was considered, and patient disposition pending at time of sign out.    Final Clinical Impression(s) / ED Diagnoses Final diagnoses:  Drug overdose of undetermined intent, initial encounter     This chart was dictated using voice recognition software.  Despite best efforts to proofread,  errors can occur which can change the documentation meaning.    Lonell Grandchild, MD 03/27/23 [phone]

## 2023-03-27 NOTE — ED Triage Notes (Signed)
Patient was found unresponsive by his roommates due to a drug overdose. Respirations improved with 2.5 of Narcan.

## 2023-03-27 NOTE — ED Notes (Signed)
Nurse provided pt with sprite and graham crackers, pt able to wake up when called by name. Pt responds vocally but does not open eyes unless encouraged to. Refreshments offered to pt once he was more alert, pt tolerated well.

## 2023-03-27 NOTE — ED Notes (Signed)
Pt still very lethargic and not staying awake and alert enough to ambulate. Will continue to try.

## 2023-03-28 LAB — CBG MONITORING, ED: Glucose-Capillary: 152 mg/dL — ABNORMAL HIGH (ref 70–99)

## 2023-03-28 NOTE — ED Provider Notes (Signed)
Patient signed out pending metabolism.  In brief presented as an opioid overdose.  1:40 AM   Patient observed ambulating independently in the hallway without dizziness.  He is able to tolerate p.o.  He is at his baseline.  Will plan for discharge.   Shon Baton, MD 03/28/23 [phone]

## 2023-03-28 NOTE — ED Notes (Signed)
Pt more alert, able to dress with multiple assistance, external catheter removed. Pt ambulated with nurse on standby in hallway, tolerated well. No c/o of dizziness. Sandwich and sprite provided upon return to room.

## 2023-03-28 NOTE — Discharge Instructions (Signed)
You were seen today after an opiate overdose.  As you know, this can be deadly.  You should avoid illicit drug use.  See resources provided.

## 2023-06-13 ENCOUNTER — Encounter (HOSPITAL_COMMUNITY): Payer: Self-pay | Admitting: Internal Medicine

## 2023-06-13 ENCOUNTER — Emergency Department (HOSPITAL_COMMUNITY): Payer: MEDICAID

## 2023-06-13 ENCOUNTER — Inpatient Hospital Stay (HOSPITAL_COMMUNITY)
Admission: EM | Admit: 2023-06-13 | Discharge: 2023-07-14 | DRG: 870 | Disposition: A | Discharge status: Home / Self Care | Payer: MEDICAID | Attending: Internal Medicine | Admitting: Internal Medicine

## 2023-06-13 ENCOUNTER — Other Ambulatory Visit: Payer: Self-pay

## 2023-06-13 DIAGNOSIS — A419 Sepsis, unspecified organism: Principal | ICD-10-CM | POA: Diagnosis present

## 2023-06-13 DIAGNOSIS — J9690 Respiratory failure, unspecified, unspecified whether with hypoxia or hypercapnia: Secondary | ICD-10-CM | POA: Diagnosis not present

## 2023-06-13 DIAGNOSIS — R590 Localized enlarged lymph nodes: Secondary | ICD-10-CM | POA: Insufficient documentation

## 2023-06-13 DIAGNOSIS — G928 Other toxic encephalopathy: Secondary | ICD-10-CM

## 2023-06-13 DIAGNOSIS — M4646 Discitis, unspecified, lumbar region: Secondary | ICD-10-CM | POA: Diagnosis present

## 2023-06-13 DIAGNOSIS — R4182 Altered mental status, unspecified: Principal | ICD-10-CM

## 2023-06-13 DIAGNOSIS — R6520 Severe sepsis without septic shock: Secondary | ICD-10-CM | POA: Diagnosis present

## 2023-06-13 DIAGNOSIS — G7281 Critical illness myopathy: Secondary | ICD-10-CM

## 2023-06-13 DIAGNOSIS — B9562 Methicillin resistant Staphylococcus aureus infection as the cause of diseases classified elsewhere: Principal | ICD-10-CM | POA: Diagnosis present

## 2023-06-13 DIAGNOSIS — M4640 Discitis, unspecified, site unspecified: Secondary | ICD-10-CM | POA: Diagnosis present

## 2023-06-13 DIAGNOSIS — J9601 Acute respiratory failure with hypoxia: Secondary | ICD-10-CM | POA: Diagnosis not present

## 2023-06-13 DIAGNOSIS — R7881 Bacteremia: Principal | ICD-10-CM | POA: Diagnosis present

## 2023-06-13 DIAGNOSIS — I82423 Acute embolism and thrombosis of iliac vein, bilateral: Secondary | ICD-10-CM | POA: Insufficient documentation

## 2023-06-13 DIAGNOSIS — I76 Septic arterial embolism: Secondary | ICD-10-CM | POA: Diagnosis present

## 2023-06-13 DIAGNOSIS — F1990 Other psychoactive substance use, unspecified, uncomplicated: Secondary | ICD-10-CM

## 2023-06-13 DIAGNOSIS — R000 Tachycardia, unspecified: Secondary | ICD-10-CM

## 2023-06-13 DIAGNOSIS — E440 Moderate protein-calorie malnutrition: Secondary | ICD-10-CM

## 2023-06-13 DIAGNOSIS — N179 Acute kidney failure, unspecified: Secondary | ICD-10-CM | POA: Diagnosis present

## 2023-06-13 DIAGNOSIS — F1210 Cannabis abuse, uncomplicated: Secondary | ICD-10-CM | POA: Diagnosis present

## 2023-06-13 DIAGNOSIS — J850 Gangrene and necrosis of lung: Secondary | ICD-10-CM | POA: Diagnosis present

## 2023-06-13 DIAGNOSIS — E871 Hypo-osmolality and hyponatremia: Secondary | ICD-10-CM | POA: Diagnosis present

## 2023-06-13 DIAGNOSIS — I5021 Acute systolic (congestive) heart failure: Secondary | ICD-10-CM | POA: Diagnosis present

## 2023-06-13 DIAGNOSIS — J95851 Ventilator associated pneumonia: Secondary | ICD-10-CM | POA: Diagnosis not present

## 2023-06-13 DIAGNOSIS — T361X5A Adverse effect of cephalosporins and other beta-lactam antibiotics, initial encounter: Secondary | ICD-10-CM | POA: Diagnosis present

## 2023-06-13 DIAGNOSIS — I2690 Septic pulmonary embolism without acute cor pulmonale: Secondary | ICD-10-CM | POA: Diagnosis present

## 2023-06-13 DIAGNOSIS — F1590 Other stimulant use, unspecified, uncomplicated: Secondary | ICD-10-CM | POA: Diagnosis present

## 2023-06-13 DIAGNOSIS — Z751 Person awaiting admission to adequate facility elsewhere: Secondary | ICD-10-CM

## 2023-06-13 DIAGNOSIS — K6812 Psoas muscle abscess: Secondary | ICD-10-CM | POA: Diagnosis present

## 2023-06-13 DIAGNOSIS — Y95 Nosocomial condition: Secondary | ICD-10-CM | POA: Diagnosis not present

## 2023-06-13 DIAGNOSIS — R64 Cachexia: Secondary | ICD-10-CM | POA: Diagnosis present

## 2023-06-13 DIAGNOSIS — A4102 Sepsis due to Methicillin resistant Staphylococcus aureus: Principal | ICD-10-CM | POA: Diagnosis present

## 2023-06-13 DIAGNOSIS — R490 Dysphonia: Secondary | ICD-10-CM | POA: Diagnosis present

## 2023-06-13 DIAGNOSIS — L270 Generalized skin eruption due to drugs and medicaments taken internally: Secondary | ICD-10-CM | POA: Diagnosis present

## 2023-06-13 DIAGNOSIS — M6282 Rhabdomyolysis: Secondary | ICD-10-CM | POA: Diagnosis present

## 2023-06-13 DIAGNOSIS — D72823 Leukemoid reaction: Secondary | ICD-10-CM | POA: Diagnosis present

## 2023-06-13 DIAGNOSIS — Z7151 Drug abuse counseling and surveillance of drug abuser: Secondary | ICD-10-CM

## 2023-06-13 DIAGNOSIS — Z880 Allergy status to penicillin: Secondary | ICD-10-CM

## 2023-06-13 DIAGNOSIS — Z6820 Body mass index (BMI) 20.0-20.9, adult: Secondary | ICD-10-CM

## 2023-06-13 DIAGNOSIS — E872 Acidosis, unspecified: Secondary | ICD-10-CM | POA: Diagnosis present

## 2023-06-13 DIAGNOSIS — I330 Acute and subacute infective endocarditis: Secondary | ICD-10-CM | POA: Diagnosis present

## 2023-06-13 DIAGNOSIS — D638 Anemia in other chronic diseases classified elsewhere: Secondary | ICD-10-CM | POA: Diagnosis present

## 2023-06-13 DIAGNOSIS — J15212 Pneumonia due to Methicillin resistant Staphylococcus aureus: Secondary | ICD-10-CM | POA: Diagnosis present

## 2023-06-13 DIAGNOSIS — D6489 Other specified anemias: Secondary | ICD-10-CM | POA: Diagnosis not present

## 2023-06-13 DIAGNOSIS — J80 Acute respiratory distress syndrome: Secondary | ICD-10-CM | POA: Diagnosis not present

## 2023-06-13 DIAGNOSIS — Z5971 Insufficient health insurance coverage: Secondary | ICD-10-CM

## 2023-06-13 DIAGNOSIS — R159 Full incontinence of feces: Secondary | ICD-10-CM | POA: Diagnosis not present

## 2023-06-13 DIAGNOSIS — R6521 Severe sepsis with septic shock: Secondary | ICD-10-CM | POA: Diagnosis present

## 2023-06-13 DIAGNOSIS — Z8619 Personal history of other infectious and parasitic diseases: Secondary | ICD-10-CM

## 2023-06-13 DIAGNOSIS — E876 Hypokalemia: Secondary | ICD-10-CM | POA: Diagnosis present

## 2023-06-13 DIAGNOSIS — D75838 Other thrombocytosis: Secondary | ICD-10-CM | POA: Diagnosis not present

## 2023-06-13 DIAGNOSIS — F419 Anxiety disorder, unspecified: Secondary | ICD-10-CM | POA: Diagnosis present

## 2023-06-13 DIAGNOSIS — F17210 Nicotine dependence, cigarettes, uncomplicated: Secondary | ICD-10-CM | POA: Diagnosis present

## 2023-06-13 DIAGNOSIS — I5082 Biventricular heart failure: Secondary | ICD-10-CM | POA: Diagnosis present

## 2023-06-13 DIAGNOSIS — W06XXXA Fall from bed, initial encounter: Secondary | ICD-10-CM | POA: Diagnosis not present

## 2023-06-13 DIAGNOSIS — Y92239 Unspecified place in hospital as the place of occurrence of the external cause: Secondary | ICD-10-CM | POA: Diagnosis not present

## 2023-06-13 DIAGNOSIS — R591 Generalized enlarged lymph nodes: Secondary | ICD-10-CM | POA: Diagnosis present

## 2023-06-13 DIAGNOSIS — E873 Alkalosis: Secondary | ICD-10-CM | POA: Diagnosis present

## 2023-06-13 DIAGNOSIS — J9383 Other pneumothorax: Secondary | ICD-10-CM | POA: Diagnosis not present

## 2023-06-13 DIAGNOSIS — S022XXA Fracture of nasal bones, initial encounter for closed fracture: Secondary | ICD-10-CM | POA: Diagnosis present

## 2023-06-13 DIAGNOSIS — Y848 Other medical procedures as the cause of abnormal reaction of the patient, or of later complication, without mention of misadventure at the time of the procedure: Secondary | ICD-10-CM | POA: Diagnosis not present

## 2023-06-13 DIAGNOSIS — Z881 Allergy status to other antibiotic agents status: Secondary | ICD-10-CM

## 2023-06-13 DIAGNOSIS — F1120 Opioid dependence, uncomplicated: Secondary | ICD-10-CM | POA: Diagnosis present

## 2023-06-13 DIAGNOSIS — Z5181 Encounter for therapeutic drug level monitoring: Secondary | ICD-10-CM

## 2023-06-13 DIAGNOSIS — Z79899 Other long term (current) drug therapy: Secondary | ICD-10-CM

## 2023-06-13 HISTORY — DX: Unspecified viral hepatitis C without hepatic coma: B19.20

## 2023-06-13 LAB — CSF CELL COUNT WITH DIFFERENTIAL
Lymphs, CSF: 2 % — ABNORMAL LOW (ref 40–80)
Monocyte-Macrophage-Spinal Fluid: 8 % — ABNORMAL LOW (ref 15–45)
Segmented Neutrophils-CSF: 90 % — ABNORMAL HIGH (ref 0–6)
Tube #: 1

## 2023-06-13 LAB — CBC WITH DIFFERENTIAL/PLATELET
Abs Immature Granulocytes: 1.09 10*3/uL — ABNORMAL HIGH (ref 0.00–0.07)
Basophils Absolute: 0 10*3/uL (ref 0.0–0.1)
Basophils Relative: 0 %
Eosinophils Absolute: 0 10*3/uL (ref 0.0–0.5)
Eosinophils Relative: 0 %
HCT: 50.7 % (ref 39.0–52.0)
Hemoglobin: 16.8 g/dL (ref 13.0–17.0)
Immature Granulocytes: 7 %
Lymphocytes Relative: 7 %
Lymphs Abs: 1.1 10*3/uL (ref 0.7–4.0)
MCH: 27.6 pg (ref 26.0–34.0)
MCHC: 33.1 g/dL (ref 30.0–36.0)
MCV: 83.4 fL (ref 80.0–100.0)
Monocytes Absolute: 0.9 10*3/uL (ref 0.1–1.0)
Monocytes Relative: 6 %
Neutro Abs: 12.1 10*3/uL — ABNORMAL HIGH (ref 1.7–7.7)
Neutrophils Relative %: 80 %
Platelets: 96 10*3/uL — ABNORMAL LOW (ref 150–400)
RBC: 6.08 MIL/uL — ABNORMAL HIGH (ref 4.22–5.81)
RDW: 16.5 % — ABNORMAL HIGH (ref 11.5–15.5)
WBC: 15.3 10*3/uL — ABNORMAL HIGH (ref 4.0–10.5)
nRBC: 0 % (ref 0.0–0.2)

## 2023-06-13 LAB — BLOOD GAS, ARTERIAL
Acid-base deficit: 0.8 mmol/L (ref 0.0–2.0)
Acid-base deficit: 4.3 mmol/L — ABNORMAL HIGH (ref 0.0–2.0)
Bicarbonate: 19.2 mmol/L — ABNORMAL LOW (ref 20.0–28.0)
Bicarbonate: 18.1 mmol/L — ABNORMAL LOW (ref 20.0–28.0)
Drawn by: 25770
Drawn by: 59133
O2 Saturation: 98.6 %
O2 Saturation: 98.8 %
Patient temperature: 41.2
Patient temperature: 38.3
pCO2 arterial: 25 mmHg — ABNORMAL LOW (ref 32–48)
pCO2 arterial: 28 mmHg — ABNORMAL LOW (ref 32–48)
pH, Arterial: 7.5 — ABNORMAL HIGH (ref 7.35–7.45)
pH, Arterial: 7.43 (ref 7.35–7.45)
pO2, Arterial: 103 mmHg (ref 83–108)
pO2, Arterial: 86 mmHg (ref 83–108)

## 2023-06-13 LAB — URINALYSIS, ROUTINE W REFLEX MICROSCOPIC
Bilirubin Urine: NEGATIVE
Glucose, UA: 150 mg/dL — AB
Ketones, ur: NEGATIVE mg/dL
Leukocytes,Ua: NEGATIVE
Nitrite: NEGATIVE
Protein, ur: >=300 mg/dL — AB
Specific Gravity, Urine: 1.028 (ref 1.005–1.030)
pH: 5 (ref 5.0–8.0)

## 2023-06-13 LAB — RAPID URINE DRUG SCREEN, HOSP PERFORMED
Amphetamines: NOT DETECTED
Barbiturates: NOT DETECTED
Benzodiazepines: NOT DETECTED
Cocaine: NOT DETECTED
Opiates: POSITIVE — AB
Tetrahydrocannabinol: POSITIVE — AB

## 2023-06-13 LAB — SALICYLATE LEVEL: Salicylate Lvl: <7 mg/dL — ABNORMAL LOW (ref 7.0–30.0)

## 2023-06-13 LAB — BLOOD GAS, VENOUS
Acid-base deficit: 5.3 mmol/L — ABNORMAL HIGH (ref 0.0–2.0)
Bicarbonate: 21.2 mmol/L (ref 20.0–28.0)
O2 Saturation: 19.6 %
Patient temperature: 37
pCO2, Ven: 44 mmHg (ref 44–60)
pH, Ven: 7.29 (ref 7.25–7.43)
pO2, Ven: <31 mmHg — CL (ref 32–45)

## 2023-06-13 LAB — COMPREHENSIVE METABOLIC PANEL WITH GFR
ALT: 35 U/L (ref 0–44)
AST: 119 U/L — ABNORMAL HIGH (ref 15–41)
Albumin: 3.1 g/dL — ABNORMAL LOW (ref 3.5–5.0)
Alkaline Phosphatase: 64 U/L (ref 38–126)
Anion gap: 21 — ABNORMAL HIGH (ref 5–15)
BUN: 56 mg/dL — ABNORMAL HIGH (ref 6–20)
CO2: 18 mmol/L — ABNORMAL LOW (ref 22–32)
Calcium: 7.7 mg/dL — ABNORMAL LOW (ref 8.9–10.3)
Chloride: 101 mmol/L (ref 98–111)
Creatinine, Ser: 3.34 mg/dL — ABNORMAL HIGH (ref 0.61–1.24)
GFR, Estimated: 22 mL/min — ABNORMAL LOW (ref >60)
Glucose, Bld: 204 mg/dL — ABNORMAL HIGH (ref 70–99)
Potassium: 2.9 mmol/L — ABNORMAL LOW (ref 3.5–5.1)
Sodium: 140 mmol/L (ref 135–145)
Total Bilirubin: 1.2 mg/dL (ref 0.0–1.2)
Total Protein: 7.3 g/dL (ref 6.5–8.1)

## 2023-06-13 LAB — I-STAT CG4 LACTIC ACID, ED
Lactic Acid, Venous: 8.4 mmol/L (ref 0.5–1.9)
Lactic Acid, Venous: 8 mmol/L (ref 0.5–1.9)

## 2023-06-13 LAB — BETA-HYDROXYBUTYRIC ACID: Beta-Hydroxybutyric Acid: 0.23 mmol/L (ref 0.05–0.27)

## 2023-06-13 LAB — RESP PANEL BY RT-PCR (RSV, FLU A&B, COVID)  RVPGX2
Influenza A by PCR: NEGATIVE
Influenza B by PCR: NEGATIVE
Resp Syncytial Virus by PCR: NEGATIVE
SARS Coronavirus 2 by RT PCR: NEGATIVE

## 2023-06-13 LAB — CBG MONITORING, ED: Glucose-Capillary: 188 mg/dL — ABNORMAL HIGH (ref 70–99)

## 2023-06-13 LAB — CK: Total CK: 6805 U/L — ABNORMAL HIGH (ref 49–397)

## 2023-06-13 LAB — TROPONIN I (HIGH SENSITIVITY)
Troponin I (High Sensitivity): 694 ng/L (ref <18)
Troponin I (High Sensitivity): 1286 ng/L (ref <18)
Troponin I (High Sensitivity): 1180 ng/L (ref <18)

## 2023-06-13 LAB — GLUCOSE, CAPILLARY: Glucose-Capillary: 134 mg/dL — ABNORMAL HIGH (ref 70–99)

## 2023-06-13 LAB — LACTIC ACID, PLASMA: Lactic Acid, Venous: 5.1 mmol/L (ref 0.5–1.9)

## 2023-06-13 LAB — PROTEIN AND GLUCOSE, CSF
Glucose, CSF: 87 mg/dL — ABNORMAL HIGH (ref 40–70)
Total  Protein, CSF: >600 mg/dL — ABNORMAL HIGH (ref 15–45)

## 2023-06-13 LAB — MAGNESIUM: Magnesium: 1.9 mg/dL (ref 1.7–2.4)

## 2023-06-13 LAB — ETHANOL: Alcohol, Ethyl (B): <10 mg/dL (ref <10)

## 2023-06-13 LAB — PROTIME-INR
INR: 1.3 — ABNORMAL HIGH (ref 0.8–1.2)
Prothrombin Time: 16.4 s — ABNORMAL HIGH (ref 11.4–15.2)

## 2023-06-13 LAB — HIV ANTIBODY (ROUTINE TESTING W REFLEX): HIV Screen 4th Generation wRfx: NONREACTIVE

## 2023-06-13 LAB — ACETAMINOPHEN LEVEL: Acetaminophen (Tylenol), Serum: <10 ug/mL — ABNORMAL LOW (ref 10–30)

## 2023-06-13 MED ORDER — FAMOTIDINE IN NACL 20-0.9 MG/50ML-% IV SOLN
20.0000 mg | Freq: Once | INTRAVENOUS | Status: AC
  Administered 2023-06-13: 20 mg via INTRAVENOUS
  Filled 2023-06-13: qty 50

## 2023-06-13 MED ORDER — INSULIN ASPART 100 UNIT/ML IJ SOLN
0.0000 [IU] | INTRAMUSCULAR | Status: DC
  Administered 2023-06-13 – 2023-06-14 (×4): 1 [IU] via SUBCUTANEOUS
  Administered 2023-06-15: 2 [IU] via SUBCUTANEOUS
  Administered 2023-06-16: 1 [IU] via SUBCUTANEOUS
  Administered 2023-06-16: 2 [IU] via SUBCUTANEOUS
  Administered 2023-06-16 – 2023-06-19 (×9): 1 [IU] via SUBCUTANEOUS
  Administered 2023-06-19: 2 [IU] via SUBCUTANEOUS
  Administered 2023-06-19 – 2023-06-20 (×4): 1 [IU] via SUBCUTANEOUS
  Administered 2023-06-20 – 2023-06-21 (×2): 2 [IU] via SUBCUTANEOUS
  Administered 2023-06-21 (×3): 1 [IU] via SUBCUTANEOUS
  Administered 2023-06-22: 2 [IU] via SUBCUTANEOUS
  Administered 2023-06-22 (×4): 1 [IU] via SUBCUTANEOUS
  Administered 2023-06-23: 2 [IU] via SUBCUTANEOUS
  Administered 2023-06-23 – 2023-06-25 (×11): 1 [IU] via SUBCUTANEOUS
  Administered 2023-06-26: 2 [IU] via SUBCUTANEOUS
  Administered 2023-06-26: 1 [IU] via SUBCUTANEOUS
  Administered 2023-06-26: 2 [IU] via SUBCUTANEOUS
  Administered 2023-06-26 (×4): 1 [IU] via SUBCUTANEOUS
  Administered 2023-06-27: 2 [IU] via SUBCUTANEOUS
  Administered 2023-06-27 (×2): 1 [IU] via SUBCUTANEOUS
  Administered 2023-06-27: 2 [IU] via SUBCUTANEOUS
  Administered 2023-06-28 – 2023-06-29 (×8): 1 [IU] via SUBCUTANEOUS
  Administered 2023-06-29: 2 [IU] via SUBCUTANEOUS
  Administered 2023-06-29 – 2023-06-30 (×3): 1 [IU] via SUBCUTANEOUS
  Administered 2023-06-30: 3 [IU] via SUBCUTANEOUS
  Administered 2023-06-30 – 2023-07-01 (×4): 1 [IU] via SUBCUTANEOUS

## 2023-06-13 MED ORDER — SODIUM CHLORIDE 0.9 % IV SOLN
2.0000 g | Freq: Two times a day (BID) | INTRAVENOUS | Status: DC
  Administered 2023-06-14: 2 g via INTRAVENOUS
  Filled 2023-06-13: qty 20

## 2023-06-13 MED ORDER — LACTATED RINGERS IV BOLUS
1000.0000 mL | Freq: Once | INTRAVENOUS | Status: AC
  Administered 2023-06-13: 1000 mL via INTRAVENOUS

## 2023-06-13 MED ORDER — METHYLPREDNISOLONE SODIUM SUCC 125 MG IJ SOLR
125.0000 mg | Freq: Once | INTRAMUSCULAR | Status: AC
  Administered 2023-06-13: 125 mg via INTRAVENOUS
  Filled 2023-06-13: qty 2

## 2023-06-13 MED ORDER — THIAMINE HCL 100 MG/ML IJ SOLN
100.0000 mg | Freq: Every day | INTRAMUSCULAR | Status: DC
  Administered 2023-06-13 – 2023-07-01 (×19): 100 mg via INTRAVENOUS
  Filled 2023-06-13 (×19): qty 2

## 2023-06-13 MED ORDER — VANCOMYCIN HCL 500 MG/100ML IV SOLN
500.0000 mg | Freq: Once | INTRAVENOUS | Status: DC
  Filled 2023-06-13: qty 100

## 2023-06-13 MED ORDER — FENTANYL CITRATE PF 50 MCG/ML IJ SOSY
25.0000 ug | PREFILLED_SYRINGE | Freq: Once | INTRAMUSCULAR | Status: AC
  Administered 2023-06-13: 25 ug via INTRAVENOUS
  Filled 2023-06-13: qty 1

## 2023-06-13 MED ORDER — ACETAMINOPHEN 650 MG RE SUPP
650.0000 mg | RECTAL | Status: DC | PRN
  Administered 2023-06-14 – 2023-06-15 (×3): 650 mg via RECTAL
  Filled 2023-06-13 (×4): qty 1

## 2023-06-13 MED ORDER — SODIUM CHLORIDE 0.9 % IV SOLN: 2.0000 g | INTRAVENOUS | Status: DC

## 2023-06-13 MED ORDER — MAGNESIUM SULFATE IN D5W 1-5 GM/100ML-% IV SOLN
1.0000 g | Freq: Once | INTRAVENOUS | Status: AC
  Administered 2023-06-13: 1 g via INTRAVENOUS
  Filled 2023-06-13: qty 100

## 2023-06-13 MED ORDER — POTASSIUM CHLORIDE 10 MEQ/100ML IV SOLN
10.0000 meq | INTRAVENOUS | Status: AC
  Administered 2023-06-13 – 2023-06-14 (×4): 10 meq via INTRAVENOUS
  Filled 2023-06-13 (×4): qty 100

## 2023-06-13 MED ORDER — ACETAMINOPHEN 650 MG RE SUPP
650.0000 mg | Freq: Once | RECTAL | Status: AC
  Administered 2023-06-13: 650 mg via RECTAL
  Filled 2023-06-13: qty 1

## 2023-06-13 MED ORDER — HEPARIN SODIUM (PORCINE) 5000 UNIT/ML IJ SOLN
5000.0000 [IU] | Freq: Three times a day (TID) | INTRAMUSCULAR | Status: DC
  Administered 2023-06-13 – 2023-06-21 (×23): 5000 [IU] via SUBCUTANEOUS
  Filled 2023-06-13 (×23): qty 1

## 2023-06-13 MED ORDER — SODIUM CHLORIDE 0.9 % IV SOLN
2.0000 g | Freq: Once | INTRAVENOUS | Status: AC
  Administered 2023-06-13: 2 g via INTRAVENOUS
  Filled 2023-06-13: qty 12.5

## 2023-06-13 MED ORDER — VANCOMYCIN HCL 500 MG IV SOLR
500.0000 mg | Freq: Once | INTRAVENOUS | Status: AC
  Administered 2023-06-13: 500 mg via INTRAVENOUS
  Filled 2023-06-13: qty 10

## 2023-06-13 MED ORDER — DIPHENHYDRAMINE HCL 50 MG/ML IJ SOLN
25.0000 mg | Freq: Once | INTRAMUSCULAR | Status: AC
  Administered 2023-06-13: 25 mg via INTRAVENOUS
  Filled 2023-06-13: qty 1

## 2023-06-13 MED ORDER — LACTATED RINGERS IV SOLN: INTRAVENOUS | Status: DC

## 2023-06-13 MED ORDER — VANCOMYCIN HCL IN DEXTROSE 1-5 GM/200ML-% IV SOLN
1000.0000 mg | Freq: Once | INTRAVENOUS | Status: AC
Start: 2023-06-13 — End: 2023-06-13
  Administered 2023-06-13: 1000 mg via INTRAVENOUS
  Filled 2023-06-13: qty 200

## 2023-06-13 MED ORDER — DEXMEDETOMIDINE HCL IN NACL 400 MCG/100ML IV SOLN
0.0000 ug/kg/h | INTRAVENOUS | Status: DC
Start: 2023-06-13 — End: 2023-06-17
  Administered 2023-06-13 (×2): 0.4 ug/kg/h via INTRAVENOUS
  Administered 2023-06-14: 0.6 ug/kg/h via INTRAVENOUS
  Administered 2023-06-14: 0.4 ug/kg/h via INTRAVENOUS
  Administered 2023-06-15: 0.5 ug/kg/h via INTRAVENOUS
  Administered 2023-06-15: 0.6 ug/kg/h via INTRAVENOUS
  Administered 2023-06-16: 0.4 ug/kg/h via INTRAVENOUS
  Administered 2023-06-17: 0.5 ug/kg/h via INTRAVENOUS
  Filled 2023-06-13 (×7): qty 100

## 2023-06-13 NOTE — ED Notes (Signed)
 Started applying [employer] to patient

## 2023-06-13 NOTE — ED Notes (Signed)
 ED TO INPATIENT HANDOFF REPORT  Name/Age/Gender Gracy Racer 48 y.o. male  Code Status    Code Status Orders  (From admission, onward)           Start     Ordered   06/13/23 1755  Full code  Continuous       Question:  By:  Answer:  Default: patient does not have capacity for decision making, no surrogate or prior directive available   06/13/23 1755           Code Status History     This patient has a current code status but no historical code status.       Home/SNF/Other Home  Chief Complaint Severe sepsis (HCC) [A41.9, R65.20]  Level of Care/Admitting Diagnosis ED Disposition     ED Disposition  Admit   Condition  --   Comment  H[facility]: [facility] [[id number]]  Level of Care: ICU [6]  May admit patient to [facility] or [facility] if equivalent level of care is available:: Yes  Covid Evaluation: Confirmed COVID Negative  Diagnosis: Severe sepsis [facility]) [[id number]]  Admitting Physician: Charlott Holler [[id number]]  Attending Physician: Charlott Holler [[id number]]  Certification:: I certify this patient will need inpatient services for at least 2 midnights  Expected Medical Readiness: 06/16/2023          Medical History Past Medical History:  Diagnosis Date   Hepatitis C     Allergies Allergies  Allergen Reactions   Penicillins Other (See Comments)    Noted on prior chart. Rash to cefepime 06/13/23, but tolerated ceftriaxone in 2020. Mother is unsure if he is truly allergic to penicillin. (2025)   Cefepime Rash    IV Location/Drains/Wounds Patient Lines/Drains/Airways Status     Active Line/Drains/Airways     Name Placement date Placement time Site Days   Peripheral IV 06/13/23 20 G Left;Posterior Hand 06/13/23  --  Hand  less than 1   Peripheral IV 06/13/23 18 G Anterior;Distal;Right;Upper Arm 06/13/23  1359  Arm  less than 1   Peripheral IV 06/13/23 20 G Posterior;Right Hand 06/13/23  1419  Hand  less than 1    Urethral Catheter Leatha Gilding K Paramedic Double-lumen;Latex;Straight-tip;Temperature probe 06/13/23  1440  Double-lumen;Latex;Straight-tip;Temperature probe  less than 1            Labs/Imaging Results for orders placed or performed during the hospital encounter of 06/13/23 (from the past 48 hours)  CBG monitoring, ED     Status: Abnormal   Collection Time: 06/13/23  1:22 PM  Result Value Ref Range   Glucose-Capillary 188 (H) 70 - 99 mg/dL    Comment: Glucose reference range applies only to samples taken after fasting for at least 8 hours.  CBC with Differential     Status: Abnormal   Collection Time: 06/13/23  1:45 PM  Result Value Ref Range   WBC 15.3 (H) 4.0 - 10.5 K/uL   RBC 6.08 (H) 4.22 - 5.81 MIL/uL   Hemoglobin 16.8 13.0 - 17.0 g/dL   HCT 84.1 66.0 - 63.0 %   MCV 83.4 80.0 - 100.0 fL   MCH 27.6 26.0 - 34.0 pg   MCHC 33.1 30.0 - 36.0 g/dL   RDW 16.0 (H) 10.9 - 32.3 %   Platelets 96 (L) 150 - 400 K/uL    Comment: SPECIMEN CHECKED FOR CLOTS REPEATED TO VERIFY PLATELET COUNT CONFIRMED BY SMEAR    nRBC 0.0 0.0 - 0.2 %   Neutrophils  Relative % 80 %   Neutro Abs 12.1 (H) 1.7 - 7.7 K/uL   Lymphocytes Relative 7 %   Lymphs Abs 1.1 0.7 - 4.0 K/uL   Monocytes Relative 6 %   Monocytes Absolute 0.9 0.1 - 1.0 K/uL   Eosinophils Relative 0 %   Eosinophils Absolute 0.0 0.0 - 0.5 K/uL   Basophils Relative 0 %   Basophils Absolute 0.0 0.0 - 0.1 K/uL   WBC Morphology VACUOLATED NEUTROPHILS    Immature Granulocytes 7 %   Abs Immature Granulocytes 1.09 (H) 0.00 - 0.07 K/uL    Comment: Performed at [facility], [address], [place], [place] [address]  Beta-hydroxybutyric acid     Status: None   Collection Time: 06/13/23  1:45 PM  Result Value Ref Range   Beta-Hydroxybutyric Acid 0.23 0.05 - 0.27 mmol/L    Comment: Performed at [facility], [address], [place], [place] [address]  Rapid urine drug screen (hospital performed)      Status: Abnormal   Collection Time: 06/13/23  1:46 PM  Result Value Ref Range   Opiates POSITIVE (A) NONE DETECTED   Cocaine NONE DETECTED NONE DETECTED   Benzodiazepines NONE DETECTED NONE DETECTED   Amphetamines NONE DETECTED NONE DETECTED   Tetrahydrocannabinol POSITIVE (A) NONE DETECTED   Barbiturates NONE DETECTED NONE DETECTED    Comment: (NOTE) DRUG SCREEN FOR MEDICAL PURPOSES ONLY.  IF CONFIRMATION IS NEEDED FOR ANY PURPOSE, NOTIFY LAB WITHIN 5 DAYS.  LOWEST DETECTABLE LIMITS FOR URINE DRUG SCREEN Drug Class                     Cutoff (ng/mL) Amphetamine and metabolites    1000 Barbiturate and metabolites    200 Benzodiazepine                 200 Opiates and metabolites        300 Cocaine and metabolites        300 THC                            50 Performed at [facility], [address], [place], [place] [address]   Urinalysis, Routine w reflex microscopic -Urine, Clean Catch     Status: Abnormal   Collection Time: 06/13/23  1:46 PM  Result Value Ref Range   Color, Urine AMBER (A) YELLOW    Comment: BIOCHEMICALS MAY BE AFFECTED BY COLOR   APPearance CLOUDY (A) CLEAR   Specific Gravity, Urine 1.028 1.005 - 1.030   pH 5.0 5.0 - 8.0   Glucose, UA 150 (A) NEGATIVE mg/dL   Hgb urine dipstick LARGE (A) NEGATIVE   Bilirubin Urine NEGATIVE NEGATIVE   Ketones, ur NEGATIVE NEGATIVE mg/dL   Protein, ur >=914 (A) NEGATIVE mg/dL   Nitrite NEGATIVE NEGATIVE   Leukocytes,Ua NEGATIVE NEGATIVE   RBC / HPF 0-5 0 - 5 RBC/hpf   WBC, UA 6-10 0 - 5 WBC/hpf   Bacteria, UA MANY (A) NONE SEEN   Squamous Epithelial / HPF 0-5 0 - 5 /HPF   Mucus PRESENT    Granular Casts, UA PRESENT     Comment: Performed at [facility], [address], [place], [place] [address]  I-Stat CG4 Lactic Acid     Status: Abnormal   Collection Time: 06/13/23  1:49 PM  Result Value Ref Range   Lactic Acid, Venous 8.4 (HH) 0.5 - 1.9  mmol/L   Comment NOTIFIED PHYSICIAN    Blood gas, venous     Status: Abnormal   Collection Time: 06/13/23  1:55 PM  Result Value Ref Range   pH, Ven 7.29 7.25 - 7.43   pCO2, Ven 44 44 - 60 mmHg   pO2, Ven <31 (LL) 32 - 45 mmHg    Comment: CRITICAL RESULT CALLED TO, READ BACK BY AND VERIFIED WITH: JOHNSON,L. RN AT 1411 06/13/23 MULLINS,T    Bicarbonate 21.2 20.0 - 28.0 mmol/L   Acid-base deficit 5.3 (H) 0.0 - 2.0 mmol/L   O2 Saturation 19.6 %   Patient temperature 37.0     Comment: Performed at [facility], [address], [place], [place] [address]  Resp panel by RT-PCR (RSV, Flu A&B, Covid) Anterior Nasal Swab     Status: None   Collection Time: 06/13/23  1:56 PM   Specimen: Anterior Nasal Swab  Result Value Ref Range   SARS Coronavirus 2 by RT PCR NEGATIVE NEGATIVE    Comment: (NOTE) SARS-CoV-2 target nucleic acids are NOT DETECTED.  The SARS-CoV-2 RNA is generally detectable in upper respiratory specimens during the acute phase of infection. The lowest concentration of SARS-CoV-2 viral copies this assay can detect is 138 copies/mL. A negative result does not preclude SARS-Cov-2 infection and should not be used as the sole basis for treatment or other patient management decisions. A negative result may occur with  improper specimen collection/handling, submission of specimen other than nasopharyngeal swab, presence of viral mutation(s) within the areas targeted by this assay, and inadequate number of viral copies(<138 copies/mL). A negative result must be combined with clinical observations, patient history, and epidemiological information. The expected result is Negative.  Fact Sheet for Patients:  BloggerCourse.com  Fact Sheet for Healthcare Providers:  SeriousBroker.it  This test is no t yet approved or cleared by the [place] FDA and  has been authorized for detection and/or diagnosis of SARS-CoV-2 by FDA under an Emergency Use  Authorization (EUA). This EUA will remain  in effect (meaning this test can be used) for the duration of the COVID-19 declaration under Section 564(b)(1) of the Act, 21 U.S.C.section 360bbb-3(b)(1), unless the authorization is terminated  or revoked sooner.       Influenza A by PCR NEGATIVE NEGATIVE   Influenza B by PCR NEGATIVE NEGATIVE    Comment: (NOTE) The Xpert Xpress SARS-CoV-2/FLU/RSV plus assay is intended as an aid in the diagnosis of influenza from Nasopharyngeal swab specimens and should not be used as a sole basis for treatment. Nasal washings and aspirates are unacceptable for Xpert Xpress SARS-CoV-2/FLU/RSV testing.  Fact Sheet for Patients: BloggerCourse.com  Fact Sheet for Healthcare Providers: SeriousBroker.it  This test is not yet approved or cleared by the [place] FDA and has been authorized for detection and/or diagnosis of SARS-CoV-2 by FDA under an Emergency Use Authorization (EUA). This EUA will remain in effect (meaning this test can be used) for the duration of the COVID-19 declaration under Section 564(b)(1) of the Act, 21 U.S.C. section 360bbb-3(b)(1), unless the authorization is terminated or revoked.     Resp Syncytial Virus by PCR NEGATIVE NEGATIVE    Comment: (NOTE) Fact Sheet for Patients: BloggerCourse.com  Fact Sheet for Healthcare Providers: SeriousBroker.it  This test is not yet approved or cleared by the [place] FDA and has been authorized for detection and/or diagnosis of SARS-CoV-2 by FDA under an Emergency Use Authorization (EUA). This EUA will remain in effect (meaning this test can  be used) for the duration of the COVID-19 declaration under Section 564(b)(1) of the Act, 21 U.S.C. section 360bbb-3(b)(1), unless the authorization is terminated or revoked.  Performed at [facility], [address] [place], [place] [address]   Troponin I (High Sensitivity)     Status: Abnormal   Collection Time: 06/13/23  4:52 PM  Result Value Ref Range   Troponin I (High Sensitivity) 694 (HH) <18 ng/L    Comment: CRITICAL RESULT CALLED TO, READ BACK BY AND VERIFIED WITH CALLED LIV[place]ON, K [facility] AT 1743 06/13/23 TUCKER, J (NOTE) Elevated high sensitivity troponin I (hsTnI) values and significant  changes across serial measurements may suggest ACS but many other  chronic and acute conditions are known to elevate hsTnI results.  Refer to the "Links" section for chest pain algorithms and additional  guidance. Performed at [facility], [address], [place], [place] [address]   Protime-INR     Status: Abnormal   Collection Time: 06/13/23  4:52 PM  Result Value Ref Range   Prothrombin Time 16.4 (H) 11.4 - 15.2 seconds   INR 1.3 (H) 0.8 - 1.2    Comment: (NOTE) INR goal varies based on device and disease states. Performed at [facility], [address], [place], [place] [address]   I-Stat CG4 Lactic Acid     Status: Abnormal   Collection Time: 06/13/23  5:04 PM  Result Value Ref Range   Lactic Acid, Venous 8.0 (HH) 0.5 - 1.9 mmol/L   Comment NOTIFIED PHYSICIAN    DG Chest Portable 1 View Result Date: 06/13/2023 CLINICAL DATA:  Shortness of breath. EXAM: PORTABLE CHEST 1 VIEW COMPARISON:  Chest radiograph 03/27/2023 FINDINGS: The heart is normal in size. Stable mediastinal contours. Mild ill-defined hazy perihilar opacity. Question of peripheral left lung base opacity versus overlying artifact related to monitoring leads. Calcified granuloma in the left lung. No pneumothorax or large pleural effusion. On limited assessment, no acute osseous findings. IMPRESSION: 1. Mild ill-defined hazy perihilar opacity, may represent edema or atypical infection. 2. Question of peripheral left lung base opacity versus overlying artifact related to monitoring leads.  Electronically Signed   By: Narda Rutherford M.D.   On: 06/13/2023 17:25    Pending Labs Unresulted Labs (From admission, onward)     Start     Ordered   06/14/23 0500  CBC  Tomorrow morning,   R        06/13/23 1755   06/14/23 0500  Basic metabolic panel  Tomorrow morning,   R        06/13/23 1755   06/14/23 0500  Magnesium  Tomorrow morning,   R        06/13/23 1755   06/14/23 0500  Phosphorus  Tomorrow morning,   R        06/13/23 1755   06/14/23 0500  Hemoglobin A1c  Tomorrow morning,   R        06/13/23 1808   06/13/23 2000  Lactic acid, plasma  (Lactic Acid)  Once-Timed,   TIMED        06/13/23 1805   06/13/23 1756  Blood gas, arterial  Once,   R        06/13/23 1755   06/13/23 1754  HIV Antibody (routine testing w rflx)  (HIV Antibody (Routine testing w reflex) panel)  Once,   R        06/13/23 1755   06/13/23 1724  Acetaminophen level  Add-on,   AD        06/13/23 1723   06/13/23 1621  Comprehensive metabolic panel  ONCE - STAT,   STAT        06/13/23 1620   06/13/23 1600  CK  Once,   R        06/13/23 1600   06/13/23 1557  Ethanol  Add-on,   AD        06/13/23 1557   06/13/23 1518  Magnesium  Add-on,   AD        06/13/23 1517   06/13/23 1427  Urine Culture  Once,   URGENT       Question:  Indication  Answer:  Sepsis   06/13/23 1427   06/13/23 1415  Salicylate level  Once,   STAT        06/13/23 1415   06/13/23 1400  Culture, blood (Routine X 2) w Reflex to ID Panel  Once,   R        06/13/23 1400   06/13/23 1329  Blood culture (routine x 2)  BLOOD CULTURE X 2,   R (with STAT occurrences)      06/13/23 1328            Vitals/Pain Today's Vitals   06/13/23 1730 06/13/23 1735 06/13/23 1745 06/13/23 1800  BP: (!) 172/103  (!) 169/108 (!) 163/113  Pulse: (!) 122  (!) 125 60  Resp: (!) 30  (!) 43 (!) 51  Temp: (!) 105.6 F (40.9 C)  (!) 105.9 F (41.1 C) (!) 106.1 F (41.2 C)  TempSrc:      SpO2: 97%  91% (!) 79%  Weight:      PainSc:  0-No pain       Isolation Precautions No active isolations  Medications Medications  acetaminophen (TYLENOL) suppository 650 mg (has no administration in time range)  heparin injection 5,000 Units (has no administration in time range)  famotidine (PEPCID) IVPB 20 mg premix (20 mg Intravenous New Bag/Given 06/13/23 1807)  cefTRIAXone (ROCEPHIN) 2 g in sodium chloride 0.9 % 100 mL IVPB (has no administration in time range)  thiamine (VITAMIN B1) injection 100 mg (has no administration in time range)  dexmedetomidine (PRECEDEX) 400 MCG/100ML (4 mcg/mL) infusion (has no administration in time range)  lactated ringers bolus 1,000 mL (0 mLs Intravenous Stopped 06/13/23 1450)  ceFEPIme (MAXIPIME) 2 g in sodium chloride 0.9 % 100 mL IVPB (0 g Intravenous Stopped 06/13/23 1449)  lactated ringers bolus 1,000 mL (0 mLs Intravenous Stopped 06/13/23 1600)  vancomycin (VANCOCIN) IVPB 1000 mg/200 mL premix (0 mg Intravenous Stopped 06/13/23 1527)  vancomycin (VANCOCIN) 500 mg in sodium chloride 0.9 % 100 mL IVPB (0 mg Intravenous Stopped 06/13/23 1627)  acetaminophen (TYLENOL) suppository 650 mg (650 mg Rectal Given 06/13/23 1735)  diphenhydrAMINE (BENADRYL) injection 25 mg (25 mg Intravenous Given 06/13/23 1810)  methylPREDNISolone sodium succinate (SOLU-MEDROL) 125 mg/2 mL injection 125 mg (125 mg Intravenous Given 06/13/23 1809)  lactated ringers bolus 1,000 mL (1,000 mLs Intravenous New Bag/Given 06/13/23 1813)    Mobility Walks

## 2023-06-13 NOTE — Procedures (Signed)
 Lumbar Puncture Procedure Note  Dov Dill  [id number]  1975/05/18  Date:06/13/23  Time:7:02 PM   Provider Performing:Dejanira Pamintuan Kathie Rhodes Celine Mans   Procedure: Lumbar Puncture (42595)  Indication(s) Rule out meningitis  Consent Risks of the procedure as well as the alternatives and risks of each were explained to the patient and/or caregiver.  Consent for the procedure was obtained and is signed in the bedside chart  Anesthesia Topical only with 1% lidocaine    Time Out Verified patient identification, verified procedure, site/side was marked, verified correct patient position, special equipment/implants available, medications/allergies/relevant history reviewed, required imaging and test results available.   Sterile Technique Maximal sterile technique including sterile barrier drape, hand hygiene, sterile gown, sterile gloves, mask, hair covering.    Procedure Description Using palpation, approximate location of L3-L4 space identified.   Lidocaine used to anesthetize skin and subcutaneous tissue overlying this area.  A 20g spinal needle was then used to access the subarachnoid space. Opening pressure: 9cm H2O. Closing pressure:Not obtained. 3.5 CSF obtained. Initially fluid was clear and then became bloody from trauma  Complications/Tolerance None; patient tolerated the procedure well.   EBL Minimal   Specimen(s) CSF  Durel Salts, MD Pulmonary and Critical Care Medicine [facility] [place] 06/13/2023 7:04 PM Pager: see AMION  If no response to pager, please call critical care on call (see AMION) until 7pm After 7:00 pm call Elink

## 2023-06-13 NOTE — Progress Notes (Signed)
 Mother, Sonny Dandy [phone] or [phone], updated by phone.  Lives in [place], asked to be updated if any changes.        Posey Boyer, MSN, AG-ACNP-BC [facility] 06/13/2023, 7:19 PM  See Amion for pager If no response to pager , please call [phone] until 7pm After 7:00 pm call Elink  [phone]

## 2023-06-13 NOTE — ED Notes (Signed)
 Patient has a rash noted on his body Intensive.

## 2023-06-13 NOTE — ED Provider Notes (Signed)
 [place] EMERGENCY DEPARTMENT AT [facility] Provider Note   CSN: [id number] Arrival date & time: 06/13/23  1318     History Chief Complaint  Patient presents with   Altered Mental Status   Shortness of Breath    HPI  Brendan Preston is a 48 y.o. male presenting for altered mental status. Mother called and stated that he has been confused from approximately 4 days per his roommate. History of multisubstance overdoses in the past.  Allegedly Klonopin 4 days ago though has been too confused to take drugs in the interim.    Patient's recorded medical, surgical, social, medication list and allergies were reviewed in the Snapshot window as of the initial history.   Review of Systems   Review of Systems  Unable to perform ROS: Acuity of condition    Physical Exam Updated Vital Signs BP (!) 148/95   Pulse (!) 171   Temp (!) 104 F (40 C)   Resp (!) 27   Wt 66.7 kg   SpO2 (!) 89%  Physical Exam Vitals and nursing note reviewed.  Constitutional:      General: He is not in acute distress.    Appearance: He is well-developed.  HENT:     Head: Normocephalic and atraumatic.  Eyes:     Conjunctiva/sclera: Conjunctivae normal.  Cardiovascular:     Rate and Rhythm: Normal rate and regular rhythm.     Heart sounds: No murmur heard. Pulmonary:     Effort: Pulmonary effort is normal. Tachypnea present. No respiratory distress.     Breath sounds: Normal breath sounds.  Abdominal:     Palpations: Abdomen is soft.     Tenderness: There is no abdominal tenderness.  Musculoskeletal:        General: No swelling.     Cervical back: Neck supple.  Skin:    General: Skin is warm and dry.     Capillary Refill: Capillary refill takes less than 2 seconds.  Neurological:     Mental Status: He is alert.  Psychiatric:        Mood and Affect: Mood normal.      ED Course/ Medical Decision Making/ A&P    Procedures .Critical Care  Performed by: Glyn Ade,  MD Authorized by: Glyn Ade, MD   Critical care provider statement:    Critical care time (minutes):  90   Critical care was necessary to treat or prevent imminent or life-threatening deterioration of the following conditions:  CNS failure or compromise, sepsis, shock and dehydration   Critical care was time spent personally by me on the following activities:  Development of treatment plan with patient or surrogate, discussions with consultants, evaluation of patient's response to treatment, examination of patient, ordering and review of laboratory studies, ordering and review of radiographic studies, ordering and performing treatments and interventions, pulse oximetry, re-evaluation of patient's condition and review of old charts    Medications Ordered in ED Medications  lactated ringers bolus 1,000 mL (0 mLs Intravenous Stopped 06/13/23 1450)  ceFEPIme (MAXIPIME) 2 g in sodium chloride 0.9 % 100 mL IVPB (0 g Intravenous Stopped 06/13/23 1449)  lactated ringers bolus 1,000 mL (1,000 mLs Intravenous New Bag/Given 06/13/23 1404)  vancomycin (VANCOCIN) IVPB 1000 mg/200 mL premix (0 mg Intravenous Stopped 06/13/23 1527)  vancomycin (VANCOCIN) 500 mg in sodium chloride 0.9 % 100 mL IVPB (500 mg Intravenous New Bag/Given 06/13/23 1527)   Medical Decision Making:   Brendan Preston is a 48 y.o. male who presented  to the ED today with multiple symptoms detailed above.    Handoff received from EMS.  Additional history discussed with patient's family/caregivers.  Complete initial physical exam performed, notably the patient  was ill-appearing. During this initial exam, patient met criteria for activation of code sepsis due to presence of the following SIRS criteria as well as suspected infectious etiology:tachypnea, tachycardia, fever, triage CBC with leukocytosis. Reviewed and confirmed nursing documentation for past medical history, family history, social history.    Initial Assessment:   With the  patient's presentation of signs and symptoms of sepsis, most likely diagnosis is bacteremia secondary to underlying infection.  Considerations for source were initiated including:Urinary tract infections, abdominal infections such as cholecystitis/cholangitis/appendicitis, pulmonary etiology, bacteremia, skin etiology such as cellulitis or fasciitis, neurologic etiology such as meningitis or encephalitis.  This is most consistent with an acute life/limb threatening illness complicated by underlying chronic conditions.  Initial Plan:  Activated hospital protocol code sepsis including blood cultures, lactic acid screening, and further diagnostic care and management. Therapeutically, resuscitation fluids were considered. Patient has no contraindication to fluid resuscitation and therefore 30 cc of IV fluids per kilogram were administered Therapeutically, antibiotics were administered on a broad-spectrum nature. Undifferentiated source: Vancomycin and cefepime were administered to cover gram-positive and gram-negative high risk infections  Screening labs including CBC and Metabolic panel to evaluate for infectious or metabolic etiology of disease.  Urinalysis with reflex culture ordered to evaluate for UTI or relevant urologic/nephrologic pathology.  CXR to evaluate for structural/infectious intrathoracic pathology.  EKG to evaluate for cardiac pathology [facility] for AMS evaluations Objective evaluation as below reviewed   Initial Study Results:   Laboratory  Substantial lactic acid elevation, leukocytosis   EKG EKG was reviewed independently. Rate, rhythm, axis, intervals all examined and without medically relevant abnormality. ST segments without concerns for elevations.    Radiology:  Pending final reads at time of handoff    Consults: Case discussed with critical care medicine who will come and evaluate at bedside.   Final Assessment and Plan:   Patient to be admitted to critical care  medicine.  Critically ill at this time.  Patient's mother was updated given that she is listed as his point of contact on the chart and patient is unresponsive. Reviewed CT head without evidence of large intracranial bleed though formal radiology report is pending.  Uncertain underlying source of patient's infection.  Given history of IV drug use, systemic sepsis is favored is most likely.  Disposition:   Based on the above findings, I believe this patient is stable for admission.    Patient/family educated about specific findings on our evaluation and explained exact reasons for admission.  Patient/family educated about clinical situation and time was allowed to answer questions.   Admission team communicated with and agreed with need for admission. Patient admitted. Patient ready to move at this time.     Emergency Department Medication Summary:   Medications  lactated ringers bolus 1,000 mL (0 mLs Intravenous Stopped 06/13/23 1450)  ceFEPIme (MAXIPIME) 2 g in sodium chloride 0.9 % 100 mL IVPB (0 g Intravenous Stopped 06/13/23 1449)  lactated ringers bolus 1,000 mL (1,000 mLs Intravenous New Bag/Given 06/13/23 1404)  vancomycin (VANCOCIN) IVPB 1000 mg/200 mL premix (0 mg Intravenous Stopped 06/13/23 1527)  vancomycin (VANCOCIN) 500 mg in sodium chloride 0.9 % 100 mL IVPB (500 mg Intravenous New Bag/Given 06/13/23 1527)        Clinical Impression: No diagnosis found.   Data Unavailable   Final  Clinical Impression(s) / ED Diagnoses Final diagnoses:  None    Rx / DC Orders ED Discharge Orders     None         Glyn Ade, MD 06/13/23 1642

## 2023-06-13 NOTE — ED Triage Notes (Signed)
 Patient comes in bib found unresponsive. Tachypnea noted. Cold to touch. Diarrhea noted. Started coming around in triage however he is not oriented.

## 2023-06-13 NOTE — ED Notes (Signed)
 Provider messaged this patient about labs that hemolyzed will redraw labs.

## 2023-06-13 NOTE — Sepsis Progress Note (Signed)
 eLink is following this Code Sepsis.

## 2023-06-13 NOTE — ED Notes (Signed)
 Latic 8.37

## 2023-06-13 NOTE — Progress Notes (Signed)
 ABG collected and send down to lab for analysis. Lab notified.

## 2023-06-13 NOTE — Progress Notes (Signed)
 Pharmacy Antibiotic Note  Brendan Preston is a 48 y.o. male admitted on 06/13/2023 with sepsis, after being found down at home. Pharmacy has been consulted for vancomycin dosing.  AKI noted on admission Received vancomycin 1000 mg + 500 mg x 1 in ED (22 mg/kg load)  Plan: Anticipate significant changes in SCr given clinical situation - will dose vancomycin based on levels for now Given concern for possible CNS infection, will check random vancomycin level tomorrow AM (rather than waiting 24 hr) - redose to keep troughs above 15 mcg/ml until CNS ruled out Rocephin per MD; will increase to CNS dosing for now; can reduce dose if ruled out Possible rash noted to Cefepime; per CCM, pt tolerated Rocephin in the past  Weight: 66.7 kg (147 lb)  Temp (24hrs), Avg:104.8 F (40.4 C), Min:104 F (40 C), Max:106.1 F (41.2 C)  Recent Labs  Lab 06/13/23 1345 06/13/23 1349 06/13/23 1704  WBC 15.3*  --   --   LATICACIDVEN  --  8.4* 8.0*    CrCl cannot be calculated (No successful lab value found.).    Allergies  Allergen Reactions   Penicillins Other (See Comments)    Noted on prior chart. Rash to cefepime 06/13/23, but tolerated ceftriaxone in 2020. Mother is unsure if he is truly allergic to penicillin. (2025)   Cefepime Rash    Antimicrobials this admission: 3/7 vancomycin >>  3/7 cefepime >> 3/7 Rocephin 2q12 >>   Dose adjustments this admission: N/a  Microbiology results: 3/7 BCx: sent 3/7 UCx: sent    Thank you for allowing pharmacy to be a part of this patient's care.  Brendan Preston A 06/13/2023 6:38 PM

## 2023-06-13 NOTE — Progress Notes (Signed)
 ED Pharmacy Antibiotic Sign Off An antibiotic consult was received from an ED provider for vancomycin per pharmacy dosing for sepsis. A chart review was completed to assess appropriateness.   The following one time order(s) were placed:  Vancomycin 1000 mg IV and vancomycin 500 mg IV to equal a total dose of vancomycin 1500 mg IV  Further antibiotic and/or antibiotic pharmacy consults should be ordered by the admitting provider if indicated.   Thank you for allowing pharmacy to be a part of this patient's care.   Lynden Ang, [facility]  Clinical Pharmacist 06/13/23 1:55 PM

## 2023-06-13 NOTE — ED Notes (Signed)
 Intensivist at bedside.

## 2023-06-13 NOTE — H&P (Signed)
 NAME:  Brendan Preston, MRN:  [id number], DOB:  Feb 09, 1976, LOS: 0 ADMISSION DATE:  06/13/2023, CONSULTATION DATE:  06/13/23 REFERRING MD:  Dr. Doran Durand, CHIEF COMPLAINT:  AMS   History of Present Illness:  Pt encephalopathic, therefore HPI obtained from chart review.  See additional chart w/MRN [id number]  54 yoM with prior hx of known polysubstance (heroin/ fentanyl) abuse in which EMS was called out for AMS, last seen awake 3 days ago covered in urine and feces.   In ER, temp 104.3 rectal, tachycardic, normo to slightly hypertensive, sats 100% on NRB> but maintaining sats on RA with GCS ~10 but protecting airway.  Labs significant for lactic 8.4, WBC 15, plts 96, VBG 7.29/ 44,  CXR neg, RVP neg, EKG without ST changes, qtc borderline, CTH official read pending, trop hs 694, INR 1.3.  Foley placed- urine cloudy, glucose 150, Hgb 150 (-RBC), neg leuks/nitrates, WBC 6-10, UDS positive for THC and opiates.  CMET and other labs had to be redrawn and still awaiting due to hemolysis.  Cultures sent, empiric vanc/ cefepime given, 2L LR.  PCCM called for admit.  Pertinent  Medical History  Tobacco abuse, polysubstance abuse/ IVDA, ETOH unclear  Significant Hospital Events: Including procedures, antibiotic start and stop dates in addition to other pertinent events   3/7 admit  Interim History / Subjective:  Noted to have developed diffuse hives since arriving in ER- only received cefepime, vanc, and LR  Objective   Blood pressure (!) 148/95, pulse (!) 171, temperature (!) 104 F (40 C), resp. rate (!) 27, weight 66.7 kg, SpO2 (!) 89%.        Intake/Output Summary (Last 24 hours) at 06/13/2023 1504 Last data filed at 06/13/2023 1451 Gross per 24 hour  Intake 892.55 ml  Output --  Net 892.55 ml   Filed Weights   06/13/23 1420  Weight: 66.7 kg    Examination: General:  Thin and ill appearing adult male lying in bed HEENT: MM pink/dry, pupils 4/r, anicteric, no nuchal rigidity  Neuro:  will  open eyes, follows intermittent commands but moves all extremities spont CV: rr, ST, no obvious murmur PULM:  tachypneic, coarse on left, clear right, no wheeze, currently on RA GI: soft, bs +, mild grunting with palpation but no obvious guarding Extremities: warm/dry, no LE edema  Skin: diffuse urticaria        Resolved Hospital Problem list    Assessment & Plan:   Severe sepsis- source unclear Acute encephalopathy, suspected metabolic/ toxic  Hx polysubstance abuse/ IVDA, tobacco abuse, ?ETOH - positive for opiates/ THC Hyperglycemia Rash- suspected related to cefepime, hx of PCN allergy on prior chart P:  - admit to ICU for close monitoring, airway watch and hemodynamics, goal MAP > 65 - NPO - repeat lactic 8 after 2L IVF.  Bedside POCUS showing collapsible IVC.  Additional cool LR bolus now and continue to reassess.   - follow cultures/ trend CBC - check ABG - cont vanc, and ceftriaxone for now (previously tolerated ctx on previous admit for cellulitis) - trend troponin.  Initial EKG without acute ST changes, QRS wnl, QTC borderline, likely 2/2 stress demand.  Will check echo - LP for for CNS workup, consider EEG.  Pending CMET, add acyclovir  - serial neuro exams - cont foley, strict I/Os, trend renal indices  - CBG q4, prn SSI.  Beta-hydroxybutyric neg.  Check A1c in am - empiric thiamine IV, add folate/ MVI when able - monitor for withdrawal syndromes.  No  shivering, sweating, rigidity, diarrhea, or tremors notes concerning for serotonin syndrome at this time - tylenol PR prn pending LFTs, cooling blanket/ ice packs  - benadryl, pepcid and solumedrol x 1, reassess prn.  Suspect related to cefepime - awaiting CK, CMET, HIV, ethanol, salicylate/ acetaminophen, mag, phos pending    Best Practice (right click and "Reselect all SmartList Selections" daily)   Diet/type: NPO DVT prophylaxis prophylactic heparin  Pressure ulcer(s): N/A GI prophylaxis: N/A Lines:  N/A Foley:  Yes, and it is still needed Code Status:  full code Last date of multidisciplinary goals of care discussion [pending]  Labs   CBC: Recent Labs  Lab 06/13/23 1345  WBC 15.3*  NEUTROABS 12.1*  HGB 16.8  HCT 50.7  MCV 83.4  PLT 96*    Basic Metabolic Panel: No results for input(s): "NA", "K", "CL", "CO2", "GLUCOSE", "BUN", "CREATININE", "CALCIUM", "MG", "PHOS" in the last 168 hours. GFR: CrCl cannot be calculated (No successful lab value found.). Recent Labs  Lab 06/13/23 1345 06/13/23 1349  WBC 15.3*  --   LATICACIDVEN  --  8.4*    Liver Function Tests: No results for input(s): "AST", "ALT", "ALKPHOS", "BILITOT", "PROT", "ALBUMIN" in the last 168 hours. No results for input(s): "LIPASE", "AMYLASE" in the last 168 hours. No results for input(s): "AMMONIA" in the last 168 hours.  ABG    Component Value Date/Time   HCO3 21.2 06/13/2023 1355   ACIDBASEDEF 5.3 (H) 06/13/2023 1355   O2SAT 19.6 06/13/2023 1355     Coagulation Profile: No results for input(s): "INR", "PROTIME" in the last 168 hours.  Cardiac Enzymes: No results for input(s): "CKTOTAL", "CKMB", "CKMBINDEX", "TROPONINI" in the last 168 hours.  HbA1C: No results found for: "HGBA1C"  CBG: Recent Labs  Lab 06/13/23 1322  GLUCAP 188*    Review of Systems:   unable  Past Medical History:  Polysubstance abuse  Surgical History:  unable  Social History:   unable  Family History:  His family history is not on file.   Allergies No Known Allergies   Home Medications  Prior to Admission medications   Not on File     Critical care time: 50 mins       Posey Boyer, MSN, AG-ACNP-BC  [facility] 06/13/2023, 6:32 PM  See Amion for pager If no response to pager , please call [phone] until 7pm After 7:00 pm call Elink  [phone]

## 2023-06-13 NOTE — Progress Notes (Signed)
Notified Lab that ABG being sent for analysis. RN aware. 

## 2023-06-13 NOTE — ED Notes (Signed)
 More ice packs applied

## 2023-06-13 NOTE — Progress Notes (Addendum)
 eLink Physician-Brief Progress Note Patient Name: Brendan Preston DOB: 1975-07-20 MRN: [id number]   Date of Service  06/13/2023  HPI/Events of Note  BSRN called for RR 40s.  On camera evaluation, pt asleep, does not appear to be in significant distress. However, can appreciate, abdomen moving up and down up to 40-50/min. ABG had been done earlier which shows a primary respiratory alkalosis.   eICU Interventions  Pt on precedex drip.  Will give trial of fentanyl x1 Will continue to follow serial ABG Reviewed BMP - will replete K of 2.9  LP results pending        Chanoch Mccleery M DELA CRUZ 06/13/2023, 8:58 PM  10:48 PM LP could not be resulted as it was too bloody.  Would try and see if we could get a cell count, at least.   Lactate downtrending, but troponin is up to 1,286 from 694. ?from combination of severe sepsis and AKI Repeat EKG. Will continue to trend troponin.  Planned for echo in AM Continue IVF. Monitor I/Os   3:09 AM CPK uptrending, pt reported to have dark urine.  Still tachypnic in 30s-40s, but ABG stable.  Pt more awake, denies pain/shortness of breath.  Increased IVF to 11ml/hr  5:54 AM Notified that 4/4 bottles growing MRSA Pt already on empiric vancomycin.  Will follow up echo results.

## 2023-06-14 ENCOUNTER — Inpatient Hospital Stay (HOSPITAL_COMMUNITY): Payer: Self-pay

## 2023-06-14 ENCOUNTER — Other Ambulatory Visit (HOSPITAL_COMMUNITY): Payer: Self-pay

## 2023-06-14 DIAGNOSIS — R079 Chest pain, unspecified: Secondary | ICD-10-CM

## 2023-06-14 DIAGNOSIS — R21 Rash and other nonspecific skin eruption: Secondary | ICD-10-CM

## 2023-06-14 DIAGNOSIS — B9562 Methicillin resistant Staphylococcus aureus infection as the cause of diseases classified elsewhere: Secondary | ICD-10-CM

## 2023-06-14 DIAGNOSIS — A4102 Sepsis due to Methicillin resistant Staphylococcus aureus: Secondary | ICD-10-CM

## 2023-06-14 DIAGNOSIS — A419 Sepsis, unspecified organism: Secondary | ICD-10-CM

## 2023-06-14 DIAGNOSIS — G9340 Encephalopathy, unspecified: Secondary | ICD-10-CM

## 2023-06-14 DIAGNOSIS — F1910 Other psychoactive substance abuse, uncomplicated: Secondary | ICD-10-CM

## 2023-06-14 DIAGNOSIS — R6521 Severe sepsis with septic shock: Secondary | ICD-10-CM

## 2023-06-14 DIAGNOSIS — R509 Fever, unspecified: Secondary | ICD-10-CM

## 2023-06-14 LAB — BLOOD CULTURE ID PANEL (REFLEXED) - BCID2

## 2023-06-14 LAB — BASIC METABOLIC PANEL WITH GFR
Anion gap: 15 (ref 5–15)
Anion gap: 12 (ref 5–15)
BUN: 63 mg/dL — ABNORMAL HIGH (ref 6–20)
BUN: 61 mg/dL — ABNORMAL HIGH (ref 6–20)
CO2: 19 mmol/L — ABNORMAL LOW (ref 22–32)
CO2: 19 mmol/L — ABNORMAL LOW (ref 22–32)
Calcium: 7.2 mg/dL — ABNORMAL LOW (ref 8.9–10.3)
Calcium: 6.8 mg/dL — ABNORMAL LOW (ref 8.9–10.3)
Chloride: 107 mmol/L (ref 98–111)
Chloride: 107 mmol/L (ref 98–111)
Creatinine, Ser: 3.19 mg/dL — ABNORMAL HIGH (ref 0.61–1.24)
Creatinine, Ser: 3.33 mg/dL — ABNORMAL HIGH (ref 0.61–1.24)
GFR, Estimated: 23 mL/min — ABNORMAL LOW (ref >60)
GFR, Estimated: 22 mL/min — ABNORMAL LOW (ref >60)
Glucose, Bld: 169 mg/dL — ABNORMAL HIGH (ref 70–99)
Glucose, Bld: 128 mg/dL — ABNORMAL HIGH (ref 70–99)
Potassium: 3.2 mmol/L — ABNORMAL LOW (ref 3.5–5.1)
Potassium: 3.1 mmol/L — ABNORMAL LOW (ref 3.5–5.1)
Sodium: 141 mmol/L (ref 135–145)
Sodium: 138 mmol/L (ref 135–145)

## 2023-06-14 LAB — GLUCOSE, CAPILLARY
Glucose-Capillary: 120 mg/dL — ABNORMAL HIGH (ref 70–99)
Glucose-Capillary: 125 mg/dL — ABNORMAL HIGH (ref 70–99)
Glucose-Capillary: 138 mg/dL — ABNORMAL HIGH (ref 70–99)
Glucose-Capillary: 156 mg/dL — ABNORMAL HIGH (ref 70–99)
Glucose-Capillary: 97 mg/dL (ref 70–99)
Glucose-Capillary: 98 mg/dL (ref 70–99)

## 2023-06-14 LAB — HEMOGLOBIN A1C
Hgb A1c MFr Bld: 5.9 % — ABNORMAL HIGH (ref 4.8–5.6)
Mean Plasma Glucose: 122.63 mg/dL

## 2023-06-14 LAB — URINE CULTURE: Culture: NO GROWTH

## 2023-06-14 LAB — CBC
HCT: 37.7 % — ABNORMAL LOW (ref 39.0–52.0)
Hemoglobin: 12 g/dL — ABNORMAL LOW (ref 13.0–17.0)
MCH: 27.2 pg (ref 26.0–34.0)
MCHC: 31.8 g/dL (ref 30.0–36.0)
MCV: 85.5 fL (ref 80.0–100.0)
Platelets: 71 10*3/uL — ABNORMAL LOW (ref 150–400)
RBC: 4.41 MIL/uL (ref 4.22–5.81)
RDW: 16 % — ABNORMAL HIGH (ref 11.5–15.5)
WBC: 11.4 10*3/uL — ABNORMAL HIGH (ref 4.0–10.5)
nRBC: 0 % (ref 0.0–0.2)

## 2023-06-14 LAB — C-REACTIVE PROTEIN: CRP: 38.8 mg/dL — ABNORMAL HIGH (ref <1.0)

## 2023-06-14 LAB — MENINGITIS/ENCEPHALITIS PANEL (CSF)
Cryptococcus neoformans/gattii (CSF): NOT DETECTED — AB
Cytomegalovirus (CSF): NOT DETECTED — AB
Enterovirus (CSF): NOT DETECTED — AB
Escherichia coli K1 (CSF): NOT DETECTED — AB
Haemophilus influenzae (CSF): NOT DETECTED — AB
Herpes simplex virus 1 (CSF): NOT DETECTED — AB
Herpes simplex virus 2 (CSF): NOT DETECTED — AB
Human herpesvirus 6 (CSF): NOT DETECTED — AB
Human parechovirus (CSF): NOT DETECTED — AB
Listeria monocytogenes (CSF): NOT DETECTED — AB
Neisseria meningitis (CSF): NOT DETECTED — AB
Streptococcus agalactiae (CSF): NOT DETECTED — AB
Streptococcus pneumoniae (CSF): NOT DETECTED — AB
Varicella zoster virus (CSF): NOT DETECTED — AB

## 2023-06-14 LAB — CK
Total CK: 7145 U/L — ABNORMAL HIGH (ref 49–397)
Total CK: 5096 U/L — ABNORMAL HIGH (ref 49–397)

## 2023-06-14 LAB — LACTIC ACID, PLASMA: Lactic Acid, Venous: 2.2 mmol/L (ref 0.5–1.9)

## 2023-06-14 LAB — MAGNESIUM
Magnesium: 2.4 mg/dL (ref 1.7–2.4)
Magnesium: 2.2 mg/dL (ref 1.7–2.4)

## 2023-06-14 LAB — TROPONIN I (HIGH SENSITIVITY): Troponin I (High Sensitivity): 865 ng/L (ref <18)

## 2023-06-14 LAB — PHOSPHORUS: Phosphorus: 4.2 mg/dL (ref 2.5–4.6)

## 2023-06-14 LAB — VANCOMYCIN, RANDOM: Vancomycin Rm: 17 ug/mL

## 2023-06-14 LAB — SEDIMENTATION RATE: Sed Rate: 90 mm/h — ABNORMAL HIGH (ref 0–16)

## 2023-06-14 MED ORDER — PERFLUTREN LIPID MICROSPHERE
1.0000 mL | INTRAVENOUS | Status: AC | PRN
  Administered 2023-06-14: 2 mL via INTRAVENOUS

## 2023-06-14 MED ORDER — POTASSIUM CHLORIDE 10 MEQ/100ML IV SOLN
10.0000 meq | INTRAVENOUS | Status: AC
  Administered 2023-06-14 (×4): 10 meq via INTRAVENOUS
  Filled 2023-06-14 (×4): qty 100

## 2023-06-14 MED ORDER — VANCOMYCIN HCL 750 MG/150ML IV SOLN
750.0000 mg | INTRAVENOUS | Status: DC
  Administered 2023-06-14 – 2023-06-15 (×2): 750 mg via INTRAVENOUS
  Filled 2023-06-14 (×3): qty 150

## 2023-06-14 MED ORDER — LACTATED RINGERS IV BOLUS
1000.0000 mL | Freq: Once | INTRAVENOUS | Status: AC
  Administered 2023-06-14: 1000 mL via INTRAVENOUS

## 2023-06-14 MED ORDER — CHLORHEXIDINE GLUCONATE CLOTH 2 % EX PADS
6.0000 | MEDICATED_PAD | Freq: Every day | CUTANEOUS | Status: DC
  Administered 2023-06-16 – 2023-07-14 (×26): 6 via TOPICAL

## 2023-06-14 MED ORDER — HYDROMORPHONE HCL 1 MG/ML IJ SOLN
1.0000 mg | INTRAMUSCULAR | Status: DC | PRN
  Administered 2023-06-14 – 2023-06-17 (×24): 1 mg via INTRAVENOUS
  Filled 2023-06-14 (×25): qty 1

## 2023-06-14 MED ORDER — CHLORHEXIDINE GLUCONATE CLOTH 2 % EX PADS: 6.0000 | MEDICATED_PAD | Freq: Every day | CUTANEOUS | Status: DC

## 2023-06-14 MED ORDER — LACTATED RINGERS IV SOLN: INTRAVENOUS | Status: AC

## 2023-06-14 MED ORDER — SODIUM CHLORIDE 3 % IN NEBU
4.0000 mL | INHALATION_SOLUTION | Freq: Two times a day (BID) | RESPIRATORY_TRACT | Status: AC
Start: 2023-06-14 — End: 2023-06-16
  Administered 2023-06-14 – 2023-06-16 (×4): 4 mL via RESPIRATORY_TRACT
  Filled 2023-06-14 (×4): qty 4

## 2023-06-14 NOTE — Progress Notes (Signed)
 NAME:  Brendan Preston, MRN:  [id number], DOB:  05-13-1975, LOS: 1 ADMISSION DATE:  06/13/2023, CONSULTATION DATE:  06/13/23 REFERRING MD:  Dr. Doran Durand, CHIEF COMPLAINT:  AMS   History of Present Illness:  Pt encephalopathic, therefore HPI obtained from chart review.  See additional chart w/MRN [id number]  61 yoM with prior hx of known polysubstance (heroin/ fentanyl) abuse in which EMS was called out for AMS, last seen awake 3 days ago covered in urine and feces.   In ER, temp 104.3 rectal, tachycardic, normo to slightly hypertensive, sats 100% on NRB> but maintaining sats on RA with GCS ~10 but protecting airway.  Labs significant for lactic 8.4, WBC 15, plts 96, VBG 7.29/ 44,  CXR neg, RVP neg, EKG without ST changes, qtc borderline, CTH official read pending, trop hs 694, INR 1.3.  Foley placed- urine cloudy, glucose 150, Hgb 150 (-RBC), neg leuks/nitrates, WBC 6-10, UDS positive for THC and opiates.  CMET and other labs had to be redrawn and still awaiting due to hemolysis.  Cultures sent, empiric vanc/ cefepime given, 2L LR.  PCCM called for admit.  Pertinent  Medical History  Tobacco abuse, polysubstance abuse/ IVDA, ETOH unclear  Significant Hospital Events: Including procedures, antibiotic start and stop dates in addition to other pertinent events   3/7 admit, encephalopathic, altered, drug rash developed after cefepime  Interim History / Subjective:  Blood cultures positive for MRSA Fever and respiratory distress improved Asking for juice Objective   Blood pressure 91/64, pulse 66, temperature 99.1 F (37.3 C), temperature source Bladder, resp. rate (!) 9, height 5\' 11"  (1.803 m), weight 70.6 kg, SpO2 95%.        Intake/Output Summary (Last 24 hours) at 06/14/2023 0840 Last data filed at 06/14/2023 0630 Gross per 24 hour  Intake 5047.22 ml  Output 600 ml  Net 4447.22 ml   Filed Weights   06/13/23 1420 06/14/23 0500  Weight: 66.7 kg 70.6 kg    Examination: Gen: thin, no  respiratory distress ENT: dry mucus membranes CV: RRR, systolic and diastolic murmurs auscultated Resp: breathing non labored, no wheeze Abd: soft, nontender MSK: diffuse drug rash from yesterday is resolved, track marks noted on upper extremities Neuro: awake, alert, oriented calm  Blood cultures 4/4 positive for mrsa LP studies - too bloody to interpret CK>6000  Resolved Hospital Problem list   Cefepime drug rash  Assessment & Plan:   Severe sepsis MRSA Bacteremia Elevated Troponin - vancomycin, ceftriaxone stopped - stat echo for endocarditis - ID consultation - given compelling alternate explanation for sepsis will defer repeat LP for now - suspect troponin is elevated from demand but does raise concern for endocarditis, esr, crp pending  Acute metabolic encephalopathy Polysubstance abuse - UDS positive for benzos and thc - likely secondary to fevers, sepsis, gradually improving - will see if we can come down on precedex as tolerated - monitor for signs of withdrawal  AKI, non oliguric Rhabdomyolysis Hypokalemia - 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  Critical care was necessary to treat or prevent imminent or life-threatening deterioration.  Critical care was time spent personally by me on the following activities: development of treatment plan with patient and/or surrogate as well as nursing, discussions with consultants, evaluation of patient's  response to treatment, examination of patient, obtaining history from patient or surrogate, ordering and performing treatments and interventions, ordering and review of laboratory studies, ordering and review of radiographic studies, pulse oximetry, re-evaluation of patient's condition and participation in multidisciplinary rounds.   Critical Care Time devoted to patient care services described in this note is 40 minutes. This time reflects time of care of this signee Charlott Holler . This critical care time does not reflect separately billable procedures or procedure time, teaching time or supervisory time of PA/NP/Med student/Med Resident etc but could involve care discussion time.       Charlott Holler [facility] 06/14/2023 8:47 AM  Pager: see AMION  If no response to pager , please call critical care on call (see AMION) until 7pm After 7:00 pm call Elink

## 2023-06-14 NOTE — Progress Notes (Signed)
 eLink Physician-Brief Progress Note Patient Name: Brendan Preston DOB: Oct 09, 1975 MRN: [id number]   Date of Service  06/14/2023  HPI/Events of Note  Patient has increased WOB. BP 156/98 (113) [place] 2L sating 98% temp 102.9  Family member at bedside states 'same thing happened last night'  eICU Interventions  Withdrawal vs fever related - tylenol + ice packs Precedex gtt Saline nebs for congestion     Intervention Category Intermediate Interventions: Respiratory distress - evaluation and management  June Rode V. Ruthel Martine 06/14/2023, 10:32 PM

## 2023-06-14 NOTE — TOC Initial Note (Signed)
 Transition of Care [facility]) - Initial/Assessment Note    Patient Details  Name: Brendan Preston MRN: [id number] Date of Birth: 1976/02/04  Transition of Care [facility]) CM/SW Contact:    Diona Browner, LCSW Phone Number: 06/14/2023, 8:48 AM  Clinical Narrative:                 Pt from home with roommates. Pt currently oriented to self only. Continued medical workup, TOC following for d/c needs.     Barriers to Discharge: Continued Medical Work up   Patient Goals and CMS Choice Patient states their goals for this hospitalization and ongoing recovery are:: return home   Choice offered to / list presented to : NA [place] ownership interest in [facility].provided to::  (na)    Expected Discharge Plan and Services     Post Acute Care Choice: NA Living arrangements for the past 2 months: Single Family Home                   DME Agency: NA                  Prior Living Arrangements/Services Living arrangements for the past 2 months: Single Family Home Lives with:: Roommate Patient language and need for interpreter reviewed:: Yes Do you feel safe going back to the place where you live?: Yes      Need for Family Participation in Patient Care: Yes (Comment) Care giver support system in place?: Yes (comment)   Criminal Activity/Legal Involvement Pertinent to Current Situation/Hospitalization: No - Comment as needed  Activities of Daily Living      Permission Sought/Granted                  Emotional Assessment Appearance:: Appears older than stated age     Orientation: : Oriented to Self Alcohol / Substance Use: Illicit Drugs Psych Involvement: No (comment)  Admission diagnosis:  Severe sepsis (HCC) [A41.9, R65.20] Patient Active Problem List   Diagnosis Date Noted   Severe sepsis (HCC) 06/13/2023   PCP:  Patient, No Pcp Per Pharmacy:   [id number] - [place], [place] - [address] AT [facility] [address] [place]  [address] Phone: [phone] Fax: [phone]     Social Drivers of Health (SDOH) Social History: SDOH Screenings   Tobacco Use: High Risk (06/13/2023)   SDOH Interventions:     Readmission Risk Interventions    06/14/2023    8:46 AM  Readmission Risk Prevention Plan  Post Dischage Appt Complete  Medication Screening Complete  Transportation Screening Complete

## 2023-06-14 NOTE — Consult Note (Signed)
 [facility]    Date of Admission:  06/13/2023   Total days of inpatient antibiotics 1        Reason for Consult: MRSA bacteremia    Principal Problem:   [facility])   Assessment: 48 year old male with history of polysubstance abuse with fentanyl, heroin, clonazepam was found by roommates to be lethargic and not responsive this.  On arrival he had a temp of 105.6, WBC 15K.  Found to have #MRSA bacteremia  #Acute encephalopathy in the setting of  polysubstance abuse #Concern for drug rash-> tolerated cefepeime in the past on record review #Rhabdomyolysis - On arrival to the ED patient was lethargic.  Chest x-ray showed no acute abnormality.  He underwent LP which was clear first and then became bloody.  CSF WBC and RBC count was not pending as it was clotted.  ME panel negative.  He was started on vancomycin and cefepime.  Rash was noted that was not present on admission concern could be secondary to cefepime. - Found to have 2 out of 2 sets blood cultures started MRSA. - Suspect encephalopathy due to likely multifactorial including sepsis secondary to MRSA bacteremia and polysubstance abuse UDS positive for benzo and THC Recommendations:  -Continue vancomycin - D/C ceftriaxone - ME panel negative, ok to hold off on another LP form ID perspective. Pt was able to answer questions.  - Repeat blood cultures after 24 hours antibiotics - TTE, will need TEE - The rash has resolved. He has tolerated cefepime in the past so I suspect another etiology.  #IVDA-2 weeks ago (inj in left arm) #Reported Hx of HCV untreated(Dx 2 years ago) -HIV negaitve, order rpr, acute hep panel -HCV VL  Evaluation of this patient requires complex antimicrobial therapy evaluation and counseling + isolation needs for disease transmission risk assessment and mitigation    Microbiology:   Antibiotics: Cefepime 3/7 Ceftriaxone 3/7- Vancomycin  3/7-  Cultures: Blood 3/7 2/2 MRSA Urine  Other   HPI: Brendan Preston is a 48 y.o. male with history of active polysubstance abuse(meth, fentanyl, clonazepam) found at home to be lethargic, tobacco abuse, last seen awake 3 days ago covered in urine and feces.  He had a temp of 104.3 in the ED, WBC 11K.  Started on Vanco, cefepime.CT head showed no acute abnormality.  Chest x-ray showed mild ill-defined hazy perihilar opacity may be edema or atypical infection.  Question peripheral left lung base opacity versus overlying artifact.  There is a diffuse erythematous papular rash noted concern for urticaria not present on admission.  Patient underwent LP with 3.5 this CS mL CSF obtained which was clear and then became bloody from trauma.  WBC and RBC count not collected because of a clotted specimen, 87 glucose, greater than 612 protein.  ID engaged as blood cultures grew MRSA.   Review of Systems: ROS  Past Medical History:  Diagnosis Date   Hepatitis C     Social History   Tobacco Use   Smoking status: Every Day    Current packs/day: 1.00    Types: Cigarettes  Substance Use Topics   Alcohol use: Yes   Drug use: Yes    Types: IV, Marijuana    History reviewed. No pertinent family history. Scheduled Meds:  Chlorhexidine Gluconate Cloth  6 each Topical Daily   heparin  5,000 Units Subcutaneous Q8H   insulin aspart  0-9 Units Subcutaneous Q4H   thiamine (VITAMIN B1) injection  100 mg Intravenous Daily   Continuous Infusions:  dexmedetomidine (PRECEDEX) IV infusion 0.2 mcg/kg/hr (06/14/23 0959)   lactated ringers 150 mL/hr at 06/14/23 0959   vancomycin Stopped (06/14/23 1214)   PRN Meds:.acetaminophen, HYDROmorphone (DILAUDID) injection Allergies  Allergen Reactions   Penicillins Other (See Comments)    Noted on prior chart. Rash to cefepime 06/13/23, but tolerated ceftriaxone in 2020. Mother is unsure if he is truly allergic to penicillin. (2025)   Cefepime Rash     OBJECTIVE: Blood pressure 120/86, pulse 61, temperature 98.3 F (36.8 C), resp. rate (!) 34, height 5\' 11"  (1.803 m), weight 70.6 kg, SpO2 99%.  Physical Exam Constitutional:      General: He is not in acute distress.    Appearance: He is normal weight. He is not toxic-appearing.  HENT:     Head: Normocephalic and atraumatic.     Right Ear: External ear normal.     Left Ear: External ear normal.     Nose: No congestion or rhinorrhea.     Mouth/Throat:     Mouth: Mucous membranes are moist.     Pharynx: Oropharynx is clear.  Eyes:     Extraocular Movements: Extraocular movements intact.     Conjunctiva/sclera: Conjunctivae normal.     Pupils: Pupils are equal, round, and reactive to light.  Cardiovascular:     Rate and Rhythm: Normal rate and regular rhythm.     Heart sounds: No murmur heard.    No friction rub. No gallop.  Pulmonary:     Effort: Pulmonary effort is normal.     Breath sounds: Normal breath sounds.  Abdominal:     General: Abdomen is flat. Bowel sounds are normal.     Palpations: Abdomen is soft.  Musculoskeletal:        General: No swelling. Normal range of motion.     Cervical back: Normal range of motion and neck supple.  Skin:    General: Skin is warm and dry.  Neurological:     General: No focal deficit present.  Psychiatric:        Mood and Affect: Mood normal.     Lab Results Lab Results  Component Value Date   WBC 11.4 (H) 06/14/2023   HGB 12.0 (L) 06/14/2023   HCT 37.7 (L) 06/14/2023   MCV 85.5 06/14/2023   PLT 71 (L) 06/14/2023    Lab Results  Component Value Date   CREATININE 3.19 (H) 06/14/2023   BUN 63 (H) 06/14/2023   NA 141 06/14/2023   K 3.2 (L) 06/14/2023   CL 107 06/14/2023   CO2 19 (L) 06/14/2023    Lab Results  Component Value Date   ALT 35 06/13/2023   AST 119 (H) 06/13/2023   ALKPHOS 64 06/13/2023   BILITOT 1.2 06/13/2023       Danelle Earthly, MD [facility] 06/14/2023, 1:08 PM

## 2023-06-14 NOTE — Progress Notes (Signed)
 No application of heated high flow nasal cannula due to procedure in place. CCM and nursing aware of nightshift placement of machine. Reported to nightshift of situation.

## 2023-06-14 NOTE — Progress Notes (Signed)
 PHARMACY - PHYSICIAN COMMUNICATION CRITICAL VALUE ALERT - BLOOD CULTURE IDENTIFICATION (BCID)  Brendan Preston is an 48 y.o. male who presented to [facility] on 06/13/2023 with a chief complaint of AMS  Assessment: 4/4 staph aureus, MRSA +  Name of physician (or Provider) Contacted: Vladimir Faster  Current antibiotics: vanc and CTX  Changes to prescribed antibiotics recommended:  Patient is on recommended antibiotics - No changes needed  Results for orders placed or performed during the hospital encounter of 06/13/23  Blood Culture ID Panel (Reflexed) (Collected: 06/13/2023  1:49 PM)  Result Value Ref Range   Enterococcus faecalis NOT DETECTED NOT DETECTED   Enterococcus Faecium NOT DETECTED NOT DETECTED   Listeria monocytogenes NOT DETECTED NOT DETECTED   Staphylococcus species DETECTED (A) NOT DETECTED   Staphylococcus aureus (BCID) DETECTED (A) NOT DETECTED   Staphylococcus epidermidis NOT DETECTED NOT DETECTED   Staphylococcus lugdunensis NOT DETECTED NOT DETECTED   Streptococcus species NOT DETECTED NOT DETECTED   Streptococcus agalactiae NOT DETECTED NOT DETECTED   Streptococcus pneumoniae NOT DETECTED NOT DETECTED   Streptococcus pyogenes NOT DETECTED NOT DETECTED   A.calcoaceticus-baumannii NOT DETECTED NOT DETECTED   Bacteroides fragilis NOT DETECTED NOT DETECTED   Enterobacterales NOT DETECTED NOT DETECTED   Enterobacter cloacae complex NOT DETECTED NOT DETECTED   Escherichia coli NOT DETECTED NOT DETECTED   Klebsiella aerogenes NOT DETECTED NOT DETECTED   Klebsiella oxytoca NOT DETECTED NOT DETECTED   Klebsiella pneumoniae NOT DETECTED NOT DETECTED   Proteus species NOT DETECTED NOT DETECTED   Salmonella species NOT DETECTED NOT DETECTED   Serratia marcescens NOT DETECTED NOT DETECTED   Haemophilus influenzae NOT DETECTED NOT DETECTED   Neisseria meningitidis NOT DETECTED NOT DETECTED   Pseudomonas aeruginosa NOT DETECTED NOT DETECTED   Stenotrophomonas maltophilia NOT  DETECTED NOT DETECTED   Candida albicans NOT DETECTED NOT DETECTED   Candida auris NOT DETECTED NOT DETECTED   Candida glabrata NOT DETECTED NOT DETECTED   Candida krusei NOT DETECTED NOT DETECTED   Candida parapsilosis NOT DETECTED NOT DETECTED   Candida tropicalis NOT DETECTED NOT DETECTED   Cryptococcus neoformans/gattii NOT DETECTED NOT DETECTED   Meth resistant mecA/C and MREJ DETECTED (A) NOT DETECTED    Arley Phenix RPh 06/14/2023, 5:55 AM

## 2023-06-14 NOTE — Progress Notes (Signed)
 Pharmacy Antibiotic Note  Brendan Preston is a 48 y.o. male admitted on 06/13/2023 with sepsis, found at home lethargic and not responsive, history of polysubstance abuse.  Concern for meningitis, UTI, bacteremia.  Pharmacy has been consulted for Vancomycin dosing.  AKI noted on admission.  No baseline available.  SCr 3.3 > 3.19 Tm 106.1, currently 99.1.  WBC 15 > 11.  Received vancomycin 1000 mg (3/7 at 14:21) + 500 mg (3/7 15:27) x 1 in ED Possible rash noted to Cefepime; per CCM, pt tolerated Rocephin in the past   Plan: Continue Ceftriaxone 2g IV q12h Vancomycin 750 mg IV q24h  (SCr 3.19, TBW < IBW, est AUC 529, Cmin 15.9) Measure Vanc levels as needed.  Goal for meningitis is AUC > 500 and Cmin 15-20 mcg/mL.  Follow up renal function, culture results, and clinical course.   Weight: 70.6 kg (155 lb 10.3 oz)  Temp (24hrs), Avg:103.3 F (39.6 C), Min:99.1 F (37.3 C), Max:106.1 F (41.2 C)  Recent Labs  Lab 06/13/23 1345 06/13/23 1349 06/13/23 1652 06/13/23 1704 06/13/23 2034 06/14/23 0249  WBC 15.3*  --   --   --   --  11.4*  CREATININE  --   --  3.34*  --   --  3.19*  LATICACIDVEN  --  8.4*  --  8.0* 5.1*  --   VANCORANDOM  --   --   --   --   --  17    CrCl cannot be calculated (Unknown ideal weight.).    Allergies  Allergen Reactions   Penicillins Other (See Comments)    Noted on prior chart. Rash to cefepime 06/13/23, but tolerated ceftriaxone in 2020. Mother is unsure if he is truly allergic to penicillin. (2025)   Cefepime Rash    Antimicrobials this admission: 3/7 cefepime x1 3/7 vancomycin >> 3/7 Rocephin >>    Dose adjustments this admission: 3/8 Vancomycin level (random ~12 hr level, drawn 02:49): 17   Microbiology results: 3/7 Resp panel: negative covid, flu, rsv 3/7 BCx: 4/4 bottles GPC clusters 3/7 BCID:  MRSA 3/7 UCx: sent  3/7 LP:   Thank you for allowing pharmacy to be a part of this patient's care.  Lynann Beaver PharmD, BCPS [facility] main  pharmacy [phone] 06/14/2023 7:09 AM

## 2023-06-15 ENCOUNTER — Inpatient Hospital Stay (HOSPITAL_COMMUNITY): Payer: Self-pay

## 2023-06-15 LAB — GLUCOSE, CAPILLARY
Glucose-Capillary: 104 mg/dL — ABNORMAL HIGH (ref 70–99)
Glucose-Capillary: 106 mg/dL — ABNORMAL HIGH (ref 70–99)
Glucose-Capillary: 119 mg/dL — ABNORMAL HIGH (ref 70–99)
Glucose-Capillary: 183 mg/dL — ABNORMAL HIGH (ref 70–99)
Glucose-Capillary: 91 mg/dL (ref 70–99)
Glucose-Capillary: 94 mg/dL (ref 70–99)

## 2023-06-15 LAB — BASIC METABOLIC PANEL WITH GFR
Anion gap: 15 (ref 5–15)
BUN: 69 mg/dL — ABNORMAL HIGH (ref 6–20)
CO2: 16 mmol/L — ABNORMAL LOW (ref 22–32)
Calcium: 6.8 mg/dL — ABNORMAL LOW (ref 8.9–10.3)
Chloride: 107 mmol/L (ref 98–111)
Creatinine, Ser: 2.92 mg/dL — ABNORMAL HIGH (ref 0.61–1.24)
GFR, Estimated: 26 mL/min — ABNORMAL LOW (ref >60)
Glucose, Bld: 120 mg/dL — ABNORMAL HIGH (ref 70–99)
Potassium: 3.2 mmol/L — ABNORMAL LOW (ref 3.5–5.1)
Sodium: 138 mmol/L (ref 135–145)

## 2023-06-15 LAB — ECHOCARDIOGRAM COMPLETE
Area-P 1/2: 4.31 cm2
Calc EF: 49.7 %
Height: 71 in
S' Lateral: 3.8 cm
Single Plane A2C EF: 54.2 %
Single Plane A4C EF: 42.1 %
Weight: 2490.32 [oz_av]

## 2023-06-15 LAB — HEPATITIS PANEL, ACUTE
HCV Ab: REACTIVE — AB
Hep A IgM: NONREACTIVE
Hep B C IgM: NONREACTIVE
Hepatitis B Surface Ag: NONREACTIVE

## 2023-06-15 LAB — RPR: RPR Ser Ql: NONREACTIVE

## 2023-06-15 MED ORDER — ACETAMINOPHEN 325 MG PO TABS: 650.0000 mg | ORAL_TABLET | Freq: Four times a day (QID) | ORAL | Status: DC | PRN

## 2023-06-15 MED ORDER — POTASSIUM CHLORIDE 20 MEQ PO PACK
40.0000 meq | PACK | Freq: Four times a day (QID) | ORAL | Status: AC
  Administered 2023-06-15 (×2): 40 meq via ORAL
  Filled 2023-06-15 (×2): qty 2

## 2023-06-15 MED ORDER — SODIUM CHLORIDE 0.9 % IV SOLN
2.0000 g | INTRAVENOUS | Status: DC
  Administered 2023-06-15: 2 g via INTRAVENOUS
  Filled 2023-06-15: qty 20

## 2023-06-15 MED ORDER — OXYCODONE HCL 5 MG PO TABS
5.0000 mg | ORAL_TABLET | Freq: Four times a day (QID) | ORAL | Status: DC | PRN
  Administered 2023-06-16 – 2023-06-17 (×5): 5 mg via ORAL
  Filled 2023-06-15 (×7): qty 1

## 2023-06-15 MED ORDER — POTASSIUM CHLORIDE 10 MEQ/100ML IV SOLN
10.0000 meq | INTRAVENOUS | Status: AC
  Administered 2023-06-15: 10 meq via INTRAVENOUS
  Filled 2023-06-15 (×2): qty 100

## 2023-06-15 MED ORDER — ACETAMINOPHEN 160 MG/5ML PO SOLN
650.0000 mg | Freq: Four times a day (QID) | ORAL | Status: DC | PRN
  Administered 2023-06-15 – 2023-06-29 (×30): 650 mg
  Filled 2023-06-15 (×32): qty 20.3

## 2023-06-15 NOTE — Progress Notes (Signed)
 [facility]  Date of Admission:  06/13/2023   Total days of inpatient antibiotics 2  Principal Problem:   Severe sepsis [facility])          Assessment: 48 year old male with history of polysubstance abuse with fentanyl, heroin, clonazepam was found by roommates to be lethargic and not responsive this.  On arrival he had a temp of 105.6, WBC 15K.  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  Chest x-ray showed no acute abnormality.  He underwent LP which was clear first and then became bloody.  CSF WBC and RBC count was not pending as it was clotted.  ME panel negative.  He was started on vancomycin and cefepime.  Rash was noted that was not present on admission concern could be secondary to cefepime. - Found to have 2 out of 2 sets blood cultures started MRSA. - Suspect encephalopathy due to likely multifactorial including sepsis secondary to MRSA bacteremia and polysubstance abuse UDS positive for benzo and THC. ME panel negative. -HE developed fever overnight, cxr showed diffuse peripheral opacities concerning of multifocal PNA. Ordered CT chest showing nodular appearing opacities though out lung, cavitary suspicious for septic emboli Recommendations:  -Continue vancomycin - D/C ceftriaxone.  CT findings consistent with septic emboli(IE) given fever and acute worseinng of imaging, I think this is due to MRSA - Follow repeat blood Cx - TTE no veg. Plan on TEE tomorrow - The rash has resolved. He has tolerated cefepime in the past so I suspect another etiology. -Resp Cx   #IVDA-2 weeks ago (inj in left arm) #Chronic HCV-untreated) -HIV negaitve, RPR negative,  acute hep panel(HCV+) -HCV VL pending  New ID team Monday.    Evaluation of this patient requires complex antimicrobial therapy evaluation and counseling + isolation needs for disease transmission risk assessment and mitigation   Microbiology:   Antibiotics: Cefepime 3/7 Ceftriaxone 3/7-3/9 Vancomycin 3/7-   Cultures: Blood 3/7 2/2 MRSA   SUBJECTIVE: Resting in bed on [place] Interval: T max 103.6 overnight  Review of Systems: Review of Systems  All other systems reviewed and are negative.    Scheduled Meds:  Chlorhexidine Gluconate Cloth  6 each Topical Daily   heparin  5,000 Units Subcutaneous Q8H   insulin aspart  0-9 Units Subcutaneous Q4H   sodium chloride HYPERTONIC  4 mL Nebulization BID   thiamine (VITAMIN B1) injection  100 mg Intravenous Daily   Continuous Infusions:  cefTRIAXone (ROCEPHIN)  IV Stopped (06/15/23 1614)   dexmedetomidine (PRECEDEX) IV infusion 0.6 mcg/kg/hr (06/15/23 2028)   vancomycin Stopped (06/15/23 1013)   PRN Meds:.acetaminophen, acetaminophen, HYDROmorphone (DILAUDID) injection, oxyCODONE Allergies  Allergen Reactions   Penicillins Other (See Comments)    Noted on prior chart. Rash to cefepime 06/13/23, but tolerated ceftriaxone in 2020. Mother is unsure if he is truly allergic to penicillin. (2025)   Cefepime Rash    OBJECTIVE: Vitals:   06/15/23 1800 06/15/23 1900 06/15/23 1935 06/15/23 2048  BP: (!) 140/100 (!) 126/91    Pulse: 85 (!) 40    Resp: (!) 39 (!) 35    Temp:   (!) 100.6 F (38.1 C)   TempSrc:   Bladder   SpO2: 95% 92%  96%  Weight:  Height:       Body mass index is 22.78 kg/m.  Physical Exam Constitutional:      General: He is not in acute distress.    Appearance: He is normal weight. He is not toxic-appearing.  HENT:     Head: Normocephalic and atraumatic.     Right Ear: External ear normal.     Left Ear: External ear normal.     Nose: No congestion or rhinorrhea.     Mouth/Throat:     Mouth: Mucous membranes are moist.     Pharynx: Oropharynx is clear.   Eyes:     Extraocular Movements: Extraocular movements intact.     Conjunctiva/sclera: Conjunctivae normal.     Pupils: Pupils are equal, round, and reactive to light.  Cardiovascular:     Rate and Rhythm: Normal rate and regular rhythm.     Heart sounds: No murmur heard.    No friction rub. No gallop.  Pulmonary:     Effort: Pulmonary effort is normal.     Breath sounds: Normal breath sounds.  Abdominal:     General: Abdomen is flat. Bowel sounds are normal.     Palpations: Abdomen is soft.  Musculoskeletal:        General: No swelling. Normal range of motion.     Cervical back: Normal range of motion and neck supple.  Skin:    General: Skin is warm and dry.  Neurological:     General: No focal deficit present.     Mental Status: He is oriented to person, place, and time.  Psychiatric:        Mood and Affect: Mood normal.       Lab Results Lab Results  Component Value Date   WBC 11.4 (H) 06/14/2023   HGB 12.0 (L) 06/14/2023   HCT 37.7 (L) 06/14/2023   MCV 85.5 06/14/2023   PLT 71 (L) 06/14/2023    Lab Results  Component Value Date   CREATININE 2.92 (H) 06/15/2023   BUN 69 (H) 06/15/2023   NA 138 06/15/2023   K 3.2 (L) 06/15/2023   CL 107 06/15/2023   CO2 16 (L) 06/15/2023    Lab Results  Component Value Date   ALT 35 06/13/2023   AST 119 (H) 06/13/2023   ALKPHOS 64 06/13/2023   BILITOT 1.2 06/13/2023        Danelle Earthly, MD [facility] 06/15/2023, 10:00 PM

## 2023-06-15 NOTE — H&P (View-Only) (Signed)
 NAME:  Brendan Preston, MRN:  [id number], DOB:  03-Nov-1975, LOS: 2 ADMISSION DATE:  06/13/2023, CONSULTATION DATE:  06/13/23 REFERRING MD:  Dr. Doran Durand, CHIEF COMPLAINT:  AMS   History of Present Illness:  Pt encephalopathic, therefore HPI obtained from chart review.  See additional chart w/MRN [id number]  82 yoM with prior hx of known polysubstance (heroin/ fentanyl) abuse in which EMS was called out for AMS, last seen awake 3 days ago covered in urine and feces.   In ER, temp 104.3 rectal, tachycardic, normo to slightly hypertensive, sats 100% on NRB> but maintaining sats on RA with GCS ~10 but protecting airway.  Labs significant for lactic 8.4, WBC 15, plts 96, VBG 7.29/ 44,  CXR neg, RVP neg, EKG without ST changes, qtc borderline, CTH official read pending, trop hs 694, INR 1.3.  Foley placed- urine cloudy, glucose 150, Hgb 150 (-RBC), neg leuks/nitrates, WBC 6-10, UDS positive for THC and opiates.  CMET and other labs had to be redrawn and still awaiting due to hemolysis.  Cultures sent, empiric vanc/ cefepime given, 2L LR.  PCCM called for admit.  Pertinent  Medical History  Tobacco abuse, polysubstance abuse/ IVDA, ETOH unclear  Significant Hospital Events: Including procedures, antibiotic start and stop dates in addition to other pertinent events   3/7 admit, encephalopathic, altered, drug rash developed after cefepime 3/8 TTE negative for endocarditis  Interim History / Subjective:  Overnight febrile and had some associated increased work of breathing. Still with low grade fever this am.   Objective   Blood pressure 124/83, pulse 80, temperature (!) 100.4 F (38 C), temperature source Bladder, resp. rate (!) 38, height 5\' 11"  (1.803 m), weight 74.1 kg, SpO2 96%.    FiO2 (%):  [28 %] 28 %   Intake/Output Summary (Last 24 hours) at 06/15/2023 1033 Last data filed at 06/15/2023 0600 Gross per 24 hour  Intake 1976.36 ml  Output 800 ml  Net 1176.36 ml   Filed Weights   06/13/23  1420 06/14/23 0500 06/15/23 0500  Weight: 66.7 kg 70.6 kg 74.1 kg    Examination: Gen: thin, acutely ill appearing, fatigued ENT: dry mucus membranes CV: RRR, systolic murmur and diastolic murmur are present Resp: tachypnic but no wheeze Abd:  soft, nontender MSK: no rash, track marks on upper extremities Neuro: resting but arouses to voice, follows commands, denies pain  Blood cultures 4/4 positive for mrsa LP studies - too bloody to interpret CK>6000  Chest xray shows multifocal airspace opacities bilaterally ESR90 CRP 38.8   Resolved Hospital Problem list   Cefepime drug rash  Assessment & Plan:   Severe sepsis MRSA Bacteremia Elevated Troponin - continue vancomycin - TTE 3/8 discussed with on call cardiology, negative for endocarditis, appreciate their help in arranging TEE hopefully Monday based on availability.  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 Will update again today]  The  patient is critically ill due to encephalopathy, sepsis.  Critical care was necessary to treat or prevent imminent or life-threatening deterioration.  Critical care was time spent personally by me on the following activities: development of treatment plan with patient and/or surrogate as well as nursing, discussions with consultants, evaluation of patient's response to treatment, examination of patient, obtaining history from patient or surrogate, ordering and performing treatments and interventions, ordering and review of laboratory studies, ordering and review of radiographic studies, pulse oximetry, re-evaluation of patient's condition and participation in multidisciplinary rounds.   Critical Care Time devoted to patient care services described in this note is 40 minutes. This time reflects time of care of this signee Charlott Holler . This critical care time does not reflect separately billable procedures or procedure time, teaching time or supervisory time of PA/NP/Med student/Med Resident etc but could involve care discussion time.       Charlott Holler [facility] 06/15/2023 10:36 AM  Pager: see AMION  If no response to pager , please call critical care on call (see AMION) until 7pm After 7:00 pm call Elink

## 2023-06-15 NOTE — Progress Notes (Signed)
 NAME:  Brendan Preston, MRN:  [id number], DOB:  03-Nov-1975, LOS: 2 ADMISSION DATE:  06/13/2023, CONSULTATION DATE:  06/13/23 REFERRING MD:  Dr. Doran Durand, CHIEF COMPLAINT:  AMS   History of Present Illness:  Pt encephalopathic, therefore HPI obtained from chart review.  See additional chart w/MRN [id number]  82 yoM with prior hx of known polysubstance (heroin/ fentanyl) abuse in which EMS was called out for AMS, last seen awake 3 days ago covered in urine and feces.   In ER, temp 104.3 rectal, tachycardic, normo to slightly hypertensive, sats 100% on NRB> but maintaining sats on RA with GCS ~10 but protecting airway.  Labs significant for lactic 8.4, WBC 15, plts 96, VBG 7.29/ 44,  CXR neg, RVP neg, EKG without ST changes, qtc borderline, CTH official read pending, trop hs 694, INR 1.3.  Foley placed- urine cloudy, glucose 150, Hgb 150 (-RBC), neg leuks/nitrates, WBC 6-10, UDS positive for THC and opiates.  CMET and other labs had to be redrawn and still awaiting due to hemolysis.  Cultures sent, empiric vanc/ cefepime given, 2L LR.  PCCM called for admit.  Pertinent  Medical History  Tobacco abuse, polysubstance abuse/ IVDA, ETOH unclear  Significant Hospital Events: Including procedures, antibiotic start and stop dates in addition to other pertinent events   3/7 admit, encephalopathic, altered, drug rash developed after cefepime 3/8 TTE negative for endocarditis  Interim History / Subjective:  Overnight febrile and had some associated increased work of breathing. Still with low grade fever this am.   Objective   Blood pressure 124/83, pulse 80, temperature (!) 100.4 F (38 C), temperature source Bladder, resp. rate (!) 38, height 5\' 11"  (1.803 m), weight 74.1 kg, SpO2 96%.    FiO2 (%):  [28 %] 28 %   Intake/Output Summary (Last 24 hours) at 06/15/2023 1033 Last data filed at 06/15/2023 0600 Gross per 24 hour  Intake 1976.36 ml  Output 800 ml  Net 1176.36 ml   Filed Weights   06/13/23  1420 06/14/23 0500 06/15/23 0500  Weight: 66.7 kg 70.6 kg 74.1 kg    Examination: Gen: thin, acutely ill appearing, fatigued ENT: dry mucus membranes CV: RRR, systolic murmur and diastolic murmur are present Resp: tachypnic but no wheeze Abd:  soft, nontender MSK: no rash, track marks on upper extremities Neuro: resting but arouses to voice, follows commands, denies pain  Blood cultures 4/4 positive for mrsa LP studies - too bloody to interpret CK>6000  Chest xray shows multifocal airspace opacities bilaterally ESR90 CRP 38.8   Resolved Hospital Problem list   Cefepime drug rash  Assessment & Plan:   Severe sepsis MRSA Bacteremia Elevated Troponin - continue vancomycin - TTE 3/8 discussed with on call cardiology, negative for endocarditis, appreciate their help in arranging TEE hopefully Monday based on availability.  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 Will update again today]  The  patient is critically ill due to encephalopathy, sepsis.  Critical care was necessary to treat or prevent imminent or life-threatening deterioration.  Critical care was time spent personally by me on the following activities: development of treatment plan with patient and/or surrogate as well as nursing, discussions with consultants, evaluation of patient's response to treatment, examination of patient, obtaining history from patient or surrogate, ordering and performing treatments and interventions, ordering and review of laboratory studies, ordering and review of radiographic studies, pulse oximetry, re-evaluation of patient's condition and participation in multidisciplinary rounds.   Critical Care Time devoted to patient care services described in this note is 40 minutes. This time reflects time of care of this signee Charlott Holler . This critical care time does not reflect separately billable procedures or procedure time, teaching time or supervisory time of PA/NP/Med student/Med Resident etc but could involve care discussion time.       Charlott Holler [facility] 06/15/2023 10:36 AM  Pager: see AMION  If no response to pager , please call critical care on call (see AMION) until 7pm After 7:00 pm call Elink

## 2023-06-16 ENCOUNTER — Inpatient Hospital Stay (HOSPITAL_COMMUNITY): Payer: MEDICAID

## 2023-06-16 ENCOUNTER — Other Ambulatory Visit (HOSPITAL_COMMUNITY): Payer: Self-pay

## 2023-06-16 ENCOUNTER — Encounter (HOSPITAL_COMMUNITY)
Admission: EM | Disposition: A | Discharge status: Home / Self Care | Payer: Self-pay | Source: Home / Self Care | Attending: Internal Medicine

## 2023-06-16 DIAGNOSIS — M6282 Rhabdomyolysis: Secondary | ICD-10-CM

## 2023-06-16 DIAGNOSIS — I76 Septic arterial embolism: Secondary | ICD-10-CM

## 2023-06-16 DIAGNOSIS — E876 Hypokalemia: Secondary | ICD-10-CM

## 2023-06-16 DIAGNOSIS — N179 Acute kidney failure, unspecified: Secondary | ICD-10-CM

## 2023-06-16 DIAGNOSIS — R6520 Severe sepsis without septic shock: Secondary | ICD-10-CM

## 2023-06-16 DIAGNOSIS — G9341 Metabolic encephalopathy: Secondary | ICD-10-CM

## 2023-06-16 LAB — GLUCOSE, CAPILLARY
Glucose-Capillary: 104 mg/dL — ABNORMAL HIGH (ref 70–99)
Glucose-Capillary: 119 mg/dL — ABNORMAL HIGH (ref 70–99)
Glucose-Capillary: 120 mg/dL — ABNORMAL HIGH (ref 70–99)
Glucose-Capillary: 139 mg/dL — ABNORMAL HIGH (ref 70–99)
Glucose-Capillary: 139 mg/dL — ABNORMAL HIGH (ref 70–99)
Glucose-Capillary: 143 mg/dL — ABNORMAL HIGH (ref 70–99)
Glucose-Capillary: 174 mg/dL — ABNORMAL HIGH (ref 70–99)

## 2023-06-16 LAB — BASIC METABOLIC PANEL WITH GFR
Anion gap: 13 (ref 5–15)
BUN: 63 mg/dL — ABNORMAL HIGH (ref 6–20)
CO2: 18 mmol/L — ABNORMAL LOW (ref 22–32)
Calcium: 7.3 mg/dL — ABNORMAL LOW (ref 8.9–10.3)
Chloride: 111 mmol/L (ref 98–111)
Creatinine, Ser: 2.13 mg/dL — ABNORMAL HIGH (ref 0.61–1.24)
GFR, Estimated: 37 mL/min — ABNORMAL LOW (ref >60)
Glucose, Bld: 118 mg/dL — ABNORMAL HIGH (ref 70–99)
Potassium: 4.4 mmol/L (ref 3.5–5.1)
Sodium: 142 mmol/L (ref 135–145)

## 2023-06-16 LAB — CK: Total CK: 7159 U/L — ABNORMAL HIGH (ref 49–397)

## 2023-06-16 LAB — CBC
HCT: 42.3 % (ref 39.0–52.0)
Hemoglobin: 13.5 g/dL (ref 13.0–17.0)
MCH: 27.5 pg (ref 26.0–34.0)
MCHC: 31.9 g/dL (ref 30.0–36.0)
MCV: 86.2 fL (ref 80.0–100.0)
Platelets: 113 10*3/uL — ABNORMAL LOW (ref 150–400)
RBC: 4.91 MIL/uL (ref 4.22–5.81)
RDW: 18.2 % — ABNORMAL HIGH (ref 11.5–15.5)
WBC: 11.6 10*3/uL — ABNORMAL HIGH (ref 4.0–10.5)
nRBC: 0 % (ref 0.0–0.2)

## 2023-06-16 SURGERY — TRANSESOPHAGEAL ECHOCARDIOGRAM (TEE) (CATHLAB)
Anesthesia: Monitor Anesthesia Care

## 2023-06-16 MED ORDER — POLYETHYLENE GLYCOL 3350 17 G PO PACK: 17.0000 g | PACK | Freq: Every day | ORAL | Status: DC | PRN

## 2023-06-16 MED ORDER — BISACODYL 10 MG RE SUPP: 10.0000 mg | Freq: Every day | RECTAL | Status: DC | PRN

## 2023-06-16 MED ORDER — LACTATED RINGERS IV SOLN: INTRAVENOUS | Status: AC

## 2023-06-16 MED ORDER — CLONIDINE HCL 0.1 MG PO TABS
0.1000 mg | ORAL_TABLET | Freq: Two times a day (BID) | ORAL | Status: DC
  Administered 2023-06-16 – 2023-06-17 (×3): 0.1 mg via ORAL
  Filled 2023-06-16 (×4): qty 1

## 2023-06-16 MED ORDER — SENNOSIDES-DOCUSATE SODIUM 8.6-50 MG PO TABS
2.0000 | ORAL_TABLET | Freq: Two times a day (BID) | ORAL | Status: DC
  Administered 2023-06-17 (×2): 2 via ORAL
  Filled 2023-06-16 (×4): qty 2

## 2023-06-16 MED ORDER — VANCOMYCIN HCL IN DEXTROSE 1-5 GM/200ML-% IV SOLN
1000.0000 mg | INTRAVENOUS | Status: DC
  Administered 2023-06-16: 1000 mg via INTRAVENOUS
  Filled 2023-06-16: qty 200

## 2023-06-16 NOTE — Progress Notes (Addendum)
   06/16/23 1837  What Happened  Was fall witnessed? No  Was patient injured? Yes  Patient found on floor  Found by Staff-comment (Sec and charge RN responded to bed alarm, found pt on floor)  Stated prior activity other (comment) (Pt said he was trying to sit up and rolled out of bed)  Provider Notification  Provider Name/Title Wynona Neat, MD  Date Provider Notified 06/16/23  Time Provider Notified 1830  Method of Notification Call  Notification Reason Fall  Provider response See new orders  Date of Provider Response 06/16/23  Time of Provider Response 1832  Follow Up  Family notified No - patient refusal  Additional tests Yes-comment  Simple treatment Dressing  Progress note created (see row info) Yes  Blank note created Yes  Neurological  Level of Consciousness Alert  Orientation Level Oriented X4  Cognition Follows commands  R Pupil Size (mm) 4  R Pupil Shape Round  R Pupil Reaction Sluggish  L Pupil Size (mm) 4  L Pupil Shape Round  L Pupil Reaction Sluggish  Motor Function/Sensation Assessment Grip  R Hand Grip Weak  L Hand Grip Weak   Integumentary  Abrasion Location Nose  Abrasion Location Orientation Bilateral  Abrasion Intervention Cleansed   HR 103   BP 173/110 (127)    O296% at 4L simple mask   RR 42    Temp 38.8

## 2023-06-16 NOTE — Interval H&P Note (Signed)
 History and Physical Interval Note:  06/16/2023 9:56 AM  Brendan Preston  has presented today for surgery, with the diagnosis of bacteremia, endocarditis.  The various methods of treatment have been discussed with the patient and family. After consideration of risks, benefits and other options for treatment, the patient has consented to  Procedure(s): TRANSESOPHAGEAL ECHOCARDIOGRAM (N/A) as a surgical intervention.  The patient's history has been reviewed, patient examined, no change in status, stable for surgery.  I have reviewed the patient's chart and labs.  Questions were answered to the patient's satisfaction.     Brittania Sudbeck

## 2023-06-16 NOTE — Progress Notes (Signed)
 NAME:  Brendan Preston, MRN:  [id number], DOB:  [birth year], LOS: 3 ADMISSION DATE:  06/13/2023, CONSULTATION DATE:  06/13/23 REFERRING MD:  Dr. Doran Durand, CHIEF COMPLAINT:  AMS   History of Present Illness:  Pt encephalopathic, therefore HPI obtained from chart review.  See additional chart w/MRN [id number]  22 yoM with prior hx of known polysubstance (heroin/ fentanyl) abuse in which EMS was called out for AMS, last seen awake 3 days ago covered in urine and feces.   In ER, temp 104.3 rectal, tachycardic, normo to slightly hypertensive, sats 100% on NRB> but maintaining sats on RA with GCS ~10 but protecting airway.  Labs significant for lactic 8.4, WBC 15, plts 96, VBG 7.29/ 44,  CXR neg, RVP neg, EKG without ST changes, qtc borderline, CTH official read pending, trop hs 694, INR 1.3.  Foley placed- urine cloudy, glucose 150, Hgb 150 (-RBC), neg leuks/nitrates, WBC 6-10, UDS positive for THC and opiates.  CMET and other labs had to be redrawn and still awaiting due to hemolysis.  Cultures sent, empiric vanc/ cefepime given, 2L LR.  PCCM called for admit.  Pertinent  Medical History  Tobacco abuse, polysubstance abuse/ IVDA, ETOH unclear  Significant Hospital Events: Including procedures, antibiotic start and stop dates in addition to other pertinent events   3/7 admit, encephalopathic, altered, drug rash developed after cefepime 3/8 TTE negative for endocarditis 3/10 he had a TEE scheduled but sounds like he has declined this   Interim History / Subjective:  NAEO   0.4 precedex   Objective   Blood pressure (!) 148/106, pulse 84, temperature 98.8 F (37.1 C), temperature source Bladder, resp. rate (!) 34, height 5\' 11"  (1.803 m), weight 71.6 kg, SpO2 99%.        Intake/Output Summary (Last 24 hours) at 06/16/2023 1017 Last data filed at 06/16/2023 0531 Gross per 24 hour  Intake 583.88 ml  Output 750 ml  Net -166.12 ml   Filed Weights   06/14/23 0500 06/15/23 0500 06/16/23 0500   Weight: 70.6 kg 74.1 kg 71.6 kg    Examination: Gen: thin acutely and chronically ill  middle aged M NAD  ENT: NCAT dry mm poor dentition  CV: rr + murmur. Cap refill < 3 sec  Resp: diffuse rhonchi. Symmetrical chest expansion, shallow respirations Abd:  thin ndnt MSK: c/d/w  Neuro: AAOx4, generalized weakness    Resolved Hospital Problem list   Cefepime drug rash  Assessment & Plan:   Acute metabolic encephalopathy Polysubstance use disorder  -wean precedex -cessation counseling -micronutrient support -delirium precautions   Severe sepsis MRSA Bacteremia  Acute hypoxic resp failure  Septic emboli -pt wants to defer TEE which was intended for 3/10 to eval for infective endocarditis. Cards is following.   -will adv diet for today -ID is following -vanc  -f/u repeat cx  -wean O2 for goal > 92 -IS, flutter, mobility   AKI Hypokalemia  NAGMA Rhabdo -follow renal indices  -recheck a CK  -add mIVF, encourage PO intake    Best Practice (right click and "Reselect all SmartList Selections" daily)   Diet/type: clear liquids - can advance diet as tolerated based on mental status DVT prophylaxis prophylactic heparin  Pressure ulcer(s): N/A GI prophylaxis: N/A Lines: N/A Foley:  Yes, and it is still needed Code Status:  full code Last date of multidisciplinary goals of care discussion [mother heather updated by phone 3/8. Will update again today]    CRITICAL CARE Performed by: Lanier Clam   Total  critical care time: 36 minutes  Critical care time was exclusive of separately billable procedures and treating other patients. Critical care was necessary to treat or prevent imminent or life-threatening deterioration.  Critical care was time spent personally by me on the following activities: development of treatment plan with patient and/or surrogate as well as nursing, discussions with consultants, evaluation of patient's response to treatment, examination of  patient, obtaining history from patient or surrogate, ordering and performing treatments and interventions, ordering and review of laboratory studies, ordering and review of radiographic studies, pulse oximetry and re-evaluation of patient's condition.  Tessie Fass MSN, AGACNP-BC [facility] Amion for pager  06/16/2023, 10:17 AM

## 2023-06-16 NOTE — Progress Notes (Incomplete)
{  Important!!  Must Read All  Red and Blue text will automatically delete when note signed   Document Absolute and/or Relative Contraindications    Absolute - Perforated viscus, Esophageal stricture, Esophageal tumor, Esophageal perforation, laceration, Esophageal diverticulum, Active upper GI bleed Relative - History of radiation to neck and mediastinum, History of GI surgery, Recent upper GI bleed, Barrett's esophagus, History of dysphagia, Restriction of neck mobility (severe cervical arthritis, atlantoaxial joint disease), Symptomatic hiatal hernia, Esophageal varices, Active esophagitis, Active peptic ulcer disease    Document other considerations   If the pt has a coagulopathy with INR over 4 or Plt count less than 50K, is currently on O2, has a hx of OSA, reduced EF or severe valve stenosis or regurgitation  If pt also needs DCCV   Document if the patient has been appropriately anticoagulated if they are to have a DCCV:1} [facility] has been requested to perform a transesophageal echocardiogram on Gracy Racer for ***.    {Does the patient have any absolute or relative contraindications to a TEE?:[phone]}  {The patient UJW:[id number]}  {Select ONLY if also doing a cardioversion (Optional):[phone]}  {Agrees, Declines or Contraindication?:[phone]}  Signed, Basilio Cairo, PA-C  06/16/2023 9:18 AM

## 2023-06-16 NOTE — Progress Notes (Addendum)
 eLink Physician-Brief Progress Note Patient Name: Brendan Preston DOB: 06-12-1975 MRN: [id number]   Date of Service  06/16/2023  HPI/Events of Note  48 year old male that has a history of polysubstance abuse initially presented with acute respiratory failure with hypoxemia, MSSA bacteremia, septic emboli and encephalopathy.  Anticipating workup for endocarditis once patient's support system is available.  Notified about increased work of breathing.  On examination, the patient has tachypnea, shallow breaths, and copious oropharyngeal secretions.  He has a broken nose and is unable to tolerate standard nasal cannula-simple mask is in place.  eICU Interventions  Encourage incentive spirometry, CoughAssist, and placed in seated positioning.  Patient is responsive, oxygenating well, and although he has secretions, with assistance he is able to expectorate them.  Will obtain a chest radiograph to ensure no new structural abnormalities   2038 -radiograph reviewed with evidence of diffuse airspace disease without evidence of any critical findings.  Okay to proceed with CT head.  Remains tachypneic and has difficulty expectorating secretions but able to protect his airway.  0133 -refractory fever, patient intolerant of external cooling measures.  One-time Tylenol.  Intervention Category Major Interventions: Respiratory failure - evaluation and management  Elainna Eshleman 06/16/2023, 8:07 PM

## 2023-06-16 NOTE — Progress Notes (Signed)
 [facility]   Reason for visit: Follow up on bacteremia  Interval History: refused TEE for now, WBC 11.6, Tmax 100.6 over last 24 hours Day 4 antibiotics  Physical Exam: Constitutional:  Vitals:   06/16/23 0600 06/16/23 0633  BP: 125/84 (!) 148/106  Pulse: 77 84  Resp: (!) 31 (!) 34  Temp: 98.8 F (37.1 C)   SpO2: 98% 99%   patient appears in NAD Respiratory: Normal respiratory effort; CTA B Neuro: alert  Review of Systems: Integument/breast: negative for rash  Lab Results  Component Value Date   WBC 11.6 (H) 06/16/2023   HGB 13.5 06/16/2023   HCT 42.3 06/16/2023   MCV 86.2 06/16/2023   PLT 113 (L) 06/16/2023    Lab Results  Component Value Date   CREATININE 2.13 (H) 06/16/2023   BUN 63 (H) 06/16/2023   NA 142 06/16/2023   K 4.4 06/16/2023   CL 111 06/16/2023   CO2 18 (L) 06/16/2023    Lab Results  Component Value Date   ALT 35 06/13/2023   AST 119 (H) 06/13/2023   ALKPHOS 64 06/13/2023     Microbiology: Recent Results (from the past 240 hours)  Culture, blood (Routine X 2) w Reflex to ID Panel     Status: Abnormal (Preliminary result)   Collection Time: 06/13/23  1:49 PM   Specimen: BLOOD  Result Value Ref Range Status   Specimen Description   Final    BLOOD SITE NOT SPECIFIED Performed at [facility], [address], [place], [place] [address]    Special Requests   Final    BOTTLES DRAWN AEROBIC AND ANAEROBIC Blood Culture adequate volume Performed at [facility], [address], [place], [place] [address]    Culture  Setup Time   Final    GRAM POSITIVE COCCI IN CLUSTERS IN BOTH AEROBIC AND ANAEROBIC BOTTLES PHARMD E JACKSON 06/14/2023 @ 0551 BY AB    Culture (A)  Final    STAPHYLOCOCCUS AUREUS SUSCEPTIBILITIES TO FOLLOW Performed at [facility], [address] [place], [place] [address]    Report Status PENDING  Incomplete  Blood Culture ID Panel (Reflexed)      Status: Abnormal   Collection Time: 06/13/23  1:49 PM  Result Value Ref Range Status   Enterococcus faecalis NOT DETECTED NOT DETECTED Final   Enterococcus Faecium NOT DETECTED NOT DETECTED Final   Listeria monocytogenes NOT DETECTED NOT DETECTED Final   Staphylococcus species DETECTED (A) NOT DETECTED Final    Comment: CRITICAL RESULT CALLED TO, READ BACK BY AND VERIFIED WITH: PHARMD E JACKSON 06/14/2023 @ 0551 BY AB    Staphylococcus aureus (BCID) DETECTED (A) NOT DETECTED Final    Comment: Methicillin (oxacillin)-resistant Staphylococcus aureus (MRSA). MRSA is predictably resistant to beta-lactam antibiotics (except ceftaroline). Preferred therapy is vancomycin unless clinically contraindicated. Patient requires contact precautions if  hospitalized. CRITICAL RESULT CALLED TO, READ BACK BY AND VERIFIED WITH: PHARMD E JACKSON 06/14/2023 @ 0551 BY AB    Staphylococcus epidermidis NOT DETECTED NOT DETECTED Final   Staphylococcus lugdunensis NOT DETECTED NOT DETECTED Final   Streptococcus species NOT DETECTED NOT DETECTED Final   Streptococcus agalactiae NOT DETECTED NOT DETECTED Final   Streptococcus pneumoniae NOT DETECTED NOT DETECTED Final   Streptococcus pyogenes NOT DETECTED NOT DETECTED Final   A.calcoaceticus-baumannii NOT DETECTED NOT DETECTED Final   Bacteroides fragilis NOT DETECTED NOT DETECTED Final   Enterobacterales NOT DETECTED NOT DETECTED Final  Enterobacter cloacae complex NOT DETECTED NOT DETECTED Final   Escherichia coli NOT DETECTED NOT DETECTED Final   Klebsiella aerogenes NOT DETECTED NOT DETECTED Final   Klebsiella oxytoca NOT DETECTED NOT DETECTED Final   Klebsiella pneumoniae NOT DETECTED NOT DETECTED Final   Proteus species NOT DETECTED NOT DETECTED Final   Salmonella species NOT DETECTED NOT DETECTED Final   Serratia marcescens NOT DETECTED NOT DETECTED Final   Haemophilus influenzae NOT DETECTED NOT DETECTED Final   Neisseria meningitidis NOT DETECTED NOT  DETECTED Final   Pseudomonas aeruginosa NOT DETECTED NOT DETECTED Final   Stenotrophomonas maltophilia NOT DETECTED NOT DETECTED Final   Candida albicans NOT DETECTED NOT DETECTED Final   Candida auris NOT DETECTED NOT DETECTED Final   Candida glabrata NOT DETECTED NOT DETECTED Final   Candida krusei NOT DETECTED NOT DETECTED Final   Candida parapsilosis NOT DETECTED NOT DETECTED Final   Candida tropicalis NOT DETECTED NOT DETECTED Final   Cryptococcus neoformans/gattii NOT DETECTED NOT DETECTED Final   Meth resistant mecA/C and MREJ DETECTED (A) NOT DETECTED Final    Comment: CRITICAL RESULT CALLED TO, READ BACK BY AND VERIFIED WITH: PHARMD E JACKSON 06/14/2023 @ 0551 BY AB Performed at [facility], [address], [place], [place] [address]   Blood culture (routine x 2)     Status: Abnormal (Preliminary result)   Collection Time: 06/13/23  1:55 PM   Specimen: BLOOD RIGHT ARM  Result Value Ref Range Status   Specimen Description   Final    BLOOD RIGHT ARM Performed at [facility], [address], [place], [place] [address]    Special Requests   Final    BOTTLES DRAWN AEROBIC AND ANAEROBIC Blood Culture results may not be optimal due to an inadequate volume of blood received in culture bottles Performed at [facility], [address] [place], [place] [address]    Culture  Setup Time   Final    GRAM POSITIVE COCCI IN CLUSTERS IN BOTH AEROBIC AND ANAEROBIC BOTTLES CRITICAL VALUE NOTED.  VALUE IS CONSISTENT WITH PREVIOUSLY REPORTED AND CALLED VALUE. Performed at [facility], [address] [place], [place] [address]    Culture STAPHYLOCOCCUS AUREUS (A)  Final   Report Status PENDING  Incomplete  Resp panel by RT-PCR (RSV, Flu A&B, Covid) Anterior Nasal Swab     Status: None   Collection Time: 06/13/23  1:56 PM   Specimen: Anterior Nasal Swab  Result Value Ref Range Status   SARS Coronavirus 2 by RT PCR NEGATIVE NEGATIVE Final    Comment:  (NOTE) SARS-CoV-2 target nucleic acids are NOT DETECTED.  The SARS-CoV-2 RNA is generally detectable in upper respiratory specimens during the acute phase of infection. The lowest concentration of SARS-CoV-2 viral copies this assay can detect is 138 copies/mL. A negative result does not preclude SARS-Cov-2 infection and should not be used as the sole basis for treatment or other patient management decisions. A negative result may occur with  improper specimen collection/handling, submission of specimen other than nasopharyngeal swab, presence of viral mutation(s) within the areas targeted by this assay, and inadequate number of viral copies(<138 copies/mL). A negative result must be combined with clinical observations, patient history, and epidemiological information. The expected result is Negative.  Fact Sheet for Patients:  BloggerCourse.com  Fact Sheet for Healthcare Providers:  SeriousBroker.it  This test is no t yet approved or cleared by the [place] FDA and  has been authorized for detection  and/or diagnosis of SARS-CoV-2 by FDA under an Emergency Use Authorization (EUA). This EUA will remain  in effect (meaning this test can be used) for the duration of the COVID-19 declaration under Section 564(b)(1) of the Act, 21 U.S.C.section 360bbb-3(b)(1), unless the authorization is terminated  or revoked sooner.       Influenza A by PCR NEGATIVE NEGATIVE Final   Influenza B by PCR NEGATIVE NEGATIVE Final    Comment: (NOTE) The Xpert Xpress SARS-CoV-2/FLU/RSV plus assay is intended as an aid in the diagnosis of influenza from Nasopharyngeal swab specimens and should not be used as a sole basis for treatment. Nasal washings and aspirates are unacceptable for Xpert Xpress SARS-CoV-2/FLU/RSV testing.  Fact Sheet for Patients: BloggerCourse.com  Fact Sheet for Healthcare  Providers: SeriousBroker.it  This test is not yet approved or cleared by the [place] FDA and has been authorized for detection and/or diagnosis of SARS-CoV-2 by FDA under an Emergency Use Authorization (EUA). This EUA will remain in effect (meaning this test can be used) for the duration of the COVID-19 declaration under Section 564(b)(1) of the Act, 21 U.S.C. section 360bbb-3(b)(1), unless the authorization is terminated or revoked.     Resp Syncytial Virus by PCR NEGATIVE NEGATIVE Final    Comment: (NOTE) Fact Sheet for Patients: BloggerCourse.com  Fact Sheet for Healthcare Providers: SeriousBroker.it  This test is not yet approved or cleared by the [place] FDA and has been authorized for detection and/or diagnosis of SARS-CoV-2 by FDA under an Emergency Use Authorization (EUA). This EUA will remain in effect (meaning this test can be used) for the duration of the COVID-19 declaration under Section 564(b)(1) of the Act, 21 U.S.C. section 360bbb-3(b)(1), unless the authorization is terminated or revoked.  Performed at [facility], [address], [place], [place] [address]   Urine Culture     Status: None   Collection Time: 06/13/23  2:27 PM   Specimen: Urine, Clean Catch  Result Value Ref Range Status   Specimen Description   Final    URINE, CLEAN CATCH Performed at [facility], [address], [place], [place] [address]    Special Requests   Final    NONE Performed at [facility], [address], [place], [place] [address]    Culture   Final    NO GROWTH Performed at [facility], [address] [place], [place] [address]    Report Status 06/14/2023 FINAL  Final  Culture, blood (Routine X 2) w Reflex to ID Panel     Status: None (Preliminary result)   Collection Time: 06/15/23 10:27 PM   Specimen: BLOOD RIGHT HAND   Result Value Ref Range Status   Specimen Description   Final    BLOOD RIGHT HAND Performed at [facility], [address], [place], [place] [address]    Special Requests   Final    BOTTLES DRAWN AEROBIC ONLY Blood Culture results may not be optimal due to an inadequate volume of blood received in culture bottles Performed at [facility], [address] [place], [place] [address]    Culture PENDING  Incomplete   Report Status PENDING  Incomplete  Culture, blood (Routine X 2) w Reflex to ID Panel     Status: None (Preliminary result)   Collection Time: 06/15/23 10:27 PM   Specimen: BLOOD RIGHT HAND  Result Value Ref Range Status   Specimen Description   Final  BLOOD RIGHT HAND Performed at [facility], [address] [place], [place] [address]    Special Requests   Final    BOTTLES DRAWN AEROBIC ONLY Blood Culture results may not be optimal due to an inadequate volume of blood received in culture bottles Performed at [facility], [address] [place], [place] [address]    Culture PENDING  Incomplete   Report Status PENDING  Incomplete    Impression/Plan:  1. 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  Repeat blood cultures from yesterday ngtd (one bottle each set, same hand).   Will continue with vancomycin with septic pulmonary emboli and potential CNS emboli.   TEE deferred due to patient request  2.  Substance abuse - will need continued efforts to remain drug free  3.  Fever - fever curve trending down and will continue to monitor.  With emboli disease, he may have intermittent high spikes of fever.    4.  Medication monitoring - creat improving, down to 2.13 and will continue to monitor while on vancomycin.    5.  IP - he continues on contact isolation with positive MRSA   Evaluation of this patient requires complex antimicrobial therapy evaluation and counseling + isolation  needs for disease transmission risk assessment and mitigation.

## 2023-06-16 NOTE — Progress Notes (Signed)
 Pharmacy Antibiotic Note  Brendan Preston is a 48 y.o. male admitted on 06/13/2023 with sepsis, found at home lethargic and not responsive, history of polysubstance abuse.  Concern for meningitis, UTI, bacteremia.  Pharmacy has been consulted for Vancomycin dosing.  AKI noted on admission.  No baseline available.  SCr 3.3 > 2.13 Tm 100.6, improving.  WBC down to 11.6 Possible rash noted to Cefepime; per CCM, pt tolerated Rocephin in the past   Plan: Vancomycin increase to 1000 mg IV q24h  (SCr 2.13, TBW < IBW, est AUC 484) Measure Vanc levels as needed.  Goal AUC 400-550.  Follow up renal function, culture results, and clinical course.   Height: 5\' 11"  (180.3 cm) Weight: 71.6 kg (157 lb 13.6 oz) IBW/kg (Calculated) : 75.3  Temp (24hrs), Avg:99.6 F (37.6 C), Min:98.4 F (36.9 C), Max:100.6 F (38.1 C)  Recent Labs  Lab 06/13/23 1345 06/13/23 1349 06/13/23 1652 06/13/23 1704 06/13/23 2034 06/14/23 0249 06/14/23 1048 06/15/23 0820  WBC 15.3*  --   --   --   --  11.4*  --   --   CREATININE  --   --  3.34*  --   --  3.19* 3.33* 2.92*  LATICACIDVEN  --  8.4*  --  8.0* 5.1*  --  2.2*  --   VANCORANDOM  --   --   --   --   --  17  --   --     Estimated Creatinine Clearance: 31.3 mL/min (A) (by C-G formula based on SCr of 2.92 mg/dL (H)).    Allergies  Allergen Reactions   Penicillins Other (See Comments)    Noted on prior chart. Rash to cefepime 06/13/23, but tolerated ceftriaxone in 2020. Mother is unsure if he is truly allergic to penicillin. (2025)   Cefepime Rash    Antimicrobials this admission: 3/7 cefepime x1 3/7 vancomycin >> 3/7 Rocephin >> 3/8   Dose adjustments this admission: 3/8 Vancomycin level (random ~12 hr level, drawn 02:49): 17 3/10 empiric vanc renal adjustment   Microbiology results: 3/7 Resp panel: negative covid, flu, rsv 3/7 BCx: 4/4 bottles Staph aureus 3/7 BCID:  MRSA 3/7 UCx: NGF  3/7 LP: unable to read d/t blood 3/7  Meningitis/Encephalitis Panel (CSF): none detected 3/9 BCx: ip  Thank you for allowing pharmacy to be a part of this patient's care.  Lynann Beaver PharmD, BCPS [facility] main pharmacy [phone] 06/16/2023 7:32 AM

## 2023-06-17 ENCOUNTER — Inpatient Hospital Stay (HOSPITAL_COMMUNITY): Payer: MEDICAID

## 2023-06-17 DIAGNOSIS — B9562 Methicillin resistant Staphylococcus aureus infection as the cause of diseases classified elsewhere: Principal | ICD-10-CM | POA: Diagnosis present

## 2023-06-17 DIAGNOSIS — I76 Septic arterial embolism: Secondary | ICD-10-CM | POA: Diagnosis present

## 2023-06-17 DIAGNOSIS — R7881 Bacteremia: Principal | ICD-10-CM | POA: Diagnosis present

## 2023-06-17 LAB — BASIC METABOLIC PANEL WITH GFR
Anion gap: 13 (ref 5–15)
BUN: 45 mg/dL — ABNORMAL HIGH (ref 6–20)
CO2: 21 mmol/L — ABNORMAL LOW (ref 22–32)
Calcium: 7.7 mg/dL — ABNORMAL LOW (ref 8.9–10.3)
Chloride: 106 mmol/L (ref 98–111)
Creatinine, Ser: 1.43 mg/dL — ABNORMAL HIGH (ref 0.61–1.24)
GFR, Estimated: >60 mL/min (ref >60)
Glucose, Bld: 133 mg/dL — ABNORMAL HIGH (ref 70–99)
Potassium: 3.6 mmol/L (ref 3.5–5.1)
Sodium: 140 mmol/L (ref 135–145)

## 2023-06-17 LAB — CULTURE, BLOOD (ROUTINE X 2)

## 2023-06-17 LAB — BLOOD GAS, ARTERIAL
Acid-base deficit: 2.9 mmol/L — ABNORMAL HIGH (ref 0.0–2.0)
Bicarbonate: 24.8 mmol/L (ref 20.0–28.0)
Drawn by: 25770
FIO2: 100 %
MECHVT: 600 mL
O2 Saturation: 100 %
PEEP: 5 cmH2O
Patient temperature: 37.5
RATE: 16 {breaths}/min
pCO2 arterial: 55 mmHg — ABNORMAL HIGH (ref 32–48)
pH, Arterial: 7.26 — ABNORMAL LOW (ref 7.35–7.45)
pO2, Arterial: 239 mmHg — ABNORMAL HIGH (ref 83–108)

## 2023-06-17 LAB — GLUCOSE, CAPILLARY
Glucose-Capillary: 127 mg/dL — ABNORMAL HIGH (ref 70–99)
Glucose-Capillary: 114 mg/dL — ABNORMAL HIGH (ref 70–99)
Glucose-Capillary: 115 mg/dL — ABNORMAL HIGH (ref 70–99)
Glucose-Capillary: 120 mg/dL — ABNORMAL HIGH (ref 70–99)
Glucose-Capillary: 119 mg/dL — ABNORMAL HIGH (ref 70–99)

## 2023-06-17 LAB — CK: Total CK: 11026 U/L — ABNORMAL HIGH (ref 49–397)

## 2023-06-17 LAB — C-REACTIVE PROTEIN: CRP: 30.2 mg/dL — ABNORMAL HIGH (ref <1.0)

## 2023-06-17 LAB — MAGNESIUM: Magnesium: 2.3 mg/dL (ref 1.7–2.4)

## 2023-06-17 MED ORDER — KETAMINE HCL 50 MG/5ML IJ SOSY
PREFILLED_SYRINGE | INTRAMUSCULAR | Status: AC
  Filled 2023-06-17: qty 5

## 2023-06-17 MED ORDER — LACTATED RINGERS IV BOLUS
500.0000 mL | Freq: Once | INTRAVENOUS | Status: AC
  Administered 2023-06-17: 500 mL via INTRAVENOUS

## 2023-06-17 MED ORDER — KETAMINE HCL 50 MG/5ML IJ SOSY: 2.0000 mg/kg | PREFILLED_SYRINGE | Freq: Once | INTRAMUSCULAR | Status: AC

## 2023-06-17 MED ORDER — PHENYLEPHRINE HCL-NACL 20-0.9 MG/250ML-% IV SOLN
INTRAVENOUS | Status: AC
  Filled 2023-06-17: qty 250

## 2023-06-17 MED ORDER — PHENYLEPHRINE 80 MCG/ML (10ML) SYRINGE FOR IV PUSH (FOR BLOOD PRESSURE SUPPORT): 80.0000 ug | PREFILLED_SYRINGE | Freq: Once | INTRAVENOUS | Status: AC | PRN

## 2023-06-17 MED ORDER — POTASSIUM CHLORIDE CRYS ER 20 MEQ PO TBCR
40.0000 meq | EXTENDED_RELEASE_TABLET | Freq: Once | ORAL | Status: AC
  Administered 2023-06-17: 40 meq via ORAL
  Filled 2023-06-17: qty 2

## 2023-06-17 MED ORDER — MIDAZOLAM HCL 2 MG/2ML IJ SOLN
2.0000 mg | Freq: Once | INTRAMUSCULAR | Status: AC
  Administered 2023-06-17: 2 mg via INTRAVENOUS

## 2023-06-17 MED ORDER — ROCURONIUM BROMIDE 10 MG/ML (PF) SYRINGE: 60.0000 mg | PREFILLED_SYRINGE | Freq: Once | INTRAVENOUS | Status: AC

## 2023-06-17 MED ORDER — VANCOMYCIN HCL 750 MG/150ML IV SOLN
750.0000 mg | Freq: Two times a day (BID) | INTRAVENOUS | Status: DC
  Administered 2023-06-17 – 2023-06-27 (×21): 750 mg via INTRAVENOUS
  Filled 2023-06-17 (×21): qty 150

## 2023-06-17 MED ORDER — FENTANYL CITRATE (PF) 100 MCG/2ML IJ SOLN: 100.0000 ug | Freq: Once | INTRAMUSCULAR | Status: AC

## 2023-06-17 MED ORDER — PHENYLEPHRINE 80 MCG/ML (10ML) SYRINGE FOR IV PUSH (FOR BLOOD PRESSURE SUPPORT): 160.0000 ug | PREFILLED_SYRINGE | Freq: Once | INTRAVENOUS | Status: AC

## 2023-06-17 MED ORDER — DEXMEDETOMIDINE HCL IN NACL 200 MCG/50ML IV SOLN
0.0000 ug/kg/h | INTRAVENOUS | Status: DC
  Administered 2023-06-17 (×2): 0.4 ug/kg/h via INTRAVENOUS
  Administered 2023-06-18 (×6): 1.1 ug/kg/h via INTRAVENOUS
  Administered 2023-06-19 (×3): 1.2 ug/kg/h via INTRAVENOUS
  Filled 2023-06-17 (×9): qty 50

## 2023-06-17 MED ORDER — SODIUM CHLORIDE 0.9 % IV SOLN: 250.0000 mL | INTRAVENOUS | Status: AC

## 2023-06-17 MED ORDER — PHENYLEPHRINE HCL-NACL 20-0.9 MG/250ML-% IV SOLN: 0.0000 ug/min | INTRAVENOUS | Status: DC

## 2023-06-17 MED ORDER — PHENYLEPHRINE 80 MCG/ML (10ML) SYRINGE FOR IV PUSH (FOR BLOOD PRESSURE SUPPORT)
560.0000 ug | PREFILLED_SYRINGE | Freq: Once | INTRAVENOUS | Status: AC
  Administered 2023-06-17: 560 ug via INTRAVENOUS

## 2023-06-17 MED ORDER — PHENYLEPHRINE 80 MCG/ML (10ML) SYRINGE FOR IV PUSH (FOR BLOOD PRESSURE SUPPORT)
80.0000 ug | PREFILLED_SYRINGE | Freq: Once | INTRAVENOUS | Status: AC | PRN
  Administered 2023-06-17: 160 ug via INTRAVENOUS

## 2023-06-17 MED ORDER — MIDAZOLAM HCL 2 MG/2ML IJ SOLN
1.0000 mg | Freq: Once | INTRAMUSCULAR | Status: AC
  Filled 2023-06-17: qty 2

## 2023-06-17 MED ORDER — KETAMINE HCL 50 MG/5ML IJ SOSY
PREFILLED_SYRINGE | INTRAMUSCULAR | Status: AC
  Administered 2023-06-17: 120 mg via INTRAVENOUS
  Filled 2023-06-17: qty 10

## 2023-06-17 MED ORDER — PHENYLEPHRINE 80 MCG/ML (10ML) SYRINGE FOR IV PUSH (FOR BLOOD PRESSURE SUPPORT)
PREFILLED_SYRINGE | INTRAVENOUS | Status: AC
  Administered 2023-06-17: 80 ug via INTRAVENOUS
  Filled 2023-06-17: qty 10

## 2023-06-17 MED ORDER — ORAL CARE MOUTH RINSE: 15.0000 mL | OROMUCOSAL | Status: DC | PRN

## 2023-06-17 MED ORDER — FENTANYL CITRATE PF 50 MCG/ML IJ SOSY
25.0000 ug | PREFILLED_SYRINGE | INTRAMUSCULAR | Status: DC | PRN
  Administered 2023-06-18: 50 ug via INTRAVENOUS
  Filled 2023-06-17: qty 1

## 2023-06-17 MED ORDER — PHENYLEPHRINE HCL-NACL 20-0.9 MG/250ML-% IV SOLN
25.0000 ug/min | INTRAVENOUS | Status: DC
  Administered 2023-06-17: 25 ug/min via INTRAVENOUS
  Administered 2023-06-18: 75 ug/min via INTRAVENOUS
  Filled 2023-06-17: qty 250

## 2023-06-17 MED ORDER — ACETAMINOPHEN 500 MG PO TABS
1000.0000 mg | ORAL_TABLET | Freq: Once | ORAL | Status: AC
  Administered 2023-06-17: 1000 mg via ORAL
  Filled 2023-06-17: qty 2

## 2023-06-17 MED ORDER — ROCURONIUM BROMIDE 10 MG/ML (PF) SYRINGE
PREFILLED_SYRINGE | INTRAVENOUS | Status: AC
  Administered 2023-06-17: 60 mg via INTRAVENOUS
  Filled 2023-06-17: qty 10

## 2023-06-17 MED ORDER — FENTANYL CITRATE (PF) 100 MCG/2ML IJ SOLN
INTRAMUSCULAR | Status: AC
  Filled 2023-06-17: qty 2

## 2023-06-17 MED ORDER — FENTANYL CITRATE (PF) 100 MCG/2ML IJ SOLN
INTRAMUSCULAR | Status: AC
  Administered 2023-06-17: 100 ug via INTRAVENOUS
  Filled 2023-06-17: qty 2

## 2023-06-17 MED ORDER — ORAL CARE MOUTH RINSE
15.0000 mL | OROMUCOSAL | Status: DC
  Administered 2023-06-17 – 2023-06-30 (×154): 15 mL via OROMUCOSAL

## 2023-06-17 MED ORDER — ETOMIDATE 2 MG/ML IV SOLN
INTRAVENOUS | Status: AC
Start: 2023-06-17 — End: 2023-06-18
  Filled 2023-06-17: qty 20

## 2023-06-17 MED ORDER — MIDAZOLAM HCL 2 MG/2ML IJ SOLN
1.0000 mg | INTRAMUSCULAR | Status: AC | PRN
Start: 2023-06-17 — End: 2023-06-20
  Administered 2023-06-17 – 2023-06-20 (×10): 2 mg via INTRAVENOUS
  Filled 2023-06-17 (×10): qty 2

## 2023-06-17 MED ORDER — MIDAZOLAM HCL 2 MG/2ML IJ SOLN
INTRAMUSCULAR | Status: AC
Start: 2023-06-17 — End: 2023-06-17
  Administered 2023-06-17: 1 mg via INTRAVENOUS
  Filled 2023-06-17: qty 2

## 2023-06-17 MED ORDER — VANCOMYCIN HCL 1250 MG/250ML IV SOLN
1250.0000 mg | INTRAVENOUS | Status: DC
Start: 2023-06-17 — End: 2023-06-17
  Filled 2023-06-17: qty 250

## 2023-06-17 NOTE — Progress Notes (Signed)
 Requiring some Neo-Synephrine  Blood pressure better, being weaned off Neo-Synephrine  On Precedex 0.4

## 2023-06-17 NOTE — Progress Notes (Signed)
 PT Cancellation Note  Patient Details Name: Brendan Preston MRN: [id number] DOB: 1975-07-23   Cancelled Treatment:    Reason Eval/Treat Not Completed: Medical issues which prohibited therapy Patient on VM, appears   to have  respiratory efforts. Patient declines, friend in room  states patient waiting for mother to arrive. Will check back another time. [name] PT Acute Rehabilitation Services Office [phone]   [name] 06/17/2023, 2:58 PM

## 2023-06-17 NOTE — Progress Notes (Signed)
 Tried to reach pt mom, no answer    Tessie Fass MSN, AGACNP-BC [facility] 06/17/2023, 12:59 PM

## 2023-06-17 NOTE — Progress Notes (Signed)
 As per patient's visitor at bedside, he wanted to transfer to [facility]. The lady visitor (said she is [employer] fundraiser) turned off the bed alarm several times which was seen by NT and another RN.  This RN said to patient that he had a recent fall and for his safety best to stay in bed, placed bedpan and be evaluated by PT. Also told the visitors not to touch the bed alarm.

## 2023-06-17 NOTE — Progress Notes (Addendum)
 Pharmacy Antibiotic Note  Brendan Preston is a 48 y.o. male admitted on 06/13/2023 with sepsis, found at home lethargic and not responsive, history of polysubstance abuse.  Treating MRSA bacteremia with suspected septic emboli.  TEE pending.  Pharmacy has been consulted for Vancomycin dosing.  AKI noted on admission.  No baseline available.  SCr improving 3.3 > 2.13 > 1.43 Tm 102.2.  WBC at 11.6  Plan: Vancomycin increase to 750 mg IV q12h  (assumes baseline SCr ~1, TBW < IBW, est AUC 437) Measure Vanc levels as needed.  Goal AUC 400-550.  Follow up renal function, culture results, and clinical course.   Height: 5\' 11"  (180.3 cm) Weight: 64.7 kg (142 lb 10.2 oz) IBW/kg (Calculated) : 75.3  Temp (24hrs), Avg:100.7 F (38.2 C), Min:99.3 F (37.4 C), Max:102.2 F (39 C)  Recent Labs  Lab 06/13/23 1345 06/13/23 1349 06/13/23 1652 06/13/23 1704 06/13/23 2034 06/14/23 0249 06/14/23 1048 06/15/23 0820 06/16/23 0853 06/16/23 0936 06/17/23 0300  WBC 15.3*  --   --   --   --  11.4*  --   --   --  11.6*  --   CREATININE  --   --    < >  --   --  3.19* 3.33* 2.92* 2.13*  --  1.43*  LATICACIDVEN  --  8.4*  --  8.0* 5.1*  --  2.2*  --   --   --   --   VANCORANDOM  --   --   --   --   --  17  --   --   --   --   --    < > = values in this interval not displayed.    Estimated Creatinine Clearance: 57.8 mL/min (A) (by C-G formula based on SCr of 1.43 mg/dL (H)).    Allergies  Allergen Reactions   Penicillins Other (See Comments)    Noted on prior chart. Rash to cefepime 06/13/23, but tolerated ceftriaxone in 2020. Mother is unsure if he is truly allergic to penicillin. (2025)   Cefepime Rash    Antimicrobials this admission: 3/7 cefepime x1 3/7 vancomycin >> 3/7 Rocephin >> 3/8   Dose adjustments this admission: 3/8 Vancomycin level (random ~12 hr level, drawn 02:49): 17, redose vanc.  3/10 empiric vanc renal adjustment 3/11 empiric vanc renal adjustment.    Microbiology  results: 3/7 Resp panel: negative covid, flu, rsv 3/7 BCx: 4/4 bottles Staph aureus 3/7 BCID:  MRSA 3/7 UCx: NGF  3/7 LP: unable to read d/t blood 3/7 Meningitis/Encephalitis Panel (CSF): none detected 3/9 BCx: ip  Thank you for allowing pharmacy to be a part of this patient's care.  Lynann Beaver PharmD, BCPS [facility] main pharmacy [phone] 06/17/2023 7:06 AM

## 2023-06-17 NOTE — Progress Notes (Signed)
 Advanced ETT 2cm per CCM order- ETT now resides at 26cm lip- RN aware.

## 2023-06-17 NOTE — Progress Notes (Signed)
Notified Lab that ABG being sent for analysis. RN aware. 

## 2023-06-17 NOTE — Procedures (Signed)
 Intubation Procedure Note  Wade Asebedo  [id number]  Jan 08, 1976  Date:06/17/23  Time:4:05 PM   Provider Performing:Gil Ingwersen A Kaylise Blakeley    Procedure: Intubation ([address]00)  Indication(s) Respiratory Failure  Consent Unable to obtain consent due to emergent nature of procedure.   Anesthesia Versed, Rocuronium, and ketamine 1 mg Versed, 60 mg rocuronium, 120 mg ketamine  Time Out Verified patient identification, verified procedure, site/side was marked, verified correct patient position, special equipment/implants available, medications/allergies/relevant history reviewed, required imaging and test results available.   Sterile Technique Usual hand hygeine, masks, and gloves were used   Procedure Description Patient positioned in bed supine.  Sedation given as noted above.  Patient was intubated with endotracheal tube using Glidescope.  View was Grade 1 full glottis .  Number of attempts was 1.  Colorimetric CO2 detector was consistent with tracheal placement.   Complications/Tolerance None; patient tolerated the procedure well. Chest X-ray is ordered to verify placement.   EBL Minimal   Specimen(s) None

## 2023-06-17 NOTE — Progress Notes (Signed)
 [facility]  Date of Admission:  06/13/2023     Reason for Follow Up: Severe sepsis (HCC)  Total days of antibiotics 5         ASSESSMENT:  Brendan Preston continues to remain febrile in the setting of MRSA bacteremia with suspected septic emboli and concern for endocarditis. Blood cultures from 06/15/23 are without growth in 1 day. TEE deferred per Brendan Preston. Fever curve appears to be slowly down trending. Discussed plan of care to continue current dose of vancomycin. Continue therapeutic drug monitoring of renal function and vancomycin levels. Renal function improving with creatinine 1.43 and eGFR >60. Continue contact precautions for MRSA. Anticipate will need a prolonged course of treatment and not a good candidate for PICC line.  Monitor cultures for clearance of bacteremia. Remaining medical and supportive care per PCCM.   PLAN:  Continue current dose of vancomycin.  Monitor blood cultures for clearance of bacteremia.  TEE when agreeable to check for endocarditis and valve function.  Therapeutic drug monitoring of renal function and vancomycin levels per protocol. Remaining medical and supportive care per PCCM.   Principal Problem:   Severe sepsis (HCC) Active Problems:   MRSA bacteremia   Septic embolism (HCC)    Chlorhexidine Gluconate Cloth  6 each Topical Daily   cloNIDine  0.1 mg Oral BID   heparin  5,000 Units Subcutaneous Q8H   insulin aspart  0-9 Units Subcutaneous Q4H   senna-docusate  2 tablet Oral BID   thiamine (VITAMIN B1) injection  100 mg Intravenous Daily    SUBJECTIVE:  Continues to remain febrile with temperature of 101.7 F overnight and stable leukocytosis with white blood cell count of 11,600.  Allergies  Allergen Reactions   Penicillins Other (See Comments)    Noted on prior chart. Rash to cefepime 06/13/23, but tolerated ceftriaxone in 2020. Mother is unsure if he is truly allergic to penicillin. (2025)   Cefepime Rash      Review of Systems: Review of Systems  Constitutional:  Negative for chills, fever and weight loss.  Respiratory:  Negative for cough, shortness of breath and wheezing.   Cardiovascular:  Negative for chest pain and leg swelling.  Gastrointestinal:  Negative for abdominal pain, constipation, diarrhea, nausea and vomiting.  Skin:  Negative for rash.      OBJECTIVE: Vitals:   06/17/23 1100 06/17/23 1200 06/17/23 1300 06/17/23 1400  BP: (!) 145/96 (!) 164/102 (!) 157/98 (!) 154/97  Pulse: 99 (!) 47 (!) 110   Resp: (!) 48 (!) 49 (!) 34 (!) 37  Temp: (!) 100.4 F (38 C) (!) 100.6 F (38.1 C) (!) 100.4 F (38 C) 99.9 F (37.7 C)  TempSrc:      SpO2: (!) 85% 100% 93%   Weight:      Height:       Body mass index is 19.89 kg/m.  Physical Exam Constitutional:      General: He is not in acute distress.    Appearance: He is well-developed. He is ill-appearing and diaphoretic.  Cardiovascular:     Rate and Rhythm: Normal rate and regular rhythm.     Heart sounds: Normal heart sounds.  Pulmonary:     Effort: Tachypnea and accessory muscle usage present.     Breath sounds: Wheezing and rhonchi present.  Skin:    General: Skin is warm.  Neurological:     Mental Status: He is alert and oriented to person, place, and time.  Lab Results Lab Results  Component Value Date   WBC 11.6 (H) 06/16/2023   HGB 13.5 06/16/2023   HCT 42.3 06/16/2023   MCV 86.2 06/16/2023   PLT 113 (L) 06/16/2023    Lab Results  Component Value Date   CREATININE 1.43 (H) 06/17/2023   BUN 45 (H) 06/17/2023   NA 140 06/17/2023   K 3.6 06/17/2023   CL 106 06/17/2023   CO2 21 (L) 06/17/2023    Lab Results  Component Value Date   ALT 35 06/13/2023   AST 119 (H) 06/13/2023   ALKPHOS 64 06/13/2023   BILITOT 1.2 06/13/2023     Microbiology: Recent Results (from the past 240 hours)  Culture, blood (Routine X 2) w Reflex to ID Panel     Status: Abnormal   Collection Time: 06/13/23  1:49  PM   Specimen: BLOOD  Result Value Ref Range Status   Specimen Description   Final    BLOOD SITE NOT SPECIFIED Performed at [facility], [address] [place], [place] [address]    Special Requests   Final    BOTTLES DRAWN AEROBIC AND ANAEROBIC Blood Culture adequate volume Performed at [facility], [address], [place], [place] [address]    Culture  Setup Time   Final    [facility] POSITIVE COCCI IN CLUSTERS IN BOTH AEROBIC AND ANAEROBIC BOTTLES PHARMD E JACKSON 06/14/2023 @ 0551 BY AB Performed at [facility], [address], [place], [place] [address]    Culture METHICILLIN RESISTANT STAPHYLOCOCCUS AUREUS (A)  Final   Report Status 06/17/2023 FINAL  Final   Organism ID, Bacteria METHICILLIN RESISTANT STAPHYLOCOCCUS AUREUS  Final      Susceptibility   Methicillin resistant staphylococcus aureus - MIC*    CIPROFLOXACIN >=8 RESISTANT Resistant     ERYTHROMYCIN >=8 RESISTANT Resistant     GENTAMICIN <=0.5 SENSITIVE Sensitive     OXACILLIN >=4 RESISTANT Resistant     TETRACYCLINE <=1 SENSITIVE Sensitive     VANCOMYCIN 1 SENSITIVE Sensitive     TRIMETH/SULFA >=320 RESISTANT Resistant     CLINDAMYCIN <=0.25 SENSITIVE Sensitive     RIFAMPIN <=0.5 SENSITIVE Sensitive     Inducible Clindamycin NEGATIVE Sensitive     LINEZOLID 2 SENSITIVE Sensitive     * METHICILLIN RESISTANT STAPHYLOCOCCUS AUREUS  Blood Culture ID Panel (Reflexed)     Status: Abnormal   Collection Time: 06/13/23  1:49 PM  Result Value Ref Range Status   Enterococcus faecalis NOT DETECTED NOT DETECTED Final   Enterococcus Faecium NOT DETECTED NOT DETECTED Final   Listeria monocytogenes NOT DETECTED NOT DETECTED Final   Staphylococcus species DETECTED (A) NOT DETECTED Final    Comment: CRITICAL RESULT CALLED TO, READ BACK BY AND VERIFIED WITH: PHARMD E JACKSON 06/14/2023 @ 0551 BY AB    Staphylococcus aureus (BCID) DETECTED (A) NOT DETECTED Final    Comment: Methicillin  (oxacillin)-resistant Staphylococcus aureus (MRSA). MRSA is predictably resistant to beta-lactam antibiotics (except ceftaroline). Preferred therapy is vancomycin unless clinically contraindicated. Patient requires contact precautions if  hospitalized. CRITICAL RESULT CALLED TO, READ BACK BY AND VERIFIED WITH: PHARMD E JACKSON 06/14/2023 @ 0551 BY AB    Staphylococcus epidermidis NOT DETECTED NOT DETECTED Final   Staphylococcus lugdunensis NOT DETECTED NOT DETECTED Final   Streptococcus species NOT DETECTED NOT DETECTED Final   Streptococcus agalactiae NOT DETECTED NOT DETECTED Final   Streptococcus pneumoniae NOT DETECTED NOT DETECTED Final   Streptococcus pyogenes NOT DETECTED NOT DETECTED  Final   A.calcoaceticus-baumannii NOT DETECTED NOT DETECTED Final   Bacteroides fragilis NOT DETECTED NOT DETECTED Final   Enterobacterales NOT DETECTED NOT DETECTED Final   Enterobacter cloacae complex NOT DETECTED NOT DETECTED Final   Escherichia coli NOT DETECTED NOT DETECTED Final   Klebsiella aerogenes NOT DETECTED NOT DETECTED Final   Klebsiella oxytoca NOT DETECTED NOT DETECTED Final   Klebsiella pneumoniae NOT DETECTED NOT DETECTED Final   Proteus species NOT DETECTED NOT DETECTED Final   Salmonella species NOT DETECTED NOT DETECTED Final   Serratia marcescens NOT DETECTED NOT DETECTED Final   Haemophilus influenzae NOT DETECTED NOT DETECTED Final   Neisseria meningitidis NOT DETECTED NOT DETECTED Final   Pseudomonas aeruginosa NOT DETECTED NOT DETECTED Final   Stenotrophomonas maltophilia NOT DETECTED NOT DETECTED Final   Candida albicans NOT DETECTED NOT DETECTED Final   Candida auris NOT DETECTED NOT DETECTED Final   Candida glabrata NOT DETECTED NOT DETECTED Final   Candida krusei NOT DETECTED NOT DETECTED Final   Candida parapsilosis NOT DETECTED NOT DETECTED Final   Candida tropicalis NOT DETECTED NOT DETECTED Final   Cryptococcus neoformans/gattii NOT DETECTED NOT DETECTED Final    Meth resistant mecA/C and MREJ DETECTED (A) NOT DETECTED Final    Comment: CRITICAL RESULT CALLED TO, READ BACK BY AND VERIFIED WITH: PHARMD E JACKSON 06/14/2023 @ 0551 BY AB Performed at [facility], [address], [place], [place] [address]   Blood culture (routine x 2)     Status: Abnormal   Collection Time: 06/13/23  1:55 PM   Specimen: BLOOD RIGHT ARM  Result Value Ref Range Status   Specimen Description   Final    BLOOD RIGHT ARM Performed at [facility], [address] [place], [place] [address]    Special Requests   Final    BOTTLES DRAWN AEROBIC AND ANAEROBIC Blood Culture results may not be optimal due to an inadequate volume of blood received in culture bottles Performed at [facility], [address], [place], [place] [address]    Culture  Setup Time   Final    GRAM POSITIVE COCCI IN CLUSTERS IN BOTH AEROBIC AND ANAEROBIC BOTTLES CRITICAL VALUE NOTED.  VALUE IS CONSISTENT WITH PREVIOUSLY REPORTED AND CALLED VALUE.    Culture (A)  Final    STAPHYLOCOCCUS AUREUS SUSCEPTIBILITIES PERFORMED ON PREVIOUS CULTURE WITHIN THE LAST 5 DAYS. Performed at [facility], [address] [place], [place] [address]    Report Status 06/17/2023 FINAL  Final  Resp panel by RT-PCR (RSV, Flu A&B, Covid) Anterior Nasal Swab     Status: None   Collection Time: 06/13/23  1:56 PM   Specimen: Anterior Nasal Swab  Result Value Ref Range Status   SARS Coronavirus 2 by RT PCR NEGATIVE NEGATIVE Final    Comment: (NOTE) SARS-CoV-2 target nucleic acids are NOT DETECTED.  The SARS-CoV-2 RNA is generally detectable in upper respiratory specimens during the acute phase of infection. The lowest concentration of SARS-CoV-2 viral copies this assay can detect is 138 copies/mL. A negative result does not preclude SARS-Cov-2 infection and should not be used as the sole basis for treatment or other patient management decisions. A negative result may occur with   improper specimen collection/handling, submission of specimen other than nasopharyngeal swab, presence of viral mutation(s) within the areas targeted by this assay, and inadequate number of viral copies(<138 copies/mL). A negative result must be combined with clinical observations, patient history, and epidemiological information. The  expected result is Negative.  Fact Sheet for Patients:  BloggerCourse.com  Fact Sheet for Healthcare Providers:  SeriousBroker.it  This test is no t yet approved or cleared by the [place] FDA and  has been authorized for detection and/or diagnosis of SARS-CoV-2 by FDA under an Emergency Use Authorization (EUA). This EUA will remain  in effect (meaning this test can be used) for the duration of the COVID-19 declaration under Section 564(b)(1) of the Act, 21 U.S.C.section 360bbb-3(b)(1), unless the authorization is terminated  or revoked sooner.       Influenza A by PCR NEGATIVE NEGATIVE Final   Influenza B by PCR NEGATIVE NEGATIVE Final    Comment: (NOTE) The Xpert Xpress SARS-CoV-2/FLU/RSV plus assay is intended as an aid in the diagnosis of influenza from Nasopharyngeal swab specimens and should not be used as a sole basis for treatment. Nasal washings and aspirates are unacceptable for Xpert Xpress SARS-CoV-2/FLU/RSV testing.  Fact Sheet for Patients: BloggerCourse.com  Fact Sheet for Healthcare Providers: SeriousBroker.it  This test is not yet approved or cleared by the [place] FDA and has been authorized for detection and/or diagnosis of SARS-CoV-2 by FDA under an Emergency Use Authorization (EUA). This EUA will remain in effect (meaning this test can be used) for the duration of the COVID-19 declaration under Section 564(b)(1) of the Act, 21 U.S.C. section 360bbb-3(b)(1), unless the authorization is terminated or revoked.      Resp Syncytial Virus by PCR NEGATIVE NEGATIVE Final    Comment: (NOTE) Fact Sheet for Patients: BloggerCourse.com  Fact Sheet for Healthcare Providers: SeriousBroker.it  This test is not yet approved or cleared by the [place] FDA and has been authorized for detection and/or diagnosis of SARS-CoV-2 by FDA under an Emergency Use Authorization (EUA). This EUA will remain in effect (meaning this test can be used) for the duration of the COVID-19 declaration under Section 564(b)(1) of the Act, 21 U.S.C. section 360bbb-3(b)(1), unless the authorization is terminated or revoked.  Performed at [facility], [address], [place], [place] [address]   Urine Culture     Status: None   Collection Time: 06/13/23  2:27 PM   Specimen: Urine, Clean Catch  Result Value Ref Range Status   Specimen Description   Final    URINE, CLEAN CATCH Performed at [facility], [address], [place], [place] [address]    Special Requests   Final    NONE Performed at [facility], [address], [place], [place] [address]    Culture   Final    NO GROWTH Performed at [facility], [address], [place], [place] [address]    Report Status 06/14/2023 FINAL  Final  Culture, blood (Routine X 2) w Reflex to ID Panel     Status: None (Preliminary result)   Collection Time: 06/15/23 10:27 PM   Specimen: BLOOD RIGHT HAND  Result Value Ref Range Status   Specimen Description   Final    BLOOD RIGHT HAND Performed at [facility], [address] [place], [place] [address]    Special Requests   Final    BOTTLES DRAWN AEROBIC ONLY Blood Culture results may not be optimal due to an inadequate volume of blood received in culture bottles Performed at [facility], [address], [place], [place] [address]    Culture   Final    NO GROWTH 1 DAY Performed at [facility], [address], [place], [place] [address]    Report Status PENDING  Incomplete  Culture, blood (Routine X 2) w Reflex to ID Panel     Status: None (Preliminary result)   Collection Time: 06/15/23 10:27 PM   Specimen: BLOOD RIGHT HAND  Result Value Ref Range Status   Specimen Description   Final    BLOOD RIGHT HAND Performed at [facility], [address], [place], [place] [address]    Special Requests   Final    BOTTLES DRAWN AEROBIC ONLY Blood Culture results may not be optimal due to an inadequate volume of blood received in culture bottles Performed at [facility], [address] [place], [place] [address]    Culture   Final    NO GROWTH 1 DAY Performed at [facility], [address], [place], [place] [address]    Report Status PENDING  Incomplete     Brendan Eke, NP [facility]  06/17/2023  2:38 PM

## 2023-06-17 NOTE — Progress Notes (Signed)
 Called to patient's bedside  Tachypneic, tachycardic Sweaty  Reinitiated Precedex  Discussed the possible need for ventilator support if he continues to struggle with Precedex on board  Respiratory rate in the high 30s, low 40s Tachycardic  Patient's best friend at bedside trying to support him as best as possible  Unfortunately, patient is very sick and risk of decompensation is very high

## 2023-06-17 NOTE — Progress Notes (Signed)
 Heart Failure Navigator Progress Note  Following this hospitalization to assess for HV TOC readiness.   EF 40-45%, plan to follow and interview for Heart Failure TOC after potential TEE. Patient still febrile and plans for weaning Percedex.   Rhae Hammock, BSN, Scientist, clinical (histocompatibility and immunogenetics) Only

## 2023-06-17 NOTE — Plan of Care (Signed)
  Problem: Clinical Measurements: Goal: Will remain free from infection Outcome: Not Progressing Goal: Diagnostic test results will improve Outcome: Not Progressing Goal: Respiratory complications will improve Outcome: Not Progressing Goal: Cardiovascular complication will be avoided Outcome: Not Progressing   Problem: Activity: Goal: Risk for activity intolerance will decrease Outcome: Not Progressing   Problem: Coping: Goal: Level of anxiety will decrease Outcome: Not Progressing

## 2023-06-17 NOTE — Procedures (Signed)
Orogastric tube placed uneventfully  Tolerated well

## 2023-06-17 NOTE — Progress Notes (Signed)
 [facility] ADULT ICU REPLACEMENT PROTOCOL   The patient does apply for the [facility] Adult ICU Electrolyte Replacment Protocol based on the criteria listed below:   1.Exclusion criteria: TCTS, ECMO, Dialysis, and Myasthenia Gravis patients 2. Is GFR >/= 30 ml/min? Yes.    Patient's GFR today is >60 3. Is SCr </= 2? Yes.   Patient's SCr is 1.43 mg/dL 4. Did SCr increase >/= 0.5 in 24 hours? No. 5.Pt's weight >40kg  Yes.   6. Abnormal electrolyte(s): potassium 3.6  7. Electrolytes replaced per protocol 8.  Call MD STAT for K+ </= 2.5, Phos </= 1, or Mag </= 1 Physician:  protocol  Melvern Banker 06/17/2023 5:33 AM

## 2023-06-17 NOTE — Progress Notes (Signed)
 NAME:  Brendan Preston, MRN:  [id number], DOB:  March 27, 1976, LOS: 4 ADMISSION DATE:  06/13/2023, CONSULTATION DATE:  06/13/23 REFERRING MD:  Dr. Doran Durand, CHIEF COMPLAINT:  AMS   History of Present Illness:  Pt encephalopathic, therefore HPI obtained from chart review.  See additional chart w/MRN [id number]  46 yoM with prior hx of known polysubstance (heroin/ fentanyl) abuse in which EMS was called out for AMS, last seen awake 3 days ago covered in urine and feces.   In ER, temp 104.3 rectal, tachycardic, normo to slightly hypertensive, sats 100% on NRB> but maintaining sats on RA with GCS ~10 but protecting airway.  Labs significant for lactic 8.4, WBC 15, plts 96, VBG 7.29/ 44,  CXR neg, RVP neg, EKG without ST changes, qtc borderline, CTH official read pending, trop hs 694, INR 1.3.  Foley placed- urine cloudy, glucose 150, Hgb 150 (-RBC), neg leuks/nitrates, WBC 6-10, UDS positive for THC and opiates.  CMET and other labs had to be redrawn and still awaiting due to hemolysis.  Cultures sent, empiric vanc/ cefepime given, 2L LR.  PCCM called for admit.  Pertinent  Medical History  Tobacco abuse, polysubstance abuse/ IVDA, ETOH unclear  Significant Hospital Events: Including procedures, antibiotic start and stop dates in addition to other pertinent events   3/7 admit, encephalopathic, altered, drug rash developed after cefepime 3/8 TTE negative for endocarditis 3/10 he had a TEE scheduled but sounds like he has declined this . Larey Seat out of bed late day shift and broke his nose. Bcx still pos   Interim History / Subjective:  Issue with his visitor shutting off his bed alarms overnight   CK is up to 11000 Cr 1.43  CRP 30.2   Coughing better after starting flutter valve yesterday, feels better today than yesterday  Objective   Blood pressure (!) 151/95, pulse 92, temperature 99.3 F (37.4 C), resp. rate (!) 35, height 5\' 11"  (1.803 m), weight 64.7 kg, SpO2 91%.        Intake/Output  Summary (Last 24 hours) at 06/17/2023 0751 Last data filed at 06/17/2023 0600 Gross per 24 hour  Intake 1763.97 ml  Output 2100 ml  Net -336.03 ml   Filed Weights   06/15/23 0500 06/16/23 0500 06/17/23 0555  Weight: 74.1 kg 71.6 kg 64.7 kg    Examination: Gen: Thin acutely and chronically ill M NAD  ENT: His nose is swollen, there is some bruising. Poor dentition  CV: rr s1s2 cap refill < 3 sec  Resp: Even unlabored respirations, a bit shallow. Diffuse rhonchi  Abd:  thin ndnt MSK: c/d/w  Neuro: Awake alert. Oriented x 3. A bit of generalized weakness, but no focal def.   Resolved Hospital Problem list   Cefepime drug rash  Assessment & Plan:   Acute metabolic encephalopathy Polysubstance use disorder  -wean precedex. Started on clonidine 3/10 -cessation counseling -micronutrient support -delirium precautions   Severe sepsis MRSA Bacteremia  Acute hypoxic resp failure  Septic emboli -pt wants to defer TEE which was intended for 3/10 to eval for infective endocarditis. Cards is following.   -will adv diet as tolerated, will need to be NPO before eventual TEE  -ID is following -Bcx remain + as of 3/7 draw (resulted 3/10)  -vanc  -wean O2 for goal > 92 -IS, flutter, mobility   Mechanical Fall Traumatic comminuted nasal bone fractures -talked w Dr Jearld Fenton -- rec is office follow up 1 week  AKI Hypokalemia  NAGMA Rhabdo -follow renal indices  -  cont gentle IVF   Acute Biventricular dysfunction -LVEF 40-45% moderately reduced RV  -will have a TEE when he is agreeable, gen cards is involved for this   -HF navigator eval pending.   Best Practice (right click and "Reselect all SmartList Selections" daily)   Diet/type: clear liquids - can advance diet as tolerated based on mental status DVT prophylaxis prophylactic heparin  Pressure ulcer(s): N/A GI prophylaxis: N/A Lines: N/A Foley:  Yes, and it is still needed Code Status:  full code Last date of  multidisciplinary goals of care discussion [mother heather updated by phone 3/8. Pt updated 3/11     CRITICAL CARE Performed by: Lanier Clam   Total critical care time: 38 minutes  Critical care time was exclusive of separately billable procedures and treating other patients. Critical care was necessary to treat or prevent imminent or life-threatening deterioration.  Critical care was time spent personally by me on the following activities: development of treatment plan with patient and/or surrogate as well as nursing, discussions with consultants, evaluation of patient's response to treatment, examination of patient, obtaining history from patient or surrogate, ordering and performing treatments and interventions, ordering and review of laboratory studies, ordering and review of radiographic studies, pulse oximetry and re-evaluation of patient's condition.  Tessie Fass MSN, AGACNP-BC [facility] Pulmonary/Critical Care Medicine Amion for pager  06/17/2023, 10:46 AM

## 2023-06-18 ENCOUNTER — Inpatient Hospital Stay (HOSPITAL_COMMUNITY): Payer: MEDICAID

## 2023-06-18 DIAGNOSIS — E440 Moderate protein-calorie malnutrition: Secondary | ICD-10-CM

## 2023-06-18 DIAGNOSIS — D72829 Elevated white blood cell count, unspecified: Secondary | ICD-10-CM

## 2023-06-18 DIAGNOSIS — I2690 Septic pulmonary embolism without acute cor pulmonale: Secondary | ICD-10-CM

## 2023-06-18 LAB — GLUCOSE, CAPILLARY
Glucose-Capillary: 111 mg/dL — ABNORMAL HIGH (ref 70–99)
Glucose-Capillary: 122 mg/dL — ABNORMAL HIGH (ref 70–99)
Glucose-Capillary: 128 mg/dL — ABNORMAL HIGH (ref 70–99)
Glucose-Capillary: 130 mg/dL — ABNORMAL HIGH (ref 70–99)
Glucose-Capillary: 132 mg/dL — ABNORMAL HIGH (ref 70–99)
Glucose-Capillary: 138 mg/dL — ABNORMAL HIGH (ref 70–99)

## 2023-06-18 LAB — CBC
HCT: 38.9 % — ABNORMAL LOW (ref 39.0–52.0)
HCT: 39.4 % (ref 39.0–52.0)
Hemoglobin: 12.2 g/dL — ABNORMAL LOW (ref 13.0–17.0)
Hemoglobin: 12 g/dL — ABNORMAL LOW (ref 13.0–17.0)
MCH: 26.8 pg (ref 26.0–34.0)
MCH: 27.2 pg (ref 26.0–34.0)
MCHC: 31.4 g/dL (ref 30.0–36.0)
MCHC: 30.5 g/dL (ref 30.0–36.0)
MCV: 85.5 fL (ref 80.0–100.0)
MCV: 89.3 fL (ref 80.0–100.0)
Platelets: UNDETERMINED 10*3/uL (ref 150–400)
Platelets: 263 10*3/uL (ref 150–400)
RBC: 4.55 MIL/uL (ref 4.22–5.81)
RBC: 4.41 MIL/uL (ref 4.22–5.81)
RDW: 18.1 % — ABNORMAL HIGH (ref 11.5–15.5)
RDW: 18 % — ABNORMAL HIGH (ref 11.5–15.5)
WBC: 22.9 10*3/uL — ABNORMAL HIGH (ref 4.0–10.5)
WBC: 19.5 10*3/uL — ABNORMAL HIGH (ref 4.0–10.5)
nRBC: 0 % (ref 0.0–0.2)
nRBC: 0 % (ref 0.0–0.2)

## 2023-06-18 LAB — BASIC METABOLIC PANEL WITH GFR
Anion gap: 14 (ref 5–15)
BUN: 35 mg/dL — ABNORMAL HIGH (ref 6–20)
CO2: 21 mmol/L — ABNORMAL LOW (ref 22–32)
Calcium: 7.7 mg/dL — ABNORMAL LOW (ref 8.9–10.3)
Chloride: 108 mmol/L (ref 98–111)
Creatinine, Ser: 1.25 mg/dL — ABNORMAL HIGH (ref 0.61–1.24)
GFR, Estimated: >60 mL/min (ref >60)
Glucose, Bld: 134 mg/dL — ABNORMAL HIGH (ref 70–99)
Potassium: 3.7 mmol/L (ref 3.5–5.1)
Sodium: 143 mmol/L (ref 135–145)

## 2023-06-18 LAB — MAGNESIUM: Magnesium: 2.3 mg/dL (ref 1.7–2.4)

## 2023-06-18 LAB — CK: Total CK: 4468 U/L — ABNORMAL HIGH (ref 49–397)

## 2023-06-18 LAB — PHOSPHORUS: Phosphorus: 1.5 mg/dL — ABNORMAL LOW (ref 2.5–4.6)

## 2023-06-18 MED ORDER — FREE WATER
200.0000 mL | Freq: Three times a day (TID) | Status: DC
  Administered 2023-06-18 – 2023-06-26 (×22): 200 mL

## 2023-06-18 MED ORDER — CLONAZEPAM 0.5 MG PO TABS
0.5000 mg | ORAL_TABLET | Freq: Three times a day (TID) | ORAL | Status: DC | PRN
  Administered 2023-06-18 – 2023-06-19 (×4): 0.5 mg
  Filled 2023-06-18 (×4): qty 1

## 2023-06-18 MED ORDER — POTASSIUM CHLORIDE 20 MEQ PO PACK
40.0000 meq | PACK | Freq: Once | ORAL | Status: AC
  Administered 2023-06-18: 40 meq
  Filled 2023-06-18: qty 2

## 2023-06-18 MED ORDER — METHOCARBAMOL 500 MG PO TABS
500.0000 mg | ORAL_TABLET | Freq: Three times a day (TID) | ORAL | Status: DC | PRN
  Filled 2023-06-18: qty 1

## 2023-06-18 MED ORDER — OXYCODONE HCL 5 MG PO TABS: 10.0000 mg | ORAL_TABLET | Freq: Four times a day (QID) | ORAL | Status: DC | PRN

## 2023-06-18 MED ORDER — SENNOSIDES-DOCUSATE SODIUM 8.6-50 MG PO TABS
2.0000 | ORAL_TABLET | Freq: Two times a day (BID) | ORAL | Status: DC
  Administered 2023-06-18 – 2023-06-29 (×12): 2
  Filled 2023-06-18 (×14): qty 2

## 2023-06-18 MED ORDER — FENTANYL CITRATE PF 50 MCG/ML IJ SOSY
50.0000 ug | PREFILLED_SYRINGE | INTRAMUSCULAR | Status: AC | PRN
  Administered 2023-06-18 (×2): 150 ug via INTRAVENOUS
  Administered 2023-06-18: 100 ug via INTRAVENOUS
  Administered 2023-06-18: 150 ug via INTRAVENOUS
  Administered 2023-06-19 (×2): 50 ug via INTRAVENOUS
  Administered 2023-06-19: 150 ug via INTRAVENOUS
  Administered 2023-06-19: 50 ug via INTRAVENOUS
  Administered 2023-06-20: 100 ug via INTRAVENOUS
  Filled 2023-06-18: qty 3
  Filled 2023-06-18: qty 2
  Filled 2023-06-18: qty 3
  Filled 2023-06-18: qty 2
  Filled 2023-06-18: qty 3
  Filled 2023-06-18 (×2): qty 1
  Filled 2023-06-18 (×2): qty 3

## 2023-06-18 MED ORDER — LIDOCAINE 5 % EX PTCH
1.0000 | MEDICATED_PATCH | CUTANEOUS | Status: DC
  Administered 2023-06-18 – 2023-07-11 (×19): 1 via TRANSDERMAL
  Filled 2023-06-18 (×24): qty 1

## 2023-06-18 MED ORDER — VITAL 1.5 CAL PO LIQD
1000.0000 mL | ORAL | Status: DC
  Administered 2023-06-18 – 2023-06-26 (×7): 1000 mL
  Filled 2023-06-18 (×14): qty 1000

## 2023-06-18 MED ORDER — SODIUM PHOSPHATES 45 MMOLE/15ML IV SOLN
45.0000 mmol | Freq: Once | INTRAVENOUS | Status: AC
  Administered 2023-06-18: 45 mmol via INTRAVENOUS
  Filled 2023-06-18: qty 15

## 2023-06-18 MED ORDER — PROSOURCE TF20 ENFIT COMPATIBL EN LIQD
60.0000 mL | Freq: Every day | ENTERAL | Status: DC
  Administered 2023-06-18 – 2023-06-30 (×13): 60 mL
  Filled 2023-06-18 (×13): qty 60

## 2023-06-18 MED ORDER — HYDROMORPHONE HCL 2 MG PO TABS
1.0000 mg | ORAL_TABLET | Freq: Four times a day (QID) | ORAL | Status: DC
  Administered 2023-06-18 – 2023-06-20 (×8): 1 mg
  Filled 2023-06-18 (×9): qty 1

## 2023-06-18 MED ORDER — POLYETHYLENE GLYCOL 3350 17 G PO PACK
17.0000 g | PACK | Freq: Every day | ORAL | Status: DC
  Administered 2023-06-21: 17 g
  Filled 2023-06-18 (×3): qty 1

## 2023-06-18 MED ORDER — PANTOPRAZOLE SODIUM 40 MG IV SOLR
40.0000 mg | INTRAVENOUS | Status: DC
  Administered 2023-06-18 – 2023-06-30 (×13): 40 mg via INTRAVENOUS
  Filled 2023-06-18 (×14): qty 10

## 2023-06-18 NOTE — Progress Notes (Signed)
 PT Cancellation Note  Patient Details Name: Brendan Preston MRN: [id number] DOB: 09-27-1975   Cancelled Treatment:    Reason Eval/Treat Not Completed: Medical issues which prohibited therapy Patient on ventilator now. [name] PT Acute Rehabilitation Services Office [phone]    [name] 06/18/2023, 7:37 AM

## 2023-06-18 NOTE — Progress Notes (Addendum)
 NAME:  Brendan Preston, MRN:  [id number], DOB:  1976/01/07, LOS: 5 ADMISSION DATE:  06/13/2023, CONSULTATION DATE:  06/13/23 REFERRING MD:  Dr. Doran Durand, CHIEF COMPLAINT:  AMS   History of Present Illness:  Pt encephalopathic, therefore HPI obtained from chart review.  See additional chart w/MRN [id number]  8 yoM with prior hx of known polysubstance (heroin/ fentanyl) abuse in which EMS was called out for AMS, last seen awake 3 days ago covered in urine and feces.   In ER, temp 104.3 rectal, tachycardic, normo to slightly hypertensive, sats 100% on NRB> but maintaining sats on RA with GCS ~10 but protecting airway.  Labs significant for lactic 8.4, WBC 15, plts 96, VBG 7.29/ 44,  CXR neg, RVP neg, EKG without ST changes, qtc borderline, CTH official read pending, trop hs 694, INR 1.3.  Foley placed- urine cloudy, glucose 150, Hgb 150 (-RBC), neg leuks/nitrates, WBC 6-10, UDS positive for THC and opiates.  CMET and other labs had to be redrawn and still awaiting due to hemolysis.  Cultures sent, empiric vanc/ cefepime given, 2L LR.  PCCM called for admit.  Pertinent  Medical History  Tobacco abuse, polysubstance abuse/ IVDA, ETOH unclear  Significant Hospital Events: Including procedures, antibiotic start and stop dates in addition to other pertinent events   3/7 admit, encephalopathic, altered, drug rash developed after cefepime 3/8 TTE negative for endocarditis 3/10 he had a TEE scheduled but sounds like he has declined this . Larey Seat out of bed late day shift and broke his nose. Bcx still pos. 3/11 Tubed in afternoon. CRP high.  3/12 changing his pain reg/ PAD  Interim History / Subjective:   Maintained on periph pressors overnight, not sure why these weren't weaned, it looks like his MAPs have been quite high.    Temps 102 overnight, down to 100.6 now  WBC up to 22   C/o some back pain   Objective   Blood pressure (!) 144/92, pulse 73, temperature (!) 100.4 F (38 C), resp. rate (!)  31, height (P) 5\' 11"  (1.803 m), weight 62.5 kg, SpO2 93%.    Vent Mode: PRVC FiO2 (%):  [40 %-100 %] 45 % Set Rate:  [16 bmp] 16 bmp Vt Set:  [600 mL] 600 mL PEEP:  [5 cmH20] 5 cmH20 Plateau Pressure:  [21 cmH20-31 cmH20] 26 cmH20   Intake/Output Summary (Last 24 hours) at 06/18/2023 0933 Last data filed at 06/18/2023 4403 Gross per 24 hour  Intake 2479.7 ml  Output 2000 ml  Net 479.7 ml   Filed Weights   06/16/23 0500 06/17/23 0555 06/18/23 0406  Weight: 71.6 kg 64.7 kg 62.5 kg    Examination: Gen: critically and chronically ill middle aged M NAD intubated  ENT:  ETT secure anicteric sclera. Nose is bruised and swollen  CV: rrr cap refill < 3 sec  Resp: Mechanically ventilated. Symmetrical chest expansion. Diffuse rhonchi  Abd:  thin ndnt  MSK: c/d/w  Neuro:  Awake alert following commands   Resolved Hospital Problem list   Cefepime drug rash  Assessment & Plan:    Acute metabolic encephalopathy Polysubstance use disorder Opiate dependence  -add enteral dilaudid SCH, cont PRN oxy. Incr range on PRN fent. Add PRN robaxin. Add lido patch -cont precedex gtt -RASS goal 0 to -1  -delirium precautions, frequent reorientation.   Acute respiratory failure w hypoxia -septic emboli, presumed MRSA PNA P -cont MV support -wean as able, suspect we are a ways away from extubation  -VAP, pulm hygiene  -  send trach asp -vanc as below   Septic shock  MRSA bacteremia Septic emboli, presumed MRSA PNA -pt wants to defer TEE which was intended for 3/10 to eval for infective endocarditis until his mother gets into town  P -wean periph Neo for MAP 65 -- hoping we can avoid central access at least while his bcx are still pos  -cont vanc -sending trach asp as above -ID following  -f/u cx -re-enagage cards when mother is in town so we can plan TEE schedule   Mechanical Fall Traumatic comminuted nasal bone fractures -talked w Dr Jearld Fenton -- rec is office follow up 1 week. I have  not scheduled this as I anticipate he will still be in the hospital   AKI, improving  Hypokalemia  Rhabdo, improving  NAGMA  -follow renal indices  -follow CK PRN now that it is down trending  Acute Biventricular dysfunction -LVEF 40-45% moderately reduced RV  -will have a TEE when he is agreeable, gen cards is involved for this    Inadequate PO intake P - EN per RDN  -will also add FWF 200cc q8 hr   Best Practice (right click and "Reselect all SmartList Selections" daily)   Diet/type: clear liquids - can advance diet as tolerated based on mental status DVT prophylaxis prophylactic heparin  Pressure ulcer(s): N/A GI prophylaxis: PPI Lines: N/A Foley:  Yes, and it is still needed Code Status:  full code Last date of multidisciplinary goals of care discussion [mother heather updated by phone 3/8. Pt updated 3/12    CRITICAL CARE Performed by: Lanier Clam   Total critical care time: 48 minutes  Critical care time was exclusive of separately billable procedures and treating other patients. Critical care was necessary to treat or prevent imminent or life-threatening deterioration.  Critical care was time spent personally by me on the following activities: development of treatment plan with patient and/or surrogate as well as nursing, discussions with consultants, evaluation of patient's response to treatment, examination of patient, obtaining history from patient or surrogate, ordering and performing treatments and interventions, ordering and review of laboratory studies, ordering and review of radiographic studies, pulse oximetry and re-evaluation of patient's condition.  Tessie Fass MSN, AGACNP-BC [facility] Pulmonary/Critical Care Medicine Amion for pager  06/18/2023, 9:33 AM

## 2023-06-18 NOTE — Progress Notes (Signed)
 [facility]    Date of Admission:  06/13/2023   Total days of antibiotics 6/vanco   ID: Brendan Preston is a 48 y.o. male with  MRSA bacteremia with pulmonary septic emboli respiratory distress requiring intubation Principal Problem:   Severe sepsis (HCC) Active Problems:   MRSA bacteremia   Septic embolism (HCC)   Malnutrition of moderate degree    Subjective: Intubated yesterday late afternoon. Still febrile  Leukoctyosis up to 23K, but cr continues to improve. CK trending downward. Blood cx NGTD at 48hr  Medications:   Chlorhexidine Gluconate Cloth  6 each Topical Daily   feeding supplement (PROSource TF20)  60 mL Per Tube Daily   free water  200 mL Per Tube Q8H   heparin  5,000 Units Subcutaneous Q8H   HYDROmorphone  1 mg Per Tube Q6H   insulin aspart  0-9 Units Subcutaneous Q4H   lidocaine  1 patch Transdermal Q24H   mouth rinse  15 mL Mouth Rinse Q2H   pantoprazole (PROTONIX) IV  40 mg Intravenous Q24H   polyethylene glycol  17 g Per Tube Daily   senna-docusate  2 tablet Per Tube BID   thiamine (VITAMIN B1) injection  100 mg Intravenous Daily    Objective: Vital signs in last 24 hours: Temp:  [99.3 F (37.4 C)-102 F (38.9 C)] 100.6 F (38.1 C) (03/12 1115) Pulse Rate:  [56-136] 79 (03/12 1115) Resp:  [7-37] 20 (03/12 1115) BP: (76-157)/(48-121) 125/79 (03/12 1115) SpO2:  [93 %-100 %] 97 % (03/12 1115) FiO2 (%):  [40 %-100 %] 45 % (03/12 1144) Weight:  [62.5 kg] 62.5 kg (03/12 0406) Physical Exam  Constitutional: intubated. He appears well-developed and well-nourished. No distress.  HENT:  Mouth/Throat: OETT in place Cardiovascular: tachy, regular rhythm and normal heart sounds. Exam reveals no gallop and no friction rub.  No murmur heard.  Pulmonary/Chest: Effort normal and breath sounds normal. No respiratory distress. Bialteral rhonchi Abdominal: Soft. Bowel sounds are normal. He exhibits no distension. There is no tenderness.   Skin: Skin is warm and dry. No rash noted. No erythema.     Lab Results Recent Labs    06/16/23 0936 06/17/23 0300 06/18/23 0338  WBC 11.6*  --  22.9*  HGB 13.5  --  12.2*  HCT 42.3  --  38.9*  NA  --  140 143  K  --  3.6 3.7  CL  --  106 108  CO2  --  21* 21*  BUN  --  45* 35*  CREATININE  --  1.43* 1.25*   Liver Panel No results for input(s): "PROT", "ALBUMIN", "AST", "ALT", "ALKPHOS", "BILITOT", "BILIDIR", "IBILI" in the last 72 hours. Sedimentation Rate No results for input(s): "ESRSEDRATE" in the last 72 hours. C-Reactive Protein Recent Labs    06/17/23 0300  CRP 30.2*    Microbiology: reviewed Studies/Results: DG CHEST PORT 1 VIEW Result Date: 06/18/2023 CLINICAL DATA:  Respiratory EXAM: PORTABLE CHEST 1 VIEW COMPARISON:  06/17/2023 FINDINGS: Cardiac shadow is stable. Endotracheal tube and gastric catheter are again seen in satisfactory position. Lungs are well aerated bilaterally with some persistent nodular opacities similar to that seen on prior exams. No new focal abnormality is noted. IMPRESSION: Stable patchy airspace opacities bilaterally. Electronically Signed   By: Alcide Clever M.D.   On: 06/18/2023 10:40   DG Abd 1 View Result Date: 06/17/2023 CLINICAL DATA:  Orogastric tube placement. EXAM: ABDOMEN - 1 VIEW COMPARISON:  Earlier today FINDINGS: The enteric  tube has been advanced, the side port is now below the diaphragm in the stomach. Similar gaseous colonic distension. IMPRESSION: Enteric tube has been advanced, side port now below the diaphragm in the stomach. Electronically Signed   By: Narda Rutherford M.D.   On: 06/17/2023 20:24   DG CHEST PORT 1 VIEW Result Date: 06/17/2023 CLINICAL DATA:  Intubation and OG tube placement. EXAM: PORTABLE CHEST 1 VIEW, ABDOMEN ONE VIEW COMPARISON:  06/16/2023. FINDINGS: The heart size and mediastinal contours are stable. Patchy airspace disease is present in the lungs bilaterally. There are small bilateral pleural  effusions. No pneumothorax is seen. The endotracheal tube terminates 7.2 cm above the carina. An enteric tube terminates in the region of the gastroesophageal junction and should be advanced 9 cm. Gas-filled loops of colon are noted in the upper abdomen. IMPRESSION: 1. Enteric tube terminates in the region of the gastroesophageal junction and should be advanced 9 cm. 2. Stable patchy airspace disease in the lungs. 3. Small bilateral pleural effusions. Electronically Signed   By: Thornell Sartorius M.D.   On: 06/17/2023 19:15   DG Abd 1 View Result Date: 06/17/2023 CLINICAL DATA:  Intubation and OG tube placement. EXAM: PORTABLE CHEST 1 VIEW, ABDOMEN ONE VIEW COMPARISON:  06/16/2023. FINDINGS: The heart size and mediastinal contours are stable. Patchy airspace disease is present in the lungs bilaterally. There are small bilateral pleural effusions. No pneumothorax is seen. The endotracheal tube terminates 7.2 cm above the carina. An enteric tube terminates in the region of the gastroesophageal junction and should be advanced 9 cm. Gas-filled loops of colon are noted in the upper abdomen. IMPRESSION: 1. Enteric tube terminates in the region of the gastroesophageal junction and should be advanced 9 cm. 2. Stable patchy airspace disease in the lungs. 3. Small bilateral pleural effusions. Electronically Signed   By: Thornell Sartorius M.D.   On: 06/17/2023 19:15   CT HEAD WO CONTRAST ( ) Result Date: 06/17/2023 CLINICAL DATA:  Head trauma EXAM: CT HEAD WITHOUT CONTRAST TECHNIQUE: Contiguous axial images were obtained from the base of the skull through the vertex without intravenous contrast. RADIATION DOSE REDUCTION: This exam was performed according to the departmental dose-optimization program which includes automated exposure control, adjustment of the mA and/or kV according to patient size and/or use of iterative reconstruction technique. COMPARISON:  06/13/2023 FINDINGS: Brain: No mass,hemorrhage or extra-axial  collection. Normal appearance of the parenchyma and CSF spaces. Vascular: No hyperdense vessel or unexpected vascular calcification. Skull: Comminuted nasal bone fractures. Sinuses/Orbits: No fluid levels or advanced mucosal thickening of the visualized paranasal sinuses. No mastoid or middle ear effusion. Normal orbits. Other: Motion degraded study. IMPRESSION: 1. No acute intracranial abnormality. 2. Comminuted nasal bone fractures. Electronically Signed   By: Deatra Robinson M.D.   On: 06/17/2023 01:36   DG CHEST PORT 1 VIEW Result Date: 06/16/2023 CLINICAL DATA:  Abnormal respirations. EXAM: PORTABLE CHEST 1 VIEW COMPARISON:  Radiograph 06/14/2023.  CT 06/15/2023 FINDINGS: Slight progression in multifocal bilateral lung opacities. Dependent atelectasis in the lung bases. No significant pleural effusion. Stable heart size and mediastinal contours. No pneumothorax. IMPRESSION: Slight progression in multifocal bilateral lung opacities. Electronically Signed   By: Narda Rutherford M.D.   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 Agree with plan for possible TEE tomorrow.  Aki = improving, will check vanco level tomorrow to see if need to adjust abtx dosage  Respiratory distress requiring ventilatory support =  defer to PCCM team for management   Leukocytosis = likely leukamoid reaction.  [facility] for Infectious [facility] Pager: [phone]  06/18/2023, 12:53 PM

## 2023-06-18 NOTE — Progress Notes (Signed)
 D/w cards  Possible TEE tomorrow morning  Follow plt -- ordered for 1700 and 0500 cbc  NPO at [place] order placed   Tessie Fass MSN, AGACNP-BC [facility] Pulmonary/Critical Care Medicine 06/18/2023, 3:45 PM

## 2023-06-18 NOTE — Progress Notes (Addendum)
 Met w pts parents at the bedside. Pt intubated, but awake and engaged throughout. Writing at times.   Talked about clinical updates. Mom asked if TEE was happening this afternoon, states she told the night nurse to schedule it for this afternoon. Politely explained that pt had declined previously scheduled TEE, wanting to wait for her arrival prior to moving forward. Discussed that cardiology will need to be re-engaged for TEE planing, and timing will depend on pt stability (now intubated, last cards saw him he was not) + cards schedule/if this can be done at [facility].   All questions answered    Will page cards    Tessie Fass MSN, AGACNP-BC [facility] Pulmonary/Critical Care Medicine  06/18/2023, 2:00 PM

## 2023-06-18 NOTE — Progress Notes (Signed)
 Initial Nutrition Assessment  DOCUMENTATION CODES:   Non-severe (moderate) malnutrition in context of chronic illness  INTERVENTION:  - Per CCM can initiate TF today. Recs as below: Initiate tube feeding via OGT: Vital 1.5 at 55 ml/h (1320 ml per day) *Start at 4mL/hr and advance by 10mL Q12H Prosource TF20 60 ml daily Provides 2060 kcal, 109 gm protein, 1006 ml free water daily  - Monitor magnesium, potassium, and phosphorus BID for at least 3 days, MD to replete as needed, as pt may be at risk for refeeding syndrome.  - FWF per CCM.  - Monitor weight trends.   NUTRITION DIAGNOSIS:   Moderate Malnutrition related to chronic illness (polysubstance abuse) as evidenced by mild fat depletion, mild muscle depletion.  GOAL:   Patient will meet greater than or equal to 90% of their needs  MONITOR:   Vent status, Labs, Weight trends, TF tolerance  REASON FOR ASSESSMENT:   Ventilator    ASSESSMENT:   48 yo male with prior hx of known polysubstance (heroin/ fentanyl) abuse admitted for AMS.  3/7 Admit 3/11 Intubated; OGT placed  Patient is currently intubated on ventilator support MV: 17.1 L/min Temp (24hrs), Avg:100.7 F (38.2 C), Min:99.3 F (37.4 C), Max:102 F (38.9 C)  No family at bedside at time of visit.  Patient remains on Neo but per discussion with RN weaning patient down.   Discussed patient with CCM NP. Can start tube feeds today. OGT xray verified in the stomach.  Given unknown intake PTA and signs of fat/muscle wasting, will start at 20mL/hr and advance slowly with close monitoring for electrolytes. Discussed plan with RN.   Admit weight: 147# Current weight: 137# I&O': +7.5L  Medications reviewed and include: Senokot, 100mg  thiamine Precedex Neo @ 25 mcg/min  Labs reviewed:  Creatinine 1.25 HA1C 5.9   NUTRITION - FOCUSED PHYSICAL EXAM:  Flowsheet Row Most Recent Value  Orbital Region Moderate depletion  Upper Arm Region No depletion   Thoracic and Lumbar Region Mild depletion  Buccal Region Unable to assess  Temple Region Severe depletion  Clavicle Bone Region Mild depletion  Clavicle and Acromion Bone Region Mild depletion  Scapular Bone Region Unable to assess  Dorsal Hand No depletion  Patellar Region No depletion  Anterior Thigh Region No depletion  Posterior Calf Region No depletion  Edema (RD Assessment) None  Hair Reviewed  Eyes Unable to assess  Mouth Unable to assess  Skin Reviewed  Nails Reviewed       Diet Order:   Diet Order             Diet NPO time specified Except for: Sips with Meds  Diet effective midnight                   EDUCATION NEEDS:  Not appropriate for education at this time  Skin:  Skin Assessment: Reviewed RN Assessment  Last BM:  PTA  Height:  Ht Readings from Last 1 Encounters:  06/17/23 (P) 5\' 11"  (1.803 m)   Weight:  Wt Readings from Last 1 Encounters:  06/18/23 62.5 kg    BMI:  Body mass index is 19.22 kg/m (pended).  Estimated Nutritional Needs:  Kcal:  1900-2200 kcals Protein:  95-115 grams Fluid:  >/= 1.9L    Shelle Iron RD, LDN Contact via Secure Chat.

## 2023-06-19 ENCOUNTER — Inpatient Hospital Stay (HOSPITAL_COMMUNITY): Payer: MEDICAID

## 2023-06-19 ENCOUNTER — Other Ambulatory Visit: Payer: Self-pay

## 2023-06-19 DIAGNOSIS — A4902 Methicillin resistant Staphylococcus aureus infection, unspecified site: Secondary | ICD-10-CM

## 2023-06-19 DIAGNOSIS — J9601 Acute respiratory failure with hypoxia: Secondary | ICD-10-CM

## 2023-06-19 DIAGNOSIS — R6521 Severe sepsis with septic shock: Secondary | ICD-10-CM

## 2023-06-19 LAB — CBC
HCT: 29.5 % — ABNORMAL LOW (ref 39.0–52.0)
Hemoglobin: 9.7 g/dL — ABNORMAL LOW (ref 13.0–17.0)
MCH: 27.3 pg (ref 26.0–34.0)
MCHC: 32.9 g/dL (ref 30.0–36.0)
MCV: 83.1 fL (ref 80.0–100.0)
Platelets: 342 10*3/uL (ref 150–400)
RBC: 3.55 MIL/uL — ABNORMAL LOW (ref 4.22–5.81)
RDW: 17.5 % — ABNORMAL HIGH (ref 11.5–15.5)
WBC: 16.1 10*3/uL — ABNORMAL HIGH (ref 4.0–10.5)
nRBC: 0 % (ref 0.0–0.2)

## 2023-06-19 LAB — GLUCOSE, CAPILLARY
Glucose-Capillary: 113 mg/dL — ABNORMAL HIGH (ref 70–99)
Glucose-Capillary: 120 mg/dL — ABNORMAL HIGH (ref 70–99)
Glucose-Capillary: 121 mg/dL — ABNORMAL HIGH (ref 70–99)
Glucose-Capillary: 128 mg/dL — ABNORMAL HIGH (ref 70–99)
Glucose-Capillary: 128 mg/dL — ABNORMAL HIGH (ref 70–99)
Glucose-Capillary: 164 mg/dL — ABNORMAL HIGH (ref 70–99)

## 2023-06-19 LAB — VANCOMYCIN, TROUGH: Vancomycin Tr: 18 ug/mL (ref 15–20)

## 2023-06-19 LAB — MAGNESIUM
Magnesium: 2.1 mg/dL (ref 1.7–2.4)
Magnesium: 2.1 mg/dL (ref 1.7–2.4)
Magnesium: 2 mg/dL (ref 1.7–2.4)

## 2023-06-19 LAB — PHOSPHORUS
Phosphorus: 3.7 mg/dL (ref 2.5–4.6)
Phosphorus: 3.2 mg/dL (ref 2.5–4.6)

## 2023-06-19 LAB — VANCOMYCIN, PEAK: Vancomycin Pk: 26 ug/mL — ABNORMAL LOW (ref 30–40)

## 2023-06-19 MED ORDER — FENTANYL 2500MCG IN NS 250ML (10MCG/ML) PREMIX INFUSION
0.0000 ug/h | INTRAVENOUS | Status: AC
  Administered 2023-06-19: 25 ug/h via INTRAVENOUS
  Administered 2023-06-20: 125 ug/h via INTRAVENOUS
  Administered 2023-06-21 (×3): 200 ug/h via INTRAVENOUS
  Administered 2023-06-22: 175 ug/h via INTRAVENOUS
  Administered 2023-06-22: 200 ug/h via INTRAVENOUS
  Filled 2023-06-19 (×6): qty 250

## 2023-06-19 MED ORDER — SODIUM CHLORIDE 0.9% FLUSH: 3.0000 mL | INTRAVENOUS | Status: DC | PRN

## 2023-06-19 MED ORDER — SODIUM CHLORIDE 0.9% FLUSH
3.0000 mL | Freq: Two times a day (BID) | INTRAVENOUS | Status: DC
  Administered 2023-06-19: 3 mL via INTRAVENOUS
  Administered 2023-06-20 – 2023-06-27 (×16): 10 mL via INTRAVENOUS
  Administered 2023-06-28: 3 mL via INTRAVENOUS
  Administered 2023-06-28 – 2023-06-29 (×3): 10 mL via INTRAVENOUS
  Administered 2023-06-30: 5 mL via INTRAVENOUS
  Administered 2023-06-30: 3 mL via INTRAVENOUS
  Administered 2023-07-01 (×2): 10 mL via INTRAVENOUS
  Administered 2023-07-02: 3 mL via INTRAVENOUS
  Administered 2023-07-02: 5 mL via INTRAVENOUS
  Administered 2023-07-03: 3 mL via INTRAVENOUS
  Administered 2023-07-03 – 2023-07-04 (×2): 10 mL via INTRAVENOUS
  Administered 2023-07-05: 5 mL via INTRAVENOUS
  Administered 2023-07-05: 10 mL via INTRAVENOUS

## 2023-06-19 MED ORDER — GERHARDT'S BUTT CREAM
TOPICAL_CREAM | Freq: Three times a day (TID) | CUTANEOUS | Status: AC | PRN
  Filled 2023-06-19: qty 60

## 2023-06-19 MED ORDER — SODIUM CHLORIDE 0.9% FLUSH
10.0000 mL | Freq: Two times a day (BID) | INTRAVENOUS | Status: DC
  Administered 2023-06-19 – 2023-06-20 (×3): 10 mL
  Administered 2023-06-20: 30 mL
  Administered 2023-06-21 – 2023-06-22 (×4): 10 mL
  Administered 2023-06-23: 20 mL
  Administered 2023-06-23: 10 mL
  Administered 2023-06-24: 20 mL
  Administered 2023-06-24: 30 mL
  Administered 2023-06-25 – 2023-06-26 (×2): 20 mL
  Administered 2023-06-26: 10 mL
  Administered 2023-06-26: 20 mL
  Administered 2023-06-27: 10 mL
  Administered 2023-06-27: 20 mL
  Administered 2023-06-28: 40 mL
  Administered 2023-06-29 – 2023-07-13 (×27): 10 mL

## 2023-06-19 MED ORDER — SODIUM CHLORIDE 0.9% FLUSH
10.0000 mL | INTRAVENOUS | Status: DC | PRN
  Administered 2023-07-08: 10 mL

## 2023-06-19 MED ORDER — DEXMEDETOMIDINE HCL IN NACL 400 MCG/100ML IV SOLN
0.0000 ug/kg/h | INTRAVENOUS | Status: DC
  Administered 2023-06-19: 0.6 ug/kg/h via INTRAVENOUS
  Administered 2023-06-19: 0.9 ug/kg/h via INTRAVENOUS
  Administered 2023-06-20: 1 ug/kg/h via INTRAVENOUS
  Administered 2023-06-20: 0.6 ug/kg/h via INTRAVENOUS
  Administered 2023-06-20: 0.8 ug/kg/h via INTRAVENOUS
  Administered 2023-06-21: 0.9 ug/kg/h via INTRAVENOUS
  Administered 2023-06-21: 1 ug/kg/h via INTRAVENOUS
  Administered 2023-06-21: 0.9 ug/kg/h via INTRAVENOUS
  Administered 2023-06-21 – 2023-06-23 (×6): 1 ug/kg/h via INTRAVENOUS
  Administered 2023-06-23: 0.9 ug/kg/h via INTRAVENOUS
  Administered 2023-06-23: 1 ug/kg/h via INTRAVENOUS
  Administered 2023-06-24: 0.8 ug/kg/h via INTRAVENOUS
  Administered 2023-06-24: 0.6 ug/kg/h via INTRAVENOUS
  Administered 2023-06-25: 1 ug/kg/h via INTRAVENOUS
  Administered 2023-06-25 (×2): 0.9 ug/kg/h via INTRAVENOUS
  Administered 2023-06-26: 1.2 ug/kg/h via INTRAVENOUS
  Administered 2023-06-26 (×2): 0.8 ug/kg/h via INTRAVENOUS
  Administered 2023-06-26: 1.1 ug/kg/h via INTRAVENOUS
  Administered 2023-06-27: 1 ug/kg/h via INTRAVENOUS
  Administered 2023-06-28 (×3): 0.8 ug/kg/h via INTRAVENOUS
  Administered 2023-06-29: 0.7 ug/kg/h via INTRAVENOUS
  Administered 2023-06-29 (×2): 0.8 ug/kg/h via INTRAVENOUS
  Administered 2023-06-30: 0.7 ug/kg/h via INTRAVENOUS
  Filled 2023-06-19 (×17): qty 100
  Filled 2023-06-19: qty 200
  Filled 2023-06-19 (×16): qty 100

## 2023-06-19 MED ORDER — MIDAZOLAM HCL 2 MG/2ML IJ SOLN
2.0000 mg | Freq: Once | INTRAMUSCULAR | Status: AC
  Administered 2023-06-19: 2 mg via INTRAVENOUS

## 2023-06-19 MED ORDER — FENTANYL CITRATE PF 50 MCG/ML IJ SOSY
50.0000 ug | PREFILLED_SYRINGE | Freq: Once | INTRAMUSCULAR | Status: AC
  Administered 2023-06-19: 50 ug via INTRAVENOUS

## 2023-06-19 MED ORDER — MIDAZOLAM HCL 2 MG/2ML IJ SOLN
1.0000 mg | INTRAMUSCULAR | Status: DC | PRN
  Administered 2023-06-19 – 2023-06-20 (×3): 2 mg via INTRAVENOUS
  Administered 2023-06-20: 4 mg via INTRAVENOUS
  Administered 2023-06-21 – 2023-06-25 (×4): 2 mg via INTRAVENOUS
  Filled 2023-06-19 (×10): qty 2

## 2023-06-19 NOTE — Progress Notes (Signed)
 Mother called tonight wanting to know results of diagnostic testing done today. Advised that MD must give results of testing and could contact her tomorrow during the day. Provided generalized update on patient condition.

## 2023-06-19 NOTE — CV Procedure (Signed)
 INDICATIONS: Septic shock  PROCEDURE:   Informed consent was obtained prior to the procedure from patient's mother. The risks, benefits and alternatives for the procedure were discussed and the patient comprehended these risks.  Risks include, but are not limited to, cough, sore throat, vomiting, nausea, somnolence, esophageal and stomach trauma or perforation, bleeding, low blood pressure, aspiration, pneumonia, infection, trauma to the teeth and death.    After a procedural time-out, TEE scope inserted beside ETT tube without difficulty.  During this procedure the patient was administered 100 mg fentanyl and 4 mg versed per patient's bedside RN Marcelino Duster.  The patient's heart rate, blood pressure, and oxygen saturation were monitored continuously during the procedure. The period of conscious sedation was 20 minutes, of which I was present face-to-face 100% of this time.  The transesophageal probe was inserted in the esophagus and stomach without difficulty and multiple views were obtained.  The patient was kept under observation during hte period of concious sedation.The patient remained in stable condition.   Agitated microbubble saline contrast was administered.  COMPLICATIONS:    There were no immediate complications.  FINDINGS:   FORMAL ECHOCARDIOGRAM REPORT PENDING EF 50%. No valvular abnormalities or vegetations. Possible tiny PFO with Grade 1 shunt left to right.   RECOMMENDATIONS:    No evidence of endocarditis contributing to septic shock.  Time Spent Directly with the Patient:  30 minutes   Brendan Preston 06/19/2023, 11:55 AM

## 2023-06-19 NOTE — Progress Notes (Signed)
 Called to patients room.  Patient had a desaturation episode.  Lavaged and suctioned patient, retrieved a large amount of secretions. Increased patients FI02 to 70% patients SP02 has increased to mid 90's.  RT will assess and wean Fi02 as tolerated.

## 2023-06-19 NOTE — Progress Notes (Signed)
  Echocardiogram Echocardiogram Transesophageal has been performed.  Janalyn Harder 06/19/2023, 12:07 PM

## 2023-06-19 NOTE — Progress Notes (Signed)
 Peripherally Inserted Central Catheter Placement  The IV Nurse has discussed with the patient and/or persons authorized to consent for the patient, the purpose of this procedure and the potential benefits and risks involved with this procedure.  The benefits include less needle sticks, lab draws from the catheter, and the patient may be discharged home with the catheter. Risks include, but not limited to, infection, bleeding, blood clot (thrombus formation), and puncture of an artery; nerve damage and irregular heartbeat and possibility to perform a PICC exchange if needed/ordered by physician.  Alternatives to this procedure were also discussed.  Bard Power PICC patient education guide, fact sheet on infection prevention and patient information card has been provided to patient /or left at bedside.    PICC Placement Documentation  PICC Triple Lumen 06/19/23 Right Basilic 41 cm 0 cm (Active)  Indication for Insertion or Continuance of Line Limited venous access - need for IV therapy >5 days (PICC only) 06/19/23 1230  Exposed Catheter (cm) 0 cm 06/19/23 1230  Site Assessment Clean, Dry, Intact 06/19/23 1230  Lumen #1 Status Flushed;Saline locked;Blood return noted 06/19/23 1230  Lumen #2 Status Flushed;Blood return noted;Saline locked 06/19/23 1230  Lumen #3 Status Flushed;Saline locked;Blood return noted 06/19/23 1230  Dressing Type Transparent;Securing device 06/19/23 1230  Dressing Status Antimicrobial disc/dressing in place;Clean, Dry, Intact 06/19/23 1230  Dressing Change Due 06/26/23 06/19/23 1230       Romie Jumper 06/19/2023, 12:33 PM

## 2023-06-19 NOTE — Progress Notes (Addendum)
 [facility]    Date of Admission:  06/13/2023   Total days of antibiotics 7/vanco   ID: Kenwood Rosiak is a 48 y.o. male with  MRSA bacteremia c/b pulm septic emboli concern for endocarditis with respiratory failure requiring vent support Principal Problem:   Severe sepsis (HCC) Active Problems:   MRSA bacteremia   Septic embolism (HCC)   Malnutrition of moderate degree    Subjective: Remains intubated, vent settings slightly improved. No pressor needs. Temp up to 101.18F this morning  Labs show improvement on leukocytosis, kidney function  Medications:   Chlorhexidine Gluconate Cloth  6 each Topical Daily   feeding supplement (PROSource TF20)  60 mL Per Tube Daily   free water  200 mL Per Tube Q8H   heparin  5,000 Units Subcutaneous Q8H   HYDROmorphone  1 mg Per Tube Q6H   insulin aspart  0-9 Units Subcutaneous Q4H   lidocaine  1 patch Transdermal Q24H   mouth rinse  15 mL Mouth Rinse Q2H   pantoprazole (PROTONIX) IV  40 mg Intravenous Q24H   polyethylene glycol  17 g Per Tube Daily   senna-docusate  2 tablet Per Tube BID   sodium chloride flush  10-40 mL Intracatheter Q12H   sodium chloride flush  3-10 mL Intravenous Q12H   thiamine (VITAMIN B1) injection  100 mg Intravenous Daily    Objective: Vital signs in last 24 hours: Temp:  [99.5 F (37.5 C)-101.7 F (38.7 C)] 101.7 F (38.7 C) (03/13 1423) Pulse Rate:  [73-114] 114 (03/13 1423) Resp:  [19-32] 32 (03/13 1423) BP: (104-151)/(64-92) 134/85 (03/13 1036) SpO2:  [91 %-99 %] 97 % (03/13 1423) FiO2 (%):  [40 %-45 %] 40 % (03/13 1240) Weight:  [66.6 kg] 66.6 kg (03/13 0400)  Physical Exam  Constitutional: sedated. He appears well-developed and well-nourished. No distress.  HENT: oett in place Cardiovascular: Normal rate, regular rhythm and normal heart sounds. Exam reveals no gallop and no friction rub.  No murmur heard.  Pulmonary/Chest: Effort normal and breath sounds normal. No  respiratory distress. He has no wheezes.  Abdominal: Soft. Bowel sounds are normal. He exhibits no distension. There is no tenderness.  Skin: Skin is warm and dry. No rash noted. No erythema. Scattered injection markings   Lab Results Recent Labs    06/17/23 0300 06/18/23 0338 06/18/23 0338 06/18/23 1918 06/19/23 0908  WBC  --  22.9*   < > 19.5* 16.1*  HGB  --  12.2*   < > 12.0* 9.7*  HCT  --  38.9*   < > 39.4 29.5*  NA 140 143  --   --   --   K 3.6 3.7  --   --   --   CL 106 108  --   --   --   CO2 21* 21*  --   --   --   BUN 45* 35*  --   --   --   CREATININE 1.43* 1.25*  --   --   --    < > = values in this interval not displayed.    C-Reactive Protein Recent Labs    06/17/23 0300  CRP 30.2*    Microbiology: Blood cx from 3/9 NGTD  Studies/Results: [place] EKG SITE RITE Result Date: 06/19/2023 If Site Rite image not attached, placement could not be confirmed due to current cardiac rhythm.  DG CHEST PORT 1 VIEW Result Date: 06/18/2023 CLINICAL DATA:  Respiratory EXAM:  PORTABLE CHEST 1 VIEW COMPARISON:  06/17/2023 FINDINGS: Cardiac shadow is stable. Endotracheal tube and gastric catheter are again seen in satisfactory position. Lungs are well aerated bilaterally with some persistent nodular opacities similar to that seen on prior exams. No new focal abnormality is noted. IMPRESSION: Stable patchy airspace opacities bilaterally. Electronically Signed   By: Alcide Clever M.D.   On: 06/18/2023 10:40   DG Abd 1 View Result Date: 06/17/2023 CLINICAL DATA:  Orogastric tube placement. EXAM: ABDOMEN - 1 VIEW COMPARISON:  Earlier today FINDINGS: The enteric tube has been advanced, the side port is now below the diaphragm in the stomach. Similar gaseous colonic distension. IMPRESSION: Enteric tube has been advanced, side port now below the diaphragm in the stomach. Electronically Signed   By: Narda Rutherford M.D.   On: 06/17/2023 20:24   DG CHEST PORT 1 VIEW Result Date:  06/17/2023 CLINICAL DATA:  Intubation and OG tube placement. EXAM: PORTABLE CHEST 1 VIEW, ABDOMEN ONE VIEW COMPARISON:  06/16/2023. FINDINGS: The heart size and mediastinal contours are stable. Patchy airspace disease is present in the lungs bilaterally. There are small bilateral pleural effusions. No pneumothorax is seen. The endotracheal tube terminates 7.2 cm above the carina. An enteric tube terminates in the region of the gastroesophageal junction and should be advanced 9 cm. Gas-filled loops of colon are noted in the upper abdomen. IMPRESSION: 1. Enteric tube terminates in the region of the gastroesophageal junction and should be advanced 9 cm. 2. Stable patchy airspace disease in the lungs. 3. Small bilateral pleural effusions. Electronically Signed   By: Thornell Sartorius M.D.   On: 06/17/2023 19:15   DG Abd 1 View Result Date: 06/17/2023 CLINICAL DATA:  Intubation and OG tube placement. EXAM: PORTABLE CHEST 1 VIEW, ABDOMEN ONE VIEW COMPARISON:  06/16/2023. FINDINGS: The heart size and mediastinal contours are stable. Patchy airspace disease is present in the lungs bilaterally. There are small bilateral pleural effusions. No pneumothorax is seen. The endotracheal tube terminates 7.2 cm above the carina. An enteric tube terminates in the region of the gastroesophageal junction and should be advanced 9 cm. Gas-filled loops of colon are noted in the upper abdomen. IMPRESSION: 1. Enteric tube terminates in the region of the gastroesophageal junction and should be advanced 9 cm. 2. Stable patchy airspace disease in the lungs. 3. Small bilateral pleural effusions. Electronically Signed   By: Thornell Sartorius M.D.   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 Awaiting to get TEE.  Therapeutic drug monitoring = checking vanco trough and peak to adjust dosing given cr function improving  Aki =  improving  Hep c ab + = will check Hep C VL  Respiratory distress requiring ventilator = defer to PCCM for vent management. Will follow up on trach culture given that still having fever. May need to expand to gram negative coverage.  Continue on contact isolation for MRSA infection.  evaluation of this patient requires complex antimicrobial therapy evaluation and counseling and isolation needs for disease transmission risk assessment and mitigation.   [facility] Pager: [phone]  06/19/2023, 2:50 PM

## 2023-06-19 NOTE — Progress Notes (Addendum)
   [facility] has been requested to perform a transesophageal echocardiogram on Gracy Racer for evaluation of possible endocarditis in the setting of MRSA bacteremia.     The patient does NOT have any absolute or relative contraindications to a Transesophageal Echocardiogram (TEE).  The patient has: Current Oxygen Requirement    Patient is currently intubated/sedated. After careful review of history and examination, the risks and benefits of transesophageal echocardiogram have been explained including risks of esophageal damage, perforation (1:10,000 risk), bleeding, pharyngeal hematoma as well as other potential complications associated with conscious sedation including aspiration, arrhythmia, respiratory failure and death. Alternatives to treatment were discussed, questions were answered. Patient's mother Herbert Seta, acting medical decision maker is willing to consent/proceed on patient's behalf.   Con Memos, PA-C  06/19/2023 10:42 AM   Consent performed as above. Labs permissible to proceed.  Parke Poisson, MD

## 2023-06-19 NOTE — Progress Notes (Signed)
 Pharmacy Antibiotic Note  Brendan Preston is a 48 y.o. male admitted on 06/13/2023 with sepsis, found at home lethargic and not responsive, history of polysubstance abuse.  Treating MRSA bacteremia with suspected septic emboli.  TEE pending.  Pharmacy has been consulted for Vancomycin dosing.  AKI noted on admission.  No baseline available.  SCr improving 3.3 > 2.13 > 1.43 > 1.25 Tm 101.2.  WBC at 16.1 Vancomycin levels drawn around morning dose today.  Trough = 18, Peak = 26 (drawn about 2 hours late due to TEE and PICC line placement) AUC calculated to be 527  Plan: Continue 750 mg IV q12h.  Goal AUC 400-550 Follow up renal function, culture results, and clinical course.   Height: (P) 5\' 11"  (180.3 cm) Weight: 66.6 kg (146 lb 13.2 oz) IBW/kg (Calculated) : (P) 75.3  Temp (24hrs), Avg:100.1 F (37.8 C), Min:99.5 F (37.5 C), Max:100.9 F (38.3 C)  Recent Labs  Lab 06/13/23 1349 06/13/23 1652 06/13/23 1704 06/13/23 2034 06/14/23 0249 06/14/23 1048 06/15/23 0820 06/16/23 0853 06/16/23 0936 06/17/23 0300 06/18/23 0338 06/18/23 1918 06/19/23 0817 06/19/23 0908 06/19/23 1312  WBC  --   --   --   --  11.4*  --   --   --  11.6*  --  22.9* 19.5*  --  16.1*  --   CREATININE  --    < >  --   --  3.19* 3.33* 2.92* 2.13*  --  1.43* 1.25*  --   --   --   --   LATICACIDVEN 8.4*  --  8.0* 5.1*  --  2.2*  --   --   --   --   --   --   --   --   --   VANCOTROUGH  --   --   --   --   --   --   --   --   --   --   --   --  18  --   --   VANCOPEAK  --   --   --   --   --   --   --   --   --   --   --   --   --   --  43*  VANCORANDOM  --   --   --   --  17  --   --   --   --   --   --   --   --   --   --    < > = values in this interval not displayed.    Estimated Creatinine Clearance: 68.1 mL/min (A) (by C-G formula based on SCr of 1.25 mg/dL (H)).    Allergies  Allergen Reactions   Penicillins Other (See Comments)    Noted on prior chart. Rash to cefepime 06/13/23, but tolerated  ceftriaxone in 2020. Mother is unsure if he is truly allergic to penicillin. (2025)   Cefepime Rash    Antimicrobials this admission: 3/7 cefepime x1 3/7 vancomycin >> 3/7 Rocephin >> 3/8   Dose adjustments this admission: 3/8 Vancomycin level (random ~12 hr level, drawn 02:49): 17, redose vanc.  3/10 empiric vanc renal adjustment 3/11 empiric vanc renal adjustment.  3/13 levels performed - continue 750mg  IV q12h   Microbiology results: 3/7 Resp panel: negative covid, flu, rsv 3/7 BCx: 4/4 bottles Staph aureus 3/7 BCID:  MRSA 3/7 UCx: NGF  3/7 LP: unable to read d/t blood  3/7 Meningitis/Encephalitis Panel (CSF): none detected 3/9 BCx: NG  Thank you for allowing pharmacy to be a part of this patient's care.  Celedonio Miyamoto, PharmD, BCIDP Clinical Pharmacist   06/19/2023 2:20 PM

## 2023-06-19 NOTE — Progress Notes (Signed)
 NAME:  Brendan Preston, MRN:  [id number], DOB:  15-Jul-1975, LOS: 6 ADMISSION DATE:  06/13/2023, CONSULTATION DATE:  06/13/23 REFERRING MD:  Dr. Doran Durand, CHIEF COMPLAINT:  AMS   History of Present Illness:  Pt encephalopathic, therefore HPI obtained from chart review.  See additional chart w/MRN [id number]  36 yoM with prior hx of known polysubstance (heroin/ fentanyl) abuse in which EMS was called out for AMS, last seen awake 3 days ago covered in urine and feces.   In ER, temp 104.3 rectal, tachycardic, normo to slightly hypertensive, sats 100% on NRB> but maintaining sats on RA with GCS ~10 but protecting airway.  Labs significant for lactic 8.4, WBC 15, plts 96, VBG 7.29/ 44,  CXR neg, RVP neg, EKG without ST changes, qtc borderline, CTH official read pending, trop hs 694, INR 1.3.  Foley placed- urine cloudy, glucose 150, Hgb 150 (-RBC), neg leuks/nitrates, WBC 6-10, UDS positive for THC and opiates.  CMET and other labs had to be redrawn and still awaiting due to hemolysis.  Cultures sent, empiric vanc/ cefepime given, 2L LR.  PCCM called for admit.  Pertinent  Medical History  Tobacco abuse, polysubstance abuse/ IVDA, ETOH unclear  Significant Hospital Events: Including procedures, antibiotic start and stop dates in addition to other pertinent events   3/7 admit, encephalopathic, altered, drug rash developed after cefepime 3/8 TTE negative for endocarditis 3/10 he had a TEE scheduled but sounds like he has declined this . Larey Seat out of bed late day shift and broke his nose. Bcx still pos. 3/11 Tubed in afternoon. CRP high.  3/12 changing his pain reg/ PAD 3/13 on vent  Interim History / Subjective:   Off pressors On Precedex Tmax 100.9 WBC count 19.5   Objective   Blood pressure (!) 151/92, pulse 73, temperature 99.5 F (37.5 C), resp. rate (!) 23, height (P) 5\' 11"  (1.803 m), weight 66.6 kg, SpO2 99%.    Vent Mode: PRVC FiO2 (%):  [40 %-45 %] 40 % Set Rate:  [16 bmp] 16  bmp Vt Set:  [600 mL] 600 mL PEEP:  [5 cmH20] 5 cmH20 Plateau Pressure:  [21 cmH20-28 cmH20] 21 cmH20   Intake/Output Summary (Last 24 hours) at 06/19/2023 0955 Last data filed at 06/19/2023 0912 Gross per 24 hour  Intake 1481.71 ml  Output 750 ml  Net 731.71 ml   Filed Weights   06/17/23 0555 06/18/23 0406 06/19/23 0400  Weight: 64.7 kg 62.5 kg 66.6 kg    Examination: Gen: Critically ill appearing, ENT: Endotracheal tube in place  CV: S1-S2 appreciated Resp: Rhonchi bilaterally Abd: Bowel sounds appreciated MSK: c/d/w  Neuro: Sedated  Resolved Hospital Problem list   Cefepime drug rash  Assessment & Plan:   Acute metabolic encephalopathy Polysubstance use Opiate dependence -On Dilaudid, as needed oxy, as needed fentanyl, Robaxin -Continue Precedex -RASS goal 0 to -1 -Delirium precautions  Acute hypoxemic respiratory failure -Continue mechanical ventilation -Target TVol 6-8cc/kgIBW -Target Plateau Pressure < 30cm H20 -Target driving pressure less than 15 cm of water -Target PaO2 55-65: titrate PEEP/FiO2 per protocol -Ventilator associated pneumonia prevention protocol  Septic shock MRSA bacteremia Septic emboli -Continue vancomycin -Appreciate ID follow-up -For possible TEE today  Traumatic fall with comminuted nasal bone fractures -Engaged ENT, will see him as outpatient when stable  AKI -Improving renal parameters -Avoid nephrotoxic medications  Acute biventricular dysfunction -Left ventricular ejection fraction of 40-45%, moderately reduced right ventricle function -Agreeable to TEE  Nutrition -Enteric nutrition    Best Practice (right  click and "Reselect all SmartList Selections" daily)   Diet/type: tubefeeds  DVT prophylaxis prophylactic heparin  Pressure ulcer(s): N/A GI prophylaxis: PPI Lines: N/A Foley:  Yes, and it is still needed Code Status:  full code Last date of multidisciplinary goals of care discussion [mother heather  updated by phone 3/8.  Mother updated at bedside 06/18/2023  The patient is critically ill with multiple organ systems failure and requires high complexity decision making for assessment and support, frequent evaluation and titration of therapies, application of advanced monitoring technologies and extensive interpretation of multiple databases. Critical Care Time devoted to patient care services described in this note independent of APP/resident time (if applicable)  is 35 minutes.   Virl Diamond MD [facility] Personal pager: See Amion If unanswered, please page CCM On-call: [phone]

## 2023-06-19 NOTE — Progress Notes (Signed)
  Overall stable  Ventilator requirement stable  Transesophageal echocardiogram did not reveal any valvular abnormalities nor vegetations, possible tiny PFO  Continue aggressive supportive measures  Continue antibiotics

## 2023-06-20 ENCOUNTER — Inpatient Hospital Stay (HOSPITAL_COMMUNITY): Payer: MEDICAID

## 2023-06-20 DIAGNOSIS — R0603 Acute respiratory distress: Secondary | ICD-10-CM

## 2023-06-20 DIAGNOSIS — J939 Pneumothorax, unspecified: Secondary | ICD-10-CM

## 2023-06-20 LAB — BASIC METABOLIC PANEL WITH GFR
Anion gap: 8 (ref 5–15)
BUN: 33 mg/dL — ABNORMAL HIGH (ref 6–20)
CO2: 25 mmol/L (ref 22–32)
Calcium: 7 mg/dL — ABNORMAL LOW (ref 8.9–10.3)
Chloride: 114 mmol/L — ABNORMAL HIGH (ref 98–111)
Creatinine, Ser: 0.98 mg/dL (ref 0.61–1.24)
GFR, Estimated: >60 mL/min (ref >60)
Glucose, Bld: 132 mg/dL — ABNORMAL HIGH (ref 70–99)
Potassium: 3 mmol/L — ABNORMAL LOW (ref 3.5–5.1)
Sodium: 147 mmol/L — ABNORMAL HIGH (ref 135–145)

## 2023-06-20 LAB — GLUCOSE, CAPILLARY
Glucose-Capillary: 114 mg/dL — ABNORMAL HIGH (ref 70–99)
Glucose-Capillary: 119 mg/dL — ABNORMAL HIGH (ref 70–99)
Glucose-Capillary: 127 mg/dL — ABNORMAL HIGH (ref 70–99)
Glucose-Capillary: 129 mg/dL — ABNORMAL HIGH (ref 70–99)
Glucose-Capillary: 138 mg/dL — ABNORMAL HIGH (ref 70–99)
Glucose-Capillary: 160 mg/dL — ABNORMAL HIGH (ref 70–99)

## 2023-06-20 LAB — CREATININE, SERUM
Creatinine, Ser: 0.92 mg/dL (ref 0.61–1.24)
GFR, Estimated: >60 mL/min (ref >60)

## 2023-06-20 LAB — HCV RNA QUANT: HCV Quantitative: NOT DETECTED [IU]/mL

## 2023-06-20 LAB — PHOSPHORUS: Phosphorus: 3.6 mg/dL (ref 2.5–4.6)

## 2023-06-20 LAB — MAGNESIUM: Magnesium: 2 mg/dL (ref 1.7–2.4)

## 2023-06-20 MED ORDER — FENTANYL CITRATE PF 50 MCG/ML IJ SOSY
25.0000 ug | PREFILLED_SYRINGE | INTRAMUSCULAR | Status: AC | PRN
  Administered 2023-06-20 – 2023-06-22 (×8): 50 ug via INTRAVENOUS
  Administered 2023-06-22: 25 ug via INTRAVENOUS
  Filled 2023-06-20: qty 1

## 2023-06-20 MED ORDER — IOHEXOL 9 MG/ML PO SOLN
ORAL | Status: AC
  Filled 2023-06-20: qty 1000

## 2023-06-20 MED ORDER — IOHEXOL 300 MG/ML  SOLN
100.0000 mL | Freq: Once | INTRAMUSCULAR | Status: AC | PRN
  Administered 2023-06-20: 100 mL via INTRAVENOUS

## 2023-06-20 MED ORDER — IOHEXOL 9 MG/ML PO SOLN
500.0000 mL | ORAL | Status: AC
  Administered 2023-06-20 (×2): 500 mL

## 2023-06-20 MED ORDER — SODIUM CHLORIDE 3 % IN NEBU
4.0000 mL | INHALATION_SOLUTION | Freq: Two times a day (BID) | RESPIRATORY_TRACT | Status: AC
  Administered 2023-06-20 – 2023-06-22 (×5): 4 mL via RESPIRATORY_TRACT
  Filled 2023-06-20 (×5): qty 4

## 2023-06-20 MED ORDER — METOPROLOL TARTRATE 5 MG/5ML IV SOLN
2.5000 mg | INTRAVENOUS | Status: DC | PRN
  Administered 2023-06-20 (×2): 5 mg via INTRAVENOUS
  Administered 2023-06-25: 2.5 mg via INTRAVENOUS
  Administered 2023-06-27 (×2): 5 mg via INTRAVENOUS
  Filled 2023-06-20 (×6): qty 5

## 2023-06-20 MED ORDER — IOHEXOL 9 MG/ML PO SOLN: 500.0000 mL | ORAL | Status: DC

## 2023-06-20 MED ORDER — HYDROMORPHONE HCL 2 MG PO TABS
1.0000 mg | ORAL_TABLET | Freq: Four times a day (QID) | ORAL | Status: DC
  Administered 2023-06-20 – 2023-06-21 (×7): 1 mg
  Filled 2023-06-20 (×6): qty 1

## 2023-06-20 MED ORDER — POTASSIUM CHLORIDE 10 MEQ/100ML IV SOLN
10.0000 meq | INTRAVENOUS | Status: AC
  Administered 2023-06-20 (×6): 10 meq via INTRAVENOUS
  Filled 2023-06-20 (×6): qty 100

## 2023-06-20 MED ORDER — OXYCODONE HCL 5 MG PO TABS
15.0000 mg | ORAL_TABLET | Freq: Four times a day (QID) | ORAL | Status: DC | PRN
  Administered 2023-06-20 – 2023-06-21 (×2): 15 mg
  Filled 2023-06-20 (×2): qty 3

## 2023-06-20 MED ORDER — HYDROMORPHONE HCL 2 MG PO TABS: 2.0000 mg | ORAL_TABLET | Freq: Four times a day (QID) | ORAL | Status: DC

## 2023-06-20 NOTE — Progress Notes (Signed)
 PT Cancellation Note  Patient Details Name: Brendan Preston MRN: [id number] DOB: 1975-12-04   Cancelled Treatment:     PT deferred, pt continues on vent.  Will follow.   [name] 06/20/2023, 6:47 AM

## 2023-06-20 NOTE — Progress Notes (Signed)
Pt transported to CT on vent and back with no complications

## 2023-06-20 NOTE — Progress Notes (Signed)
 [facility]   Reason for visit: Follow up on MRSA bacteremia  Interval History: remains intubated, WBC 16.1, febrile with Tmax 102.9.      Physical Exam: Constitutional:  Vitals:   06/20/23 1209 06/20/23 1300  BP:    Pulse: (!) 120 100  Resp: (!) 23 (!) 28  Temp: (!) 102.7 F (39.3 C) (!) 101.5 F (38.6 C)  SpO2: 95% 96%   HENT: +ET Respiratory: respiratory effort on vent  Review of Systems: Unable to be assessed due to mental status  Lab Results  Component Value Date   WBC 16.1 (H) 06/19/2023   HGB 9.7 (L) 06/19/2023   HCT 29.5 (L) 06/19/2023   MCV 83.1 06/19/2023   PLT 342 06/19/2023    Lab Results  Component Value Date   CREATININE 0.92 06/20/2023   CREATININE 0.98 06/20/2023   BUN 33 (H) 06/20/2023   NA 147 (H) 06/20/2023   K 3.0 (L) 06/20/2023   CL 114 (H) 06/20/2023   CO2 25 06/20/2023    Lab Results  Component Value Date   ALT 35 06/13/2023   AST 119 (H) 06/13/2023   ALKPHOS 64 06/13/2023     Microbiology: Recent Results (from the past 240 hours)  Culture, blood (Routine X 2) w Reflex to ID Panel     Status: Abnormal   Collection Time: 06/13/23  1:49 PM   Specimen: BLOOD  Result Value Ref Range Status   Specimen Description   Final    BLOOD SITE NOT SPECIFIED Performed at [facility], [address], [place], [place] [address]    Special Requests   Final    BOTTLES DRAWN AEROBIC AND ANAEROBIC Blood Culture adequate volume Performed at [facility], [address], [place], [place] [address]    Culture  Setup Time   Final    [facility] POSITIVE COCCI IN CLUSTERS IN BOTH AEROBIC AND ANAEROBIC BOTTLES PHARMD E JACKSON 06/14/2023 @ 0551 BY AB Performed at [facility], [address], [place], [place] [address]    Culture METHICILLIN RESISTANT STAPHYLOCOCCUS AUREUS (A)  Final   Report Status 06/17/2023 FINAL  Final   Organism ID, Bacteria METHICILLIN RESISTANT STAPHYLOCOCCUS  AUREUS  Final      Susceptibility   Methicillin resistant staphylococcus aureus - MIC*    CIPROFLOXACIN >=8 RESISTANT Resistant     ERYTHROMYCIN >=8 RESISTANT Resistant     GENTAMICIN <=0.5 SENSITIVE Sensitive     OXACILLIN >=4 RESISTANT Resistant     TETRACYCLINE <=1 SENSITIVE Sensitive     VANCOMYCIN 1 SENSITIVE Sensitive     TRIMETH/SULFA >=320 RESISTANT Resistant     CLINDAMYCIN <=0.25 SENSITIVE Sensitive     RIFAMPIN <=0.5 SENSITIVE Sensitive     Inducible Clindamycin NEGATIVE Sensitive     LINEZOLID 2 SENSITIVE Sensitive     * METHICILLIN RESISTANT STAPHYLOCOCCUS AUREUS  Blood Culture ID Panel (Reflexed)     Status: Abnormal   Collection Time: 06/13/23  1:49 PM  Result Value Ref Range Status   Enterococcus faecalis NOT DETECTED NOT DETECTED Final   Enterococcus Faecium NOT DETECTED NOT DETECTED Final   Listeria monocytogenes NOT DETECTED NOT DETECTED Final   Staphylococcus species DETECTED (A) NOT DETECTED Final    Comment: CRITICAL RESULT CALLED TO, READ BACK BY AND VERIFIED WITH: PHARMD E JACKSON 06/14/2023 @ 0551 BY AB    Staphylococcus aureus (BCID) DETECTED (A) NOT DETECTED Final    Comment: Methicillin (oxacillin)-resistant Staphylococcus aureus (  MRSA). MRSA is predictably resistant to beta-lactam antibiotics (except ceftaroline). Preferred therapy is vancomycin unless clinically contraindicated. Patient requires contact precautions if  hospitalized. CRITICAL RESULT CALLED TO, READ BACK BY AND VERIFIED WITH: PHARMD E JACKSON 06/14/2023 @ 0551 BY AB    Staphylococcus epidermidis NOT DETECTED NOT DETECTED Final   Staphylococcus lugdunensis NOT DETECTED NOT DETECTED Final   Streptococcus species NOT DETECTED NOT DETECTED Final   Streptococcus agalactiae NOT DETECTED NOT DETECTED Final   Streptococcus pneumoniae NOT DETECTED NOT DETECTED Final   Streptococcus pyogenes NOT DETECTED NOT DETECTED Final   A.calcoaceticus-baumannii NOT DETECTED NOT DETECTED Final   Bacteroides  fragilis NOT DETECTED NOT DETECTED Final   Enterobacterales NOT DETECTED NOT DETECTED Final   Enterobacter cloacae complex NOT DETECTED NOT DETECTED Final   Escherichia coli NOT DETECTED NOT DETECTED Final   Klebsiella aerogenes NOT DETECTED NOT DETECTED Final   Klebsiella oxytoca NOT DETECTED NOT DETECTED Final   Klebsiella pneumoniae NOT DETECTED NOT DETECTED Final   Proteus species NOT DETECTED NOT DETECTED Final   Salmonella species NOT DETECTED NOT DETECTED Final   Serratia marcescens NOT DETECTED NOT DETECTED Final   Haemophilus influenzae NOT DETECTED NOT DETECTED Final   Neisseria meningitidis NOT DETECTED NOT DETECTED Final   Pseudomonas aeruginosa NOT DETECTED NOT DETECTED Final   Stenotrophomonas maltophilia NOT DETECTED NOT DETECTED Final   Candida albicans NOT DETECTED NOT DETECTED Final   Candida auris NOT DETECTED NOT DETECTED Final   Candida glabrata NOT DETECTED NOT DETECTED Final   Candida krusei NOT DETECTED NOT DETECTED Final   Candida parapsilosis NOT DETECTED NOT DETECTED Final   Candida tropicalis NOT DETECTED NOT DETECTED Final   Cryptococcus neoformans/gattii NOT DETECTED NOT DETECTED Final   Meth resistant mecA/C and MREJ DETECTED (A) NOT DETECTED Final    Comment: CRITICAL RESULT CALLED TO, READ BACK BY AND VERIFIED WITH: PHARMD E JACKSON 06/14/2023 @ 0551 BY AB Performed at [facility], [address] [place], [place] [address]   Blood culture (routine x 2)     Status: Abnormal   Collection Time: 06/13/23  1:55 PM   Specimen: BLOOD RIGHT ARM  Result Value Ref Range Status   Specimen Description   Final    BLOOD RIGHT ARM Performed at [facility], [address] [place], [place] [address]    Special Requests   Final    BOTTLES DRAWN AEROBIC AND ANAEROBIC Blood Culture results may not be optimal due to an inadequate volume of blood received in culture bottles Performed at [facility], [address], [place], [place]  [address]    Culture  Setup Time   Final    GRAM POSITIVE COCCI IN CLUSTERS IN BOTH AEROBIC AND ANAEROBIC BOTTLES CRITICAL VALUE NOTED.  VALUE IS CONSISTENT WITH PREVIOUSLY REPORTED AND CALLED VALUE.    Culture (A)  Final    STAPHYLOCOCCUS AUREUS SUSCEPTIBILITIES PERFORMED ON PREVIOUS CULTURE WITHIN THE LAST 5 DAYS. Performed at [facility], [address], [place], [place] [address]    Report Status 06/17/2023 FINAL  Final  Resp panel by RT-PCR (RSV, Flu A&B, Covid) Anterior Nasal Swab     Status: None   Collection Time: 06/13/23  1:56 PM   Specimen: Anterior Nasal Swab  Result Value Ref Range Status   SARS Coronavirus 2 by RT PCR NEGATIVE NEGATIVE Final    Comment: (NOTE) SARS-CoV-2 target nucleic acids are NOT DETECTED.  The SARS-CoV-2 RNA is generally detectable in upper  respiratory specimens during the acute phase of infection. The lowest concentration of SARS-CoV-2 viral copies this assay can detect is 138 copies/mL. A negative result does not preclude SARS-Cov-2 infection and should not be used as the sole basis for treatment or other patient management decisions. A negative result may occur with  improper specimen collection/handling, submission of specimen other than nasopharyngeal swab, presence of viral mutation(s) within the areas targeted by this assay, and inadequate number of viral copies(<138 copies/mL). A negative result must be combined with clinical observations, patient history, and epidemiological information. The expected result is Negative.  Fact Sheet for Patients:  BloggerCourse.com  Fact Sheet for Healthcare Providers:  SeriousBroker.it  This test is no t yet approved or cleared by the [place] FDA and  has been authorized for detection and/or diagnosis of SARS-CoV-2 by FDA under an Emergency Use Authorization (EUA). This EUA will remain  in effect (meaning this test can be used) for the  duration of the COVID-19 declaration under Section 564(b)(1) of the Act, 21 U.S.C.section 360bbb-3(b)(1), unless the authorization is terminated  or revoked sooner.       Influenza A by PCR NEGATIVE NEGATIVE Final   Influenza B by PCR NEGATIVE NEGATIVE Final    Comment: (NOTE) The Xpert Xpress SARS-CoV-2/FLU/RSV plus assay is intended as an aid in the diagnosis of influenza from Nasopharyngeal swab specimens and should not be used as a sole basis for treatment. Nasal washings and aspirates are unacceptable for Xpert Xpress SARS-CoV-2/FLU/RSV testing.  Fact Sheet for Patients: BloggerCourse.com  Fact Sheet for Healthcare Providers: SeriousBroker.it  This test is not yet approved or cleared by the [place] FDA and has been authorized for detection and/or diagnosis of SARS-CoV-2 by FDA under an Emergency Use Authorization (EUA). This EUA will remain in effect (meaning this test can be used) for the duration of the COVID-19 declaration under Section 564(b)(1) of the Act, 21 U.S.C. section 360bbb-3(b)(1), unless the authorization is terminated or revoked.     Resp Syncytial Virus by PCR NEGATIVE NEGATIVE Final    Comment: (NOTE) Fact Sheet for Patients: BloggerCourse.com  Fact Sheet for Healthcare Providers: SeriousBroker.it  This test is not yet approved or cleared by the [place] FDA and has been authorized for detection and/or diagnosis of SARS-CoV-2 by FDA under an Emergency Use Authorization (EUA). This EUA will remain in effect (meaning this test can be used) for the duration of the COVID-19 declaration under Section 564(b)(1) of the Act, 21 U.S.C. section 360bbb-3(b)(1), unless the authorization is terminated or revoked.  Performed at [facility], [address] [place], [place] [address]   Urine Culture     Status: None   Collection  Time: 06/13/23  2:27 PM   Specimen: Urine, Clean Catch  Result Value Ref Range Status   Specimen Description   Final    URINE, CLEAN CATCH Performed at [facility], [address], [place], [place] [address]    Special Requests   Final    NONE Performed at [facility], [address] [place], [place] [address]    Culture   Final    NO GROWTH Performed at [facility], [address] [place], [place] [address]    Report Status 06/14/2023 FINAL  Final  Culture, blood (Routine X 2) w Reflex to ID Panel     Status: None (Preliminary result)   Collection Time: 06/15/23 10:27 PM   Specimen: BLOOD RIGHT HAND  Result Value  Ref Range Status   Specimen Description   Final    BLOOD RIGHT HAND Performed at [facility], [address] [place], [place] [address]    Special Requests   Final    BOTTLES DRAWN AEROBIC ONLY Blood Culture results may not be optimal due to an inadequate volume of blood received in culture bottles Performed at [facility], [address], [place], [place] [address]    Culture   Final    NO GROWTH 4 DAYS Performed at [facility], [address] [place], [place] [address]    Report Status PENDING  Incomplete  Culture, blood (Routine X 2) w Reflex to ID Panel     Status: None (Preliminary result)   Collection Time: 06/15/23 10:27 PM   Specimen: BLOOD RIGHT HAND  Result Value Ref Range Status   Specimen Description   Final    BLOOD RIGHT HAND Performed at [facility], [address] [place], [place] [address]    Special Requests   Final    BOTTLES DRAWN AEROBIC ONLY Blood Culture results may not be optimal due to an inadequate volume of blood received in culture bottles Performed at [facility], [address] [place], [place] [address]    Culture   Final    NO GROWTH 4 DAYS Performed at [facility], [address], [place], [place] [address]     Report Status PENDING  Incomplete  Culture, Respiratory w Gram Stain     Status: None (Preliminary result)   Collection Time: 06/18/23  8:19 AM   Specimen: Tracheal Aspirate; Respiratory  Result Value Ref Range Status   Specimen Description   Final    TRACHEAL ASPIRATE Performed at [facility], [address] [place], [place] [address]    Special Requests   Final    NONE Performed at [facility], [address] [place], [place] [address]    Gram Stain   Final    FEW WBC PRESENT, PREDOMINANTLY PMN RARE YEAST WITH PSEUDOHYPHAE    Culture   Final    CULTURE REINCUBATED FOR BETTER GROWTH Performed at [facility], [address], [place], [place] [address]    Report Status PENDING  Incomplete    Impression/Plan:  1. Disseminated MRSA infection with bacteremia - TEE done yesterday and no vegetations noted.  Repeat blood cultures from 3/9 ngtd.  Septic emboli though so suspect that he had vegetation at some point.   Continue with vancomycin.   2.  Respiratory distress - trach aspirate now growing Staph aureus c/w underlying infection.  On vancomycin as above.    3.  Fever - ongoing and high fever.  No vegetation noted on TEE so unclear cause of persistence.  Concern for deep source such as abscess.   I will check a CT abdomen for ? Psoas abscess or other deep area.     Evaluation of this patient requires complex antimicrobial therapy evaluation and counseling + isolation needs for disease transmission risk assessment and mitigation.

## 2023-06-20 NOTE — Plan of Care (Signed)

## 2023-06-20 NOTE — Progress Notes (Signed)
 NAME:  Brendan Preston, MRN:  [id number], DOB:  1976-03-07, LOS: 7 ADMISSION DATE:  06/13/2023, CONSULTATION DATE:  06/13/23 REFERRING MD:  Dr. Doran Durand, CHIEF COMPLAINT:  AMS   History of Present Illness:  Pt encephalopathic, therefore HPI obtained from chart review.  See additional chart w/MRN [id number]  49 yoM with prior hx of known polysubstance (heroin/ fentanyl) abuse in which EMS was called out for AMS, last seen awake 3 days ago covered in urine and feces.   In ER, temp 104.3 rectal, tachycardic, normo to slightly hypertensive, sats 100% on NRB> but maintaining sats on RA with GCS ~10 but protecting airway.  Labs significant for lactic 8.4, WBC 15, plts 96, VBG 7.29/ 44,  CXR neg, RVP neg, EKG without ST changes, qtc borderline, CTH official read pending, trop hs 694, INR 1.3.  Foley placed- urine cloudy, glucose 150, Hgb 150 (-RBC), neg leuks/nitrates, WBC 6-10, UDS positive for THC and opiates.  CMET and other labs had to be redrawn and still awaiting due to hemolysis.  Cultures sent, empiric vanc/ cefepime given, 2L LR.  PCCM called for admit.  Pertinent  Medical History  Tobacco abuse, polysubstance abuse/ IVDA, ETOH unclear  Significant Hospital Events: Including procedures, antibiotic start and stop dates in addition to other pertinent events   3/7 admit, encephalopathic, altered, drug rash developed after cefepime 3/8 TTE negative for endocarditis 3/10 he had a TEE scheduled but sounds like he has declined this . Larey Seat out of bed late day shift and broke his nose. Bcx still pos. 3/11 Tubed in afternoon. CRP high.  3/12 changing his pain reg/ PAD 3/13 on vent  Interim History / Subjective:   Off pressors T 100.4   Bmp pending    Objective   Blood pressure (!) 149/77, pulse 96, temperature (!) 100.6 F (38.1 C), resp. rate (!) 24, height (P) 5\' 11"  (1.803 m), weight 63.9 kg, SpO2 96%.    Vent Mode: PRVC FiO2 (%):  [40 %-70 %] 50 % Set Rate:  [16 bmp] 16 bmp Vt Set:   [600 mL] 600 mL PEEP:  [5 cmH20] 5 cmH20 Plateau Pressure:  [20 cmH20-37 cmH20] 37 cmH20   Intake/Output Summary (Last 24 hours) at 06/20/2023 0936 Last data filed at 06/20/2023 0800 Gross per 24 hour  Intake 953.23 ml  Output 1200 ml  Net -246.77 ml   Filed Weights   06/18/23 0406 06/19/23 0400 06/20/23 0500  Weight: 62.5 kg 66.6 kg 63.9 kg    Examination: Gen: Critically and chronically ill middle aged M NAD  ENT: Nose is bruised. pink mm ETT GT secure  CV: rrr s1s2 cap refill < 3 sec  Resp: Mechanically ventilated incr RR + rhonchi  Abd: thin soft borderline hypoactive  MSK: no acute joint deformity  Neuro: lightly sedated, awakens follows commands and is alert   Resolved Hospital Problem list   Cefepime drug rash Encephalopathy  AKI  Shock  Assessment & Plan:   Polysubstance abuse Opiate dependence Back pain   -SCH dilaudid + PRN fent,  Incr per tube PRN oxy to 15mg  q6 PRN 3/14, and PRN robaxin   -Continue Precedex -RASS goal 0 to -1 -Delirium precautions -cessation support, TOC consult   Septic shock, improving shock  MRSA bacteremia Septic emboli -TEE without evidence of BE -Id following -vanc   Acute hypoxic resp failure Septic emboli Bilat PNA, likely MRSA  P -wean MV as able, think he has several days yet before he will be able to start  weaning -VAP, pulm hygiene   Acute BiV dysfunction Possible PFO  -TEE w possible small PFO grade 1 L to R shunt  P -supportive care -consider repeat TTE closer to dc; if this is septic cardiomyopathy might have some recovery    Fall, comminuted nasal bone fx -Engaged ENT, will see him as outpatient when stable  Nutrition -Enteric nutrition    Best Practice (right click and "Reselect all SmartList Selections" daily)   Diet/type: tubefeeds  DVT prophylaxis prophylactic heparin  Pressure ulcer(s): N/A GI prophylaxis: PPI Lines: N/A Foley:  Yes, and it is still needed Code Status:  full code Last date  of multidisciplinary goals of care discussion [mother heather updated by phone 3/8.  Mother updated at bedside 06/18/2023  CRITICAL CARE Performed by: Lanier Clam   Total critical care time: 37 minutes  Critical care time was exclusive of separately billable procedures and treating other patients.  Critical care was necessary to treat or prevent imminent or life-threatening deterioration.  Critical care was time spent personally by me on the following activities: development of treatment plan with patient and/or surrogate as well as nursing, discussions with consultants, evaluation of patient's response to treatment, examination of patient, obtaining history from patient or surrogate, ordering and performing treatments and interventions, ordering and review of laboratory studies, ordering and review of radiographic studies, pulse oximetry and re-evaluation of patient's condition.  Tessie Fass MSN, AGACNP-BC [facility] Pulmonary/Critical Care Medicine Amion for pager 06/20/2023, 9:36 AM

## 2023-06-20 NOTE — TOC Progression Note (Signed)
 Transition of Care [facility]) - Progression Note    Patient Details  Name: Brendan Preston MRN: [id number] Date of Birth: 04-16-1975  [facility] CM/SW Contact  Otelia Santee, LCSW Phone Number: 06/20/2023, 3:09 PM  Clinical Narrative:    TOC will meet with pt to provide substance use education and resources once able to participate in conversation. Currently on vent.      Barriers to Discharge: Continued Medical Work up  Expected Discharge Plan and Services     Post Acute Care Choice: NA Living arrangements for the past 2 months: Single Family Home                   DME Agency: NA                   Social Determinants of Health (SDOH) Interventions SDOH Screenings   Food Insecurity: Patient Declined (06/15/2023)  Housing: Unknown (06/16/2023)  Transportation Needs: Patient Unable To Answer (06/16/2023)  Utilities: Patient Unable To Answer (06/16/2023)  Tobacco Use: High Risk (06/13/2023)    Readmission Risk Interventions    06/14/2023    8:46 AM  Readmission Risk Prevention Plan  Post Dischage Appt Complete  Medication Screening Complete  Transportation Screening Complete

## 2023-06-20 NOTE — Plan of Care (Signed)
  Problem: Education: Goal: Knowledge of General Education information will improve Description: Including pain rating scale, medication(s)/side effects and non-pharmacologic comfort measures Outcome: Progressing   Problem: Clinical Measurements: Goal: Diagnostic test results will improve Outcome: Progressing Goal: Cardiovascular complication will be avoided Outcome: Progressing   Problem: Nutrition: Goal: Adequate nutrition will be maintained Outcome: Progressing   Problem: Coping: Goal: Level of anxiety will decrease Outcome: Progressing   

## 2023-06-20 NOTE — Procedures (Signed)
 Insertion of Chest Tube Procedure Note  Brendan Preston  [id number]  1975-12-09  Date:06/20/23  Time:9:34 PM    Provider Performing: Rozann Lesches   Procedure: Chest Tube Insertion (32551)  Indication(s) Pneumothorax  Consent Risks of the procedure as well as the alternatives and risks of each were explained to the patient and/or caregiver.  Consent for the procedure was obtained and is signed in the bedside chart Verbal Consent from Mother Brendan Preston. Anesthesia Topical only with 1% lidocaine    Time Out Verified patient identification, verified procedure, site/side was marked, verified correct patient position, special equipment/implants available, medications/allergies/relevant history reviewed, required imaging and test results available.   Sterile Technique Maximal sterile technique including full sterile barrier drape, hand hygiene, sterile gown, sterile gloves, mask, hair covering, sterile ultrasound probe cover (if used).   Procedure Description Ultrasound not used to identify appropriate pleural anatomy for placement and overlying skin marked. Area of placement cleaned and draped in sterile fashion.  A 14 French pigtail pleural catheter was placed into the left pleural space using Seldinger technique. Appropriate return of fluid was obtained.  The tube was connected to atrium and placed on -20 cm H2O wall suction.   Complications/Tolerance None; patient tolerated the procedure well. Chest X-ray is ordered to verify placement.   EBL Minimal  Specimen(s) none

## 2023-06-21 ENCOUNTER — Inpatient Hospital Stay (HOSPITAL_COMMUNITY): Payer: MEDICAID

## 2023-06-21 DIAGNOSIS — M7989 Other specified soft tissue disorders: Secondary | ICD-10-CM

## 2023-06-21 DIAGNOSIS — J9312 Secondary spontaneous pneumothorax: Secondary | ICD-10-CM

## 2023-06-21 DIAGNOSIS — J942 Hemothorax: Secondary | ICD-10-CM

## 2023-06-21 LAB — GLUCOSE, CAPILLARY
Glucose-Capillary: 123 mg/dL — ABNORMAL HIGH (ref 70–99)
Glucose-Capillary: 152 mg/dL — ABNORMAL HIGH (ref 70–99)
Glucose-Capillary: 128 mg/dL — ABNORMAL HIGH (ref 70–99)
Glucose-Capillary: 124 mg/dL — ABNORMAL HIGH (ref 70–99)

## 2023-06-21 LAB — BASIC METABOLIC PANEL WITH GFR
Anion gap: 5 (ref 5–15)
BUN: 25 mg/dL — ABNORMAL HIGH (ref 6–20)
CO2: 27 mmol/L (ref 22–32)
Calcium: 7.1 mg/dL — ABNORMAL LOW (ref 8.9–10.3)
Chloride: 112 mmol/L — ABNORMAL HIGH (ref 98–111)
Creatinine, Ser: 1.1 mg/dL (ref 0.61–1.24)
GFR, Estimated: >60 mL/min (ref >60)
Glucose, Bld: 138 mg/dL — ABNORMAL HIGH (ref 70–99)
Potassium: 3.4 mmol/L — ABNORMAL LOW (ref 3.5–5.1)
Sodium: 144 mmol/L (ref 135–145)

## 2023-06-21 LAB — CBC WITH DIFFERENTIAL/PLATELET
Abs Immature Granulocytes: 0.27 10*3/uL — ABNORMAL HIGH (ref 0.00–0.07)
Basophils Absolute: 0.1 10*3/uL (ref 0.0–0.1)
Basophils Relative: 0 %
Eosinophils Absolute: 0.1 10*3/uL (ref 0.0–0.5)
Eosinophils Relative: 0 %
HCT: 26.2 % — ABNORMAL LOW (ref 39.0–52.0)
Hemoglobin: 8.2 g/dL — ABNORMAL LOW (ref 13.0–17.0)
Immature Granulocytes: 2 %
Lymphocytes Relative: 9 %
Lymphs Abs: 1.5 10*3/uL (ref 0.7–4.0)
MCH: 27.2 pg (ref 26.0–34.0)
MCHC: 31.3 g/dL (ref 30.0–36.0)
MCV: 86.8 fL (ref 80.0–100.0)
Monocytes Absolute: 0.5 10*3/uL (ref 0.1–1.0)
Monocytes Relative: 3 %
Neutro Abs: 15 10*3/uL — ABNORMAL HIGH (ref 1.7–7.7)
Neutrophils Relative %: 86 %
Platelets: 362 10*3/uL (ref 150–400)
RBC: 3.02 MIL/uL — ABNORMAL LOW (ref 4.22–5.81)
RDW: 18.5 % — ABNORMAL HIGH (ref 11.5–15.5)
WBC: 17.3 10*3/uL — ABNORMAL HIGH (ref 4.0–10.5)
nRBC: 0 % (ref 0.0–0.2)

## 2023-06-21 LAB — CULTURE, BLOOD (ROUTINE X 2)
Culture: NO GROWTH
Culture: NO GROWTH

## 2023-06-21 LAB — PHOSPHORUS: Phosphorus: 3 mg/dL (ref 2.5–4.6)

## 2023-06-21 LAB — MAGNESIUM: Magnesium: 1.8 mg/dL (ref 1.7–2.4)

## 2023-06-21 LAB — ECHO TEE

## 2023-06-21 MED ORDER — HEPARIN BOLUS VIA INFUSION
4000.0000 [IU] | Freq: Once | INTRAVENOUS | Status: AC
  Administered 2023-06-21: 4000 [IU] via INTRAVENOUS
  Filled 2023-06-21: qty 4000

## 2023-06-21 MED ORDER — SODIUM CHLORIDE 0.9% FLUSH
10.0000 mL | Freq: Three times a day (TID) | INTRAVENOUS | Status: DC
  Administered 2023-06-21 – 2023-06-23 (×6): 10 mL via INTRAPLEURAL

## 2023-06-21 MED ORDER — GADOBUTROL 1 MMOL/ML IV SOLN
6.0000 mL | Freq: Once | INTRAVENOUS | Status: AC | PRN
  Administered 2023-06-21: 6 mL via INTRAVENOUS

## 2023-06-21 MED ORDER — CLONAZEPAM 0.5 MG PO TBDP
1.0000 mg | ORAL_TABLET | Freq: Two times a day (BID) | ORAL | Status: DC
  Administered 2023-06-21 – 2023-06-28 (×15): 1 mg
  Filled 2023-06-21 (×15): qty 2

## 2023-06-21 MED ORDER — HEPARIN (PORCINE) 25000 UT/250ML-% IV SOLN
2000.0000 [IU]/h | INTRAVENOUS | Status: AC
  Administered 2023-06-21: 1150 [IU]/h via INTRAVENOUS
  Administered 2023-06-22: 1800 [IU]/h via INTRAVENOUS
  Administered 2023-06-22: 1350 [IU]/h via INTRAVENOUS
  Filled 2023-06-21 (×2): qty 250

## 2023-06-21 MED ORDER — POTASSIUM CHLORIDE 10 MEQ/100ML IV SOLN
10.0000 meq | INTRAVENOUS | Status: AC
  Administered 2023-06-21 (×4): 10 meq via INTRAVENOUS
  Filled 2023-06-21 (×4): qty 100

## 2023-06-21 MED ORDER — MIDAZOLAM HCL 2 MG/2ML IJ SOLN
2.0000 mg | Freq: Once | INTRAMUSCULAR | Status: AC
  Administered 2023-06-21: 2 mg via INTRAVENOUS

## 2023-06-21 MED ORDER — OXYCODONE HCL 5 MG PO TABS
10.0000 mg | ORAL_TABLET | Freq: Four times a day (QID) | ORAL | Status: DC
  Administered 2023-06-21 (×2): 10 mg
  Filled 2023-06-21 (×2): qty 2

## 2023-06-21 NOTE — Progress Notes (Signed)
 NAME:  Brendan Preston, MRN:  [id number], DOB:  [birth year], LOS: 8 ADMISSION DATE:  06/13/2023, CONSULTATION DATE:  06/13/23 REFERRING MD:  Dr. Doran Durand, CHIEF COMPLAINT:  AMS   History of Present Illness:  Pt encephalopathic, therefore HPI obtained from chart review.  See additional chart w/MRN [id number]  66 yoM with prior hx of known polysubstance (heroin/ fentanyl) abuse in which EMS was called out for AMS, last seen awake 3 days ago covered in urine and feces.   In ER, temp 104.3 rectal, tachycardic, normo to slightly hypertensive, sats 100% on NRB> but maintaining sats on RA with GCS ~10 but protecting airway.  Labs significant for lactic 8.4, WBC 15, plts 96, VBG 7.29/ 44,  CXR neg, RVP neg, EKG without ST changes, qtc borderline, CTH official read pending, trop hs 694, INR 1.3.  Foley placed- urine cloudy, glucose 150, Hgb 150 (-RBC), neg leuks/nitrates, WBC 6-10, UDS positive for THC and opiates.  CMET and other labs had to be redrawn and still awaiting due to hemolysis.  Cultures sent, empiric vanc/ cefepime given, 2L LR.  PCCM called for admit.  Pertinent  Medical History  Tobacco abuse, polysubstance abuse/ IVDA, ETOH unclear  Significant Hospital Events: Including procedures, antibiotic start and stop dates in addition to other pertinent events   3/7 admit, encephalopathic, altered, drug rash developed after cefepime 3/8 TTE negative for endocarditis 3/10 he had a TEE scheduled but sounds like he has declined this . Larey Seat out of bed late day shift and broke his nose. Bcx still pos. 3/11 Tubed in afternoon. CRP high.  3/12 changing his pain reg/ PAD 3/13 on vent 3/14 CT abdomen showed left pneumothorax, bilateral lower lobe consolidation, multiple cavitary lesions, suspicious for bilateral common iliac DVT, fluid collection along L4/L5 vertebral bodies  Interim History / Subjective:   Left pigtail inserted for left pneumothorax overnight Remains critically ill, intubated Febrile  102 Sedate and calm on Precedex/fentanyl Urine output 800 cc charted  Objective   Blood pressure 130/67, pulse 85, temperature 100 F (37.8 C), resp. rate 19, height (P) 5\' 11"  (1.803 m), weight 67 kg, SpO2 (!) 88%.    Vent Mode: PRVC FiO2 (%):  [50 %-100 %] 60 % Set Rate:  [16 bmp] 16 bmp Vt Set:  [550 mL] 550 mL PEEP:  [5 cmH20] 5 cmH20 Plateau Pressure:  [17 cmH20-26 cmH20] 25 cmH20   Intake/Output Summary (Last 24 hours) at 06/21/2023 1013 Last data filed at 06/21/2023 8469 Gross per 24 hour  Intake 2305.86 ml  Output 1400 ml  Net 905.86 ml   Filed Weights   06/19/23 0400 06/20/23 0500 06/21/23 0711  Weight: 66.6 kg 63.9 kg 67 kg    Examination: Gen: Critically and chronically ill middle aged M NAD  ENT: Nose is bruised. pink mm ETT GT secure  CV: rrr s1s2 cap refill < 3 sec  Resp: Bilateral ventilated breath sounds, coarse scattered rhonchi, no accessory muscle use, no airleak on left pigtail Abd: thin soft borderline hypoactive  MSK: no acute joint deformity  Neuro: lightly sedated, awakens follows commands and is alert , request pain medications  Labs show hypokalemia 3.4, mild leukocytosis, stable anemia  Resolved Hospital Problem list   Cefepime drug rash Encephalopathy  AKI  Shock  Assessment & Plan:   Polysubstance abuse Opiate dependence Back pain   [facility] dilaudid + PRN fent,  Incr per tube PRN oxy to 15mg  q6 PRN 3/14, and PRN robaxin   -Continue Precedex and fentanyl drip -RASS  goal 0 to -1 -Delirium precautions -cessation support, TOC consult   Septic shock, improved MRSA bacteremia , TEE negative for endocarditis Septic emboli -TEE without evidence of BE -Id following -vanc  -Concern for lumbar epidural abscess, proceed with MRI thoracolumbar spine  Acute hypoxic resp failure Septic emboli Bilat PNA, likely MRSA  Left hemothorax P -Not ready for weaning yet -VAP, pulm hygiene  -Continue pigtail to suction  Acute BiV dysfunction -EF 40  to 45% Possible PFO  -TEE w possible small PFO grade 1 L to R shunt  P -supportive care -consider repeat TTE    Fall, comminuted nasal bone fx -Engaged ENT, will see him as outpatient when stable  Nutrition -Enteric nutrition    Best Practice (right click and "Reselect all SmartList Selections" daily)   Diet/type: tubefeeds  DVT prophylaxis prophylactic heparin  Pressure ulcer(s): N/A GI prophylaxis: PPI Lines: N/A Foley:  Yes, and it is still needed Code Status:  full code Last date of multidisciplinary goals of care discussion [mother heather updated by phone 3/8.  Mother updated at bedside 3/14  CRITICAL CARE Performed by: Comer Locket. Claris Guymon   Total critical care time: 34 minutes  Critical care time was exclusive of separately billable procedures and treating other patients.  Critical care was necessary to treat or prevent imminent or life-threatening deterioration.  Critical care was time spent personally by me on the following activities: development of treatment plan with patient and/or surrogate as well as nursing, discussions with consultants, evaluation of patient's response to treatment, examination of patient, obtaining history from patient or surrogate, ordering and performing treatments and interventions, ordering and review of laboratory studies, ordering and review of radiographic studies, pulse oximetry and re-evaluation of patient's condition.  Comer Locket Vassie Loll MD [facility] Pulmonary/Critical Care Medicine Amion for pager 06/21/2023, 10:13 AM

## 2023-06-21 NOTE — Progress Notes (Signed)
 Bilateral lower extremity venous duplex has been completed. Preliminary results can be found in CV Proc through chart review.   06/21/23 3:42 PM Olen Cordial RVT

## 2023-06-21 NOTE — Progress Notes (Signed)
 PHARMACY - ANTICOAGULATION CONSULT NOTE  Pharmacy Consult for IV heparin Indication: DVT  Allergies  Allergen Reactions   Penicillins Other (See Comments)    Noted on prior chart. Rash to cefepime 06/13/23, but tolerated ceftriaxone in 2020. Mother is unsure if he is truly allergic to penicillin. (2025)   Cefepime Rash    Patient Measurements: Height: (P) 5\' 11"  (180.3 cm) Weight: 67 kg (147 lb 11.3 oz) IBW/kg (Calculated) : (P) 75.3 Heparin Dosing Weight: 67 kg   Vital Signs: Temp: 102 F (38.9 C) (03/15 1100) BP: 129/63 (03/15 1100) Pulse Rate: 90 (03/15 1100)  Labs: Recent Labs    06/18/23 1918 06/19/23 0908 06/20/23 0538 06/21/23 0548  HGB 12.0* 9.7*  --  8.2*  HCT 39.4 29.5*  --  26.2*  PLT 263 342  --  362  CREATININE  --   --  0.98  0.92 1.10    Estimated Creatinine Clearance: 77.8 mL/min (by C-G formula based on SCr of 1.1 mg/dL).   Medical History: Past Medical History:  Diagnosis Date   Hepatitis C     Medications:  No medications prior to admission.    Assessment: Pharmacy is consulted to dose heparin in 48 yo male newly diagnosed with DVT. Pt has been admitted. CT of abdomen is suspicious for DVT.   Today, 06/21/23  Hgb 8.2 low trending down Plt WNL Scr 1.10 mg/dl    Goal of Therapy:  Heparin level 0.3-0.7 units/ml Monitor platelets by anticoagulation protocol: Yes   Plan:  Heparin 4000 unit bolus followed by heparin 1150 units/hr  Obtain HL 6 hours after start of infusion Daily CBC< HL Monitor for signs and symptoms of bleeding   Adalberto Cole, PharmD, BCPS 06/21/2023 12:41 PM

## 2023-06-22 ENCOUNTER — Inpatient Hospital Stay (HOSPITAL_COMMUNITY): Payer: MEDICAID

## 2023-06-22 LAB — CULTURE, RESPIRATORY W GRAM STAIN

## 2023-06-22 LAB — BASIC METABOLIC PANEL WITH GFR
Anion gap: 6 (ref 5–15)
BUN: 22 mg/dL — ABNORMAL HIGH (ref 6–20)
CO2: 25 mmol/L (ref 22–32)
Calcium: 6.9 mg/dL — ABNORMAL LOW (ref 8.9–10.3)
Chloride: 112 mmol/L — ABNORMAL HIGH (ref 98–111)
Creatinine, Ser: 1.1 mg/dL (ref 0.61–1.24)
GFR, Estimated: >60 mL/min (ref >60)
Glucose, Bld: 157 mg/dL — ABNORMAL HIGH (ref 70–99)
Potassium: 3.5 mmol/L (ref 3.5–5.1)
Sodium: 143 mmol/L (ref 135–145)

## 2023-06-22 LAB — GLUCOSE, CAPILLARY
Glucose-Capillary: 136 mg/dL — ABNORMAL HIGH (ref 70–99)
Glucose-Capillary: 168 mg/dL — ABNORMAL HIGH (ref 70–99)
Glucose-Capillary: 114 mg/dL — ABNORMAL HIGH (ref 70–99)
Glucose-Capillary: 145 mg/dL — ABNORMAL HIGH (ref 70–99)

## 2023-06-22 LAB — CBC WITH DIFFERENTIAL/PLATELET
Abs Immature Granulocytes: 0.25 10*3/uL — ABNORMAL HIGH (ref 0.00–0.07)
Basophils Absolute: 0 10*3/uL (ref 0.0–0.1)
Basophils Relative: 0 %
Eosinophils Absolute: 0 10*3/uL (ref 0.0–0.5)
Eosinophils Relative: 0 %
HCT: 24.2 % — ABNORMAL LOW (ref 39.0–52.0)
Hemoglobin: 7.4 g/dL — ABNORMAL LOW (ref 13.0–17.0)
Immature Granulocytes: 2 %
Lymphocytes Relative: 8 %
Lymphs Abs: 1.3 10*3/uL (ref 0.7–4.0)
MCH: 27.3 pg (ref 26.0–34.0)
MCHC: 30.6 g/dL (ref 30.0–36.0)
MCV: 89.3 fL (ref 80.0–100.0)
Monocytes Absolute: 0.4 10*3/uL (ref 0.1–1.0)
Monocytes Relative: 3 %
Neutro Abs: 14.2 10*3/uL — ABNORMAL HIGH (ref 1.7–7.7)
Neutrophils Relative %: 87 %
Platelets: 313 10*3/uL (ref 150–400)
RBC: 2.71 MIL/uL — ABNORMAL LOW (ref 4.22–5.81)
RDW: 18.2 % — ABNORMAL HIGH (ref 11.5–15.5)
WBC: 16.3 10*3/uL — ABNORMAL HIGH (ref 4.0–10.5)
nRBC: 0 % (ref 0.0–0.2)

## 2023-06-22 LAB — HEPARIN LEVEL (UNFRACTIONATED)
Heparin Unfractionated: <0.1 [IU]/mL — ABNORMAL LOW (ref 0.30–0.70)
Heparin Unfractionated: <0.1 [IU]/mL — ABNORMAL LOW (ref 0.30–0.70)
Heparin Unfractionated: <0.1 [IU]/mL — ABNORMAL LOW (ref 0.30–0.70)

## 2023-06-22 LAB — MAGNESIUM: Magnesium: 1.7 mg/dL (ref 1.7–2.4)

## 2023-06-22 LAB — PHOSPHORUS: Phosphorus: 3.2 mg/dL (ref 2.5–4.6)

## 2023-06-22 MED ORDER — HEPARIN BOLUS VIA INFUSION
2000.0000 [IU] | Freq: Once | INTRAVENOUS | Status: AC
  Administered 2023-06-22: 2000 [IU] via INTRAVENOUS
  Filled 2023-06-22: qty 2000

## 2023-06-22 MED ORDER — OXYCODONE HCL 5 MG PO TABS
10.0000 mg | ORAL_TABLET | ORAL | Status: DC
  Administered 2023-06-22 – 2023-06-26 (×21): 10 mg
  Filled 2023-06-22 (×22): qty 2

## 2023-06-22 MED ORDER — POTASSIUM CHLORIDE 20 MEQ PO PACK
40.0000 meq | PACK | Freq: Once | ORAL | Status: AC
  Administered 2023-06-22: 40 meq
  Filled 2023-06-22: qty 2

## 2023-06-22 MED ORDER — MAGNESIUM SULFATE 2 GM/50ML IV SOLN
2.0000 g | Freq: Once | INTRAVENOUS | Status: AC
  Administered 2023-06-22: 2 g via INTRAVENOUS
  Filled 2023-06-22: qty 50

## 2023-06-22 NOTE — Progress Notes (Signed)
 [facility] ADULT ICU REPLACEMENT PROTOCOL   The patient does apply for the [facility] Adult ICU Electrolyte Replacment Protocol based on the criteria listed below:   1.Exclusion criteria: TCTS, ECMO, Dialysis, and Myasthenia Gravis patients 2. Is GFR >/= 30 ml/min? Yes.    Patient's GFR today is >60 3. Is SCr </= 2? Yes.   Patient's SCr is 1.10 mg/dL 4. Did SCr increase >/= 0.5 in 24 hours? No. 5.Pt's weight >40kg  Yes.   6. Abnormal electrolyte(s): K, Mag  7. Electrolytes replaced per protocol 8.  Call MD STAT for K+ </= 2.5, Phos </= 1, or Mag </= 1 Physician:  Forbes Cellar Cal Gindlesperger 06/22/2023 4:07 AM

## 2023-06-22 NOTE — Progress Notes (Addendum)
 NAME:  Brendan Preston, MRN:  [id number], DOB:  October 18, 1975, LOS: 9 ADMISSION DATE:  06/13/2023, CONSULTATION DATE:  06/13/23 REFERRING MD:  Dr. Doran Durand, CHIEF COMPLAINT:  AMS   History of Present Illness:  Pt encephalopathic, therefore HPI obtained from chart review.  See additional chart w/MRN [id number]  48 yoM with prior hx of known polysubstance (heroin/ fentanyl) abuse in which EMS was called out for AMS, last seen awake 3 days ago covered in urine and feces.   In ER, temp 104.3 rectal, tachycardic, normo to slightly hypertensive, sats 100% on NRB> but maintaining sats on RA with GCS ~10 but protecting airway.  Labs significant for lactic 8.4, WBC 15, plts 96, VBG 7.29/ 44,  CXR neg, RVP neg, EKG without ST changes, qtc borderline, CTH official read pending, trop hs 694, INR 1.3.  Foley placed- urine cloudy, glucose 150, Hgb 150 (-RBC), neg leuks/nitrates, WBC 6-10, UDS positive for THC and opiates.  CMET and other labs had to be redrawn and still awaiting due to hemolysis.  Cultures sent, empiric vanc/ cefepime given, 2L LR.  PCCM called for admit.  Pertinent  Medical History  Tobacco abuse, polysubstance abuse/ IVDA, ETOH unclear  Significant Hospital Events: Including procedures, antibiotic start and stop dates in addition to other pertinent events   3/7 admit, encephalopathic, altered, drug rash developed after cefepime 3/8 TTE negative for endocarditis 3/10 he had a TEE scheduled but sounds like he has declined this . Larey Seat out of bed late day shift and broke his nose. Bcx still pos. 3/11 Tubed in afternoon. CRP high.  3/12 changing his pain reg/ PAD 3/13 on vent 3/14 CT abdomen showed left pneumothorax, bilateral lower lobe consolidation, multiple cavitary lesions, suspicious for bilateral common iliac DVT, fluid collection along L4/L5 vertebral bodies 3/14 left pigtail chest tube for pneumothorax 3/15 started on IV heparin, venous duplex negative, MRI lumbar spine suggests L5  osteo/no epidural abscess  Interim History / Subjective:   Remains critically ill, intubated Febrile 102 .2 max yesterday, low-grade today Sedate and calm on Precedex/fentanyl Good urine output  Objective   Blood pressure (!) 142/69, pulse 79, temperature (!) 100.8 F (38.2 C), temperature source Bladder, resp. rate (!) 23, height (P) 5\' 11"  (1.803 m), weight 67 kg, SpO2 99%.    Vent Mode: PRVC FiO2 (%):  [60 %-70 %] 70 % Set Rate:  [16 bmp] 16 bmp Vt Set:  [550 mL] 550 mL PEEP:  [5 cmH20] 5 cmH20 Plateau Pressure:  [15 cmH20-26 cmH20] 19 cmH20   Intake/Output Summary (Last 24 hours) at 06/22/2023 0865 Last data filed at 06/22/2023 7846 Gross per 24 hour  Intake 2852.02 ml  Output 2150 ml  Net 702.02 ml   Filed Weights   06/19/23 0400 06/20/23 0500 06/21/23 0711  Weight: 66.6 kg 63.9 kg 67 kg    Examination: Gen: Critically and chronically ill middle aged M NAD  ENT: Nose is bruised. pink mm ETT GT secure  CV: S1-S2 tacky, no murmur Resp: Bilateral ventilated breath sounds, decreased rhonchi, no accessory muscle use, no airleak on left pigtail, peak pressure 30s Abd: thin soft borderline hypoactive  MSK: no acute joint deformity  Neuro: RASS 0 to +1 awakens follows commands , moves all extremities  Labs show hypokalemia 3.5, decreasing leukocytosis, slight drop in hemoglobin to 7.4 from 8.2  Resolved Hospital Problem list   Cefepime drug rash Encephalopathy  AKI  Shock  Assessment & Plan:   Septic shock, improved MRSA bacteremia , TEE negative  for endocarditis, follow-up cultures 3/9 showed clearing Septic emboli Persistent fever -lumbar osteo suggested on MRI -Id following -vanc  -Repeat respiratory culture for HAP  ARDS Septic emboli Bilat PNA, MRSA  Left Pneumothorax P -Low tidal volume ventilation, FiO2 up to 70% and gradually being decreased, add PEEP if unable to  -not ready for weaning yet -VAP, pulm hygiene  -Continue pigtail to suction  Acute  BiV dysfunction -EF 40 to 45% Possible PFO  -TEE w possible small PFO grade 1 L to R shunt  P -supportive care -consider repeat TTE   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 -venous duplex legs negative Continue IV heparin  Polysubstance abuse Opiate dependence    [facility] oxycodone 10 every 4 -Continue Precedex and fentanyl drip -RASS goal 0 to -1 -Delirium precautions -cessation support, TOC consult    Fall, comminuted nasal bone fx -D/w ENT, will see him as outpatient when stable  Protein calorie malnutrition, mild -Tube feeds to goal    Best Practice (right click and "Reselect all SmartList Selections" daily)   Diet/type: tubefeeds  DVT prophylaxis prophylactic heparin  Pressure ulcer(s): N/A GI prophylaxis: PPI Lines: N/A Foley:  Yes, and it is still needed Code Status:  full code Last date of multidisciplinary goals of care discussion-parents updated at bedside  CRITICAL CARE Performed by: Comer Locket Zyon Grout   Total critical care time: 35 minutes  Critical care time was exclusive of separately billable procedures and treating other patients.  Critical care was necessary to treat or prevent imminent or life-threatening deterioration.  Critical care was time spent personally by me on the following activities: development of treatment plan with patient and/or surrogate as well as nursing, discussions with consultants, evaluation of patient's response to treatment, examination of patient, obtaining history from patient or surrogate, ordering and performing treatments and interventions, ordering and review of laboratory studies, ordering and review of radiographic studies, pulse oximetry and re-evaluation of patient's condition.  Comer Locket Vassie Loll MD [facility] Pulmonary/Critical Care Medicine Amion for pager 06/22/2023, 9:18 AM

## 2023-06-22 NOTE — Progress Notes (Signed)
 PHARMACY - ANTICOAGULATION CONSULT NOTE  Pharmacy Consult for IV heparin Indication: DVT  Allergies  Allergen Reactions   Penicillins Other (See Comments)    Noted on prior chart. Rash to cefepime 06/13/23, but tolerated ceftriaxone in 2020. Mother is unsure if he is truly allergic to penicillin. (2025)   Cefepime Rash    Patient Measurements: Height: (P) 5\' 11"  (180.3 cm) Weight: 67 kg (147 lb 11.3 oz) IBW/kg (Calculated) : (P) 75.3 Heparin Dosing Weight: 67 kg   Vital Signs: Temp: 99 F (37.2 C) (03/16 1016) Temp Source: Bladder (03/16 1016) BP: 188/90 (03/16 1000) Pulse Rate: 79 (03/16 0900)  Labs: Recent Labs    06/20/23 0538 06/21/23 0548 06/22/23 0326 06/22/23 0327 06/22/23 1016  HGB  --  8.2* 7.4*  --   --   HCT  --  26.2* 24.2*  --   --   PLT  --  362 313  --   --   HEPARINUNFRC  --   --   --  <0.10* <0.10*  CREATININE 0.98  0.92 1.10 1.10  --   --     Estimated Creatinine Clearance: 77.8 mL/min (by C-G formula based on SCr of 1.1 mg/dL).   Medical History: Past Medical History:  Diagnosis Date   Hepatitis C     Medications:  No medications prior to admission.    Assessment: Pharmacy is consulted to dose heparin in 48 yo male newly diagnosed with DVT. Pt has been admitted. CT of abdomen is suspicious for DVT.   Today, 06/22/23 Heparin level  < 0.1 (subtherapeutic) Hgb = 7.4, Pltc 313 No line or bleeding issues note per RN    Goal of Therapy:  Heparin level 0.3-0.7 units/ml Monitor platelets by anticoagulation protocol: Yes   Plan:  Heparin 2000 unit bolus IV x 1 Increase heparin gtt to 1550 units/hr  Obtain HL 6 hours after rate increase Daily CBC< HL Monitor for signs and symptoms of bleeding   Adalberto Cole, PharmD, BCPS 06/22/2023 11:16 AM

## 2023-06-22 NOTE — Progress Notes (Addendum)
 PHARMACY - ANTICOAGULATION CONSULT NOTE  Pharmacy Consult for IV heparin Indication: DVT  Allergies  Allergen Reactions   Penicillins Other (See Comments)    Noted on prior chart. Rash to cefepime 06/13/23, but tolerated ceftriaxone in 2020. Mother is unsure if he is truly allergic to penicillin. (2025)   Cefepime Rash    Patient Measurements: Height: (P) 5\' 11"  (180.3 cm) Weight: 67 kg (147 lb 11.3 oz) IBW/kg (Calculated) : (P) 75.3 Heparin Dosing Weight: 67 kg   Vital Signs: Temp: 101.5 F (38.6 C) (03/15 1832) Temp Source: Bladder (03/15 1832) Pulse Rate: 103 (03/16 0322)  Labs: Recent Labs    06/19/23 0908 06/20/23 0538 06/21/23 0548 06/22/23 0326 06/22/23 0327  HGB 9.7*  --  8.2* 7.4*  --   HCT 29.5*  --  26.2* 24.2*  --   PLT 342  --  362 313  --   HEPARINUNFRC  --   --   --   --  <0.10*  CREATININE  --  0.98  0.92 1.10 1.10  --     Estimated Creatinine Clearance: 77.8 mL/min (by C-G formula based on SCr of 1.1 mg/dL).   Medical History: Past Medical History:  Diagnosis Date   Hepatitis C     Medications:  No medications prior to admission.    Assessment: Pharmacy is consulted to dose heparin in 48 yo male newly diagnosed with DVT. Pt has been admitted. CT of abdomen is suspicious for DVT.   Today, 06/22/23 Reported from 2nd shift that heparin infusion had been paused for ~2 h hours to go to MRI and resumed ~ 1900 3/15 Heparin level ordered for 0100; level drawn @ 0327 and resulted @ 0340 = < 0.1 (subtherapeutic) RN confirmed heparin gtt infusing without interruption @ 1150 units/hr Hgb = 7.4, Pltc 313 No complications of therapy noted   Goal of Therapy:  Heparin level 0.3-0.7 units/ml Monitor platelets by anticoagulation protocol: Yes   Plan:  Heparin 2000 unit bolus IV x 1 Increase heparin gtt to 1350 units/hr  Obtain HL 6 hours after rate increase Daily CBC< HL Monitor for signs and symptoms of bleeding   Terrilee Files,  PharmD 06/22/2023 4:00 AM

## 2023-06-22 NOTE — Progress Notes (Signed)
 PHARMACY - ANTICOAGULATION CONSULT NOTE  Pharmacy Consult for IV heparin Indication: DVT  Allergies  Allergen Reactions   Penicillins Other (See Comments)    Noted on prior chart. Rash to cefepime 06/13/23, but tolerated ceftriaxone in 2020. Mother is unsure if he is truly allergic to penicillin. (2025)   Cefepime Rash    Patient Measurements: Height: (P) 5\' 11"  (180.3 cm) Weight: 67 kg (147 lb 11.3 oz) IBW/kg (Calculated) : (P) 75.3 Heparin Dosing Weight: 67 kg   Vital Signs: Temp: 98.6 F (37 C) (03/16 1850) Temp Source: Bladder (03/16 1850) BP: 170/127 (03/16 1800) Pulse Rate: 99 (03/16 1800)  Labs: Recent Labs    06/20/23 0538 06/21/23 0548 06/22/23 0326 06/22/23 0327 06/22/23 1016 06/22/23 1835  HGB  --  8.2* 7.4*  --   --   --   HCT  --  26.2* 24.2*  --   --   --   PLT  --  362 313  --   --   --   HEPARINUNFRC  --   --   --  <0.10* <0.10* <0.10*  CREATININE 0.98  0.92 1.10 1.10  --   --   --     Estimated Creatinine Clearance: 77.8 mL/min (by C-G formula based on SCr of 1.1 mg/dL).   Medical History: Past Medical History:  Diagnosis Date   Hepatitis C     Medications:  No medications prior to admission.    Assessment: Pharmacy is consulted to dose heparin in 48 yo male newly diagnosed with DVT. CT of abdomen is suspicious for DVT within the bilateral common iliac veins, right greater than left.  Lower extremity ultrasound negative for DVT. Patient continues on heparin.  Today, 06/22/23 Heparin level remains subtherapeutic/undetectable with heparin infusion at 1550 units/hr Hgb 7.4, Plt 313 No complications of therapy noted   Goal of Therapy:  Heparin level 0.3-0.7 units/ml Monitor platelets by anticoagulation protocol: Yes   Plan:  Repeat heparin 2000 unit bolus Increase heparin infusion to 1800 units/hr  Obtain HL 6 hours after rate increase Daily CBC, HL Monitor for signs and symptoms of bleeding Consider transitioning to Lovenox as  heparin remains subtherapeutic despite bolus/rate increases  Pricilla Riffle, PharmD, BCPS Clinical Pharmacist 06/22/2023 7:12 PM

## 2023-06-22 NOTE — Progress Notes (Signed)
 Pharmacy Antibiotic Note  Brendan Preston is a 48 y.o. male admitted on 06/13/2023 with sepsis, found at home lethargic and not responsive, history of polysubstance abuse.  Treating MRSA bacteremia with suspected septic emboli.  TEE pending.  Pharmacy has been consulted for Vancomycin dosing.  AKI noted on admission.  No baseline available.  SCr improving and now near WNL at 1.10    Plan: Continue 750 mg IV q12h.  Goal AUC 400-550 Follow up renal function, culture results, and clinical course.   Height: (P) 5\' 11"  (180.3 cm) Weight: 67 kg (147 lb 11.3 oz) IBW/kg (Calculated) : (P) 75.3  Temp (24hrs), Avg:100.8 F (38.2 C), Min:98.8 F (37.1 C), Max:103.1 F (39.5 C)  Recent Labs  Lab 06/17/23 0300 06/18/23 0338 06/18/23 1918 06/19/23 0817 06/19/23 0908 06/19/23 1312 06/20/23 0538 06/21/23 0548 06/22/23 0326  WBC  --  22.9* 19.5*  --  16.1*  --   --  17.3* 16.3*  CREATININE 1.43* 1.25*  --   --   --   --  0.98  0.92 1.10 1.10  VANCOTROUGH  --   --   --  18  --   --   --   --   --   VANCOPEAK  --   --   --   --   --  26*  --   --   --     Estimated Creatinine Clearance: 77.8 mL/min (by C-G formula based on SCr of 1.1 mg/dL).    Allergies  Allergen Reactions   Penicillins Other (See Comments)    Noted on prior chart. Rash to cefepime 06/13/23, but tolerated ceftriaxone in 2020. Mother is unsure if he is truly allergic to penicillin. (2025)   Cefepime Rash    Antimicrobials this admission: 3/7 cefepime x1 3/7 vancomycin >> 3/7 Rocephin >> 3/8   Dose adjustments this admission: 3/8 Vancomycin level (random ~12 hr level, drawn 02:49): 17, redose vanc.  3/10 empiric vanc renal adjustment 3/11 empiric vanc renal adjustment.  3/13 levels performed - continue 750mg  IV q12h   Microbiology results: 3/7 Resp panel: negative covid, flu, rsv 3/7 BCx: 4/4 bottles Staph aureus 3/7 BCID:  MRSA 3/7 UCx: NGF  3/7 LP: unable to read d/t blood 3/7 Meningitis/Encephalitis Panel  (CSF): none detected 3/9 BCx: NGTD 3/9 Hepatitis panel: HCV Ab positive  3/12 Resp culture: few Staph aureus 3/12 HCV RNA: not detected  3/16 Resp culture:    Adalberto Cole, PharmD, BCPS 06/22/2023 12:55 PM

## 2023-06-23 ENCOUNTER — Inpatient Hospital Stay (HOSPITAL_COMMUNITY): Payer: MEDICAID

## 2023-06-23 DIAGNOSIS — I5021 Acute systolic (congestive) heart failure: Secondary | ICD-10-CM

## 2023-06-23 DIAGNOSIS — J80 Acute respiratory distress syndrome: Secondary | ICD-10-CM

## 2023-06-23 DIAGNOSIS — J850 Gangrene and necrosis of lung: Secondary | ICD-10-CM

## 2023-06-23 LAB — PHOSPHORUS: Phosphorus: 3.3 mg/dL (ref 2.5–4.6)

## 2023-06-23 LAB — BASIC METABOLIC PANEL WITH GFR
Anion gap: 6 (ref 5–15)
BUN: 22 mg/dL — ABNORMAL HIGH (ref 6–20)
CO2: 24 mmol/L (ref 22–32)
Calcium: 6.7 mg/dL — ABNORMAL LOW (ref 8.9–10.3)
Chloride: 112 mmol/L — ABNORMAL HIGH (ref 98–111)
Creatinine, Ser: 1.03 mg/dL (ref 0.61–1.24)
GFR, Estimated: >60 mL/min (ref >60)
Glucose, Bld: 202 mg/dL — ABNORMAL HIGH (ref 70–99)
Potassium: 3.4 mmol/L — ABNORMAL LOW (ref 3.5–5.1)
Sodium: 142 mmol/L (ref 135–145)

## 2023-06-23 LAB — CBC
HCT: 23.3 % — ABNORMAL LOW (ref 39.0–52.0)
Hemoglobin: 7.1 g/dL — ABNORMAL LOW (ref 13.0–17.0)
MCH: 27.4 pg (ref 26.0–34.0)
MCHC: 30.5 g/dL (ref 30.0–36.0)
MCV: 90 fL (ref 80.0–100.0)
Platelets: 272 10*3/uL (ref 150–400)
RBC: 2.59 MIL/uL — ABNORMAL LOW (ref 4.22–5.81)
RDW: 18.4 % — ABNORMAL HIGH (ref 11.5–15.5)
WBC: 13.1 10*3/uL — ABNORMAL HIGH (ref 4.0–10.5)
nRBC: 0 % (ref 0.0–0.2)

## 2023-06-23 LAB — GLUCOSE, CAPILLARY
Glucose-Capillary: 180 mg/dL — ABNORMAL HIGH (ref 70–99)
Glucose-Capillary: 104 mg/dL — ABNORMAL HIGH (ref 70–99)
Glucose-Capillary: 117 mg/dL — ABNORMAL HIGH (ref 70–99)
Glucose-Capillary: 141 mg/dL — ABNORMAL HIGH (ref 70–99)
Glucose-Capillary: 122 mg/dL — ABNORMAL HIGH (ref 70–99)
Glucose-Capillary: 125 mg/dL — ABNORMAL HIGH (ref 70–99)

## 2023-06-23 LAB — VANCOMYCIN, PEAK: Vancomycin Pk: 31 ug/mL (ref 30–40)

## 2023-06-23 LAB — HEPARIN LEVEL (UNFRACTIONATED): Heparin Unfractionated: <0.1 [IU]/mL — ABNORMAL LOW (ref 0.30–0.70)

## 2023-06-23 LAB — VANCOMYCIN, TROUGH: Vancomycin Tr: 18 ug/mL (ref 15–20)

## 2023-06-23 MED ORDER — HEPARIN BOLUS VIA INFUSION
2000.0000 [IU] | Freq: Once | INTRAVENOUS | Status: AC
  Administered 2023-06-23: 2000 [IU] via INTRAVENOUS
  Filled 2023-06-23: qty 2000

## 2023-06-23 MED ORDER — SODIUM CHLORIDE 3 % IN NEBU
4.0000 mL | INHALATION_SOLUTION | Freq: Two times a day (BID) | RESPIRATORY_TRACT | Status: AC
  Administered 2023-06-23 – 2023-06-25 (×6): 4 mL via RESPIRATORY_TRACT
  Filled 2023-06-23 (×6): qty 4

## 2023-06-23 MED ORDER — HYDROMORPHONE HCL-NACL 50-0.9 MG/50ML-% IV SOLN
0.5000 mg/h | INTRAVENOUS | Status: DC
  Administered 2023-06-23 (×2): 2 mg/h via INTRAVENOUS
  Administered 2023-06-24: 1.5 mg/h via INTRAVENOUS
  Administered 2023-06-26: 1 mg/h via INTRAVENOUS
  Filled 2023-06-23 (×4): qty 50

## 2023-06-23 MED ORDER — SODIUM CHLORIDE 0.9 % IV SOLN
1.0000 g | Freq: Once | INTRAVENOUS | Status: AC
  Administered 2023-06-23: 1 g via INTRAVENOUS
  Filled 2023-06-23: qty 20

## 2023-06-23 MED ORDER — POTASSIUM CHLORIDE 20 MEQ PO PACK
40.0000 meq | PACK | Freq: Once | ORAL | Status: AC
  Administered 2023-06-23: 40 meq
  Filled 2023-06-23: qty 2

## 2023-06-23 MED ORDER — FENTANYL 2500MCG IN NS 250ML (10MCG/ML) PREMIX INFUSION: 0.0000 ug/h | INTRAVENOUS | Status: DC

## 2023-06-23 MED ORDER — ENOXAPARIN SODIUM 80 MG/0.8ML IJ SOSY
70.0000 mg | PREFILLED_SYRINGE | Freq: Two times a day (BID) | INTRAMUSCULAR | Status: DC
  Administered 2023-06-23 – 2023-06-30 (×15): 70 mg via SUBCUTANEOUS
  Filled 2023-06-23 (×15): qty 0.8

## 2023-06-23 MED ORDER — SODIUM CHLORIDE 0.9 % IV SOLN
1.0000 g | Freq: Three times a day (TID) | INTRAVENOUS | Status: DC
  Administered 2023-06-23 – 2023-06-25 (×5): 1 g via INTRAVENOUS
  Filled 2023-06-23 (×5): qty 20

## 2023-06-23 MED ORDER — FENTANYL 2500MCG IN NS 250ML (10MCG/ML) PREMIX INFUSION
0.0000 ug/h | INTRAVENOUS | Status: DC
Start: 2023-06-23 — End: 2023-06-23
  Administered 2023-06-23: 200 ug/h via INTRAVENOUS
  Filled 2023-06-23: qty 250

## 2023-06-23 NOTE — Progress Notes (Incomplete)
 NAME:  Brendan Preston, MRN:  [id number], DOB:  May 15, 1975, LOS: 10 ADMISSION DATE:  06/13/2023, CONSULTATION DATE:  06/13/23 REFERRING MD:  Dr. Doran Durand, CHIEF COMPLAINT:  AMS   History of Present Illness:  Pt encephalopathic, therefore HPI obtained from chart review.  See additional chart w/MRN [id number]  13 yoM with prior hx of known polysubstance (heroin/ fentanyl) abuse in which EMS was called out for AMS, last seen awake 3 days ago covered in urine and feces.   In ER, temp 104.3 rectal, tachycardic, normo to slightly hypertensive, sats 100% on NRB> but maintaining sats on RA with GCS ~10 but protecting airway.  Labs significant for lactic 8.4, WBC 15, plts 96, VBG 7.29/ 44,  CXR neg, RVP neg, EKG without ST changes, qtc borderline, CTH official read pending, trop hs 694, INR 1.3.  Foley placed- urine cloudy, glucose 150, Hgb 150 (-RBC), neg leuks/nitrates, WBC 6-10, UDS positive for THC and opiates.  CMET and other labs had to be redrawn and still awaiting due to hemolysis.  Cultures sent, empiric vanc/ cefepime given, 2L LR.  PCCM called for admit.  Pertinent  Medical History  Tobacco abuse, polysubstance abuse/ IVDA, ETOH unclear  Significant Hospital Events: Including procedures, antibiotic start and stop dates in addition to other pertinent events   3/7 admit, encephalopathic, altered, drug rash developed after cefepime 3/8 TTE negative for endocarditis 3/10 he had a TEE scheduled but sounds like he has declined this . Larey Seat out of bed late day shift and broke his nose. Bcx still pos. 3/11 Tubed in afternoon. CRP high.  3/12 changing his pain reg/ PAD 3/13 on vent 3/14 CT abdomen showed left pneumothorax, bilateral lower lobe consolidation, multiple cavitary lesions, suspicious for bilateral common iliac DVT, fluid collection along L4/L5 vertebral bodies 3/14 left pigtail chest tube for pneumothorax 3/15 started on IV heparin, venous duplex negative, MRI lumbar spine suggests L5  osteo/no epidural abscess  Interim History / Subjective:   Remains on the ventilator, Precedex drip, fentanyl drip and heparin   Objective   Blood pressure (!) 109/52, pulse 69, temperature 98.6 F (37 C), resp. rate 16, height (P) 5\' 11"  (1.803 m), weight 67 kg, SpO2 100%.    Vent Mode: PRVC FiO2 (%):  [40 %-60 %] 40 % Set Rate:  [16 bmp] 16 bmp Vt Set:  [550 mL] 550 mL PEEP:  [5 cmH20] 5 cmH20 Plateau Pressure:  [20 cmH20-22 cmH20] 20 cmH20   Intake/Output Summary (Last 24 hours) at 06/23/2023 6295 Last data filed at 06/23/2023 2841 Gross per 24 hour  Intake 5263.97 ml  Output 1385 ml  Net 3878.97 ml   Filed Weights   06/19/23 0400 06/20/23 0500 06/21/23 0711  Weight: 66.6 kg 63.9 kg 67 kg   Examination: Gen:      No acute distress HEENT:  EOMI, sclera anicteric Neck:     No masses; no thyromegaly Lungs:    Clear to auscultation bilaterally; normal respiratory effort*** CV:         Regular rate and rhythm; no murmurs Abd:      + bowel sounds; soft, non-tender; no palpable masses, no distension Ext:    No edema; adequate peripheral perfusion Skin:      Warm and dry; no rash Neuro: alert and oriented x 3 Psych: normal mood and affect   Lab/imaging reviewed Significant for potassium 3.4, WBC 13.1, hemoglobin 7.1 Chest x-ray with bilateral infiltrates, cavitary lesions  Gen: Critically and chronically ill middle aged M NAD  ENT: Nose is bruised. pink mm ETT GT secure  CV: S1-S2 tacky, no murmur Resp: Bilateral ventilated breath sounds, decreased rhonchi, no accessory muscle use, no airleak on left pigtail, peak pressure 30s Abd: thin soft borderline hypoactive  MSK: no acute joint deformity  Neuro: RASS 0 to +1 awakens follows commands , moves all extremities  Labs show hypokalemia 3.5, decreasing leukocytosis, slight drop in hemoglobin to 7.4 from 8.2  Resolved Hospital Problem list   Cefepime drug rash Encephalopathy  AKI  Shock  Assessment & Plan:   Septic  shock, improved MRSA bacteremia , TEE negative for endocarditis, follow-up cultures 3/9 showed clearing Septic emboli Persistent fever -lumbar osteo suggested on MRI Continue vancomycin per infectious disease, follow tracheal aspirate cultures  ARDS Septic emboli Bilat PNA, MRSA  Left Pneumothorax P Continue vent support,  40% FiO2 and 5 of PEEP now Start pressure support weans as able Left pigtail chest tube catheter to suction Follow daily chest x-ray  Acute BiV dysfunction -EF 40 to 45% Possible PFO  -TEE w possible small PFO grade 1 L to R shunt  P Continue supportive care May need repeat TTE to ensure recovery  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 -venous duplex legs negative Continue IV heparin  Polysubstance abuse Opiate dependence    -[facility] oxycodone 10 every 4 -Continue Precedex and fentanyl drip -RASS goal 0 to -1 -Delirium precautions -cessation support, TOC consult    Fall, comminuted nasal bone fx -D/w ENT, will see him as outpatient when stable  Protein calorie malnutrition, mild.  Present on admission -Tube feeds to goal    Best Practice (right click and "Reselect all SmartList Selections" daily)   Diet/type: tubefeeds  DVT prophylaxis prophylactic heparin  Pressure ulcer(s): N/A GI prophylaxis: PPI Lines: Central line, chest tube Foley:  Yes, and it is still needed Code Status:  full code Last date of multidisciplinary goals of care discussion-parents updated at bedside  The patient is critically ill with multiple organ system failure and requires high complexity decision making for assessment and support, frequent evaluation and titration of therapies, advanced monitoring, review of radiographic studies and interpretation of complex data.   Critical Care Time devoted to patient care services, exclusive of separately billable procedures, described in this note is 35 minutes.   Chilton Greathouse MD [facility] See Amion for pager  If  no response to pager , please call [phone] until 7pm After 7:00 pm call Elink  [phone] 06/23/2023, 9:24 AM

## 2023-06-23 NOTE — Progress Notes (Signed)
 Physical Therapy Discharge Patient Details Name: Brendan Preston MRN: [id number] DOB: 12/07/1975 Today's Date: 06/23/2023 Time:  -     Patient discharged from PT services secondary to medical decline - will need to re-order PT to resume therapy services.  [name] PT Acute Rehabilitation Services Office [phone]   GP     [name] 06/23/2023, 7:26 AM

## 2023-06-23 NOTE — Progress Notes (Signed)
 PHARMACY - ANTICOAGULATION CONSULT NOTE  Pharmacy Consult for IV heparin Indication: DVT  Allergies  Allergen Reactions   Penicillins Other (See Comments)    Noted on prior chart. Rash to cefepime 06/13/23, but tolerated ceftriaxone in 2020. Mother is unsure if he is truly allergic to penicillin. (2025)   Cefepime Rash    Patient Measurements: Height: (P) 5\' 11"  (180.3 cm) Weight: 67 kg (147 lb 11.3 oz) IBW/kg (Calculated) : (P) 75.3 Heparin Dosing Weight: 67 kg   Vital Signs: Temp: 98.6 F (37 C) (03/16 1850) Temp Source: Bladder (03/16 1850) BP: 105/56 (03/17 0100) Pulse Rate: 62 (03/17 0100)  Labs: Recent Labs    06/21/23 0548 06/22/23 0326 06/22/23 0327 06/22/23 1016 06/22/23 1835 06/23/23 0310 06/23/23 0314  HGB 8.2* 7.4*  --   --   --   --  7.1*  HCT 26.2* 24.2*  --   --   --   --  23.3*  PLT 362 313  --   --   --   --  272  HEPARINUNFRC  --   --    < > <0.10* <0.10* <0.10*  --   CREATININE 1.10 1.10  --   --   --   --  1.03   < > = values in this interval not displayed.    Estimated Creatinine Clearance: 83.1 mL/min (by C-G formula based on SCr of 1.03 mg/dL).   Medical History: Past Medical History:  Diagnosis Date   Hepatitis C     Medications:  No medications prior to admission.    Assessment: Pharmacy is consulted to dose heparin in 48 yo male newly diagnosed with DVT. CT of abdomen is suspicious for DVT within the bilateral common iliac veins, right greater than left.  Lower extremity ultrasound negative for DVT. Patient continues on heparin.  Today, 06/23/23 Heparin level remains subtherapeutic/undetectable with heparin infusion at 1800 units/hr Hgb 7.1, Plt 272 No complications of therapy noted. No infusion line issues per RN   Goal of Therapy:  Heparin level 0.3-0.7 units/ml Monitor platelets by anticoagulation protocol: Yes   Plan:  Repeat heparin 2000 unit bolus Increase heparin infusion to 2000 units/hr  Obtain HL 6 hours after  rate increase Daily CBC, HL Monitor for signs and symptoms of bleeding Consider transitioning to Lovenox as heparin remains subtherapeutic despite bolus/rate increases  Maryellen Pile, PharmD Clinical Pharmacist 06/23/2023 3:42 AM

## 2023-06-23 NOTE — Progress Notes (Signed)
 Pharmacy Antibiotic Note  Brendan Preston is a 48 y.o. male admitted on 06/13/2023 with bacteremia.  Pharmacy has been consulted for Vancomycin + Meropenem dosing.  A Vanc peak/trough drawn before and after the dose today are 18/31 mcg/ml respectively for a calculated AUC at 600 which is at the higher end of the therapeutic range.   Will hold the course at the current dosing for now, dropping the dose to 500 mg/12h has an estimated AUC of 400 which is at the lower end of therapeutic range. Dropping to 1250 mg/24h will make the trough <15 mcg/ml.   Plan: - Continue Vancomycin 750 mg IV every 12 hours - Add Meropenem 1g IV every 8 hours to cover PsA in RCx - Will continue to follow renal function, culture results, LOT, and antibiotic de-escalation plans   Height: (P) 5\' 11"  (180.3 cm) Weight: 67 kg (147 lb 11.3 oz) IBW/kg (Calculated) : (P) 75.3  Temp (24hrs), Avg:99.7 F (37.6 C), Min:98.6 F (37 C), Max:102.2 F (39 C)  Recent Labs  Lab 06/18/23 0338 06/18/23 1918 06/19/23 0817 06/19/23 0908 06/19/23 1312 06/20/23 0538 06/21/23 0548 06/22/23 0326 06/23/23 0314 06/23/23 0907  WBC 22.9* 19.5*  --  16.1*  --   --  17.3* 16.3* 13.1*  --   CREATININE 1.25*  --   --   --   --  0.98  0.92 1.10 1.10 1.03  --   VANCOTROUGH  --   --  18  --   --   --   --   --   --  18  VANCOPEAK  --   --   --   --  26*  --   --   --   --   --     Estimated Creatinine Clearance: 83.1 mL/min (by C-G formula based on SCr of 1.03 mg/dL).    Allergies  Allergen Reactions   Penicillins Other (See Comments)    Noted on prior chart. Rash to cefepime 06/13/23, but tolerated ceftriaxone in 2020. Mother is unsure if he is truly allergic to penicillin. (2025)   Cefepime Rash    Antimicrobials this admission: Vancomycin 3/7 >> 3/17 Cefepime 3/7 x 1 Ceftriaxone 3/8 >> 3/9 Meropenem 3/17 >>  Dose adjustments this admission: 3/8 VR 17 mcg/ml 3/13 VP/VT 26/18 mcg/ml - AUC 543 3/17 VP/VT 31/18 mcg/ml -  AUC 600  Microbiology results: 3/7 BCx 4/4 MRSA 3/9 BCx >> NGf 3/12 RCx (TA) >> MRSA 3/16 RCx (TA) >> staph aureus + PsA  Thank you for allowing pharmacy to be a part of this patient's care.  Georgina Pillion, PharmD, BCPS, BCIDP Infectious Diseases Clinical Pharmacist 06/23/2023 2:00 PM   **Pharmacist phone directory can now be found on [email or web address]).  Listed under [facility].

## 2023-06-23 NOTE — Progress Notes (Signed)
 Nutrition Follow-up  DOCUMENTATION CODES:   Non-severe (moderate) malnutrition in context of chronic illness  INTERVENTION:  - Continue goal TF regimen: Vital 1.5 at 55 ml/h (1320 ml per day) Prosource TF20 60 ml daily Provides 2060 kcal, 109 gm protein, 1006 ml free water daily   - FWF per CCM. Currently ordered Q8H which in addition to TF provides 1630mL/day   - Monitor weight trends.   NUTRITION DIAGNOSIS:   Moderate Malnutrition related to chronic illness (polysubstance abuse) as evidenced by mild fat depletion, mild muscle depletion. *ongoing  GOAL:   Patient will meet greater than or equal to 90% of their needs *met with TF  MONITOR:   Vent status, Labs, Weight trends, TF tolerance  REASON FOR ASSESSMENT:   Ventilator    ASSESSMENT:   48 yo male with prior hx of known polysubstance (heroin/ fentanyl) abuse admitted for AMS.  3/7 Admit 3/11 Intubated; OGT placed 3/12 Vital 1.5 started @ 20  Patient remains intubated on ventilator support MV: 11.3 L/min Temp (24hrs), Avg:100.1 F (37.8 C), Min:98.6 F (37 C), Max:102.2 F (39 C)  Patient awake and alert at time of visit. Discussed tube feeds and patient denied any issues.  RN at bedside confirms TF's going well. Were supposed to increase to goal of 89mL/hr last night but patient remains at 50 today. RN to increase to 46mL/hr today.   Medications reviewed and include: Q8H FWF, Miralax, Senokot, 100mg  thiamine Precedex Fentanyl  Labs reviewed:  K+ 3.4   Diet Order:   Diet Order             Diet NPO time specified  Diet effective now                   EDUCATION NEEDS:  Not appropriate for education at this time  Skin:  Skin Assessment: Reviewed RN Assessment  Last BM:  3/16 - rectal tube  Height:  Ht Readings from Last 1 Encounters:  06/17/23 (P) 5\' 11"  (1.803 m)   Weight: Wt Readings from Last 1 Encounters:  06/21/23 67 kg    BMI:  Body mass index is 20.6 kg/m  (pended).  Estimated Nutritional Needs:  Kcal:  1900-2200 kcals Protein:  95-115 grams Fluid:  >/= 1.9L    Shelle Iron RD, LDN Contact via Secure Chat.

## 2023-06-23 NOTE — Progress Notes (Signed)
 eLink Physician-Brief Progress Note Patient Name: Brendan Preston DOB: 1975-11-15 MRN: [id number]   Date of Service  06/23/2023  HPI/Events of Note  89 yoM with prior hx of known polysubstance (heroin/ fentanyl) abuse in which EMS was called out for AMS, now in ARDS and septic shock.  Fentanyl expired  eICU Interventions  Renew drip     Intervention Category Minor Interventions: Agitation / anxiety - evaluation and management  Omega Durante 06/23/2023, 2:38 AM

## 2023-06-23 NOTE — Progress Notes (Addendum)
 NAME:  Brendan Preston, MRN:  [id number], DOB:  04-27-1975, LOS: 10 ADMISSION DATE:  06/13/2023, CONSULTATION DATE:  06/13/23 REFERRING MD:  Dr. Doran Durand, CHIEF COMPLAINT:  AMS   History of Present Illness:  Pt encephalopathic, therefore HPI obtained from chart review.  See additional chart w/MRN [id number]  29 yoM with prior hx of known polysubstance (heroin/ fentanyl) abuse in which EMS was called out for AMS, last seen awake 3 days ago covered in urine and feces.   In ER, temp 104.3 rectal, tachycardic, normo to slightly hypertensive, sats 100% on NRB> but maintaining sats on RA with GCS ~10 but protecting airway.  Labs significant for lactic 8.4, WBC 15, plts 96, VBG 7.29/ 44,  CXR neg, RVP neg, EKG without ST changes, qtc borderline, CTH official read pending, trop hs 694, INR 1.3.  Foley placed- urine cloudy, glucose 150, Hgb 150 (-RBC), neg leuks/nitrates, WBC 6-10, UDS positive for THC and opiates.  CMET and other labs had to be redrawn and still awaiting due to hemolysis.  Cultures sent, empiric vanc/ cefepime given, 2L LR.  PCCM called for admit.  Pertinent  Medical History  Tobacco abuse, polysubstance abuse/ IVDA, ETOH unclear  Significant Hospital Events: Including procedures, antibiotic start and stop dates in addition to other pertinent events   3/7 admit, encephalopathic, altered, drug rash developed after cefepime 3/8 TTE negative for endocarditis 3/10 he had a TEE scheduled but sounds like he has declined this . Larey Seat out of bed late day shift and broke his nose. Bcx still pos. 3/11 Tubed in afternoon. CRP high.  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 Still having marked trach secretions   Interim History / Subjective:  Intubated on precedex. Complaining of back pain. coughing  Objective   Blood pressure (!) 109/52, pulse 69, temperature 98.6 F (37 C), resp. rate 16, height (P) 5\' 11"  (1.803 m), weight 67 kg, SpO2 100%.    Vent Mode: PRVC FiO2 (%):  [40 %-60 %] 40 % Set Rate:  [16 bmp] 16 bmp Vt Set:  [550 mL] 550 mL PEEP:  [5 cmH20] 5 cmH20 Plateau Pressure:  [21 cmH20-22 cmH20] 22 cmH20   Intake/Output Summary (Last 24 hours) at 06/23/2023 0916 Last data filed at 06/23/2023 0704 Gross per 24 hour  Intake 5263.97 ml  Output 1385 ml  Net 3878.97 ml   Filed Weights   06/19/23 0400 06/20/23 0500 06/21/23 0711  Weight: 66.6 kg 63.9 kg 67 kg    Examination: Gen: ill appearing, thin, in no acute distress ENT:  ET tube present. Bruising and abrasion on nose, thick brown secretions from ETT CV: RRR Resp: bilateral course rhonchi, w' tactile frem no accessory muscle use. PCXR w/ worsening aeration on right. Left a little better no obvious PTX CT no airleak  Abd: soft, non-tender Extremities: mild non-pitting edema in all extremities  Neuro: responsive to voice, oriented x3 no focal motor def   Resolved Hospital Problem list   Cefepime drug rash Encephalopathy  AKI  Shock  Assessment & Plan:   Sepsis with MRSA bacteremia, Cavitary pneumonia MRSA and then pseudomonas, and septic emboli and osteo to  spine TEE neg for endocarditis. Repeat blood cx from 3/9 no growth. Sputum cx 3/16 with few gpc and few gnr. Currently afebrile. WBC 13.1. ID following Plan Continue Vanc day 11/x Adding GNR coverage given CXR worse  Add meropenem given cxr worse and rash w/ cefepime   Acute hypoxic respiratory failure due to ARDS with necrotizing MRSA PNA, septic emboli and pseudomonas PNA Left Pneumothorax Chest x ray this AM with worsening right  airspace opacities. Maybe slight improvement on the left. Chest tube still in place with 10ml out last night. Plan Cont full vent support  Treating PNA VAP bundle  PAD protocol RASS goal -1 Pulm hygiene (add back HT saline) May need trach  CT to water seal today assess for removal over next 24-48hrs  Am cxr   Acute HFrEF (45-50%) Possible PFO  TEE w possible small PFO grade 1 L to R shunt  Plan Consider repeat TTE after acute illness Cont tele  Daily assessment for diuretics   Fluid and electrolyte imbalance: hypokalemia Plan Replace and recheck   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 Neg LE duplex Plan  Changing to LMWH given heparin levels not able to be measured   Polysubstance abuse Opiate dependence Positive for opiates and THC on admission Plan Continue scheduled oxycodone  Continue precedex and rotating to fixed dose dilaudid  May benefit from ketamine at some point Rass goal 0/-1 Delirium precautions   Fall, comminuted nasal bone fx Plan ENT will see him as outpatient when stable  Protein calorie malnutrition, mild Plan Cont tube feeds  Anemia of critical illness Hgb 7.1 down from 7.4 yesterday. No signs of bleeding Plan Transfuse for hgb <7 or symptomatic with signs of bleeding.    Best Practice (right click and "Reselect all SmartList Selections" daily)   Diet/type: tubefeeds  DVT prophylaxis prophylactic heparin  Pressure ulcer(s): N/A GI prophylaxis: PPI Lines: N/A Foley:  Yes, and it is still needed Code Status:  full code Last date of multidisciplinary goals of care discussion-parents updated at bedside  My cct 32 min

## 2023-06-23 NOTE — Progress Notes (Signed)
 [facility] ADULT ICU REPLACEMENT PROTOCOL   The patient does apply for the [facility] Adult ICU Electrolyte Replacment Protocol based on the criteria listed below:   1.Exclusion criteria: TCTS, ECMO, Dialysis, and Myasthenia Gravis patients 2. Is GFR >/= 30 ml/min? Yes.    Patient's GFR today is >60 3. Is SCr </= 2? Yes.   Patient's SCr is 1.03 mg/dL 4. Did SCr increase >/= 0.5 in 24 hours? No. 5.Pt's weight >40kg  Yes.   6. Abnormal electrolyte(s): K  7. Electrolytes replaced per protocol 8.  Call MD STAT for K+ </= 2.5, Phos </= 1, or Mag </= 1 Physician:  Thersa Salt Ramello Cordial 06/23/2023 3:44 AM

## 2023-06-23 NOTE — Progress Notes (Signed)
 [facility]   Reason for visit: Follow up on fever  Interval History: WBC down to 13.1, Tmax 102.2.  remains on vent  Physical Exam: Constitutional:  Vitals:   06/23/23 0800 06/23/23 1045  BP:    Pulse:    Resp:    Temp: 98.6 F (37 C)   SpO2:  96%   patient appears in NAD, alert Eyes: anicteric HENT: +ET Respiratory: respiratory effort on vent  Review of Systems: Integument/breast: negative for rash  Lab Results  Component Value Date   WBC 13.1 (H) 06/23/2023   HGB 7.1 (L) 06/23/2023   HCT 23.3 (L) 06/23/2023   MCV 90.0 06/23/2023   PLT 272 06/23/2023    Lab Results  Component Value Date   CREATININE 1.03 06/23/2023   BUN 22 (H) 06/23/2023   NA 142 06/23/2023   K 3.4 (L) 06/23/2023   CL 112 (H) 06/23/2023   CO2 24 06/23/2023    Lab Results  Component Value Date   ALT 35 06/13/2023   AST 119 (H) 06/13/2023   ALKPHOS 64 06/13/2023     Microbiology: Recent Results (from the past 240 hours)  Culture, blood (Routine X 2) w Reflex to ID Panel     Status: Abnormal (Preliminary result)   Collection Time: 06/13/23  1:49 PM   Specimen: BLOOD  Result Value Ref Range Status   Specimen Description   Final    BLOOD SITE NOT SPECIFIED Performed at [facility], [address], [place], [place] [address]    Special Requests   Final    BOTTLES DRAWN AEROBIC AND ANAEROBIC Blood Culture adequate volume Performed at [facility], [address], [place], [place] [address]    Culture  Setup Time   Final    GRAM POSITIVE COCCI IN CLUSTERS IN BOTH AEROBIC AND ANAEROBIC BOTTLES PHARMD E JACKSON 06/14/2023 @ 0551 BY AB    Culture (A)  Final    METHICILLIN RESISTANT STAPHYLOCOCCUS AUREUS Sent to [facility] for further susceptibility testing. Performed at [facility], [address] [place], [place] [address]    Report Status PENDING  Incomplete   Organism ID, Bacteria METHICILLIN RESISTANT  STAPHYLOCOCCUS AUREUS  Final      Susceptibility   Methicillin resistant staphylococcus aureus - MIC*    CIPROFLOXACIN >=8 RESISTANT Resistant     ERYTHROMYCIN >=8 RESISTANT Resistant     GENTAMICIN <=0.5 SENSITIVE Sensitive     OXACILLIN >=4 RESISTANT Resistant     TETRACYCLINE <=1 SENSITIVE Sensitive     VANCOMYCIN 1 SENSITIVE Sensitive     TRIMETH/SULFA >=320 RESISTANT Resistant     CLINDAMYCIN <=0.25 SENSITIVE Sensitive     RIFAMPIN <=0.5 SENSITIVE Sensitive     Inducible Clindamycin NEGATIVE Sensitive     LINEZOLID 2 SENSITIVE Sensitive     * METHICILLIN RESISTANT STAPHYLOCOCCUS AUREUS  Blood Culture ID Panel (Reflexed)     Status: Abnormal   Collection Time: 06/13/23  1:49 PM  Result Value Ref Range Status   Enterococcus faecalis NOT DETECTED NOT DETECTED Final   Enterococcus Faecium NOT DETECTED NOT DETECTED Final   Listeria monocytogenes NOT DETECTED NOT DETECTED Final   Staphylococcus species DETECTED (A) NOT DETECTED Final    Comment: CRITICAL RESULT CALLED TO, READ BACK BY AND VERIFIED WITH: PHARMD E JACKSON 06/14/2023 @ 0551 BY AB    Staphylococcus aureus (BCID) DETECTED (A) NOT DETECTED Final    Comment: Methicillin (oxacillin)-resistant Staphylococcus aureus (MRSA). MRSA  is predictably resistant to beta-lactam antibiotics (except ceftaroline). Preferred therapy is vancomycin unless clinically contraindicated. Patient requires contact precautions if  hospitalized. CRITICAL RESULT CALLED TO, READ BACK BY AND VERIFIED WITH: PHARMD E JACKSON 06/14/2023 @ 0551 BY AB    Staphylococcus epidermidis NOT DETECTED NOT DETECTED Final   Staphylococcus lugdunensis NOT DETECTED NOT DETECTED Final   Streptococcus species NOT DETECTED NOT DETECTED Final   Streptococcus agalactiae NOT DETECTED NOT DETECTED Final   Streptococcus pneumoniae NOT DETECTED NOT DETECTED Final   Streptococcus pyogenes NOT DETECTED NOT DETECTED Final   A.calcoaceticus-baumannii NOT DETECTED NOT DETECTED Final    Bacteroides fragilis NOT DETECTED NOT DETECTED Final   Enterobacterales NOT DETECTED NOT DETECTED Final   Enterobacter cloacae complex NOT DETECTED NOT DETECTED Final   Escherichia coli NOT DETECTED NOT DETECTED Final   Klebsiella aerogenes NOT DETECTED NOT DETECTED Final   Klebsiella oxytoca NOT DETECTED NOT DETECTED Final   Klebsiella pneumoniae NOT DETECTED NOT DETECTED Final   Proteus species NOT DETECTED NOT DETECTED Final   Salmonella species NOT DETECTED NOT DETECTED Final   Serratia marcescens NOT DETECTED NOT DETECTED Final   Haemophilus influenzae NOT DETECTED NOT DETECTED Final   Neisseria meningitidis NOT DETECTED NOT DETECTED Final   Pseudomonas aeruginosa NOT DETECTED NOT DETECTED Final   Stenotrophomonas maltophilia NOT DETECTED NOT DETECTED Final   Candida albicans NOT DETECTED NOT DETECTED Final   Candida auris NOT DETECTED NOT DETECTED Final   Candida glabrata NOT DETECTED NOT DETECTED Final   Candida krusei NOT DETECTED NOT DETECTED Final   Candida parapsilosis NOT DETECTED NOT DETECTED Final   Candida tropicalis NOT DETECTED NOT DETECTED Final   Cryptococcus neoformans/gattii NOT DETECTED NOT DETECTED Final   Meth resistant mecA/C and MREJ DETECTED (A) NOT DETECTED Final    Comment: CRITICAL RESULT CALLED TO, READ BACK BY AND VERIFIED WITH: PHARMD E JACKSON 06/14/2023 @ 0551 BY AB Performed at [facility], [address] [place], [place] [address]   Blood culture (routine x 2)     Status: Abnormal   Collection Time: 06/13/23  1:55 PM   Specimen: BLOOD RIGHT ARM  Result Value Ref Range Status   Specimen Description   Final    BLOOD RIGHT ARM Performed at [facility], [address], [place], [place] [address]    Special Requests   Final    BOTTLES DRAWN AEROBIC AND ANAEROBIC Blood Culture results may not be optimal due to an inadequate volume of blood received in culture bottles Performed at [facility], [address],  [place], [place] [address]    Culture  Setup Time   Final    GRAM POSITIVE COCCI IN CLUSTERS IN BOTH AEROBIC AND ANAEROBIC BOTTLES CRITICAL VALUE NOTED.  VALUE IS CONSISTENT WITH PREVIOUSLY REPORTED AND CALLED VALUE.    Culture (A)  Final    STAPHYLOCOCCUS AUREUS SUSCEPTIBILITIES PERFORMED ON PREVIOUS CULTURE WITHIN THE LAST 5 DAYS. Performed at [facility], [address] [place], [place] [address]    Report Status 06/17/2023 FINAL  Final  Resp panel by RT-PCR (RSV, Flu A&B, Covid) Anterior Nasal Swab     Status: None   Collection Time: 06/13/23  1:56 PM   Specimen: Anterior Nasal Swab  Result Value Ref Range Status   SARS Coronavirus 2 by RT PCR NEGATIVE NEGATIVE Final    Comment: (NOTE) SARS-CoV-2 target nucleic acids are NOT DETECTED.  The SARS-CoV-2 RNA is generally detectable in upper respiratory specimens  during the acute phase of infection. The lowest concentration of SARS-CoV-2 viral copies this assay can detect is 138 copies/mL. A negative result does not preclude SARS-Cov-2 infection and should not be used as the sole basis for treatment or other patient management decisions. A negative result may occur with  improper specimen collection/handling, submission of specimen other than nasopharyngeal swab, presence of viral mutation(s) within the areas targeted by this assay, and inadequate number of viral copies(<138 copies/mL). A negative result must be combined with clinical observations, patient history, and epidemiological information. The expected result is Negative.  Fact Sheet for Patients:  BloggerCourse.com  Fact Sheet for Healthcare Providers:  SeriousBroker.it  This test is no t yet approved or cleared by the [place] FDA and  has been authorized for detection and/or diagnosis of SARS-CoV-2 by FDA under an Emergency Use Authorization (EUA). This EUA will remain  in effect (meaning this test can be  used) for the duration of the COVID-19 declaration under Section 564(b)(1) of the Act, 21 U.S.C.section 360bbb-3(b)(1), unless the authorization is terminated  or revoked sooner.       Influenza A by PCR NEGATIVE NEGATIVE Final   Influenza B by PCR NEGATIVE NEGATIVE Final    Comment: (NOTE) The Xpert Xpress SARS-CoV-2/FLU/RSV plus assay is intended as an aid in the diagnosis of influenza from Nasopharyngeal swab specimens and should not be used as a sole basis for treatment. Nasal washings and aspirates are unacceptable for Xpert Xpress SARS-CoV-2/FLU/RSV testing.  Fact Sheet for Patients: BloggerCourse.com  Fact Sheet for Healthcare Providers: SeriousBroker.it  This test is not yet approved or cleared by the [place] FDA and has been authorized for detection and/or diagnosis of SARS-CoV-2 by FDA under an Emergency Use Authorization (EUA). This EUA will remain in effect (meaning this test can be used) for the duration of the COVID-19 declaration under Section 564(b)(1) of the Act, 21 U.S.C. section 360bbb-3(b)(1), unless the authorization is terminated or revoked.     Resp Syncytial Virus by PCR NEGATIVE NEGATIVE Final    Comment: (NOTE) Fact Sheet for Patients: BloggerCourse.com  Fact Sheet for Healthcare Providers: SeriousBroker.it  This test is not yet approved or cleared by the [place] FDA and has been authorized for detection and/or diagnosis of SARS-CoV-2 by FDA under an Emergency Use Authorization (EUA). This EUA will remain in effect (meaning this test can be used) for the duration of the COVID-19 declaration under Section 564(b)(1) of the Act, 21 U.S.C. section 360bbb-3(b)(1), unless the authorization is terminated or revoked.  Performed at [facility], [address], [place], [place] [address]   Urine Culture     Status: None    Collection Time: 06/13/23  2:27 PM   Specimen: Urine, Clean Catch  Result Value Ref Range Status   Specimen Description   Final    URINE, CLEAN CATCH Performed at [facility], [address], [place], [place] [address]    Special Requests   Final    NONE Performed at [facility], [address], [place], [place] [address]    Culture   Final    NO GROWTH Performed at [facility], [address] [place], [place] [address]    Report Status 06/14/2023 FINAL  Final  Culture, blood (Routine X 2) w Reflex to ID Panel     Status: None   Collection Time: 06/15/23 10:27 PM   Specimen: BLOOD RIGHT HAND  Result Value Ref Range Status  Specimen Description   Final    BLOOD RIGHT HAND Performed at [facility], [address], [place], [place] [address]    Special Requests   Final    BOTTLES DRAWN AEROBIC ONLY Blood Culture results may not be optimal due to an inadequate volume of blood received in culture bottles Performed at [facility], [address] [place], [place] [address]    Culture   Final    NO GROWTH 5 DAYS Performed at [facility], [address], [place], [place] [address]    Report Status 06/21/2023 FINAL  Final  Culture, blood (Routine X 2) w Reflex to ID Panel     Status: None   Collection Time: 06/15/23 10:27 PM   Specimen: BLOOD RIGHT HAND  Result Value Ref Range Status   Specimen Description   Final    BLOOD RIGHT HAND Performed at [facility], [address] [place], [place] [address]    Special Requests   Final    BOTTLES DRAWN AEROBIC ONLY Blood Culture results may not be optimal due to an inadequate volume of blood received in culture bottles Performed at [facility], [address] [place], [place] [address]    Culture   Final    NO GROWTH 5 DAYS Performed at [facility], [address], [place], [place] [address]    Report Status  06/21/2023 FINAL  Final  Culture, Respiratory w Gram Stain     Status: None   Collection Time: 06/18/23  8:19 AM   Specimen: Tracheal Aspirate; Respiratory  Result Value Ref Range Status   Specimen Description   Final    TRACHEAL ASPIRATE Performed at [facility], [address], [place], [place] [address]    Special Requests   Final    NONE Performed at [facility], [address], [place], [place] [address]    Gram Stain   Final    FEW WBC PRESENT, PREDOMINANTLY PMN RARE YEAST WITH PSEUDOHYPHAE Performed at [facility], [address], [place], [place] [address]    Culture FEW METHICILLIN RESISTANT STAPHYLOCOCCUS AUREUS  Final   Report Status 06/22/2023 FINAL  Final   Organism ID, Bacteria METHICILLIN RESISTANT STAPHYLOCOCCUS AUREUS  Final      Susceptibility   Methicillin resistant staphylococcus aureus - MIC*    CIPROFLOXACIN >=8 RESISTANT Resistant     ERYTHROMYCIN >=8 RESISTANT Resistant     GENTAMICIN <=0.5 SENSITIVE Sensitive     OXACILLIN >=4 RESISTANT Resistant     TETRACYCLINE <=1 SENSITIVE Sensitive     VANCOMYCIN 1 SENSITIVE Sensitive     TRIMETH/SULFA 80 RESISTANT Resistant     CLINDAMYCIN <=0.25 SENSITIVE Sensitive     RIFAMPIN <=0.5 SENSITIVE Sensitive     Inducible Clindamycin NEGATIVE Sensitive     LINEZOLID 2 SENSITIVE Sensitive     * FEW METHICILLIN RESISTANT STAPHYLOCOCCUS AUREUS  Culture, Respiratory w Gram Stain     Status: None (Preliminary result)   Collection Time: 06/22/23  8:31 AM   Specimen: Tracheal Aspirate; Respiratory  Result Value Ref Range Status   Specimen Description   Final    TRACHEAL ASPIRATE Performed at [facility], [address] [place], [place] [address]    Special Requests   Final    NONE Performed at [facility], [address] [place], [place] [address]    Gram Stain   Final  RARE WBC PRESENT, PREDOMINANTLY PMN FEW GRAM POSITIVE  COCCI FEW GRAM NEGATIVE RODS    Culture   Final    ABUNDANT PSEUDOMONAS AERUGINOSA ABUNDANT STAPHYLOCOCCUS AUREUS SUSCEPTIBILITIES TO FOLLOW Performed at [facility], [address] [place], [place] [address]    Report Status PENDING  Incomplete    Impression/Plan:  1. MRSA bacteremia - on vancomycin for the cavitary pneumonia and MRI with lumbar edema and enhancement at the L5 vertebral body concerning for osteomyelitis.  Will need a prolonged course of treatment.    2.  Fever - still with persistent fever despite antibiotics and treatment of the pneumothorax.  Felt to have some worsening of the CXR and have added meropenem.    3.  Therapeutic drug monitoring - vancomycin level 18 and creat wnl.   Will continue to monitor   Evaluation of this patient requires complex antimicrobial therapy evaluation and counseling + isolation needs for disease transmission risk assessment and mitigation.

## 2023-06-23 NOTE — Progress Notes (Incomplete)
 Attending note: I have seen and examined the patient. History, labs and imaging reviewed  48 Y/O with MRSA PNA, septic embolic  Blood pressure (!) 109/52, pulse 69, temperature 98.6 F (37 C), resp. rate 16, height (P) 5\' 11"  (1.803 m), weight 67 kg, SpO2 96%. Gen:      No acute distress HEENT:  EOMI, sclera anicteric Neck:     No masses; no thyromegaly Lungs:    Clear to auscultation bilaterally; normal respiratory effort*** CV:         Regular rate and rhythm; no murmurs Abd:      + bowel sounds; soft, non-tender; no palpable masses, no distension Ext:    No edema; adequate peripheral perfusion Skin:      Warm and dry; no rash Neuro: alert and oriented x 3 Psych: normal mood and affect   Labs/Imaging personally reviewed, significant for   Assessment/plan: MRSA PNA, bacteremia New culture findings of pseudomonas  Continue vanco, add meropenem Lt chest tube placed to water seal  Acute resp failure Pneumothorax CT to water seal. Follow CXR Abx as above Unable to wean due to secretions  Iliac DVT On heparin drip for anticoagulation   The patient is critically ill with multiple organ systems failure and requires high complexity decision making for assessment and support, frequent evaluation and titration of therapies, application of advanced monitoring technologies and extensive interpretation of multiple databases.  Critical care time - 35 mins. This represents my time independent of the NPs time taking care of the pt.  Chilton Greathouse MD [facility] 06/23/2023, 12:03 PM

## 2023-06-23 NOTE — Plan of Care (Signed)
  Problem: Education: Goal: Knowledge of General Education information will improve Description: Including pain rating scale, medication(s)/side effects and non-pharmacologic comfort measures Outcome: Progressing   Problem: Coping: Goal: Level of anxiety will decrease Outcome: Progressing   Problem: Elimination: Goal: Will not experience complications related to bowel motility Outcome: Progressing   

## 2023-06-24 ENCOUNTER — Inpatient Hospital Stay (HOSPITAL_COMMUNITY): Payer: MEDICAID

## 2023-06-24 LAB — CBC
HCT: 21.4 % — ABNORMAL LOW (ref 39.0–52.0)
Hemoglobin: 6.6 g/dL — CL (ref 13.0–17.0)
MCH: 27.3 pg (ref 26.0–34.0)
MCHC: 30.8 g/dL (ref 30.0–36.0)
MCV: 88.4 fL (ref 80.0–100.0)
Platelets: 285 10*3/uL (ref 150–400)
RBC: 2.42 MIL/uL — ABNORMAL LOW (ref 4.22–5.81)
RDW: 18.2 % — ABNORMAL HIGH (ref 11.5–15.5)
WBC: 10.1 10*3/uL (ref 4.0–10.5)
nRBC: 0 % (ref 0.0–0.2)

## 2023-06-24 LAB — COMPREHENSIVE METABOLIC PANEL WITH GFR
ALT: 28 U/L (ref 0–44)
AST: 25 U/L (ref 15–41)
Albumin: <1.5 g/dL — ABNORMAL LOW (ref 3.5–5.0)
Alkaline Phosphatase: 57 U/L (ref 38–126)
Anion gap: 6 (ref 5–15)
BUN: 19 mg/dL (ref 6–20)
CO2: 24 mmol/L (ref 22–32)
Calcium: 6.8 mg/dL — ABNORMAL LOW (ref 8.9–10.3)
Chloride: 112 mmol/L — ABNORMAL HIGH (ref 98–111)
Creatinine, Ser: 0.96 mg/dL (ref 0.61–1.24)
GFR, Estimated: >60 mL/min (ref >60)
Glucose, Bld: 156 mg/dL — ABNORMAL HIGH (ref 70–99)
Potassium: 3.2 mmol/L — ABNORMAL LOW (ref 3.5–5.1)
Sodium: 142 mmol/L (ref 135–145)
Total Bilirubin: 0.6 mg/dL (ref 0.0–1.2)
Total Protein: 5.4 g/dL — ABNORMAL LOW (ref 6.5–8.1)

## 2023-06-24 LAB — BASIC METABOLIC PANEL WITH GFR
Anion gap: 7 (ref 5–15)
BUN: 21 mg/dL — ABNORMAL HIGH (ref 6–20)
CO2: 27 mmol/L (ref 22–32)
Calcium: 7.5 mg/dL — ABNORMAL LOW (ref 8.9–10.3)
Chloride: 109 mmol/L (ref 98–111)
Creatinine, Ser: 1.1 mg/dL (ref 0.61–1.24)
GFR, Estimated: >60 mL/min (ref >60)
Glucose, Bld: 167 mg/dL — ABNORMAL HIGH (ref 70–99)
Potassium: 3.9 mmol/L (ref 3.5–5.1)
Sodium: 143 mmol/L (ref 135–145)

## 2023-06-24 LAB — CULTURE, RESPIRATORY W GRAM STAIN

## 2023-06-24 LAB — GLUCOSE, CAPILLARY
Glucose-Capillary: 102 mg/dL — ABNORMAL HIGH (ref 70–99)
Glucose-Capillary: 124 mg/dL — ABNORMAL HIGH (ref 70–99)
Glucose-Capillary: 127 mg/dL — ABNORMAL HIGH (ref 70–99)
Glucose-Capillary: 136 mg/dL — ABNORMAL HIGH (ref 70–99)
Glucose-Capillary: 142 mg/dL — ABNORMAL HIGH (ref 70–99)
Glucose-Capillary: 149 mg/dL — ABNORMAL HIGH (ref 70–99)

## 2023-06-24 LAB — ABO/RH: ABO/RH(D): O POS

## 2023-06-24 LAB — PREPARE RBC (CROSSMATCH)

## 2023-06-24 LAB — HEMOGLOBIN AND HEMATOCRIT, BLOOD
HCT: 26.7 % — ABNORMAL LOW (ref 39.0–52.0)
Hemoglobin: 8.4 g/dL — ABNORMAL LOW (ref 13.0–17.0)

## 2023-06-24 LAB — MAGNESIUM: Magnesium: 1.7 mg/dL (ref 1.7–2.4)

## 2023-06-24 MED ORDER — LACTATED RINGERS IV BOLUS
500.0000 mL | Freq: Once | INTRAVENOUS | Status: AC
  Administered 2023-06-24: 500 mL via INTRAVENOUS

## 2023-06-24 MED ORDER — FUROSEMIDE 10 MG/ML IJ SOLN
40.0000 mg | Freq: Once | INTRAMUSCULAR | Status: AC
  Administered 2023-06-24: 40 mg via INTRAVENOUS
  Filled 2023-06-24: qty 4

## 2023-06-24 MED ORDER — SODIUM CHLORIDE 0.9% IV SOLUTION: Freq: Once | INTRAVENOUS | Status: AC

## 2023-06-24 MED ORDER — POTASSIUM CHLORIDE 20 MEQ PO PACK
40.0000 meq | PACK | ORAL | Status: AC
  Administered 2023-06-24 (×2): 40 meq
  Filled 2023-06-24 (×2): qty 2

## 2023-06-24 NOTE — Progress Notes (Signed)
 NAME:  Brendan Preston, MRN:  [id number], DOB:  04-Aug-1975, LOS: 11 ADMISSION DATE:  06/13/2023, CONSULTATION DATE:  06/13/23 REFERRING MD:  Dr. Doran Durand, CHIEF COMPLAINT:  AMS   History of Present Illness:  Pt encephalopathic, therefore HPI obtained from chart review.  See additional chart w/MRN [id number]  48 yoM with prior hx of known polysubstance (heroin/ fentanyl) abuse in which EMS was called out for AMS, last seen awake 3 days ago covered in urine and feces.   In ER, temp 104.3 rectal, tachycardic, normo to slightly hypertensive, sats 100% on NRB> but maintaining sats on RA with GCS ~10 but protecting airway.  Labs significant for lactic 8.4, WBC 15, plts 96, VBG 7.29/ 44,  CXR neg, RVP neg, EKG without ST changes, qtc borderline, CTH official read pending, trop hs 694, INR 1.3.  Foley placed- urine cloudy, glucose 150, Hgb 150 (-RBC), neg leuks/nitrates, WBC 6-10, UDS positive for THC and opiates.  CMET and other labs had to be redrawn and still awaiting due to hemolysis.  Cultures sent, empiric vanc/ cefepime given, 2L LR.  PCCM called for admit.  Pertinent  Medical History  Tobacco abuse, polysubstance abuse/ IVDA, ETOH unclear  Significant Hospital Events: Including procedures, antibiotic start and stop dates in addition to other pertinent events   3/7 admit, encephalopathic, altered, drug rash developed after cefepime 3/8 TTE negative for endocarditis 3/10 he had a TEE scheduled but sounds like he has declined this . Larey Seat out of bed late day shift and broke his nose. Bcx still pos. 3/11 Tubed in afternoon. CRP high.  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 Still having marked trach secretions. Growing PA so added meropenem (given his rash w/ cefepime). Changed fent to dilaudid at 2mg /hr. CT clamped.  3/18 hgb down to 6.6 got 1 unit PRBC. No obvious bleeding source. Was >13 liters positive so also starting diuretics. Dec'd dilaudid dose to 1.5mg /hr. no PTX so CT removed.   Interim History / Subjective:  Sedated   Objective   Blood pressure (!) 107/41, pulse (!) 103, temperature 98.7 F (37.1 C), resp. rate 17, height (P) 5\' 11"  (1.803 m), weight 67 kg, SpO2 100%.    Vent Mode: PRVC FiO2 (%):  [40 %] 40 % Set Rate:  [16 bmp] 16 bmp Vt Set:  [550 mL] 550 mL PEEP:  [5 cmH20] 5 cmH20 Plateau Pressure:  [20 cmH20-30 cmH20] 30 cmH20   Intake/Output Summary (Last 24 hours) at 06/24/2023 0821 Last data filed at 06/24/2023 5366 Gross per 24 hour  Intake 2947.43 ml  Output 2200 ml  Net 747.43 ml   Filed Weights   06/19/23 0400 06/20/23 0500 06/21/23 0711  Weight: 66.6 kg 63.9 kg 67 kg    Examination: General this is a chronically ill appearing 48 yr old male.  HENT NCAT orally intubated Pulm diffuse coarse rhonchi w/ wheeze. No accessory use on full support. Still w/ thick brown sec. But volume decreased. PCXR ett good position. CT good position., worsening L> R airspace disease but no PTX. CT clamped all night Card RRR Abd soft Ext warm dry  Neuro sedated today  Resolved Hospital Problem list   Cefepime drug rash Encephalopathy  AKI  Shock  Pneumothorax. Resolved 3/18 chest tube removed  Assessment & Plan:   Sepsis with MRSA bacteremia, Cavitary pneumonia MRSA and then pseudomonas, and septic emboli and osteo to spine TEE neg for endocarditis. Repeat blood cx from 3/9 no growth. Sputum cx 3/16 with few gpc and few gnr. Currently afebrile. WBC 13.1. ID following Plan Continue Vanc day 12/x Day 2/x meropenem f/u sensitives  Additional  recs as per ID   Acute hypoxic respiratory failure due to ARDS with necrotizing MRSA PNA, septic emboli and pseudomonas PNA Left Pneumothorax CT clamped since yesterday no PTX on film Plan Cont full vent support w/ daily assessment for readiness to wean and SBT Treating PNA VAP bundle  PAD protocol RASS goal -1 (he is over sedated currently) Pulm hygiene (added back HT saline) May need trach  Remove Chest tube  Acute HFrEF (45-50%) Possible PFO  TEE w possible small PFO grade 1 L to R shunt  Plan Consider repeat TTE after acute illness Cont tele  Daily assessment for diuretics   Fluid and electrolyte imbalance: hypokalemia Plan Replaced and recheck this afternoon w/ planned diuretics  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 Neg LE duplex Plan  Changed to LMWH given heparin levels not able to be measured on 3/17  Polysubstance abuse Opiate dependence Positive for opiates and THC on admission Plan Continue scheduled oxycodone  Continue precedex and rotated to fixed dose dilaudid, I will dec this dosing.  May benefit from ketamine at some point (reaching out to palliative today re: dosing protocol for narcotic sensitivity) Rass goal 0/-1 Delirium precautions   Fall, comminuted nasal bone fx Plan ENT will see him as outpatient when stable  Protein calorie malnutrition, mild Plan Cont tube feeds  Anemia of critical illness Hgb down to 6.6 from 7.1 no obvious bleeding. He is on therapeutic LMWH He is + 13 liters so ? Some component of dilution  Plan 1 unit transfused  Cont to trend cbc Trigger for transfusion < 7    Best Practice (right click and "Reselect all SmartList Selections" daily)   Diet/type: tubefeeds  DVT prophylaxis prophylactic heparin  Pressure ulcer(s): N/A GI prophylaxis: PPI Lines: N/A Foley:  Yes, and it is still needed Code Status:  full code Last date of multidisciplinary goals of care discussion-parents updated at bedside  My cct 33 min

## 2023-06-24 NOTE — Progress Notes (Signed)
 eLink Physician-Brief Progress Note Patient Name: Brendan Preston DOB: 11/25/1975 MRN: [id number]   Date of Service  06/24/2023  HPI/Events of Note  64 yoM with prior hx of known polysubstance (heroin/ fentanyl) abuse in which EMS was called out for AMS, now in ARDS and septic shock.   Confirm OGT  eICU Interventions  Tube in stomach, okay to use      Keishawna Carranza 06/24/2023, 9:35 PM

## 2023-06-24 NOTE — Plan of Care (Signed)
  Problem: Nutrition: Goal: Adequate nutrition will be maintained Outcome: Progressing   Problem: Elimination: Goal: Will not experience complications related to bowel motility Outcome: Progressing   Problem: Elimination: Goal: Will not experience complications related to urinary retention Outcome: Progressing   Problem: Safety: Goal: Ability to remain free from injury will improve Outcome: Progressing

## 2023-06-24 NOTE — Progress Notes (Signed)
 [facility]   Reason for visit: Follow-up on bacteremia  Interval History: WBC down to 10.1, Tmax of 100.5.  Mother and father at bedside.  Physical Exam: Constitutional:  Vitals:   06/24/23 1116 06/24/23 1208  BP:    Pulse:    Resp:    Temp: (!) 100.6 F (38.1 C)   SpO2:  100%  He is alert, no acute distress Eyes: anicteric HENT: +ET Respiratory: Respiratory effort stable on the vent  Review of Systems: Integument/breast: Negative for rash  Lab Results  Component Value Date   WBC 10.1 06/24/2023   HGB 8.4 (L) 06/24/2023   HCT 26.7 (L) 06/24/2023   MCV 88.4 06/24/2023   PLT 285 06/24/2023    Lab Results  Component Value Date   CREATININE 0.96 06/24/2023   BUN 19 06/24/2023   NA 142 06/24/2023   K 3.2 (L) 06/24/2023   CL 112 (H) 06/24/2023   CO2 24 06/24/2023    Lab Results  Component Value Date   ALT 28 06/24/2023   AST 25 06/24/2023   ALKPHOS 57 06/24/2023     Microbiology: Recent Results (from the past 240 hours)  Culture, blood (Routine X 2) w Reflex to ID Panel     Status: None   Collection Time: 06/15/23 10:27 PM   Specimen: BLOOD RIGHT HAND  Result Value Ref Range Status   Specimen Description   Final    BLOOD RIGHT HAND Performed at [facility], [address] [place], [place] [address]    Special Requests   Final    BOTTLES DRAWN AEROBIC ONLY Blood Culture results may not be optimal due to an inadequate volume of blood received in culture bottles Performed at [facility], [address] [place], [place] [address]    Culture   Final    NO GROWTH 5 DAYS Performed at [facility], [address], [place], [place] [address]    Report Status 06/21/2023 FINAL  Final  Culture, blood (Routine X 2) w Reflex to ID Panel     Status: None   Collection Time: 06/15/23 10:27 PM   Specimen: BLOOD RIGHT HAND  Result Value Ref Range Status   Specimen Description   Final    BLOOD RIGHT  HAND Performed at [facility], [address] [place], [place] [address]    Special Requests   Final    BOTTLES DRAWN AEROBIC ONLY Blood Culture results may not be optimal due to an inadequate volume of blood received in culture bottles Performed at [facility], [address], [place], [place] [address]    Culture   Final    NO GROWTH 5 DAYS Performed at [facility], [address], [place], [place] [address]    Report Status 06/21/2023 FINAL  Final  Culture, Respiratory w Gram Stain     Status: None   Collection Time: 06/18/23  8:19 AM   Specimen: Tracheal Aspirate; Respiratory  Result Value Ref Range Status   Specimen Description   Final    TRACHEAL ASPIRATE Performed at [facility], [address] [place], [place] [address]    Special Requests   Final    NONE Performed at [facility], [address], [place], [place] [address]    Gram Stain   Final    FEW WBC PRESENT, PREDOMINANTLY PMN RARE YEAST WITH PSEUDOHYPHAE Performed at [facility], [address] [place], [place] [address]    Culture FEW METHICILLIN RESISTANT STAPHYLOCOCCUS AUREUS  Final   Report Status 06/22/2023 FINAL  Final   Organism ID, Bacteria METHICILLIN RESISTANT STAPHYLOCOCCUS AUREUS  Final      Susceptibility   Methicillin resistant staphylococcus aureus - MIC*    CIPROFLOXACIN >=8 RESISTANT Resistant     ERYTHROMYCIN >=8 RESISTANT Resistant     GENTAMICIN <=0.5 SENSITIVE Sensitive     OXACILLIN >=4 RESISTANT Resistant     TETRACYCLINE <=1 SENSITIVE Sensitive     VANCOMYCIN 1 SENSITIVE Sensitive     TRIMETH/SULFA 80 RESISTANT Resistant     CLINDAMYCIN <=0.25 SENSITIVE Sensitive     RIFAMPIN <=0.5 SENSITIVE Sensitive     Inducible Clindamycin NEGATIVE Sensitive     LINEZOLID 2 SENSITIVE Sensitive     * FEW METHICILLIN RESISTANT STAPHYLOCOCCUS AUREUS  MIC (1 Drug)-blood culture; 06/13/2023; BLOOD; MRSA; Daptomycin;  Patient immune status: Normal     Status: Abnormal   Collection Time: 06/20/23 12:56 PM   Specimen: BLOOD  Result Value Ref Range Status   Min Inhibitory Conc (1 Drug) Preliminary report (A)  Final    Comment: (NOTE) Performed At: [facility] [address] [place], [place] [id number] Jolene Schimke MD IO:[id number]    Source BLOOD  Final    Comment: Performed at [facility], [address], [place], [place] [address]  MIC Result     Status: Abnormal   Collection Time: 06/20/23 12:56 PM  Result Value Ref Range Status   Result 1 (MIC) Comment (A)  Final    Comment: (NOTE) Methicillin - resistant Staphylococcus aureus Identification performed by account, not confirmed by this laboratory. DAPTOMYCIN Performed At: [facility] [address] [place], [place] [id number] Jolene Schimke MD FY:[id number]   Culture, Respiratory w Gram Stain     Status: None   Collection Time: 06/22/23  8:31 AM   Specimen: Tracheal Aspirate; Respiratory  Result Value Ref Range Status   Specimen Description   Final    TRACHEAL ASPIRATE Performed at [facility], [address], [place], [place] [address]    Special Requests   Final    NONE Performed at [facility], [address], [place], [place] [address]    Gram Stain   Final    RARE WBC PRESENT, PREDOMINANTLY PMN FEW GRAM POSITIVE COCCI FEW GRAM NEGATIVE RODS Performed at [facility], [address] [place], [place] [address]    Culture   Final    ABUNDANT PSEUDOMONAS AERUGINOSA ABUNDANT METHICILLIN RESISTANT STAPHYLOCOCCUS AUREUS    Report Status 06/24/2023 FINAL  Final   Organism ID, Bacteria PSEUDOMONAS AERUGINOSA  Final   Organism ID, Bacteria METHICILLIN RESISTANT STAPHYLOCOCCUS AUREUS  Final      Susceptibility   Methicillin resistant staphylococcus aureus - MIC*    CIPROFLOXACIN >=8 RESISTANT Resistant     ERYTHROMYCIN >=8 RESISTANT Resistant     GENTAMICIN  <=0.5 SENSITIVE Sensitive     OXACILLIN >=4 RESISTANT Resistant     TETRACYCLINE <=1 SENSITIVE Sensitive     VANCOMYCIN 1 SENSITIVE Sensitive     TRIMETH/SULFA 160 RESISTANT Resistant     CLINDAMYCIN <=0.25 SENSITIVE Sensitive     RIFAMPIN <=0.5 SENSITIVE Sensitive     Inducible Clindamycin NEGATIVE Sensitive     LINEZOLID 2 SENSITIVE Sensitive     * ABUNDANT METHICILLIN RESISTANT STAPHYLOCOCCUS AUREUS   Pseudomonas aeruginosa - MIC*    CEFTAZIDIME 4 SENSITIVE Sensitive  CIPROFLOXACIN <=0.25 SENSITIVE Sensitive     GENTAMICIN 4 SENSITIVE Sensitive     IMIPENEM 2 SENSITIVE Sensitive     PIP/TAZO 16 SENSITIVE Sensitive ug/mL    CEFEPIME 2 SENSITIVE Sensitive     * ABUNDANT PSEUDOMONAS AERUGINOSA    Impression/Plan:  1.  MRSA bacteremia.  He continues to remain stable and is on vancomycin for this.  MRI did note some concerns of the lumbar region for infection and will need prolonged treatment for this.  Will continue to monitor.  2.  Fever.  He continues to have some fever and Pseudomonas noted on respiratory culture along with the MRSA.  On meropenem for that and sensitivities noted so we will change that to ciprofloxacin.  3.  Therapeutic drug monitoring.  The creatinine remains within normal limits.  Continue to monitor.   Evaluation of this patient requires complex antimicrobial therapy evaluation and counseling + isolation needs for disease transmission risk assessment and mitigation.

## 2023-06-24 NOTE — Progress Notes (Signed)
 [facility] ADULT ICU REPLACEMENT PROTOCOL   The patient does apply for the [facility] Adult ICU Electrolyte Replacment Protocol based on the criteria listed below:   1.Exclusion criteria: TCTS, ECMO, Dialysis, and Myasthenia Gravis patients 2. Is GFR >/= 30 ml/min? Yes.    Patient's GFR today is >60 3. Is SCr </= 2? Yes.   Patient's SCr is 0.96 mg/dL 4. Did SCr increase >/= 0.5 in 24 hours? No. 5.Pt's weight >40kg  Yes.   6. Abnormal electrolyte(s): K+3.2  7. Electrolytes replaced per protocol 8.  Call MD STAT for K+ </= 2.5, Phos </= 1, or Mag </= 1 Physician:  Larinda Buttery [facility] 06/24/2023 4:15 AM

## 2023-06-24 NOTE — Progress Notes (Addendum)
 eLink Physician-Brief Progress Note Patient Name: Brendan Preston DOB: 07-30-1975 MRN: [id number]   Date of Service  06/24/2023  HPI/Events of Note  30 yoM with prior hx of known polysubstance (heroin/ fentanyl) abuse in which EMS was called out for AMS, now in ARDS and septic shock.   BP now trending down after nighttime meds.  Currently on a Precedex infusion, MAP 63  eICU Interventions  Down titrating Precedex.  Attempt 500 cc LR bolus   0406 -hemoglobin 6.6, slow downtrend, now 6.6.  Type and screen and 1 unit PRBC ordered  Intervention Category Intermediate Interventions: Hypotension - evaluation and management  Asar Evilsizer 06/24/2023, 12:15 AM

## 2023-06-25 ENCOUNTER — Inpatient Hospital Stay (HOSPITAL_COMMUNITY): Payer: MEDICAID

## 2023-06-25 LAB — CBC
HCT: 24.1 % — ABNORMAL LOW (ref 39.0–52.0)
Hemoglobin: 7.5 g/dL — ABNORMAL LOW (ref 13.0–17.0)
MCH: 27.7 pg (ref 26.0–34.0)
MCHC: 31.1 g/dL (ref 30.0–36.0)
MCV: 88.9 fL (ref 80.0–100.0)
Platelets: 298 10*3/uL (ref 150–400)
RBC: 2.71 MIL/uL — ABNORMAL LOW (ref 4.22–5.81)
RDW: 17.9 % — ABNORMAL HIGH (ref 11.5–15.5)
WBC: 9 10*3/uL (ref 4.0–10.5)
nRBC: 0 % (ref 0.0–0.2)

## 2023-06-25 LAB — COMPREHENSIVE METABOLIC PANEL WITH GFR
ALT: 27 U/L (ref 0–44)
AST: 26 U/L (ref 15–41)
Albumin: <1.5 g/dL — ABNORMAL LOW (ref 3.5–5.0)
Alkaline Phosphatase: 63 U/L (ref 38–126)
Anion gap: 7 (ref 5–15)
BUN: 19 mg/dL (ref 6–20)
CO2: 28 mmol/L (ref 22–32)
Calcium: 7.4 mg/dL — ABNORMAL LOW (ref 8.9–10.3)
Chloride: 108 mmol/L (ref 98–111)
Creatinine, Ser: 1 mg/dL (ref 0.61–1.24)
GFR, Estimated: >60 mL/min (ref >60)
Glucose, Bld: 136 mg/dL — ABNORMAL HIGH (ref 70–99)
Potassium: 3.4 mmol/L — ABNORMAL LOW (ref 3.5–5.1)
Sodium: 143 mmol/L (ref 135–145)
Total Bilirubin: 0.5 mg/dL (ref 0.0–1.2)
Total Protein: 6 g/dL — ABNORMAL LOW (ref 6.5–8.1)

## 2023-06-25 LAB — GLUCOSE, CAPILLARY
Glucose-Capillary: 101 mg/dL — ABNORMAL HIGH (ref 70–99)
Glucose-Capillary: 112 mg/dL — ABNORMAL HIGH (ref 70–99)
Glucose-Capillary: 119 mg/dL — ABNORMAL HIGH (ref 70–99)
Glucose-Capillary: 121 mg/dL — ABNORMAL HIGH (ref 70–99)
Glucose-Capillary: 143 mg/dL — ABNORMAL HIGH (ref 70–99)
Glucose-Capillary: 147 mg/dL — ABNORMAL HIGH (ref 70–99)

## 2023-06-25 LAB — MIC RESULT

## 2023-06-25 LAB — MINIMUM INHIBITORY CONC. (1 DRUG)

## 2023-06-25 MED ORDER — POTASSIUM CHLORIDE 20 MEQ PO PACK
80.0000 meq | PACK | Freq: Once | ORAL | Status: AC
  Administered 2023-06-25: 80 meq
  Filled 2023-06-25: qty 4

## 2023-06-25 MED ORDER — MIDAZOLAM HCL 2 MG/2ML IJ SOLN
INTRAMUSCULAR | Status: AC
  Filled 2023-06-25: qty 2

## 2023-06-25 MED ORDER — MIDAZOLAM HCL 2 MG/2ML IJ SOLN: 2.0000 mg | Freq: Once | INTRAMUSCULAR | Status: AC

## 2023-06-25 MED ORDER — POTASSIUM CHLORIDE 20 MEQ PO PACK
40.0000 meq | PACK | Freq: Once | ORAL | Status: DC
  Filled 2023-06-25: qty 2

## 2023-06-25 MED ORDER — MIDAZOLAM HCL (PF) 2 MG/2ML IJ SOLN
2.0000 mg | Freq: Once | INTRAMUSCULAR | Status: DC
  Filled 2023-06-25: qty 2

## 2023-06-25 MED ORDER — FUROSEMIDE 10 MG/ML IJ SOLN
40.0000 mg | Freq: Once | INTRAMUSCULAR | Status: AC
  Administered 2023-06-25: 40 mg via INTRAVENOUS
  Filled 2023-06-25: qty 4

## 2023-06-25 MED ORDER — CIPROFLOXACIN IN D5W 400 MG/200ML IV SOLN
400.0000 mg | Freq: Three times a day (TID) | INTRAVENOUS | Status: AC
  Administered 2023-06-25 – 2023-06-29 (×14): 400 mg via INTRAVENOUS
  Filled 2023-06-25 (×14): qty 200

## 2023-06-25 NOTE — Plan of Care (Signed)
   Problem: Nutrition: Goal: Adequate nutrition will be maintained Outcome: Progressing   Problem: Elimination: Goal: Will not experience complications related to bowel motility Outcome: Progressing Goal: Will not experience complications related to urinary retention Outcome: Progressing

## 2023-06-25 NOTE — Progress Notes (Signed)
   06/25/23 1557  Adult Ventilator Settings  Vent Mode (S)  PRVC (Ended vent wean by RN, due to increased fatigue. Pt completed 7 hrs. PSV 10/5 today.)

## 2023-06-25 NOTE — Progress Notes (Signed)
 This RN was contacted by lab for multiple critical results on pt's AM labs. I have redrawn the labs and are awaiting on another set of results. Elink nurse, Kara Mead notified.

## 2023-06-25 NOTE — Progress Notes (Signed)
 [facility]   Reason for visit: Follow up on bacteremia  Interval History: He has no complaints today.  WBC of 9.0.  Tmax of 101.7.   Physical Exam: Constitutional:  Vitals:   06/25/23 0800 06/25/23 0856  BP: (!) 140/71   Pulse: 76   Resp: 16   Temp: 98.1 F (36.7 C)   SpO2: 99% 99%   patient appears in NAD Eyes: anicteric HENT: +ET Respiratory: Respiratory effort on the vent  Review of Systems: Constitutional: negative for chills  Lab Results  Component Value Date   WBC 9.0 06/25/2023   HGB 7.5 (L) 06/25/2023   HCT 24.1 (L) 06/25/2023   MCV 88.9 06/25/2023   PLT 298 06/25/2023    Lab Results  Component Value Date   CREATININE 1.00 06/25/2023   BUN 19 06/25/2023   NA 143 06/25/2023   K 3.4 (L) 06/25/2023   CL 108 06/25/2023   CO2 28 06/25/2023    Lab Results  Component Value Date   ALT 27 06/25/2023   AST 26 06/25/2023   ALKPHOS 63 06/25/2023     Microbiology: Recent Results (from the past 240 hours)  Culture, blood (Routine X 2) w Reflex to ID Panel     Status: None   Collection Time: 06/15/23 10:27 PM   Specimen: BLOOD RIGHT HAND  Result Value Ref Range Status   Specimen Description   Final    BLOOD RIGHT HAND Performed at [facility], [address], [place], [place] [address]    Special Requests   Final    BOTTLES DRAWN AEROBIC ONLY Blood Culture results may not be optimal due to an inadequate volume of blood received in culture bottles Performed at [facility], [address], [place], [place] [address]    Culture   Final    NO GROWTH 5 DAYS Performed at [facility], [address] [place], [place] [address]    Report Status 06/21/2023 FINAL  Final  Culture, blood (Routine X 2) w Reflex to ID Panel     Status: None   Collection Time: 06/15/23 10:27 PM   Specimen: BLOOD RIGHT HAND  Result Value Ref Range Status   Specimen Description   Final    BLOOD RIGHT HAND Performed at  [facility], [address], [place], [place] [address]    Special Requests   Final    BOTTLES DRAWN AEROBIC ONLY Blood Culture results may not be optimal due to an inadequate volume of blood received in culture bottles Performed at [facility], [address] [place], [place] [address]    Culture   Final    NO GROWTH 5 DAYS Performed at [facility], [address], [place], [place] [address]    Report Status 06/21/2023 FINAL  Final  Culture, Respiratory w Gram Stain     Status: None   Collection Time: 06/18/23  8:19 AM   Specimen: Tracheal Aspirate; Respiratory  Result Value Ref Range Status   Specimen Description   Final    TRACHEAL ASPIRATE Performed at [facility], [address] [place], [place] [address]    Special Requests   Final    NONE Performed at [facility], [address] [place], [place] [address]    Gram Stain   Final    FEW WBC PRESENT, PREDOMINANTLY PMN RARE YEAST WITH PSEUDOHYPHAE Performed at [facility],  [address], [place], [place] [address]    Culture FEW METHICILLIN RESISTANT STAPHYLOCOCCUS AUREUS  Final   Report Status 06/22/2023 FINAL  Final   Organism ID, Bacteria METHICILLIN RESISTANT STAPHYLOCOCCUS AUREUS  Final      Susceptibility   Methicillin resistant staphylococcus aureus - MIC*    CIPROFLOXACIN >=8 RESISTANT Resistant     ERYTHROMYCIN >=8 RESISTANT Resistant     GENTAMICIN <=0.5 SENSITIVE Sensitive     OXACILLIN >=4 RESISTANT Resistant     TETRACYCLINE <=1 SENSITIVE Sensitive     VANCOMYCIN 1 SENSITIVE Sensitive     TRIMETH/SULFA 80 RESISTANT Resistant     CLINDAMYCIN <=0.25 SENSITIVE Sensitive     RIFAMPIN <=0.5 SENSITIVE Sensitive     Inducible Clindamycin NEGATIVE Sensitive     LINEZOLID 2 SENSITIVE Sensitive     * FEW METHICILLIN RESISTANT STAPHYLOCOCCUS AUREUS  MIC (1 Drug)-blood culture; 06/13/2023; BLOOD; MRSA; Daptomycin; Patient immune status:  Normal     Status: Abnormal   Collection Time: 06/20/23 12:56 PM   Specimen: BLOOD  Result Value Ref Range Status   Min Inhibitory Conc (1 Drug) Preliminary report (A)  Final    Comment: (NOTE) Performed At: [facility] [address] [place], [place] [id number] Jolene Schimke MD AO:[id number]    Source BLOOD  Final    Comment: Performed at [facility], [address], [place], [place] [address]  MIC Result     Status: Abnormal   Collection Time: 06/20/23 12:56 PM  Result Value Ref Range Status   Result 1 (MIC) Comment (A)  Final    Comment: (NOTE) Methicillin - resistant Staphylococcus aureus Identification performed by account, not confirmed by this laboratory. DAPTOMYCIN Performed At: [facility] [address] [place], [place] [id number] Jolene Schimke MD NU:[id number]   Culture, Respiratory w Gram Stain     Status: None   Collection Time: 06/22/23  8:31 AM   Specimen: Tracheal Aspirate; Respiratory  Result Value Ref Range Status   Specimen Description   Final    TRACHEAL ASPIRATE Performed at [facility], [address], [place], [place] [address]    Special Requests   Final    NONE Performed at [facility], [address], [place], [place] [address]    Gram Stain   Final    RARE WBC PRESENT, PREDOMINANTLY PMN FEW GRAM POSITIVE COCCI FEW GRAM NEGATIVE RODS Performed at [facility], [address] [place], [place] [address]    Culture   Final    ABUNDANT PSEUDOMONAS AERUGINOSA ABUNDANT METHICILLIN RESISTANT STAPHYLOCOCCUS AUREUS    Report Status 06/24/2023 FINAL  Final   Organism ID, Bacteria PSEUDOMONAS AERUGINOSA  Final   Organism ID, Bacteria METHICILLIN RESISTANT STAPHYLOCOCCUS AUREUS  Final      Susceptibility   Methicillin resistant staphylococcus aureus - MIC*    CIPROFLOXACIN >=8 RESISTANT Resistant     ERYTHROMYCIN >=8 RESISTANT Resistant     GENTAMICIN <=0.5 SENSITIVE Sensitive      OXACILLIN >=4 RESISTANT Resistant     TETRACYCLINE <=1 SENSITIVE Sensitive     VANCOMYCIN 1 SENSITIVE Sensitive     TRIMETH/SULFA 160 RESISTANT Resistant     CLINDAMYCIN <=0.25 SENSITIVE Sensitive     RIFAMPIN <=0.5 SENSITIVE Sensitive     Inducible Clindamycin NEGATIVE Sensitive     LINEZOLID 2 SENSITIVE Sensitive     * ABUNDANT METHICILLIN RESISTANT STAPHYLOCOCCUS AUREUS   Pseudomonas aeruginosa - MIC*    CEFTAZIDIME 4 SENSITIVE Sensitive  CIPROFLOXACIN <=0.25 SENSITIVE Sensitive     GENTAMICIN 4 SENSITIVE Sensitive     IMIPENEM 2 SENSITIVE Sensitive     PIP/TAZO 16 SENSITIVE Sensitive ug/mL    CEFEPIME 2 SENSITIVE Sensitive     * ABUNDANT PSEUDOMONAS AERUGINOSA    Impression/Plan:  1.  MRSA bacteremia.  He is on vancomycin for this and repeat cultures did remain negative.  Also with MRSA in his lungs and has cavitary pneumonia noted.  He will need a prolonged course of antibiotics.  TEE negative for vegetation though.  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  2.  Fever.  This has trended down somewhat and is on Cipro for the positive culture with Pseudomonas.  Will continue with this for now and monitor.  3.  Therapeutic drug monitoring.  His creatinine is stable at 1.0 and we will continue to monitor this.   Evaluation of this patient requires complex antimicrobial therapy evaluation and counseling + isolation needs for disease transmission risk assessment and mitigation.

## 2023-06-25 NOTE — Progress Notes (Signed)
 NAME:  Brendan Preston, MRN:  [id number], DOB:  1976-03-18, LOS: 12 ADMISSION DATE:  06/13/2023, CONSULTATION DATE:  06/13/23 REFERRING MD:  Dr. Doran Durand, CHIEF COMPLAINT:  AMS   History of Present Illness:  Pt encephalopathic, therefore HPI obtained from chart review.  See additional chart w/MRN [id number]  63 yoM with prior hx of known polysubstance (heroin/ fentanyl) abuse in which EMS was called out for AMS, last seen awake 3 days ago covered in urine and feces.   In ER, temp 104.3 rectal, tachycardic, normo to slightly hypertensive, sats 100% on NRB> but maintaining sats on RA with GCS ~10 but protecting airway.  Labs significant for lactic 8.4, WBC 15, plts 96, VBG 7.29/ 44,  CXR neg, RVP neg, EKG without ST changes, qtc borderline, CTH official read pending, trop hs 694, INR 1.3.  Foley placed- urine cloudy, glucose 150, Hgb 150 (-RBC), neg leuks/nitrates, WBC 6-10, UDS positive for THC and opiates.  CMET and other labs had to be redrawn and still awaiting due to hemolysis.  Cultures sent, empiric vanc/ cefepime given, 2L LR.  PCCM called for admit.  Pertinent  Medical History  Tobacco abuse, polysubstance abuse/ IVDA, ETOH unclear  Significant Hospital Events: Including procedures, antibiotic start and stop dates in addition to other pertinent events   3/7 admit, encephalopathic, altered, drug rash developed after cefepime 3/8 TTE negative for endocarditis 3/10 he had a TEE scheduled but sounds like he has declined this . Larey Seat out of bed late day shift and broke his nose. Bcx still pos. 3/11 Tubed in afternoon. CRP high.  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 Still having marked trach secretions. Growing PA so added meropenem (given his rash w/ cefepime). Changed fent to dilaudid at 2mg /hr. CT clamped.  3/18 hgb down to 6.6 got 1 unit PRBC. No obvious bleeding source. Was >13 liters positive so also starting diuretics. Dec'd dilaudid dose to 1.5mg /hr. no PTX so CT removed.   Interim History / Subjective:  Comfortable. No acute events.  Objective   Blood pressure (!) 140/71, pulse 76, temperature 98.1 F (36.7 C), temperature source Bladder, resp. rate 16, height (P) 5\' 11"  (1.803 m), weight 70.6 kg, SpO2 99%.    Vent Mode: PSV;CPAP FiO2 (%):  [40 %] 40 % Set Rate:  [16 bmp] 16 bmp Vt Set:  [550 mL-600 mL] 600 mL PEEP:  [5 cmH20] 5 cmH20 Plateau Pressure:  [16 cmH20-19 cmH20] 19 cmH20   Intake/Output Summary (Last 24 hours) at 06/25/2023 0908 Last data filed at 06/25/2023 6644 Gross per 24 hour  Intake 2809.64 ml  Output 4150 ml  Net -1340.36 ml   Filed Weights   06/20/23 0500 06/21/23 0711 06/25/23 0500  Weight: 63.9 kg 67 kg 70.6 kg    Examination: General: Adult male, chronically ill appearing, in NAD. Neuro: Sedated but opens eyes to voice, follows intermittent basic commands. HEENT: [place]/AT. Sclerae anicteric. ETT in place. Cardiovascular: RRR, no M/R/G.  Lungs: Respirations even and unlabored.  Rhonchi bilaterally. Secretions suctioned via ETT. Abdomen: BS x 4, soft, NT/ND.  Musculoskeletal: No gross deformities, no edema.  Skin: Intact, warm, no rashes.  Resolved Hospital Problem list   Cefepime drug rash Encephalopathy  AKI  Shock  Pneumothorax. Resolved 3/18 chest tube removed  Assessment & Plan:   Sepsis with MRSA bacteremia, Cavitary pneumonia MRSA and then pseudomonas complicated by septic emboli and osteo to spine TEE neg for endocarditis. Repeat blood cx from 3/9 no growth. Sputum cx 3/16 with few gpc and few gnr.  Currently afebrile. WBC 13.1. ID following Plan Continue Vanc day 12/x. Will need prolonged therapy Continue Cipro day 1/x (changed from Merrem based on sensitivities) Additional recs as per ID   Acute hypoxic respiratory failure due to ARDS with necrotizing MRSA PNA, septic emboli and pseudomonas PNA Left Pneumothorax- s/p chest tube placement 3/14 with subsequent removal 3/18 Plan Cont full vent support w/ daily assessment for readiness to wean and SBT Treating PNA VAP bundle  PAD protocol RASS goal -1 Pulm hygiene (continue HT saline) Add chest PT May need trach if secretions continue to be a burden/barrier to extubation  Acute HFrEF (45-50%) Possible PFO  TEE w possible small PFO grade 1 L to R shunt  Plan Consider repeat TTE after acute illness Cont tele  Daily assessment for diuretics - 40mg  today  Fluid and electrolyte imbalance: hypokalemia Plan Replete K Follow BMP  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 Neg LE duplex Plan  Changed to LMWH given heparin levels not able to be measured on 3/17  Polysubstance abuse Opiate dependence Positive for opiates and THC on admission Plan Continue scheduled oxycodone, Klonopin Continue precedex and rotated to fixed dose dilaudid, decrease from 1.5mg /hr to 1mg /hr, consider further decrease 3/20 May benefit from ketamine at some point (reaching out to palliative today re: dosing protocol for narcotic sensitivity) Rass goal 0/-1 Delirium precautions   Fall, comminuted nasal bone fx Plan ENT will see him as outpatient when stable  Protein calorie malnutrition, mild Plan Cont tube feeds  Anemia of critical illness Hgb down to 6.6 from 7.1 no obvious bleeding. He is on therapeutic LMWH He is + 13 liters so ? Some component of dilution  Plan Transfuse for Hgb < 7   Best Practice (right click and "Reselect all SmartList Selections" daily)   Diet/type: tubefeeds  DVT prophylaxis LMWH Pressure ulcer(s): N/A GI prophylaxis:  PPI Lines: N/A Foley:  Yes, and it is still needed Code Status:  full code Last date of multidisciplinary goals of care discussion-parents updated at bedside  CC time: 35 min     Manaal Mandala Celine Mans, PA - [facility] For pager details, please see AMION or use Epic chat  After 1900, please call ELINK for cross coverage needs 06/25/2023, 9:19 AM

## 2023-06-26 DIAGNOSIS — N19 Unspecified kidney failure: Secondary | ICD-10-CM

## 2023-06-26 LAB — CBC
HCT: 22.4 % — ABNORMAL LOW (ref 39.0–52.0)
Hemoglobin: 6.9 g/dL — CL (ref 13.0–17.0)
MCH: 27.6 pg (ref 26.0–34.0)
MCHC: 30.8 g/dL (ref 30.0–36.0)
MCV: 89.6 fL (ref 80.0–100.0)
Platelets: 318 10*3/uL (ref 150–400)
RBC: 2.5 MIL/uL — ABNORMAL LOW (ref 4.22–5.81)
RDW: 17.8 % — ABNORMAL HIGH (ref 11.5–15.5)
WBC: 7 10*3/uL (ref 4.0–10.5)
nRBC: 0 % (ref 0.0–0.2)

## 2023-06-26 LAB — GLUCOSE, CAPILLARY
Glucose-Capillary: 124 mg/dL — ABNORMAL HIGH (ref 70–99)
Glucose-Capillary: 121 mg/dL — ABNORMAL HIGH (ref 70–99)
Glucose-Capillary: 138 mg/dL — ABNORMAL HIGH (ref 70–99)
Glucose-Capillary: 160 mg/dL — ABNORMAL HIGH (ref 70–99)
Glucose-Capillary: 163 mg/dL — ABNORMAL HIGH (ref 70–99)
Glucose-Capillary: 136 mg/dL — ABNORMAL HIGH (ref 70–99)

## 2023-06-26 LAB — BASIC METABOLIC PANEL WITH GFR
Anion gap: 7 (ref 5–15)
BUN: 21 mg/dL — ABNORMAL HIGH (ref 6–20)
CO2: 30 mmol/L (ref 22–32)
Calcium: 7.4 mg/dL — ABNORMAL LOW (ref 8.9–10.3)
Chloride: 103 mmol/L (ref 98–111)
Creatinine, Ser: 0.88 mg/dL (ref 0.61–1.24)
GFR, Estimated: >60 mL/min (ref >60)
Glucose, Bld: 114 mg/dL — ABNORMAL HIGH (ref 70–99)
Potassium: 3.6 mmol/L (ref 3.5–5.1)
Sodium: 140 mmol/L (ref 135–145)

## 2023-06-26 LAB — PHOSPHORUS: Phosphorus: 3.7 mg/dL (ref 2.5–4.6)

## 2023-06-26 LAB — PREPARE RBC (CROSSMATCH)

## 2023-06-26 LAB — MAGNESIUM: Magnesium: 1.6 mg/dL — ABNORMAL LOW (ref 1.7–2.4)

## 2023-06-26 LAB — HEMOGLOBIN AND HEMATOCRIT, BLOOD
HCT: 30.4 % — ABNORMAL LOW (ref 39.0–52.0)
Hemoglobin: 9.6 g/dL — ABNORMAL LOW (ref 13.0–17.0)

## 2023-06-26 MED ORDER — GUAIFENESIN 100 MG/5ML PO LIQD
20.0000 mL | Freq: Two times a day (BID) | ORAL | Status: DC
  Administered 2023-06-26 – 2023-06-30 (×10): 20 mL
  Filled 2023-06-26 (×10): qty 20

## 2023-06-26 MED ORDER — STERILE WATER FOR INJECTION IJ SOLN
INTRAMUSCULAR | Status: AC
  Administered 2023-06-26: 10 mL
  Filled 2023-06-26: qty 10

## 2023-06-26 MED ORDER — FUROSEMIDE 10 MG/ML IJ SOLN
40.0000 mg | Freq: Two times a day (BID) | INTRAMUSCULAR | Status: AC
  Administered 2023-06-26 (×2): 40 mg via INTRAVENOUS
  Filled 2023-06-26 (×2): qty 4

## 2023-06-26 MED ORDER — OXYCODONE HCL 5 MG PO TABS
10.0000 mg | ORAL_TABLET | ORAL | Status: DC
  Administered 2023-06-26 – 2023-06-30 (×25): 10 mg
  Filled 2023-06-26 (×25): qty 2

## 2023-06-26 MED ORDER — MAGNESIUM SULFATE 2 GM/50ML IV SOLN
2.0000 g | Freq: Once | INTRAVENOUS | Status: AC
  Administered 2023-06-26: 2 g via INTRAVENOUS
  Filled 2023-06-26: qty 50

## 2023-06-26 MED ORDER — FREE WATER
100.0000 mL | Freq: Three times a day (TID) | Status: DC
  Administered 2023-06-26 – 2023-06-30 (×12): 100 mL

## 2023-06-26 MED ORDER — MIDAZOLAM HCL 2 MG/2ML IJ SOLN
1.0000 mg | INTRAMUSCULAR | Status: DC | PRN
  Administered 2023-06-26 – 2023-06-29 (×3): 2 mg via INTRAVENOUS
  Filled 2023-06-26 (×3): qty 2

## 2023-06-26 MED ORDER — ALBUTEROL SULFATE (2.5 MG/3ML) 0.083% IN NEBU
2.5000 mg | INHALATION_SOLUTION | RESPIRATORY_TRACT | Status: DC | PRN
  Administered 2023-06-26 – 2023-06-30 (×8): 2.5 mg via RESPIRATORY_TRACT
  Filled 2023-06-26 (×7): qty 3

## 2023-06-26 MED ORDER — CALCIUM GLUCONATE-NACL 1-0.675 GM/50ML-% IV SOLN: 1.0000 g | Freq: Once | INTRAVENOUS | Status: DC

## 2023-06-26 MED ORDER — POTASSIUM CHLORIDE 20 MEQ PO PACK
40.0000 meq | PACK | Freq: Two times a day (BID) | ORAL | Status: AC
  Administered 2023-06-26 (×2): 40 meq
  Filled 2023-06-26 (×2): qty 2

## 2023-06-26 MED ORDER — ALBUMIN HUMAN 25 % IV SOLN
12.5000 g | Freq: Three times a day (TID) | INTRAVENOUS | Status: AC
  Administered 2023-06-26 (×2): 12.5 g via INTRAVENOUS
  Filled 2023-06-26 (×2): qty 50

## 2023-06-26 MED ORDER — IBUPROFEN 100 MG/5ML PO SUSP
800.0000 mg | Freq: Three times a day (TID) | ORAL | Status: DC | PRN
  Administered 2023-06-26 – 2023-06-30 (×8): 800 mg
  Filled 2023-06-26 (×11): qty 40

## 2023-06-26 MED ORDER — SODIUM CHLORIDE 0.9% IV SOLUTION: Freq: Once | INTRAVENOUS | Status: AC

## 2023-06-26 NOTE — Progress Notes (Signed)
 Pharmacy Antibiotic Note  Brendan Preston is a 48 y.o. male admitted on 06/13/2023 with bacteremia.  Pharmacy has been consulted for Vancomycin + Ciprofloxacin dosing.  A Vanc peak/trough resulted as 33/17 mcg/ml respectively for a calculated AUC of 618 which is SUPRAtherapeutic   Renal function has been relatively stable since AKI recovery with fluctuations between 0.8-1.1, SCr 1.07 today  Plan: - Reduce Vancomycin to 500 mg IV every 12 hours (eAUC 412, VT 11) - Continue Ciprofloxacin 400 mg IV every 8 hours - stop date 3/23 for 7d LOT - Will continue to follow renal function, culture results, LOT, and antibiotic de-escalation plans   Height: 5\' 11"  (180.3 cm) Weight: 67.3 kg (148 lb 5.9 oz) IBW/kg (Calculated) : 75.3  Temp (24hrs), Avg:99.8 F (37.7 C), Min:98.3 F (36.8 C), Max:102 F (38.9 C)  Recent Labs  Lab 06/23/23 0314 06/23/23 0907 06/23/23 1248 06/24/23 0331 06/24/23 1410 06/25/23 0615 06/25/23 0631 06/26/23 0548 06/27/23 0030 06/27/23 0058 06/27/23 0439 06/27/23 1010  WBC 13.1*  --   --  10.1  --   --  9.0 7.0  --   --  8.2  --   CREATININE 1.03  --   --  0.96 1.10 1.00  --  0.88  --  1.07  --   --   VANCOTROUGH  --  18  --   --   --   --   --   --   --   --   --  17  VANCOPEAK  --   --  31  --   --   --   --   --  33  --   --   --     Estimated Creatinine Clearance: 79.5 mL/min (by C-G formula based on SCr of 1.07 mg/dL).    Allergies  Allergen Reactions   Penicillins Other (See Comments)    Noted on prior chart. Rash to cefepime 06/13/23, but tolerated ceftriaxone in 2020. Mother is unsure if he is truly allergic to penicillin. (2025)   Cefepime Rash    Antimicrobials this admission: Vancomycin 3/7 >> 3/17 Cefepime 3/7 x 1 Ceftriaxone 3/8 >> 3/9 Meropenem 3/17 >> Cipro 3/19 >>  Dose adjustments this admission: 3/8 VR 17 mcg/ml 3/13 VP/VT 26/18 mcg/ml - AUC 543 3/17 VP/VT 31/18 mcg/ml - AUC 600 3/21 VP/VT 33/17 mcg/ml - AUC 618 - reduce to 500  mg/12h (eAUC 412, VT 11)  Microbiology results: 3/7 BCx 4/4 MRSA 3/9 BCx >> NGf 3/12 RCx (TA) >> MRSA 3/16 RCx (TA) >> staph aureus + PsA  Thank you for allowing pharmacy to be a part of this patient's care.  Georgina Pillion, PharmD, BCPS, BCIDP Infectious Diseases Clinical Pharmacist 06/27/2023 1:15 PM   **Pharmacist phone directory can now be found on [email or web address] ([email or web address]).  Listed under [facility].

## 2023-06-26 NOTE — Progress Notes (Addendum)
 Replaced ETT holder - uneventful. ETT residing at 25cm lip.  PT did not tolerate 1200 CPT- states he feels he could pass out- RN aware.

## 2023-06-26 NOTE — Progress Notes (Signed)
 eLink Physician-Brief Progress Note Patient Name: Brendan Preston DOB: [birth year] MRN: [id number]   Date of Service  06/26/2023  HPI/Events of Note  Hg 6.9; slow down trend, no active bleedign  eICU Interventions  1u pRBC     Intervention Category Intermediate Interventions: Bleeding - evaluation and treatment with blood products  Neil Errickson 06/26/2023, 6:35 AM

## 2023-06-26 NOTE — Progress Notes (Signed)
 NAME:  Brendan Preston, MRN:  [id number], DOB:  04/07/1976, LOS: 13 ADMISSION DATE:  06/13/2023, CONSULTATION DATE:  06/13/23 REFERRING MD:  Dr. Doran Durand, CHIEF COMPLAINT:  AMS   History of Present Illness:  Pt encephalopathic, therefore HPI obtained from chart review.  See additional chart w/MRN [id number]  42 yoM with prior hx of known polysubstance (heroin/ fentanyl) abuse in which EMS was called out for AMS, last seen awake 3 days ago covered in urine and feces.   In ER, temp 104.3 rectal, tachycardic, normo to slightly hypertensive, sats 100% on NRB> but maintaining sats on RA with GCS ~10 but protecting airway.  Labs significant for lactic 8.4, WBC 15, plts 96, VBG 7.29/ 44,  CXR neg, RVP neg, EKG without ST changes, qtc borderline, CTH official read pending, trop hs 694, INR 1.3.  Foley placed- urine cloudy, glucose 150, Hgb 150 (-RBC), neg leuks/nitrates, WBC 6-10, UDS positive for THC and opiates.  CMET and other labs had to be redrawn and still awaiting due to hemolysis.  Cultures sent, empiric vanc/ cefepime given, 2L LR.  PCCM called for admit.  Pertinent  Medical History  Tobacco abuse, polysubstance abuse/ IVDA, ETOH unclear  Significant Hospital Events: Including procedures, antibiotic start and stop dates in addition to other pertinent events   3/7 admit, encephalopathic, altered, drug rash developed after cefepime 3/8 TTE negative for endocarditis 3/10 he had a TEE scheduled but sounds like he has declined this . Larey Seat out of bed late day shift and broke his nose. Bcx still pos. 3/11 Tubed in afternoon. CRP high.  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 Still having marked trach secretions. Growing PA so added meropenem (given his rash w/ cefepime). Changed fent to dilaudid at 2mg /hr. CT clamped.  3/18 hgb down to 6.6 got 1 unit PRBC. No obvious bleeding source. Was >13 liters positive so also starting diuretics. Dec'd dilaudid dose to 1.5mg /hr. no PTX so CT removed.  3/19 tolerated 7 hours PSV 10/5 but had quite a lot of secretions requiring frequent suctioning  Interim History / Subjective:  Temp 100.4 overnight. Hgb 6.9, 1u PRBC ordered. Tolerated PSV 7 hours yesterday but quite a lot of secretions requiring frequent suctioning.  Objective   Blood pressure (!) 98/48, pulse 75, temperature 100.2 F (37.9 C), temperature source Bladder, resp. rate 16, height (P) 5\' 11"  (1.803 m), weight 68.6 kg, SpO2 98%.    Vent Mode: PRVC FiO2 (%):  [40 %] 40 % Set Rate:  [16 bmp] 16 bmp Vt Set:  [600 mL] 600 mL PEEP:  [5 cmH20] 5 cmH20 Pressure Support:  [8 cmH20-10 cmH20] 8 cmH20 Plateau Pressure:  [18 cmH20-21 cmH20] 21 cmH20   Intake/Output Summary (Last 24 hours) at 06/26/2023 0740 Last data filed at 06/26/2023 0650 Gross per 24 hour  Intake 2965.51 ml  Output 4500 ml  Net -1534.49 ml   Filed Weights   06/21/23 0711 06/25/23 0500 06/26/23 0600  Weight: 67 kg 70.6 kg 68.6 kg    Examination:  General: Adult male, chronically ill appearing, in NAD. Neuro: Sedated but opens eyes to voice, follows intermittent basic commands, writes on  paper HEENT: [place]/AT. Sclerae anicteric. ETT in place.  Cardiovascular: RRR, no M/R/G.  Lungs: Respirations even and unlabored.  Rhonchi bilaterally. Moderately thick secretions suctioned via ETT. Abdomen: BS x 4, soft, NT/ND.  Musculoskeletal: No gross deformities, no edema.  Skin: Intact, warm, no rashes.   Resolved Hospital Problem list   Cefepime drug rash Encephalopathy  AKI  Shock  Pneumothorax. Resolved  3/18 chest tube removed  Assessment & Plan:   Sepsis with MRSA bacteremia, Cavitary pneumonia MRSA and then pseudomonas complicated by septic emboli and osteo to spine TEE neg for endocarditis. Repeat blood cx from 3/9 no growth. Sputum cx 3/16 with few gpc and few gnr. Currently afebrile. WBC 13.1. ID following Plan Continue Vanc day 13/x. Will need prolonged therapy Continue Cipro day 2/x (changed from Merrem based on sensitivities) Additional recs as per ID   Acute hypoxic respiratory failure due to ARDS with necrotizing MRSA PNA, septic emboli and pseudomonas PNA Left Pneumothorax- s/p chest tube placement 3/14 with subsequent removal 3/18 Plan Cont full vent support w/ daily assessment for readiness to wean and SBT Treating PNA VAP bundle  PAD protocol RASS goal -1 Pulm hygiene (continue HT saline) Continue chest PT Lasix 40mg  BID today along with Albumin x 2 May need trach if secretions continue to be a burden/barrier to extubation, he is gradually tolerating longer periods of SBT which is encouraging (though he requires frequent suctioning and per nursing staff, he had a very weak cough prior to intubation)  Acute HFrEF (45-50%) Possible PFO  TEE w possible small PFO grade 1 L to R shunt  Plan Consider repeat TTE after acute illness Cont tele  Daily assessment for diuretics - 40mg  BID today  Fluid and electrolyte imbalance: Hypomagnesemia Plan Replete Mg Follow BMP  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 Neg LE duplex Plan  Changed to LMWH given heparin levels not able to be measured on 3/17  Polysubstance abuse Opiate dependence Positive for opiates and THC on admission Plan Continue scheduled oxycodone, Klonopin Continue precedex and rotated to fixed dose dilaudid, decrease from 1mg /hr to 0.5mg /hr, consider further decrease 3/21 Rass goal 0/-1 Delirium precautions   Fall, comminuted nasal bone fx Plan ENT will see him as outpatient when stable  Protein calorie  malnutrition, mild Plan Cont tube feeds  Anemia of critical illness Hgb down to 6.9 from 7.1 no obvious bleeding. He is on therapeutic LMWH He is + 10 liters so ? Some component of dilution  Plan Follow H/H (he is s/p 1u PRBC 3/20 AM) Lasix as above   Best Practice (right click and "Reselect all SmartList Selections" daily)   Diet/type: tubefeeds  DVT prophylaxis LMWH Pressure ulcer(s): N/A GI prophylaxis: PPI Lines: N/A Foley:  Yes, and it is still needed Code Status:  full code Last date of multidisciplinary goals of care discussion-parents updated at bedside  CC time: 35 min     Diann Bangerter Celine Mans, PA - [facility] For pager details, please see AMION or use Epic chat  After 1900, please call [facility] for cross coverage needs 06/26/2023, 7:40 AM

## 2023-06-27 DIAGNOSIS — G0489 Other myelitis: Secondary | ICD-10-CM

## 2023-06-27 DIAGNOSIS — M4646 Discitis, unspecified, lumbar region: Secondary | ICD-10-CM | POA: Diagnosis present

## 2023-06-27 DIAGNOSIS — J9601 Acute respiratory failure with hypoxia: Secondary | ICD-10-CM | POA: Diagnosis not present

## 2023-06-27 DIAGNOSIS — M4640 Discitis, unspecified, site unspecified: Secondary | ICD-10-CM | POA: Diagnosis present

## 2023-06-27 DIAGNOSIS — F1990 Other psychoactive substance use, unspecified, uncomplicated: Secondary | ICD-10-CM

## 2023-06-27 DIAGNOSIS — I079 Rheumatic tricuspid valve disease, unspecified: Secondary | ICD-10-CM

## 2023-06-27 DIAGNOSIS — J9690 Respiratory failure, unspecified, unspecified whether with hypoxia or hypercapnia: Secondary | ICD-10-CM | POA: Diagnosis not present

## 2023-06-27 LAB — CBC
HCT: 27.6 % — ABNORMAL LOW (ref 39.0–52.0)
Hemoglobin: 8.8 g/dL — ABNORMAL LOW (ref 13.0–17.0)
MCH: 27.8 pg (ref 26.0–34.0)
MCHC: 31.9 g/dL (ref 30.0–36.0)
MCV: 87.3 fL (ref 80.0–100.0)
Platelets: 337 10*3/uL (ref 150–400)
RBC: 3.16 MIL/uL — ABNORMAL LOW (ref 4.22–5.81)
RDW: 16.8 % — ABNORMAL HIGH (ref 11.5–15.5)
WBC: 8.2 10*3/uL (ref 4.0–10.5)
nRBC: 0 % (ref 0.0–0.2)

## 2023-06-27 LAB — BASIC METABOLIC PANEL WITH GFR
Anion gap: 8 (ref 5–15)
BUN: 23 mg/dL — ABNORMAL HIGH (ref 6–20)
CO2: 30 mmol/L (ref 22–32)
Calcium: 7.3 mg/dL — ABNORMAL LOW (ref 8.9–10.3)
Chloride: 99 mmol/L (ref 98–111)
Creatinine, Ser: 1.07 mg/dL (ref 0.61–1.24)
GFR, Estimated: >60 mL/min (ref >60)
Glucose, Bld: 163 mg/dL — ABNORMAL HIGH (ref 70–99)
Potassium: 3.7 mmol/L (ref 3.5–5.1)
Sodium: 137 mmol/L (ref 135–145)

## 2023-06-27 LAB — PHOSPHORUS: Phosphorus: 4.5 mg/dL (ref 2.5–4.6)

## 2023-06-27 LAB — CULTURE, BLOOD (ROUTINE X 2): Special Requests: ADEQUATE

## 2023-06-27 LAB — GLUCOSE, CAPILLARY
Glucose-Capillary: 118 mg/dL — ABNORMAL HIGH (ref 70–99)
Glucose-Capillary: 153 mg/dL — ABNORMAL HIGH (ref 70–99)
Glucose-Capillary: 125 mg/dL — ABNORMAL HIGH (ref 70–99)
Glucose-Capillary: 164 mg/dL — ABNORMAL HIGH (ref 70–99)
Glucose-Capillary: 139 mg/dL — ABNORMAL HIGH (ref 70–99)

## 2023-06-27 LAB — TYPE AND SCREEN
ABO/RH(D): O POS
Antibody Screen: NEGATIVE
Unit division: 0
Unit division: 0

## 2023-06-27 LAB — BPAM RBC
Blood Product Expiration Date: 202504132359
Blood Product Expiration Date: 202504202359
ISSUE DATE / TIME: 202503180546
ISSUE DATE / TIME: 202503200908
Unit Type and Rh: 5100
Unit Type and Rh: 5100

## 2023-06-27 LAB — VANCOMYCIN, PEAK: Vancomycin Pk: 33 ug/mL (ref 30–40)

## 2023-06-27 LAB — MAGNESIUM: Magnesium: 1.9 mg/dL (ref 1.7–2.4)

## 2023-06-27 LAB — VANCOMYCIN, TROUGH: Vancomycin Tr: 17 ug/mL (ref 15–20)

## 2023-06-27 MED ORDER — LIDOCAINE HCL (PF) 2% IJ FOR NEBU
5.0000 mL | Freq: Once | RESPIRATORY_TRACT | Status: AC
  Administered 2023-06-27: 5 mL via RESPIRATORY_TRACT
  Filled 2023-06-27: qty 5

## 2023-06-27 MED ORDER — MAGNESIUM SULFATE 2 GM/50ML IV SOLN
2.0000 g | Freq: Once | INTRAVENOUS | Status: AC
  Administered 2023-06-27: 2 g via INTRAVENOUS
  Filled 2023-06-27: qty 50

## 2023-06-27 MED ORDER — POTASSIUM CHLORIDE 20 MEQ PO PACK
40.0000 meq | PACK | Freq: Once | ORAL | Status: AC
  Administered 2023-06-27: 40 meq
  Filled 2023-06-27: qty 2

## 2023-06-27 MED ORDER — VANCOMYCIN HCL 500 MG IV SOLR
500.0000 mg | Freq: Two times a day (BID) | INTRAVENOUS | Status: AC
  Administered 2023-06-27 – 2023-07-04 (×14): 500 mg via INTRAVENOUS
  Filled 2023-06-27: qty 10
  Filled 2023-06-27: qty 500
  Filled 2023-06-27: qty 10
  Filled 2023-06-27: qty 500
  Filled 2023-06-27 (×5): qty 10
  Filled 2023-06-27: qty 500
  Filled 2023-06-27 (×6): qty 10

## 2023-06-27 MED ORDER — FENTANYL CITRATE PF 50 MCG/ML IJ SOSY
50.0000 ug | PREFILLED_SYRINGE | INTRAMUSCULAR | Status: DC | PRN
  Administered 2023-06-27 – 2023-06-28 (×4): 100 ug via INTRAVENOUS
  Administered 2023-06-28: 50 ug via INTRAVENOUS
  Administered 2023-06-28: 100 ug via INTRAVENOUS
  Administered 2023-06-28: 50 ug via INTRAVENOUS
  Administered 2023-06-28 – 2023-06-29 (×5): 100 ug via INTRAVENOUS
  Administered 2023-06-30: 50 ug via INTRAVENOUS
  Administered 2023-06-30: 100 ug via INTRAVENOUS
  Filled 2023-06-27 (×4): qty 2
  Filled 2023-06-27: qty 1
  Filled 2023-06-27: qty 2
  Filled 2023-06-27: qty 1
  Filled 2023-06-27: qty 2
  Filled 2023-06-27: qty 1
  Filled 2023-06-27 (×3): qty 2
  Filled 2023-06-27: qty 1
  Filled 2023-06-27 (×3): qty 2

## 2023-06-27 MED ORDER — HYDROCOD POLI-CHLORPHE POLI ER 10-8 MG/5ML PO SUER
5.0000 mL | Freq: Two times a day (BID) | ORAL | Status: DC
  Administered 2023-06-27 – 2023-06-30 (×8): 5 mL
  Filled 2023-06-27 (×8): qty 5

## 2023-06-27 MED ORDER — FUROSEMIDE 10 MG/ML IJ SOLN
40.0000 mg | Freq: Two times a day (BID) | INTRAMUSCULAR | Status: AC
  Administered 2023-06-27 – 2023-06-29 (×6): 40 mg via INTRAVENOUS
  Filled 2023-06-27 (×6): qty 4

## 2023-06-27 MED ORDER — VITAL 1.5 CAL PO LIQD
1000.0000 mL | ORAL | Status: DC
  Administered 2023-06-27 – 2023-06-29 (×3): 1000 mL
  Filled 2023-06-27 (×5): qty 1000

## 2023-06-27 MED ORDER — POTASSIUM CHLORIDE 20 MEQ PO PACK
40.0000 meq | PACK | Freq: Two times a day (BID) | ORAL | Status: AC
  Administered 2023-06-27 – 2023-06-29 (×6): 40 meq
  Filled 2023-06-27 (×6): qty 2

## 2023-06-27 MED ORDER — VANCOMYCIN HCL 500 MG/100ML IV SOLN
500.0000 mg | Freq: Two times a day (BID) | INTRAVENOUS | Status: DC
  Filled 2023-06-27: qty 100

## 2023-06-27 MED ORDER — VANCOMYCIN HCL 500 MG/100ML IV SOLN
500.0000 mg | Freq: Two times a day (BID) | INTRAVENOUS | Status: DC
Start: 2023-06-27 — End: 2023-06-27

## 2023-06-27 NOTE — Progress Notes (Signed)
 CPT on hold at this time due to patient being SOB

## 2023-06-27 NOTE — Plan of Care (Signed)
  Problem: Education: Goal: Knowledge of General Education information will improve Description: Including pain rating scale, medication(s)/side effects and non-pharmacologic comfort measures Outcome: Progressing   Problem: Clinical Measurements: Goal: Ability to maintain clinical measurements within normal limits will improve Outcome: Progressing Goal: Cardiovascular complication will be avoided Outcome: Progressing   Problem: Nutrition: Goal: Adequate nutrition will be maintained Outcome: Progressing   Problem: Coping: Goal: Level of anxiety will decrease Outcome: Progressing   Problem: Elimination: Goal: Will not experience complications related to bowel motility Outcome: Progressing Goal: Will not experience complications related to urinary retention Outcome: Progressing   Problem: Pain Managment: Goal: General experience of comfort will improve and/or be controlled Outcome: Progressing   Problem: Clinical Measurements: Goal: Respiratory complications will improve Outcome: Not Progressing   Problem: Activity: Goal: Risk for activity intolerance will decrease Outcome: Not Progressing

## 2023-06-27 NOTE — Progress Notes (Signed)
 eLink Physician-Brief Progress Note Patient Name: Brendan Preston DOB: November 30, 1975 MRN: [id number]   Date of Service  06/27/2023  HPI/Events of Note  Excessive coughing.  Endotracheal tube was moved up by 2 cm due to excessive coughing.  eICU Interventions  Patient's chart briefly reviewed.  Video assessment of patient done.  Advance ET tube by 2 cm.  Administer nebulized lidocaine to help reduce irritation from the tube causing patient to cough.     Intervention Category Evaluation Type: Other  Carilyn Goodpasture 06/27/2023, 10:13 PM

## 2023-06-27 NOTE — Progress Notes (Signed)
 [facility] ADULT ICU REPLACEMENT PROTOCOL   The patient does apply for the [facility] Adult ICU Electrolyte Replacment Protocol based on the criteria listed below:   1.Exclusion criteria: TCTS, ECMO, Dialysis, and Myasthenia Gravis patients 2. Is GFR >/= 30 ml/min? Yes.    Patient's GFR today is >60 3. Is SCr </= 2? Yes.   Patient's SCr is 1.07 mg/dL 4. Did SCr increase >/= 0.5 in 24 hours? No. 5.Pt's weight >40kg  Yes.   6. Abnormal electrolyte(s): K+ = 3.7, Mg = 1.9  7. Electrolytes replaced per protocol 8.  Call MD STAT for K+ </= 2.5, Phos </= 1, or Mag </= 1 Physician:  Delia Chimes, eMD   Faye Ramsay 06/27/2023 3:31 AM

## 2023-06-27 NOTE — Progress Notes (Signed)
 Subjective:  intubated   Antibiotics:  Anti-infectives (From admission, onward)    Start     Dose/Rate Route Frequency Ordered Stop   06/27/23 2200  vancomycin (VANCOREADY) IVPB 500 mg/100 mL        500 mg 100 mL/hr over 60 Minutes Intravenous Every 12 hours 06/27/23 1140     06/25/23 1400  ciprofloxacin (CIPRO) IVPB 400 mg        400 mg 200 mL/hr over 60 Minutes Intravenous Every 8 hours 06/25/23 0748 06/29/23 2359   06/23/23 2200  meropenem (MERREM) 1 g in sodium chloride 0.9 % 100 mL IVPB  Status:  Discontinued        1 g 200 mL/hr over 30 Minutes Intravenous Every 8 hours 06/23/23 1108 06/25/23 0748   06/23/23 1130  meropenem (MERREM) 1 g in sodium chloride 0.9 % 100 mL IVPB        1 g 200 mL/hr over 30 Minutes Intravenous  Once 06/23/23 1057 06/23/23 1140   06/17/23 1000  vancomycin (VANCOREADY) IVPB 1250 mg/250 mL  Status:  Discontinued        1,250 mg 166.7 mL/hr over 90 Minutes Intravenous Every 24 hours 06/17/23 0709 06/17/23 0818   06/17/23 0900  vancomycin (VANCOREADY) IVPB 750 mg/150 mL  Status:  Discontinued        750 mg 150 mL/hr over 60 Minutes Intravenous Every 12 hours 06/17/23 0818 06/27/23 1140   06/16/23 1030  vancomycin (VANCOCIN) IVPB 1000 mg/200 mL premix  Status:  Discontinued        1,000 mg 200 mL/hr over 60 Minutes Intravenous Every 24 hours 06/16/23 1022 06/17/23 0709   06/15/23 1345  cefTRIAXone (ROCEPHIN) 2 g in sodium chloride 0.9 % 100 mL IVPB  Status:  Discontinued        2 g 200 mL/hr over 30 Minutes Intravenous Every 24 hours 06/15/23 1249 06/15/23 2226   06/14/23 1000  vancomycin (VANCOREADY) IVPB 750 mg/150 mL  Status:  Discontinued        750 mg 150 mL/hr over 60 Minutes Intravenous Every 24 hours 06/14/23 0852 06/16/23 1022   06/13/23 1940  cefTRIAXone (ROCEPHIN) 2 g in sodium chloride 0.9 % 100 mL IVPB  Status:  Discontinued        2 g 200 mL/hr over 30 Minutes Intravenous 2 times daily 06/13/23 1940 06/14/23 0802    06/13/23 1830  cefTRIAXone (ROCEPHIN) 2 g in sodium chloride 0.9 % 100 mL IVPB  Status:  Discontinued        2 g 200 mL/hr over 30 Minutes Intravenous Every 24 hours 06/13/23 1803 06/13/23 1940   06/13/23 1445  vancomycin (VANCOCIN) 500 mg in sodium chloride 0.9 % 100 mL IVPB        500 mg 100 mL/hr over 60 Minutes Intravenous  Once 06/13/23 1430 06/13/23 1627   06/13/23 1430  vancomycin (VANCOREADY) IVPB 500 mg/100 mL  Status:  Discontinued        500 mg 100 mL/hr over 60 Minutes Intravenous  Once 06/13/23 1426 06/13/23 1430   06/13/23 1400  vancomycin (VANCOCIN) IVPB 1000 mg/200 mL premix        1,000 mg 200 mL/hr over 60 Minutes Intravenous  Once 06/13/23 1354 06/13/23 1527   06/13/23 1345  ceFEPIme (MAXIPIME) 2 g in sodium chloride 0.9 % 100 mL IVPB        2 g 200 mL/hr over 30 Minutes Intravenous  Once 06/13/23 1335 06/13/23 1449  Medications: Scheduled Meds:  Chlorhexidine Gluconate Cloth  6 each Topical Daily   chlorpheniramine-HYDROcodone  5 mL Per Tube Q12H   clonazePAM  1 mg Per Tube BID   enoxaparin (LOVENOX) injection  70 mg Subcutaneous Q12H   feeding supplement (PROSource TF20)  60 mL Per Tube Daily   free water  100 mL Per Tube Q8H   furosemide  40 mg Intravenous Q12H   guaiFENesin  20 mL Per Tube BID   insulin aspart  0-9 Units Subcutaneous Q4H   lidocaine  1 patch Transdermal Q24H   mouth rinse  15 mL Mouth Rinse Q2H   oxyCODONE  10 mg Per Tube Q4H   pantoprazole (PROTONIX) IV  40 mg Intravenous Q24H   polyethylene glycol  17 g Per Tube Daily   potassium chloride  40 mEq Per Tube BID   senna-docusate  2 tablet Per Tube BID   sodium chloride flush  10-40 mL Intracatheter Q12H   sodium chloride flush  3-10 mL Intravenous Q12H   thiamine (VITAMIN B1) injection  100 mg Intravenous Daily   Continuous Infusions:  ciprofloxacin Stopped (06/27/23 1536)   dexmedetomidine (PRECEDEX) IV infusion 0.8 mcg/kg/hr (06/27/23 1538)   feeding supplement (VITAL 1.5 CAL)  60 mL/hr at 06/27/23 1538   vancomycin     PRN Meds:.acetaminophen (TYLENOL) oral liquid 160 mg/5 mL, acetaminophen, albuterol, bisacodyl, fentaNYL (SUBLIMAZE) injection, ibuprofen, methocarbamol, metoprolol tartrate, midazolam, mouth rinse, mouth rinse, sodium chloride flush, sodium chloride flush    Objective: Weight change:   Intake/Output Summary (Last 24 hours) at 06/27/2023 1619 Last data filed at 06/27/2023 1538 Gross per 24 hour  Intake 2769 ml  Output 5510 ml  Net -2741 ml   Blood pressure (!) 156/74, pulse (!) 109, temperature 99.5 F (37.5 C), temperature source Rectal, resp. rate (!) 21, height 5\' 11"  (1.803 m), weight 67.3 kg, SpO2 97%. Temp:  [98.3 F (36.8 C)-100.1 F (37.8 C)] 99.5 F (37.5 C) (03/21 0800) Pulse Rate:  [65-115] 109 (03/21 1530) Resp:  [16-29] 21 (03/21 1530) BP: (81-169)/(51-103) 156/74 (03/21 1500) SpO2:  [88 %-100 %] 97 % (03/21 1530) FiO2 (%):  [30 %] 30 % (03/21 1548) Weight:  [67.3 kg] 67.3 kg (03/21 0703)  Physical Exam: Physical Exam Constitutional:      Appearance: He is ill-appearing.     Interventions: He is intubated.  Eyes:     Extraocular Movements: Extraocular movements intact.  Cardiovascular:     Rate and Rhythm: Regular rhythm. Tachycardia present.  Pulmonary:     Effort: He is intubated.  Musculoskeletal:        General: Normal range of motion.  Neurological:     General: No focal deficit present.     Mental Status: He is alert and oriented to person, place, and time.      CBC:    BMET Recent Labs    06/26/23 0548 06/27/23 0058  NA 140 137  K 3.6 3.7  CL 103 99  CO2 30 30  GLUCOSE 114* 163*  BUN 21* 23*  CREATININE 0.88 1.07  CALCIUM 7.4* 7.3*     Liver Panel  Recent Labs    06/25/23 0615  PROT 6.0*  ALBUMIN <1.5*  AST 26  ALT 27  ALKPHOS 63  BILITOT 0.5       Sedimentation Rate No results for input(s): "ESRSEDRATE" in the last 72 hours. C-Reactive Protein No results for input(s):  "CRP" in the last 72 hours.  Micro Results: Recent Results (from the  past 720 hours)  Culture, blood (Routine X 2) w Reflex to ID Panel     Status: Abnormal   Collection Time: 06/13/23  1:49 PM   Specimen: BLOOD  Result Value Ref Range Status   Specimen Description   Final    BLOOD SITE NOT SPECIFIED Performed at [facility], [address] [place], [place] [address]    Special Requests   Final    BOTTLES DRAWN AEROBIC AND ANAEROBIC Blood Culture adequate volume Performed at [facility], [address] [place], [place] [address]    Culture  Setup Time   Final    GRAM POSITIVE COCCI IN CLUSTERS IN BOTH AEROBIC AND ANAEROBIC BOTTLES PHARMD E JACKSON 06/14/2023 @ 0551 BY AB    Culture (A)  Final    METHICILLIN RESISTANT STAPHYLOCOCCUS AUREUS SEE SEPARATE REPORT Performed at [facility], [address], [place], [place] [address]    Report Status 06/27/2023 FINAL  Final   Organism ID, Bacteria METHICILLIN RESISTANT STAPHYLOCOCCUS AUREUS  Final      Susceptibility   Methicillin resistant staphylococcus aureus - MIC*    CIPROFLOXACIN >=8 RESISTANT Resistant     ERYTHROMYCIN >=8 RESISTANT Resistant     GENTAMICIN <=0.5 SENSITIVE Sensitive     OXACILLIN >=4 RESISTANT Resistant     TETRACYCLINE <=1 SENSITIVE Sensitive     VANCOMYCIN 1 SENSITIVE Sensitive     TRIMETH/SULFA >=320 RESISTANT Resistant     CLINDAMYCIN <=0.25 SENSITIVE Sensitive     RIFAMPIN <=0.5 SENSITIVE Sensitive     Inducible Clindamycin NEGATIVE Sensitive     LINEZOLID 2 SENSITIVE Sensitive     * METHICILLIN RESISTANT STAPHYLOCOCCUS AUREUS  Blood Culture ID Panel (Reflexed)     Status: Abnormal   Collection Time: 06/13/23  1:49 PM  Result Value Ref Range Status   Enterococcus faecalis NOT DETECTED NOT DETECTED Final   Enterococcus Faecium NOT DETECTED NOT DETECTED Final   Listeria monocytogenes NOT DETECTED NOT DETECTED Final   Staphylococcus species DETECTED (A)  NOT DETECTED Final    Comment: CRITICAL RESULT CALLED TO, READ BACK BY AND VERIFIED WITH: PHARMD E JACKSON 06/14/2023 @ 0551 BY AB    Staphylococcus aureus (BCID) DETECTED (A) NOT DETECTED Final    Comment: Methicillin (oxacillin)-resistant Staphylococcus aureus (MRSA). MRSA is predictably resistant to beta-lactam antibiotics (except ceftaroline). Preferred therapy is vancomycin unless clinically contraindicated. Patient requires contact precautions if  hospitalized. CRITICAL RESULT CALLED TO, READ BACK BY AND VERIFIED WITH: PHARMD E JACKSON 06/14/2023 @ 0551 BY AB    Staphylococcus epidermidis NOT DETECTED NOT DETECTED Final   Staphylococcus lugdunensis NOT DETECTED NOT DETECTED Final   Streptococcus species NOT DETECTED NOT DETECTED Final   Streptococcus agalactiae NOT DETECTED NOT DETECTED Final   Streptococcus pneumoniae NOT DETECTED NOT DETECTED Final   Streptococcus pyogenes NOT DETECTED NOT DETECTED Final   A.calcoaceticus-baumannii NOT DETECTED NOT DETECTED Final   Bacteroides fragilis NOT DETECTED NOT DETECTED Final   Enterobacterales NOT DETECTED NOT DETECTED Final   Enterobacter cloacae complex NOT DETECTED NOT DETECTED Final   Escherichia coli NOT DETECTED NOT DETECTED Final   Klebsiella aerogenes NOT DETECTED NOT DETECTED Final   Klebsiella oxytoca NOT DETECTED NOT DETECTED Final   Klebsiella pneumoniae NOT DETECTED NOT DETECTED Final   Proteus species NOT DETECTED NOT DETECTED Final   Salmonella species NOT DETECTED NOT DETECTED Final   Serratia marcescens NOT DETECTED NOT DETECTED Final   Haemophilus influenzae NOT DETECTED NOT DETECTED Final  Neisseria meningitidis NOT DETECTED NOT DETECTED Final   Pseudomonas aeruginosa NOT DETECTED NOT DETECTED Final   Stenotrophomonas maltophilia NOT DETECTED NOT DETECTED Final   Candida albicans NOT DETECTED NOT DETECTED Final   Candida auris NOT DETECTED NOT DETECTED Final   Candida glabrata NOT DETECTED NOT DETECTED Final    Candida krusei NOT DETECTED NOT DETECTED Final   Candida parapsilosis NOT DETECTED NOT DETECTED Final   Candida tropicalis NOT DETECTED NOT DETECTED Final   Cryptococcus neoformans/gattii NOT DETECTED NOT DETECTED Final   Meth resistant mecA/C and MREJ DETECTED (A) NOT DETECTED Final    Comment: CRITICAL RESULT CALLED TO, READ BACK BY AND VERIFIED WITH: PHARMD E JACKSON 06/14/2023 @ 0551 BY AB Performed at [facility], [address], [place], [place] [address]   Blood culture (routine x 2)     Status: Abnormal   Collection Time: 06/13/23  1:55 PM   Specimen: BLOOD RIGHT ARM  Result Value Ref Range Status   Specimen Description   Final    BLOOD RIGHT ARM Performed at [facility], [address] [place], [place] [address]    Special Requests   Final    BOTTLES DRAWN AEROBIC AND ANAEROBIC Blood Culture results may not be optimal due to an inadequate volume of blood received in culture bottles Performed at [facility], [address] [place], [place] [address]    Culture  Setup Time   Final    GRAM POSITIVE COCCI IN CLUSTERS IN BOTH AEROBIC AND ANAEROBIC BOTTLES CRITICAL VALUE NOTED.  VALUE IS CONSISTENT WITH PREVIOUSLY REPORTED AND CALLED VALUE.    Culture (A)  Final    STAPHYLOCOCCUS AUREUS SUSCEPTIBILITIES PERFORMED ON PREVIOUS CULTURE WITHIN THE LAST 5 DAYS. Performed at [facility], [address] [place], [place] [address]    Report Status 06/17/2023 FINAL  Final  Resp panel by RT-PCR (RSV, Flu A&B, Covid) Anterior Nasal Swab     Status: None   Collection Time: 06/13/23  1:56 PM   Specimen: Anterior Nasal Swab  Result Value Ref Range Status   SARS Coronavirus 2 by RT PCR NEGATIVE NEGATIVE Final    Comment: (NOTE) SARS-CoV-2 target nucleic acids are NOT DETECTED.  The SARS-CoV-2 RNA is generally detectable in upper respiratory specimens during the acute phase of infection. The lowest concentration of SARS-CoV-2 viral copies this  assay can detect is 138 copies/mL. A negative result does not preclude SARS-Cov-2 infection and should not be used as the sole basis for treatment or other patient management decisions. A negative result may occur with  improper specimen collection/handling, submission of specimen other than nasopharyngeal swab, presence of viral mutation(s) within the areas targeted by this assay, and inadequate number of viral copies(<138 copies/mL). A negative result must be combined with clinical observations, patient history, and epidemiological information. The expected result is Negative.  Fact Sheet for Patients:  BloggerCourse.com  Fact Sheet for Healthcare Providers:  SeriousBroker.it  This test is no t yet approved or cleared by the [place] FDA and  has been authorized for detection and/or diagnosis of SARS-CoV-2 by FDA under an Emergency Use Authorization (EUA). This EUA will remain  in effect (meaning this test can be used) for the duration of the COVID-19 declaration under Section 564(b)(1) of the Act, 21 U.S.C.section 360bbb-3(b)(1), unless the authorization is terminated  or revoked sooner.       Influenza A by PCR NEGATIVE NEGATIVE Final   Influenza B by PCR  NEGATIVE NEGATIVE Final    Comment: (NOTE) The Xpert Xpress SARS-CoV-2/FLU/RSV plus assay is intended as an aid in the diagnosis of influenza from Nasopharyngeal swab specimens and should not be used as a sole basis for treatment. Nasal washings and aspirates are unacceptable for Xpert Xpress SARS-CoV-2/FLU/RSV testing.  Fact Sheet for Patients: BloggerCourse.com  Fact Sheet for Healthcare Providers: SeriousBroker.it  This test is not yet approved or cleared by the [place] FDA and has been authorized for detection and/or diagnosis of SARS-CoV-2 by FDA under an Emergency Use Authorization (EUA). This EUA will  remain in effect (meaning this test can be used) for the duration of the COVID-19 declaration under Section 564(b)(1) of the Act, 21 U.S.C. section 360bbb-3(b)(1), unless the authorization is terminated or revoked.     Resp Syncytial Virus by PCR NEGATIVE NEGATIVE Final    Comment: (NOTE) Fact Sheet for Patients: BloggerCourse.com  Fact Sheet for Healthcare Providers: SeriousBroker.it  This test is not yet approved or cleared by the [place] FDA and has been authorized for detection and/or diagnosis of SARS-CoV-2 by FDA under an Emergency Use Authorization (EUA). This EUA will remain in effect (meaning this test can be used) for the duration of the COVID-19 declaration under Section 564(b)(1) of the Act, 21 U.S.C. section 360bbb-3(b)(1), unless the authorization is terminated or revoked.  Performed at [facility], [address], [place], [place] [address]   Urine Culture     Status: None   Collection Time: 06/13/23  2:27 PM   Specimen: Urine, Clean Catch  Result Value Ref Range Status   Specimen Description   Final    URINE, CLEAN CATCH Performed at [facility], [address] [place], [place] [address]    Special Requests   Final    NONE Performed at [facility], [address], [place], [place] [address]    Culture   Final    NO GROWTH Performed at [facility], [address], [place], [place] [address]    Report Status 06/14/2023 FINAL  Final  Culture, blood (Routine X 2) w Reflex to ID Panel     Status: None   Collection Time: 06/15/23 10:27 PM   Specimen: BLOOD RIGHT HAND  Result Value Ref Range Status   Specimen Description   Final    BLOOD RIGHT HAND Performed at [facility], [address], [place], [place] [address]    Special Requests   Final    BOTTLES DRAWN AEROBIC ONLY Blood Culture results may not be optimal due to an  inadequate volume of blood received in culture bottles Performed at [facility], [address], [place], [place] [address]    Culture   Final    NO GROWTH 5 DAYS Performed at [facility], [address] [place], [place] [address]    Report Status 06/21/2023 FINAL  Final  Culture, blood (Routine X 2) w Reflex to ID Panel     Status: None   Collection Time: 06/15/23 10:27 PM   Specimen: BLOOD RIGHT HAND  Result Value Ref Range Status   Specimen Description   Final    BLOOD RIGHT HAND Performed at [facility], [address], [place], [place] [address]    Special Requests   Final    BOTTLES DRAWN AEROBIC ONLY Blood Culture results may not be optimal due to an inadequate volume of blood received in culture bottles Performed at  [facility], [address] [place], [place] [address]    Culture   Final    NO GROWTH 5 DAYS Performed at [facility], [address] [place], [place] [address]    Report Status 06/21/2023 FINAL  Final  Culture, Respiratory w Gram Stain     Status: None   Collection Time: 06/18/23  8:19 AM   Specimen: Tracheal Aspirate; Respiratory  Result Value Ref Range Status   Specimen Description   Final    TRACHEAL ASPIRATE Performed at [facility], [address] [place], [place] [address]    Special Requests   Final    NONE Performed at [facility], [address], [place], [place] [address]    Gram Stain   Final    FEW WBC PRESENT, PREDOMINANTLY PMN RARE YEAST WITH PSEUDOHYPHAE Performed at [facility], [address], [place], [place] [address]    Culture FEW METHICILLIN RESISTANT STAPHYLOCOCCUS AUREUS  Final   Report Status 06/22/2023 FINAL  Final   Organism ID, Bacteria METHICILLIN RESISTANT STAPHYLOCOCCUS AUREUS  Final      Susceptibility   Methicillin resistant staphylococcus aureus - MIC*    CIPROFLOXACIN >=8 RESISTANT Resistant      ERYTHROMYCIN >=8 RESISTANT Resistant     GENTAMICIN <=0.5 SENSITIVE Sensitive     OXACILLIN >=4 RESISTANT Resistant     TETRACYCLINE <=1 SENSITIVE Sensitive     VANCOMYCIN 1 SENSITIVE Sensitive     TRIMETH/SULFA 80 RESISTANT Resistant     CLINDAMYCIN <=0.25 SENSITIVE Sensitive     RIFAMPIN <=0.5 SENSITIVE Sensitive     Inducible Clindamycin NEGATIVE Sensitive     LINEZOLID 2 SENSITIVE Sensitive     * FEW METHICILLIN RESISTANT STAPHYLOCOCCUS AUREUS  MIC (1 Drug)-blood culture; 06/13/2023; BLOOD; MRSA; Daptomycin; Patient immune status: Normal     Status: Abnormal   Collection Time: 06/20/23 12:56 PM   Specimen: BLOOD  Result Value Ref Range Status   Min Inhibitory Conc (1 Drug) Final report (A)  Corrected    Comment: (NOTE) Performed At: [facility] [address] [place], [place] [id number] Jolene Schimke MD ON:[id number] CORRECTED ON 03/19 AT 1235: PREVIOUSLY REPORTED AS Preliminary report    Source BLOOD  Final    Comment: Performed at [facility], [address], [place], [place] [address]  MIC Result     Status: Abnormal   Collection Time: 06/20/23 12:56 PM  Result Value Ref Range Status   Result 1 (MIC) Comment (A)  Final    Comment: (NOTE) Methicillin - resistant Staphylococcus aureus Identification performed by account, not confirmed by this laboratory. Testing performed by broth microdilution. DAPTOMYCIN  <=0.25  SUSCEPTIBLE Performed At: [facility] [address] [place], [place] [id number] Jolene Schimke MD IH:[id number]   Culture, Respiratory w Gram Stain     Status: None   Collection Time: 06/22/23  8:31 AM   Specimen: Tracheal Aspirate; Respiratory  Result Value Ref Range Status   Specimen Description   Final    TRACHEAL ASPIRATE Performed at [facility], [address] [place], [place] [address]    Special Requests   Final    NONE Performed at [facility], [address], [place],  [place] [address]    Gram Stain   Final    RARE WBC PRESENT, PREDOMINANTLY PMN FEW GRAM POSITIVE COCCI FEW GRAM NEGATIVE RODS Performed at [facility], [address], [place],  [place] [address]    Culture   Final    ABUNDANT PSEUDOMONAS AERUGINOSA ABUNDANT METHICILLIN RESISTANT STAPHYLOCOCCUS AUREUS    Report Status 06/24/2023 FINAL  Final   Organism ID, Bacteria PSEUDOMONAS AERUGINOSA  Final   Organism ID, Bacteria METHICILLIN RESISTANT STAPHYLOCOCCUS AUREUS  Final      Susceptibility   Methicillin resistant staphylococcus aureus - MIC*    CIPROFLOXACIN >=8 RESISTANT Resistant     ERYTHROMYCIN >=8 RESISTANT Resistant     GENTAMICIN <=0.5 SENSITIVE Sensitive     OXACILLIN >=4 RESISTANT Resistant     TETRACYCLINE <=1 SENSITIVE Sensitive     VANCOMYCIN 1 SENSITIVE Sensitive     TRIMETH/SULFA 160 RESISTANT Resistant     CLINDAMYCIN <=0.25 SENSITIVE Sensitive     RIFAMPIN <=0.5 SENSITIVE Sensitive     Inducible Clindamycin NEGATIVE Sensitive     LINEZOLID 2 SENSITIVE Sensitive     * ABUNDANT METHICILLIN RESISTANT STAPHYLOCOCCUS AUREUS   Pseudomonas aeruginosa - MIC*    CEFTAZIDIME 4 SENSITIVE Sensitive     CIPROFLOXACIN <=0.25 SENSITIVE Sensitive     GENTAMICIN 4 SENSITIVE Sensitive     IMIPENEM 2 SENSITIVE Sensitive     PIP/TAZO 16 SENSITIVE Sensitive ug/mL    CEFEPIME 2 SENSITIVE Sensitive     * ABUNDANT PSEUDOMONAS AERUGINOSA    Studies/Results: No results found.    Assessment/Plan:  INTERVAL HISTORY: patient remains intubated  Principal Problem:   MRSA bacteremia Active Problems:   Severe sepsis (HCC)   Septic embolism (HCC)   Malnutrition of moderate degree   Respiratory failure (HCC)   Diskitis   IVDU (intravenous drug user)    Brendan Preston is a 48 y.o. 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 with hypoxic respiratory failure still on the ventilator also being treated for ventilator associate  pneumonia due to Pseudomonas aeruginosa  #1 MRSA bacteremia TV endocarditis, L spine infection  Continue vancomycin  #2 VAP: Will complete 7 days of ciprofloxacin  #3 TDM: creatinine stable Lab Results  Component Value Date   CREATININE 1.07 06/27/2023   CREATININE 0.88 06/26/2023   CREATININE 1.00 06/25/2023    IVDU; need plan for long term treatment  CRITICAL CARE Performed by: Paulette Blanch [place]   Total critical care time: 30 minutes  Critical care time was exclusive of separately billable procedures and treating other patients.  Critical care was necessary to treat or prevent imminent or life-threatening deterioration.  Critical care was time spent personally by me on the following activities: development of treatment plan with patient and/or surrogate as well as nursing, discussions with consultants, evaluation of patient's response to treatment, examination of patient, obtaining history from patient or surrogate, ordering and performing treatments and interventions, ordering and review of laboratory studies, ordering and review of radiographic studies, pulse oximetry and re-evaluation of patient's condition. .  Evaluation of the patient requires complex antimicrobial therapy evaluation, counseling , isolation needs to reduce disease transmission and risk assessment and mitigation.   I am available for questions this weekend and Dr. Renold Don will take over the service on Monday.   LOS: 14 days   Brendan Preston 06/27/2023, 4:19 PM

## 2023-06-27 NOTE — Progress Notes (Addendum)
 Subjective: Intubated but alert and oriented   Antibiotics:  Anti-infectives (From admission, onward)    Start     Dose/Rate Route Frequency Ordered Stop   06/25/23 1400  ciprofloxacin (CIPRO) IVPB 400 mg        400 mg 200 mL/hr over 60 Minutes Intravenous Every 8 hours 06/25/23 0748     06/23/23 2200  meropenem (MERREM) 1 g in sodium chloride 0.9 % 100 mL IVPB  Status:  Discontinued        1 g 200 mL/hr over 30 Minutes Intravenous Every 8 hours 06/23/23 1108 06/25/23 0748   06/23/23 1130  meropenem (MERREM) 1 g in sodium chloride 0.9 % 100 mL IVPB        1 g 200 mL/hr over 30 Minutes Intravenous  Once 06/23/23 1057 06/23/23 1140   06/17/23 1000  vancomycin (VANCOREADY) IVPB 1250 mg/250 mL  Status:  Discontinued        1,250 mg 166.7 mL/hr over 90 Minutes Intravenous Every 24 hours 06/17/23 0709 06/17/23 0818   06/17/23 0900  vancomycin (VANCOREADY) IVPB 750 mg/150 mL        750 mg 150 mL/hr over 60 Minutes Intravenous Every 12 hours 06/17/23 0818     06/16/23 1030  vancomycin (VANCOCIN) IVPB 1000 mg/200 mL premix  Status:  Discontinued        1,000 mg 200 mL/hr over 60 Minutes Intravenous Every 24 hours 06/16/23 1022 06/17/23 0709   06/15/23 1345  cefTRIAXone (ROCEPHIN) 2 g in sodium chloride 0.9 % 100 mL IVPB  Status:  Discontinued        2 g 200 mL/hr over 30 Minutes Intravenous Every 24 hours 06/15/23 1249 06/15/23 2226   06/14/23 1000  vancomycin (VANCOREADY) IVPB 750 mg/150 mL  Status:  Discontinued        750 mg 150 mL/hr over 60 Minutes Intravenous Every 24 hours 06/14/23 0852 06/16/23 1022   06/13/23 1940  cefTRIAXone (ROCEPHIN) 2 g in sodium chloride 0.9 % 100 mL IVPB  Status:  Discontinued        2 g 200 mL/hr over 30 Minutes Intravenous 2 times daily 06/13/23 1940 06/14/23 0802   06/13/23 1830  cefTRIAXone (ROCEPHIN) 2 g in sodium chloride 0.9 % 100 mL IVPB  Status:  Discontinued        2 g 200 mL/hr over 30 Minutes Intravenous Every 24 hours 06/13/23  1803 06/13/23 1940   06/13/23 1445  vancomycin (VANCOCIN) 500 mg in sodium chloride 0.9 % 100 mL IVPB        500 mg 100 mL/hr over 60 Minutes Intravenous  Once 06/13/23 1430 06/13/23 1627   06/13/23 1430  vancomycin (VANCOREADY) IVPB 500 mg/100 mL  Status:  Discontinued        500 mg 100 mL/hr over 60 Minutes Intravenous  Once 06/13/23 1426 06/13/23 1430   06/13/23 1400  vancomycin (VANCOCIN) IVPB 1000 mg/200 mL premix        1,000 mg 200 mL/hr over 60 Minutes Intravenous  Once 06/13/23 1354 06/13/23 1527   06/13/23 1345  ceFEPIme (MAXIPIME) 2 g in sodium chloride 0.9 % 100 mL IVPB        2 g 200 mL/hr over 30 Minutes Intravenous  Once 06/13/23 1335 06/13/23 1449       Medications: Scheduled Meds:  Chlorhexidine Gluconate Cloth  6 each Topical Daily   clonazePAM  1 mg Per Tube BID   enoxaparin (LOVENOX) injection  70  mg Subcutaneous Q12H   feeding supplement (PROSource TF20)  60 mL Per Tube Daily   free water  100 mL Per Tube Q8H   guaiFENesin  20 mL Per Tube BID   insulin aspart  0-9 Units Subcutaneous Q4H   lidocaine  1 patch Transdermal Q24H   mouth rinse  15 mL Mouth Rinse Q2H   oxyCODONE  10 mg Per Tube Q4H   pantoprazole (PROTONIX) IV  40 mg Intravenous Q24H   polyethylene glycol  17 g Per Tube Daily   senna-docusate  2 tablet Per Tube BID   sodium chloride flush  10-40 mL Intracatheter Q12H   sodium chloride flush  3-10 mL Intravenous Q12H   thiamine (VITAMIN B1) injection  100 mg Intravenous Daily   Continuous Infusions:  ciprofloxacin Stopped (06/27/23 0748)   dexmedetomidine (PRECEDEX) IV infusion 1 mcg/kg/hr (06/27/23 0817)   feeding supplement (VITAL 1.5 CAL) 55 mL/hr at 06/27/23 0817   HYDROmorphone 0.5 mg/hr (06/27/23 0817)   vancomycin Stopped (06/26/23 2300)   PRN Meds:.acetaminophen (TYLENOL) oral liquid 160 mg/5 mL, acetaminophen, albuterol, bisacodyl, ibuprofen, methocarbamol, metoprolol tartrate, midazolam, mouth rinse, mouth rinse, sodium chloride  flush, sodium chloride flush    Objective: Weight change:   Intake/Output Summary (Last 24 hours) at 06/27/2023 0826 Last data filed at 06/27/2023 0817 Gross per 24 hour  Intake 3314.5 ml  Output 5860 ml  Net -2545.5 ml   Blood pressure 107/64, pulse 71, temperature 100.1 F (37.8 C), temperature source Bladder, resp. rate 19, height 5\' 11"  (1.803 m), weight 68.6 kg, SpO2 (!) 88%. Temp:  [98.3 F (36.8 C)-102 F (38.9 C)] 100.1 F (37.8 C) (03/21 0453) Pulse Rate:  [69-101] 71 (03/21 0600) Resp:  [16-30] 19 (03/21 0600) BP: (81-166)/(51-94) 107/64 (03/21 0600) SpO2:  [88 %-100 %] 88 % (03/21 0600) FiO2 (%):  [30 %] 30 % (03/21 0331)  Physical Exam: Physical Exam Constitutional:      Appearance: Brendan Preston is ill-appearing.     Interventions: Brendan Preston is intubated.  HENT:     Head: Normocephalic and atraumatic.  Eyes:     Extraocular Movements: Extraocular movements intact.  Pulmonary:     Effort: Brendan Preston is intubated.     Breath sounds: No wheezing.  Abdominal:     General: There is no distension.  Skin:    General: Skin is warm and dry.  Neurological:     General: No focal deficit present.     Mental Status: Brendan Preston is alert and oriented to person, place, and time.  Psychiatric:        Behavior: Behavior normal.        Judgment: Judgment normal.      CBC:    BMET Recent Labs    06/26/23 0548 06/27/23 0058  NA 140 137  K 3.6 3.7  CL 103 99  CO2 30 30  GLUCOSE 114* 163*  BUN 21* 23*  CREATININE 0.88 1.07  CALCIUM 7.4* 7.3*     Liver Panel  Recent Labs    06/25/23 0615  PROT 6.0*  ALBUMIN <1.5*  AST 26  ALT 27  ALKPHOS 63  BILITOT 0.5       Sedimentation Rate No results for input(s): "ESRSEDRATE" in the last 72 hours. C-Reactive Protein No results for input(s): "CRP" in the last 72 hours.  Micro Results: Recent Results (from the past 720 hours)  Culture, blood (Routine X 2) w Reflex to ID Panel     Status: Abnormal (Preliminary result)   Collection  Time: 06/13/23  1:49 PM   Specimen: BLOOD  Result Value Ref Range Status   Specimen Description   Final    BLOOD SITE NOT SPECIFIED Performed at [facility], [address], [place], [place] [address]    Special Requests   Final    BOTTLES DRAWN AEROBIC AND ANAEROBIC Blood Culture adequate volume Performed at [facility], [address], [place], [place] [address]    Culture  Setup Time   Final    GRAM POSITIVE COCCI IN CLUSTERS IN BOTH AEROBIC AND ANAEROBIC BOTTLES PHARMD E JACKSON 06/14/2023 @ 0551 BY AB    Culture (A)  Final    METHICILLIN RESISTANT STAPHYLOCOCCUS AUREUS Sent to [facility] for further susceptibility testing. Performed at [facility], [address], [place], [place] [address]    Report Status PENDING  Incomplete   Organism ID, Bacteria METHICILLIN RESISTANT STAPHYLOCOCCUS AUREUS  Final      Susceptibility   Methicillin resistant staphylococcus aureus - MIC*    CIPROFLOXACIN >=8 RESISTANT Resistant     ERYTHROMYCIN >=8 RESISTANT Resistant     GENTAMICIN <=0.5 SENSITIVE Sensitive     OXACILLIN >=4 RESISTANT Resistant     TETRACYCLINE <=1 SENSITIVE Sensitive     VANCOMYCIN 1 SENSITIVE Sensitive     TRIMETH/SULFA >=320 RESISTANT Resistant     CLINDAMYCIN <=0.25 SENSITIVE Sensitive     RIFAMPIN <=0.5 SENSITIVE Sensitive     Inducible Clindamycin NEGATIVE Sensitive     LINEZOLID 2 SENSITIVE Sensitive     * METHICILLIN RESISTANT STAPHYLOCOCCUS AUREUS  Blood Culture ID Panel (Reflexed)     Status: Abnormal   Collection Time: 06/13/23  1:49 PM  Result Value Ref Range Status   Enterococcus faecalis NOT DETECTED NOT DETECTED Final   Enterococcus Faecium NOT DETECTED NOT DETECTED Final   Listeria monocytogenes NOT DETECTED NOT DETECTED Final   Staphylococcus species DETECTED (A) NOT DETECTED Final    Comment: CRITICAL RESULT CALLED TO, READ BACK BY AND VERIFIED WITH: PHARMD E JACKSON 06/14/2023 @ 0551 BY AB     Staphylococcus aureus (BCID) DETECTED (A) NOT DETECTED Final    Comment: Methicillin (oxacillin)-resistant Staphylococcus aureus (MRSA). MRSA is predictably resistant to beta-lactam antibiotics (except ceftaroline). Preferred therapy is vancomycin unless clinically contraindicated. Brendan Preston requires contact precautions if  hospitalized. CRITICAL RESULT CALLED TO, READ BACK BY AND VERIFIED WITH: PHARMD E JACKSON 06/14/2023 @ 0551 BY AB    Staphylococcus epidermidis NOT DETECTED NOT DETECTED Final   Staphylococcus lugdunensis NOT DETECTED NOT DETECTED Final   Streptococcus species NOT DETECTED NOT DETECTED Final   Streptococcus agalactiae NOT DETECTED NOT DETECTED Final   Streptococcus pneumoniae NOT DETECTED NOT DETECTED Final   Streptococcus pyogenes NOT DETECTED NOT DETECTED Final   A.calcoaceticus-baumannii NOT DETECTED NOT DETECTED Final   Bacteroides fragilis NOT DETECTED NOT DETECTED Final   Enterobacterales NOT DETECTED NOT DETECTED Final   Enterobacter cloacae complex NOT DETECTED NOT DETECTED Final   Escherichia coli NOT DETECTED NOT DETECTED Final   Klebsiella aerogenes NOT DETECTED NOT DETECTED Final   Klebsiella oxytoca NOT DETECTED NOT DETECTED Final   Klebsiella pneumoniae NOT DETECTED NOT DETECTED Final   Proteus species NOT DETECTED NOT DETECTED Final   Salmonella species NOT DETECTED NOT DETECTED Final   Serratia marcescens NOT DETECTED NOT DETECTED Final   Haemophilus influenzae NOT DETECTED NOT DETECTED Final   Neisseria meningitidis NOT DETECTED NOT DETECTED Final   Pseudomonas aeruginosa NOT DETECTED NOT DETECTED Final   Stenotrophomonas maltophilia  NOT DETECTED NOT DETECTED Final   Candida albicans NOT DETECTED NOT DETECTED Final   Candida auris NOT DETECTED NOT DETECTED Final   Candida glabrata NOT DETECTED NOT DETECTED Final   Candida krusei NOT DETECTED NOT DETECTED Final   Candida parapsilosis NOT DETECTED NOT DETECTED Final   Candida tropicalis NOT DETECTED NOT  DETECTED Final   Cryptococcus neoformans/gattii NOT DETECTED NOT DETECTED Final   Meth resistant mecA/C and MREJ DETECTED (A) NOT DETECTED Final    Comment: CRITICAL RESULT CALLED TO, READ BACK BY AND VERIFIED WITH: PHARMD E JACKSON 06/14/2023 @ 0551 BY AB Performed at [facility], [address] [place], [place] [address]   Blood culture (routine x 2)     Status: Abnormal   Collection Time: 06/13/23  1:55 PM   Specimen: BLOOD RIGHT ARM  Result Value Ref Range Status   Specimen Description   Final    BLOOD RIGHT ARM Performed at [facility], [address] [place], [place] [address]    Special Requests   Final    BOTTLES DRAWN AEROBIC AND ANAEROBIC Blood Culture results may not be optimal due to an inadequate volume of blood received in culture bottles Performed at [facility], [address] [place], [place] [address]    Culture  Setup Time   Final    GRAM POSITIVE COCCI IN CLUSTERS IN BOTH AEROBIC AND ANAEROBIC BOTTLES CRITICAL VALUE NOTED.  VALUE IS CONSISTENT WITH PREVIOUSLY REPORTED AND CALLED VALUE.    Culture (A)  Final    STAPHYLOCOCCUS AUREUS SUSCEPTIBILITIES PERFORMED ON PREVIOUS CULTURE WITHIN THE LAST 5 DAYS. Performed at [facility], [address] [place], [place] [address]    Report Status 06/17/2023 FINAL  Final  Resp panel by RT-PCR (RSV, Flu A&B, Covid) Anterior Nasal Swab     Status: None   Collection Time: 06/13/23  1:56 PM   Specimen: Anterior Nasal Swab  Result Value Ref Range Status   SARS Coronavirus 2 by RT PCR NEGATIVE NEGATIVE Final    Comment: (NOTE) SARS-CoV-2 target nucleic acids are NOT DETECTED.  The SARS-CoV-2 RNA is generally detectable in upper respiratory specimens during the acute phase of infection. The lowest concentration of SARS-CoV-2 viral copies this assay can detect is 138 copies/mL. A negative result does not preclude SARS-Cov-2 infection and should not be used as the sole basis for  treatment or other Brendan Preston management decisions. A negative result may occur with  improper specimen collection/handling, submission of specimen other than nasopharyngeal swab, presence of viral mutation(s) within the areas targeted by this assay, and inadequate number of viral copies(<138 copies/mL). A negative result must be combined with clinical observations, Brendan Preston history, and epidemiological information. The expected result is Negative.  Fact Sheet for Patients:  BloggerCourse.com  Fact Sheet for Healthcare Providers:  SeriousBroker.it  This test is no t yet approved or cleared by the [place] FDA and  has been authorized for detection and/or diagnosis of SARS-CoV-2 by FDA under an Emergency Use Authorization (EUA). This EUA will remain  in effect (meaning this test can be used) for the duration of the COVID-19 declaration under Section 564(b)(1) of the Act, 21 U.S.C.section 360bbb-3(b)(1), unless the authorization is terminated  or revoked sooner.       Influenza A by PCR NEGATIVE NEGATIVE Final   Influenza B by PCR NEGATIVE NEGATIVE Final    Comment: (NOTE) The Xpert Xpress SARS-CoV-2/FLU/RSV plus assay is intended as an aid in  the diagnosis of influenza from Nasopharyngeal swab specimens and should not be used as a sole basis for treatment. Nasal washings and aspirates are unacceptable for Xpert Xpress SARS-CoV-2/FLU/RSV testing.  Fact Sheet for Patients: BloggerCourse.com  Fact Sheet for Healthcare Providers: SeriousBroker.it  This test is not yet approved or cleared by the [place] FDA and has been authorized for detection and/or diagnosis of SARS-CoV-2 by FDA under an Emergency Use Authorization (EUA). This EUA will remain in effect (meaning this test can be used) for the duration of the COVID-19 declaration under Section 564(b)(1) of the Act, 21  U.S.C. section 360bbb-3(b)(1), unless the authorization is terminated or revoked.     Resp Syncytial Virus by PCR NEGATIVE NEGATIVE Final    Comment: (NOTE) Fact Sheet for Patients: BloggerCourse.com  Fact Sheet for Healthcare Providers: SeriousBroker.it  This test is not yet approved or cleared by the [place] FDA and has been authorized for detection and/or diagnosis of SARS-CoV-2 by FDA under an Emergency Use Authorization (EUA). This EUA will remain in effect (meaning this test can be used) for the duration of the COVID-19 declaration under Section 564(b)(1) of the Act, 21 U.S.C. section 360bbb-3(b)(1), unless the authorization is terminated or revoked.  Performed at [facility], [address] [place], [place] [address]   Urine Culture     Status: None   Collection Time: 06/13/23  2:27 PM   Specimen: Urine, Clean Catch  Result Value Ref Range Status   Specimen Description   Final    URINE, CLEAN CATCH Performed at [facility] [address], [place], [place] [address]    Special Requests   Final    NONE Performed at [facility], [address], [place], [place] [address]    Culture   Final    NO GROWTH Performed at [facility], [address], [place], [place] [address]    Report Status 06/14/2023 FINAL  Final  Culture, blood (Routine X 2) w Reflex to ID Panel     Status: None   Collection Time: 06/15/23 10:27 PM   Specimen: BLOOD RIGHT HAND  Result Value Ref Range Status   Specimen Description   Final    BLOOD RIGHT HAND Performed at [facility], [address], [place], [place] [address]    Special Requests   Final    BOTTLES DRAWN AEROBIC ONLY Blood Culture results may not be optimal due to an inadequate volume of blood received in culture bottles Performed at [facility], [address], [place], [place]  [address]    Culture   Final    NO GROWTH 5 DAYS Performed at [facility], [address], [place], [place] [address]    Report Status 06/21/2023 FINAL  Final  Culture, blood (Routine X 2) w Reflex to ID Panel     Status: None   Collection Time: 06/15/23 10:27 PM   Specimen: BLOOD RIGHT HAND  Result Value Ref Range Status   Specimen Description   Final    BLOOD RIGHT HAND Performed at [facility], [address] [place], [place] [address]    Special Requests   Final    BOTTLES DRAWN AEROBIC ONLY Blood Culture results may not be optimal due to an inadequate volume of blood received in culture bottles Performed at [facility], [address], [place], [place] [address]    Culture   Final  NO GROWTH 5 DAYS Performed at [facility], [address] [place], [place] [address]    Report Status 06/21/2023 FINAL  Final  Culture, Respiratory w Gram Stain     Status: None   Collection Time: 06/18/23  8:19 AM   Specimen: Tracheal Aspirate; Respiratory  Result Value Ref Range Status   Specimen Description   Final    TRACHEAL ASPIRATE Performed at [facility], [address] [place], [place] [address]    Special Requests   Final    NONE Performed at [facility], [address], [place], [place] [address]    Gram Stain   Final    FEW WBC PRESENT, PREDOMINANTLY PMN RARE YEAST WITH PSEUDOHYPHAE Performed at [facility], [address], [place], [place] [address]    Culture FEW METHICILLIN RESISTANT STAPHYLOCOCCUS AUREUS  Final   Report Status 06/22/2023 FINAL  Final   Organism ID, Bacteria METHICILLIN RESISTANT STAPHYLOCOCCUS AUREUS  Final      Susceptibility   Methicillin resistant staphylococcus aureus - MIC*    CIPROFLOXACIN >=8 RESISTANT Resistant     ERYTHROMYCIN >=8 RESISTANT Resistant     GENTAMICIN <=0.5 SENSITIVE Sensitive     OXACILLIN >=4 RESISTANT Resistant     TETRACYCLINE <=1  SENSITIVE Sensitive     VANCOMYCIN 1 SENSITIVE Sensitive     TRIMETH/SULFA 80 RESISTANT Resistant     CLINDAMYCIN <=0.25 SENSITIVE Sensitive     RIFAMPIN <=0.5 SENSITIVE Sensitive     Inducible Clindamycin NEGATIVE Sensitive     LINEZOLID 2 SENSITIVE Sensitive     * FEW METHICILLIN RESISTANT STAPHYLOCOCCUS AUREUS  MIC (1 Drug)-blood culture; 06/13/2023; BLOOD; MRSA; Daptomycin; Brendan Preston immune status: Normal     Status: Abnormal   Collection Time: 06/20/23 12:56 PM   Specimen: BLOOD  Result Value Ref Range Status   Min Inhibitory Conc (1 Drug) Final report (A)  Corrected    Comment: (NOTE) Performed At: [facility] [address] [place], [place] [id number] Jolene Schimke MD ZD:[id number] CORRECTED ON 03/19 AT 1235: PREVIOUSLY REPORTED AS Preliminary report    Source BLOOD  Final    Comment: Performed at [facility], [address], [place], [place] [address]  MIC Result     Status: Abnormal   Collection Time: 06/20/23 12:56 PM  Result Value Ref Range Status   Result 1 (MIC) Comment (A)  Final    Comment: (NOTE) Methicillin - resistant Staphylococcus aureus Identification performed by account, not confirmed by this laboratory. Testing performed by broth microdilution. DAPTOMYCIN  <=0.25  SUSCEPTIBLE Performed At: [facility] [address] [place], [place] [id number] Jolene Schimke MD SW:[id number]   Culture, Respiratory w Gram Stain     Status: None   Collection Time: 06/22/23  8:31 AM   Specimen: Tracheal Aspirate; Respiratory  Result Value Ref Range Status   Specimen Description   Final    TRACHEAL ASPIRATE Performed at [facility], [address], [place], [place] [address]    Special Requests   Final    NONE Performed at [facility], [address], [place], [place] [address]    Gram Stain   Final    RARE WBC PRESENT, PREDOMINANTLY PMN FEW GRAM POSITIVE COCCI FEW GRAM NEGATIVE RODS Performed at [facility], [address] [place], [place] [address]    Culture   Final    ABUNDANT PSEUDOMONAS AERUGINOSA ABUNDANT METHICILLIN RESISTANT STAPHYLOCOCCUS AUREUS  Report Status 06/24/2023 FINAL  Final   Organism ID, Bacteria PSEUDOMONAS AERUGINOSA  Final   Organism ID, Bacteria METHICILLIN RESISTANT STAPHYLOCOCCUS AUREUS  Final      Susceptibility   Methicillin resistant staphylococcus aureus - MIC*    CIPROFLOXACIN >=8 RESISTANT Resistant     ERYTHROMYCIN >=8 RESISTANT Resistant     GENTAMICIN <=0.5 SENSITIVE Sensitive     OXACILLIN >=4 RESISTANT Resistant     TETRACYCLINE <=1 SENSITIVE Sensitive     VANCOMYCIN 1 SENSITIVE Sensitive     TRIMETH/SULFA 160 RESISTANT Resistant     CLINDAMYCIN <=0.25 SENSITIVE Sensitive     RIFAMPIN <=0.5 SENSITIVE Sensitive     Inducible Clindamycin NEGATIVE Sensitive     LINEZOLID 2 SENSITIVE Sensitive     * ABUNDANT METHICILLIN RESISTANT STAPHYLOCOCCUS AUREUS   Pseudomonas aeruginosa - MIC*    CEFTAZIDIME 4 SENSITIVE Sensitive     CIPROFLOXACIN <=0.25 SENSITIVE Sensitive     GENTAMICIN 4 SENSITIVE Sensitive     IMIPENEM 2 SENSITIVE Sensitive     PIP/TAZO 16 SENSITIVE Sensitive ug/mL    CEFEPIME 2 SENSITIVE Sensitive     * ABUNDANT PSEUDOMONAS AERUGINOSA    Studies/Results: No results found.    Assessment/Plan:  INTERVAL HISTORY: Brendan Preston at times   Principal Problem:   Severe sepsis (HCC) Active Problems:   MRSA bacteremia   Septic embolism (HCC)   Malnutrition of moderate degree    Brendan Preston is a 48 y.o. 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  Critical care was necessary to treat or prevent imminent or life-threatening deterioration.  Critical care was time spent personally by me on the following activities: development of treatment plan with Brendan Preston and/or surrogate as well as nursing, discussions with consultants, evaluation of Brendan Preston's response to treatment, examination of Brendan Preston, obtaining history from Brendan Preston or surrogate, ordering and performing treatments and interventions, ordering and review of laboratory studies, ordering and review of radiographic studies, pulse oximetry and re-evaluation of Brendan Preston's condition.  Evaluation of the Brendan Preston requires complex antimicrobial therapy evaluation, counseling , isolation needs to reduce disease transmission and risk assessment and mitigation.     LOS: 14 days   Acey Lav 06/27/2023, 8:26 AM

## 2023-06-27 NOTE — Progress Notes (Signed)
 NAME:  Brendan Preston, MRN:  [id number], DOB:  1975-11-18, LOS: 14 ADMISSION DATE:  06/13/2023, CONSULTATION DATE:  06/13/23 REFERRING MD:  Dr. Doran Durand, CHIEF COMPLAINT:  AMS   History of Present Illness:  Pt encephalopathic, therefore HPI obtained from chart review.  See additional chart w/MRN [id number]  37 yoM with prior hx of known polysubstance (heroin/ fentanyl) abuse in which EMS was called out for AMS, last seen awake 3 days ago covered in urine and feces.   In ER, temp 104.3 rectal, tachycardic, normo to slightly hypertensive, sats 100% on NRB> but maintaining sats on RA with GCS ~10 but protecting airway.  Labs significant for lactic 8.4, WBC 15, plts 96, VBG 7.29/ 44,  CXR neg, RVP neg, EKG without ST changes, qtc borderline, CTH official read pending, trop hs 694, INR 1.3.  Foley placed- urine cloudy, glucose 150, Hgb 150 (-RBC), neg leuks/nitrates, WBC 6-10, UDS positive for THC and opiates.  CMET and other labs had to be redrawn and still awaiting due to hemolysis.  Cultures sent, empiric vanc/ cefepime given, 2L LR.  PCCM called for admit.  Pertinent  Medical History  Tobacco abuse, polysubstance abuse/ IVDA, ETOH unclear  Significant Hospital Events: Including procedures, antibiotic start and stop dates in addition to other pertinent events   3/7 admit, encephalopathic, altered, drug rash developed after cefepime 3/8 TTE negative for endocarditis 3/10 he had a TEE scheduled but sounds like he has declined this . Larey Seat out of bed late day shift and broke his nose. Bcx still pos. 3/11 Tubed in afternoon. CRP high.  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 Still having marked trach secretions. Growing PA so added meropenem (given his rash w/ cefepime). Changed fent to dilaudid at 2mg /hr. CT clamped.  3/18 hgb down to 6.6 got 1 unit PRBC. No obvious bleeding source. Was >13 liters positive so also starting diuretics. Dec'd dilaudid dose to 1.5mg /hr. no PTX so CT removed.  3/19 tolerated 7 hours PSV 10/5 but had quite a lot of secretions requiring frequent suctioning  Interim History / Subjective:  Temp 99.5. On 30% FiO2. Diuresed well overnight, 5L past 24 hours but still +8L net since admit.  Objective   Blood pressure 107/64, pulse 71, temperature 99.5 F (37.5 C), temperature source Rectal, resp. rate 19, height 5\' 11"  (1.803 m), weight 68.6 kg, SpO2 (!) 88%.    Vent Mode: CPAP;PSV FiO2 (%):  [30 %] 30 % Set Rate:  [15 bmp-16 bmp] 16 bmp Vt Set:  [600 mL] 600 mL PEEP:  [5 cmH20] 5 cmH20 Pressure Support:  [5 cmH20] 5 cmH20 Plateau Pressure:  [17 cmH20-25 cmH20] 17 cmH20   Intake/Output Summary (Last 24 hours) at 06/27/2023 0933 Last data filed at 06/27/2023 5366 Gross per 24 hour  Intake 3094.48 ml  Output 5860 ml  Net -2765.52 ml   Filed Weights   06/21/23 0711 06/25/23 0500 06/26/23 0600  Weight: 67 kg 70.6 kg 68.6 kg    Examination:  General: Adult male, chronically ill appearing, in NAD. Neuro: Sedated but opens eyes to voice, follows intermittent basic commands, writes on paper HEENT: [place]/AT. Sclerae anicteric.  ETT in place.  Cardiovascular: RRR, no M/R/G.  Lungs: Respirations even and unlabored.  Rhonchi bilaterally. Moderately thick secretions suctioned via ETT, continues to require suctioning. Abdomen: BS x 4, soft, NT/ND.  Musculoskeletal: No gross deformities, no edema.  Skin: Intact, warm, no rashes.   Resolved Hospital Problem list   Cefepime drug rash Encephalopathy  AKI  Shock  Pneumothorax. Resolved 3/18 chest tube  removed  Assessment & Plan:   Sepsis with MRSA bacteremia, Cavitary pneumonia MRSA and then pseudomonas complicated by septic emboli and osteo to spine TEE neg for endocarditis. Repeat blood cx from 3/9 no growth. Sputum cx 3/16 with few gpc and few gnr. Currently afebrile. WBC 13.1. ID following Plan Continue Vanc day 14/x. Will need prolonged therapy Continue Cipro day 3/x (changed from Merrem based on sensitivities) Additional recs as per ID   Acute hypoxic respiratory failure due to ARDS with necrotizing MRSA PNA, septic emboli and pseudomonas PNA Left Pneumothorax- s/p chest tube placement 3/14 with subsequent removal 3/18 Plan Cont full vent support w/ daily assessment for readiness to wean and SBT Treating PNA VAP bundle  PAD protocol RASS goal -1 Pulm hygiene (continue HT saline) Continue chest PT Continue guaifenesin per tube Diuresed well, continue Lasix 40mg  BID for now as we will try to optimize his respiratory status and improve secretions in order to hopefully avoid trach If no improvement by Sunday 3/23, will need to plan for trach early next week as his secretions continue to be a burden/barrier to extubation, he is gradually tolerating longer periods of SBT which is encouraging (though he requires frequent suctioning and per nursing staff, he had a very weak cough prior to intubation)  Acute HFrEF (45-50%) Possible PFO  TEE w possible small PFO grade 1 L to R shunt  Plan Consider repeat TTE after acute illness Cont tele  Diuresis as above  Fluid and electrolyte imbalance: Hypomagnesemia s/p repletion Plan Follow BMP  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 Neg LE duplex Plan  Changed to LMWH given heparin levels not able to be measured on 3/17  Polysubstance abuse Opiate dependence Positive for opiates and THC on admission Plan Continue scheduled oxycodone, Klonopin Continue Precedex, wean dose down D/c Dilaudid infusion Rass goal 0/-1 Delirium precautions   Fall,  comminuted nasal bone fx Plan ENT will see him as outpatient when stable  Protein calorie malnutrition, mild Plan Cont tube feeds  Anemia of critical illness He is + 8 liters so ? Some component of dilution  Plan Follow H/H   Best Practice (right click and "Reselect all SmartList Selections" daily)   Diet/type: tubefeeds  DVT prophylaxis LMWH Pressure ulcer(s): N/A GI prophylaxis: PPI Lines: N/A Foley:  Yes, and it is still needed Code Status:  full code Last date of multidisciplinary goals of care discussion-parents updated at bedside  CC time: 35 min     Torri Michalski Celine Mans, PA - [facility] For pager details, please see AMION or use Epic chat  After 1900, please call [facility] for cross coverage needs 06/27/2023, 9:33 AM

## 2023-06-27 NOTE — Plan of Care (Signed)
   Problem: Activity: Goal: Risk for activity intolerance will decrease Outcome: Progressing   Problem: Nutrition: Goal: Adequate nutrition will be maintained Outcome: Progressing   Problem: Coping: Goal: Level of anxiety will decrease Outcome: Progressing

## 2023-06-27 NOTE — Progress Notes (Signed)
 ETT tube advanced from 23 to 25 per CCM order. Patient tolerated fairley well.

## 2023-06-27 NOTE — Progress Notes (Signed)
 Nutrition Follow-up  DOCUMENTATION CODES:   Non-severe (moderate) malnutrition in context of chronic illness  INTERVENTION:  - Increase TF regimen via OGT as below: Vital 1.5 at 60 ml/h (1440 ml per day) Prosource TF20 60 ml daily Provides 2240 kcal, 117 gm protein, 1100 ml free water daily  - FWF per CCM. Currently ordered Q8H which In addition to TF's provides 1425mL/day.   - Monitor weight trends.   NUTRITION DIAGNOSIS:   Moderate Malnutrition related to chronic illness (polysubstance abuse) as evidenced by mild fat depletion, mild muscle depletion. *ongoing  GOAL:   Patient will meet greater than or equal to 90% of their needs *met with TF  MONITOR:   Vent status, Labs, Weight trends, TF tolerance  REASON FOR ASSESSMENT:   Ventilator    ASSESSMENT:   48 yo male with prior hx of known polysubstance (heroin/ fentanyl) abuse admitted for AMS.  3/7 Admit 3/11 Intubated; OGT placed 3/12 Vital 1.5 started @ 20  Patient is currently intubated on ventilator support MV: 15.5 L/min Temp (24hrs), Avg:99.8 F (37.7 C), Min:98.3 F (36.8 C), Max:102 F (38.9 C)  Patient sleeping at time of visit. TF infusing at 48mL/hr. Per RN at bedside, patient tolerating tube feeds.  Will increase tube feeds slightly. Per CCM today, secretions continue to be a barrier to extubation. If no improvement by 3/23, plan for possible trach early next week.    Admit weight: 147# Current weight: 148# I&O's: +8L  Medications reviewed and include: Q8H FWF, Lasix, Miralax, Senokot, 100mg  thiamine Precedex  Labs reviewed:  -   Diet Order:   Diet Order             Diet NPO time specified  Diet effective now                   EDUCATION NEEDS:  Not appropriate for education at this time  Skin:  Skin Assessment: Reviewed RN Assessment  Last BM:  3/20 - rectal tube  Height:  Ht Readings from Last 1 Encounters:  06/26/23 5\' 11"  (1.803 m)   Weight:  Wt  Readings from Last 1 Encounters:  06/27/23 67.3 kg    BMI:  Body mass index is 20.69 kg/m.  Estimated Nutritional Needs:  Kcal:  2000-2350 kcals  Protein:  100-120 grams Fluid:  >/= 2L    Shelle Iron RD, LDN Contact via Secure Chat.

## 2023-06-28 LAB — CBC
HCT: 27.5 % — ABNORMAL LOW (ref 39.0–52.0)
Hemoglobin: 8.7 g/dL — ABNORMAL LOW (ref 13.0–17.0)
MCH: 27.6 pg (ref 26.0–34.0)
MCHC: 31.6 g/dL (ref 30.0–36.0)
MCV: 87.3 fL (ref 80.0–100.0)
Platelets: 370 10*3/uL (ref 150–400)
RBC: 3.15 MIL/uL — ABNORMAL LOW (ref 4.22–5.81)
RDW: 17.2 % — ABNORMAL HIGH (ref 11.5–15.5)
WBC: 9.1 10*3/uL (ref 4.0–10.5)
nRBC: 0 % (ref 0.0–0.2)

## 2023-06-28 LAB — GLUCOSE, CAPILLARY
Glucose-Capillary: 127 mg/dL — ABNORMAL HIGH (ref 70–99)
Glucose-Capillary: 130 mg/dL — ABNORMAL HIGH (ref 70–99)
Glucose-Capillary: 142 mg/dL — ABNORMAL HIGH (ref 70–99)
Glucose-Capillary: 144 mg/dL — ABNORMAL HIGH (ref 70–99)
Glucose-Capillary: 149 mg/dL — ABNORMAL HIGH (ref 70–99)
Glucose-Capillary: 149 mg/dL — ABNORMAL HIGH (ref 70–99)
Glucose-Capillary: 156 mg/dL — ABNORMAL HIGH (ref 70–99)

## 2023-06-28 LAB — BASIC METABOLIC PANEL WITH GFR
Anion gap: 9 (ref 5–15)
BUN: 26 mg/dL — ABNORMAL HIGH (ref 6–20)
CO2: 31 mmol/L (ref 22–32)
Calcium: 7.8 mg/dL — ABNORMAL LOW (ref 8.9–10.3)
Chloride: 99 mmol/L (ref 98–111)
Creatinine, Ser: 1.07 mg/dL (ref 0.61–1.24)
GFR, Estimated: >60 mL/min (ref >60)
Glucose, Bld: 135 mg/dL — ABNORMAL HIGH (ref 70–99)
Potassium: 3.5 mmol/L (ref 3.5–5.1)
Sodium: 139 mmol/L (ref 135–145)

## 2023-06-28 LAB — PHOSPHORUS: Phosphorus: 5.1 mg/dL — ABNORMAL HIGH (ref 2.5–4.6)

## 2023-06-28 LAB — MAGNESIUM: Magnesium: 2 mg/dL (ref 1.7–2.4)

## 2023-06-28 MED ORDER — LIDOCAINE HCL (PF) 2% IJ FOR NEBU
2.5000 mL | RESPIRATORY_TRACT | Status: DC | PRN
  Administered 2023-06-28 – 2023-06-29 (×5): 2.5 mL via RESPIRATORY_TRACT
  Filled 2023-06-28 (×6): qty 5

## 2023-06-28 MED ORDER — CLONAZEPAM 0.5 MG PO TBDP
1.0000 mg | ORAL_TABLET | Freq: Four times a day (QID) | ORAL | Status: DC
  Administered 2023-06-28 – 2023-06-30 (×8): 1 mg
  Filled 2023-06-28 (×8): qty 2

## 2023-06-28 NOTE — Plan of Care (Signed)
  Problem: Education: Goal: Knowledge of General Education information will improve Description: Including pain rating scale, medication(s)/side effects and non-pharmacologic comfort measures Outcome: Progressing   Problem: Clinical Measurements: Goal: Cardiovascular complication will be avoided Outcome: Progressing   Problem: Nutrition: Goal: Adequate nutrition will be maintained Outcome: Progressing   Problem: Elimination: Goal: Will not experience complications related to bowel motility Outcome: Progressing Goal: Will not experience complications related to urinary retention Outcome: Progressing   Problem: Health Behavior/Discharge Planning: Goal: Ability to manage health-related needs will improve Outcome: Not Progressing   Problem: Clinical Measurements: Goal: Will remain free from infection Outcome: Not Progressing Goal: Respiratory complications will improve Outcome: Not Progressing   Problem: Coping: Goal: Level of anxiety will decrease Outcome: Not Progressing   Problem: Pain Managment: Goal: General experience of comfort will improve and/or be controlled Outcome: Not Progressing

## 2023-06-28 NOTE — Progress Notes (Signed)
 NAME:  Brendan Preston, MRN:  [id number], DOB:  1976-01-14, LOS: 15 ADMISSION DATE:  06/13/2023, CONSULTATION DATE:  06/13/23 REFERRING MD:  Dr. Doran Durand, CHIEF COMPLAINT:  AMS   History of Present Illness:  Pt encephalopathic, therefore HPI obtained from chart review.  See additional chart w/MRN [id number]  64 yoM with prior hx of known polysubstance (heroin/ fentanyl) abuse in which EMS was called out for AMS, last seen awake 3 days ago covered in urine and feces.   In ER, temp 104.3 rectal, tachycardic, normo to slightly hypertensive, sats 100% on NRB> but maintaining sats on RA with GCS ~10 but protecting airway.  Labs significant for lactic 8.4, WBC 15, plts 96, VBG 7.29/ 44,  CXR neg, RVP neg, EKG without ST changes, qtc borderline, CTH official read pending, trop hs 694, INR 1.3.  Foley placed- urine cloudy, glucose 150, Hgb 150 (-RBC), neg leuks/nitrates, WBC 6-10, UDS positive for THC and opiates.  CMET and other labs had to be redrawn and still awaiting due to hemolysis.  Cultures sent, empiric vanc/ cefepime given, 2L LR.  PCCM called for admit.  Pertinent  Medical History  Tobacco abuse, polysubstance abuse/ IVDA, ETOH unclear  Significant Hospital Events: Including procedures, antibiotic start and stop dates in addition to other pertinent events   3/7 admit, encephalopathic, altered, drug rash developed after cefepime BLOOD UCLURE MRSA 3/8 TTE negative for endocarditis 3/10 he had a TEE scheduled but sounds like he has declined this . Larey Seat out of bed late day shift and broke his nose. Bcx still pos. 3/11 Tubed in afternoon. CRP high.  3/12 changing his pain reg/ PAD 3/13 on vent 3/14 CT abdomen showed left pneumothorax, bilateral lower lobe consolidation, multiple cavitary lesions, suspicious for bilateral common iliac DVT, fluid collection along L4/L5 vertebral bodies 3/14 left pigtail chest tube for pneumothorax 3/15 started on IV heparin, venous duplex negative, MRI lumbar  spine suggests L5 osteo/no epidural abscess 3/17 changing IV heparin to LMWH as heparin levels not detectable. Still having marked trach secretions. Growing PA so added meropenem (given his rash w/ cefepime). Changed fent to dilaudid at 2mg /hr. CT clamped.  3/18 hgb down to 6.6 got 1 unit PRBC. No obvious bleeding source. Was >13 liters positive so also starting diuretics. Dec'd dilaudid dose to 1.5mg /hr. no PTX so CT removed.  3/19 tolerated 7 hours PSV 10/5 but had quite a lot of secretions requiring frequent suctioning CHES TTUBE REMOVED 3/21 - Temp 99.5. On 30% FiO2. Diuresed well overnight, 5L past 24 hours but still +8L net since admit. 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 On and off low grade fever 101F. On vent 30% fio2/ Per RN and patient - c/p severe cough an donly lidocaine neb helps. C/oc copious resp secretions. PER RN - due to copious secretions patient heading for trach  Objective   Blood pressure (!) 157/94, pulse 89, temperature 99.9 F (37.7 C), temperature source Bladder, resp. rate (!) 22, height 5\' 11"  (1.803 m), weight 65.3 kg, SpO2 95%.    Vent Mode: PRVC FiO2 (%):  [30 %] 30 % Set Rate:  [16 bmp] 16 bmp Vt Set:  [600 mL] 600 mL PEEP:  [5 cmH20] 5  cmH20 Plateau Pressure:  [19 cmH20-26 cmH20] 24 cmH20   Intake/Output Summary (Last 24 hours) at 06/28/2023 0958 Last data filed at 06/28/2023 0904 Gross per 24 hour  Intake 1794.52 ml  Output 7625 ml  Net -5830.48 ml   Filed Weights   06/26/23 0600 06/27/23 0703 06/28/23 0500  Weight: 68.6 kg 67.3 kg 65.3 kg    Examination:  General: Adult male, chronically ill appearing, in NAD. Neuro: Sedated but opens  eyes to voice, follows intermittent basic commands, writes on paper HEENT: [place]/AT. Sclerae anicteric. ETT in place.  Cardiovascular: RRR, no M/R/G.  Lungs: Respirations even and unlabored.  Rhonchi bilaterally. Moderately thick secretions suctioned via ETT, continues to require suctioning. Abdomen: BS x 4, soft, NT/ND.  Musculoskeletal: No gross deformities, no edema.  Skin: Intact, warm, no rashes.   General Appearance:  Looks criticall ill.Marland Kitchen CACHECTIC Head:  Normocephalic, without obvious abnormality, atraumatic Eyes:  PERRL - yes, conjunctiva/corneas - muddy     Ears:  Normal external ear canals, both ears Nose:  G tube - no Throat:  ETT TUBE - yews , OG tube - yes Neck:  Supple,  No enlargement/tenderness/nodules Lungs: Clear to auscultation bilaterally, Ventilator   Synchrony - yes but cough Heart:  S1 and S2 normal, no murmur, CVP - no.  Pressors - no Abdomen:  Soft, no masses, no organomegaly Genitalia / Rectal:  Not done Extremities:  Extremities- intact Skin:  ntact in exposed areas . Sacral area - not examind Neurologic:  Sedation - PRECEDEX  -> RASS - +1 . Moves all 4s - yes. CAM-ICU - neg . Orientation - x3+     Resolved Hospital Problem list   Cefepime drug rash Encephalopathy  AKI  Shock  Pneumothorax. Resolved 3/18 chest tube removed  Assessment & Plan:   Sepsis with MRSA bacteremia, Cavitary pneumonia MRSA and then pseudomonas complicated by septic emboli and osteo to spine TEE neg for endocarditis. Repeat blood cx from 3/9 no growth. Sputum cx 3/16 with few gpc and few gnr. Currently afebrile. WBC 13.1. ID following  3/22 - FEVER 101 On abx  Plan ABx PER ID  Acute hypoxic respiratory failure due to ARDS with necrotizing MRSA PNA, septic emboli and pseudomonas PNA Left Pneumothorax- s/p chest tube placement 3/14 with subsequent removal 3/18  06/28/2023 - > does NOT meet criteria for SBT/Extubation in setting of Acute Respiratory Failure due to deconditining  and secretions   Plan Cont full vent support w/ daily assessment for readiness to wean and SBT Treating PNA VAP bundle  PAD protocol RASS goal -1 Pulm hygiene (continue HT saline) Continue chest PT Continue guaifenesin per tube Diuresed well, continue Lasix 40mg  BID for now as we will try to optimize his respiratory status and improve secretions in order to hopefully avoid trach  If no improvement by Sunday 3/23, will need to plan for trach early next week as his secretions continue to be a burden/barrier to extubation, he is gradually tolerating longer periods of SBT which is encouraging (though he requires frequent suctioning and per nursing staff, he had a very weak cough prior to intubation)  Acute HFrEF (45-50%) Possible PFO  TEE w possible small PFO grade 1 L to R shunt  Plan Consider repeat TTE after acute illness Cont tele  Diuresis as above  Fluid and electrolyte imbalance: Hypomagnesemia s/p repletion Plan Follow BMP  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 Neg LE duplex Plan  Changed to LMWH given heparin levels not able to be measured on 3/17  Polysubstance abuse Opiate dependence Positive for opiates and THC on admission Plan Continue scheduled oxycodone, Klonopin Continue Precedex, wean dose down D/c Dilaudid infusion Rass goal 0/-1 Delirium precautions   Fall, comminuted nasal bone fx Plan ENT will see him as outpatient when stable  Protein calorie malnutrition, mild Plan Cont tube feeds  Anemia of critical illness He is + 8 liters so ? Some component of dilution  Plan Follow H/H   Best Practice (right click and "Reselect all SmartList Selections" daily)   Diet/type: tubefeeds  DVT prophylaxis LMWH Pressure ulcer(s): N/A GI prophylaxis: PPI Lines: N/A Foley:  Yes, and it is still needed Code Status:  full code Last date of multidisciplinary goals of care discussion-parents updated at bedside      ATTESTATION & SIGNATURE   The patient Brendan Preston is critically ill with multiple organ systems failure and requires high complexity decision making for assessment and support, frequent evaluation and titration of therapies, application of advanced monitoring technologies and extensive interpretation of multiple databases and discussion with other appropriate health care personnel such as bedside nurses, social workers, case Production designer, theatre/television/film, consultants, respiratory therapists, nutritionists, secretaries etc.,  Critical care time includes but is not restricted to just documentation time. Documentation can happen in parallel or sequential to care time depending on case mix urgency and priorities for the shift. So, overall critical Care Time devoted to patient care services described in this note is  32  Minutes.   This time reflects time of care of this signee Dr Kalman Shan which includ does not reflect procedure time, or teaching time or supervisory time of PA/NP/Med student/Med Resident etc but could involve care discussion time     Dr. Kalman Shan, M.D., [facility].C.P Pulmonary and Critical Care Medicine Staff Physician, [facility] Pager: [phone], If no answer or between  15:00h - 7:00h: call [phone]  06/28/2023 9:58 AM    LABS    PULMONARY No results for input(s): "PHART", "PCO2ART", "PO2ART", "HCO3", "TCO2", "O2SAT" in the last 168 hours.  Invalid input(s): "PCO2", "PO2"  CBC Recent Labs  Lab 06/26/23 0548 06/26/23 1433 06/27/23 0439 06/28/23 0648  HGB 6.9* 9.6* 8.8* 8.7*  HCT 22.4* 30.4* 27.6* 27.5*  WBC 7.0  --  8.2 9.1  PLT 318  --  337 370    COAGULATION No results for input(s): "INR" in the last 168 hours.  CARDIAC  No results for input(s): "TROPONINI" in the last 168 hours. No results for input(s): "PROBNP" in the last 168 hours.   CHEMISTRY Recent Labs  Lab 06/22/23 0326 06/23/23 0314 06/24/23 0331 06/24/23 1224 06/24/23 1410 06/25/23 0615  06/26/23 0548 06/27/23 0058 06/28/23 0648  NA 143 142   < >  --  143 143 140 137 139  K 3.5 3.4*   < >  --  3.9 3.4* 3.6 3.7 3.5  CL 112* 112*   < >  --  109 108 103 99 99  CO2 25 24   < >  --  27 28 30 30 31   GLUCOSE 157* 202*   < >  --  167* 136* 114* 163* 135*  BUN 22* 22*   < >  --  21* 19 21* 23* 26*  CREATININE 1.10 1.03   < >  --  1.10 1.00 0.88 1.07 1.07  CALCIUM 6.9* 6.7*   < >  --  7.5* 7.4* 7.4* 7.3* 7.8*  MG 1.7  --   --  1.7  --   --  1.6* 1.9 2.0  PHOS 3.2 3.3  --   --   --   --  3.7 4.5 5.1*   < > = values in this interval not displayed.   Estimated Creatinine Clearance: 77.1 mL/min (by C-G formula based on SCr of 1.07 mg/dL).   LIVER Recent Labs  Lab 06/24/23 0331 06/25/23 0615  AST 25 26  ALT 28 27  ALKPHOS 57 63  BILITOT 0.6 0.5  PROT 5.4* 6.0*  ALBUMIN <1.5* <1.5*     INFECTIOUS No results for input(s): "LATICACIDVEN", "PROCALCITON" in the last 168 hours.   ENDOCRINE CBG (last 3)  Recent Labs    06/28/23 0016 06/28/23 0349 06/28/23 0828  GLUCAP 142* 149* 127*         IMAGING x48h  - image(s) personally visualized  -   highlighted in bold No results found.

## 2023-06-28 NOTE — Progress Notes (Signed)
 CPT on hold at this time

## 2023-06-28 NOTE — Progress Notes (Signed)
CPT held at this time due to patient sleeping.  

## 2023-06-29 ENCOUNTER — Other Ambulatory Visit: Payer: Self-pay

## 2023-06-29 ENCOUNTER — Inpatient Hospital Stay (HOSPITAL_COMMUNITY): Payer: MEDICAID

## 2023-06-29 ENCOUNTER — Encounter (HOSPITAL_COMMUNITY): Payer: Self-pay | Admitting: Internal Medicine

## 2023-06-29 LAB — CBC
HCT: 26.8 % — ABNORMAL LOW (ref 39.0–52.0)
Hemoglobin: 8.3 g/dL — ABNORMAL LOW (ref 13.0–17.0)
MCH: 26.9 pg (ref 26.0–34.0)
MCHC: 31 g/dL (ref 30.0–36.0)
MCV: 87 fL (ref 80.0–100.0)
Platelets: 391 10*3/uL (ref 150–400)
RBC: 3.08 MIL/uL — ABNORMAL LOW (ref 4.22–5.81)
RDW: 17.3 % — ABNORMAL HIGH (ref 11.5–15.5)
WBC: 10.6 10*3/uL — ABNORMAL HIGH (ref 4.0–10.5)
nRBC: 0 % (ref 0.0–0.2)

## 2023-06-29 LAB — GLUCOSE, CAPILLARY
Glucose-Capillary: 149 mg/dL — ABNORMAL HIGH (ref 70–99)
Glucose-Capillary: 139 mg/dL — ABNORMAL HIGH (ref 70–99)
Glucose-Capillary: 113 mg/dL — ABNORMAL HIGH (ref 70–99)
Glucose-Capillary: 137 mg/dL — ABNORMAL HIGH (ref 70–99)
Glucose-Capillary: 109 mg/dL — ABNORMAL HIGH (ref 70–99)

## 2023-06-29 LAB — PHOSPHORUS: Phosphorus: 4.7 mg/dL — ABNORMAL HIGH (ref 2.5–4.6)

## 2023-06-29 LAB — BASIC METABOLIC PANEL WITH GFR
Anion gap: 10 (ref 5–15)
BUN: 33 mg/dL — ABNORMAL HIGH (ref 6–20)
CO2: 29 mmol/L (ref 22–32)
Calcium: 7.8 mg/dL — ABNORMAL LOW (ref 8.9–10.3)
Chloride: 99 mmol/L (ref 98–111)
Creatinine, Ser: 1.16 mg/dL (ref 0.61–1.24)
GFR, Estimated: >60 mL/min (ref >60)
Glucose, Bld: 123 mg/dL — ABNORMAL HIGH (ref 70–99)
Potassium: 3.9 mmol/L (ref 3.5–5.1)
Sodium: 138 mmol/L (ref 135–145)

## 2023-06-29 LAB — MAGNESIUM: Magnesium: 2 mg/dL (ref 1.7–2.4)

## 2023-06-29 MED ORDER — LIDOCAINE HCL (PF) 2% IJ FOR NEBU
2.5000 mL | RESPIRATORY_TRACT | Status: DC
  Administered 2023-06-29 – 2023-06-30 (×6): 2.5 mL via RESPIRATORY_TRACT
  Filled 2023-06-29 (×10): qty 5

## 2023-06-29 MED ORDER — ACETYLCYSTEINE 20 % IN SOLN
3.0000 mL | RESPIRATORY_TRACT | Status: DC
  Administered 2023-06-29 – 2023-06-30 (×7): 3 mL via RESPIRATORY_TRACT
  Filled 2023-06-29 (×7): qty 4

## 2023-06-29 MED ORDER — MIDAZOLAM HCL 2 MG/2ML IJ SOLN
1.0000 mg | INTRAMUSCULAR | Status: DC | PRN
  Administered 2023-06-29: 2 mg via INTRAVENOUS
  Filled 2023-06-29: qty 4

## 2023-06-29 NOTE — Plan of Care (Signed)
  Problem: Education: Goal: Knowledge of General Education information will improve Description: Including pain rating scale, medication(s)/side effects and non-pharmacologic comfort measures Outcome: Progressing   Problem: Clinical Measurements: Goal: Respiratory complications will improve Outcome: Not Progressing

## 2023-06-29 NOTE — Plan of Care (Signed)
  Problem: Education: Goal: Knowledge of General Education information will improve Description: Including pain rating scale, medication(s)/side effects and non-pharmacologic comfort measures Outcome: Progressing   Problem: Clinical Measurements: Goal: Ability to maintain clinical measurements within normal limits will improve Outcome: Progressing Goal: Cardiovascular complication will be avoided Outcome: Progressing   Problem: Nutrition: Goal: Adequate nutrition will be maintained Outcome: Progressing   Problem: Coping: Goal: Level of anxiety will decrease Outcome: Progressing   Problem: Elimination: Goal: Will not experience complications related to bowel motility Outcome: Progressing Goal: Will not experience complications related to urinary retention Outcome: Progressing   Problem: Clinical Measurements: Goal: Respiratory complications will improve Outcome: Not Progressing   Problem: Pain Managment: Goal: General experience of comfort will improve and/or be controlled Outcome: Not Progressing

## 2023-06-29 NOTE — Plan of Care (Signed)
 Discussed with patient and contacts plan of care for the evening, pain management and cold therapy with some teach back displayed by contacts.  Mom wanted update on her son's condition calling from [place].  Brendan Preston stated that another friend gave him water yesterday which may have contributed to his aspiration issues.  Will place sign on door and room no food or water ask nurse before giving any.  Problem: Education: Goal: Knowledge of General Education information will improve Description: Including pain rating scale, medication(s)/side effects and non-pharmacologic comfort measures Outcome: Progressing   Problem: Health Behavior/Discharge Planning: Goal: Ability to manage health-related needs will improve Outcome: Not Progressing   Problem: Pain Managment: Goal: General experience of comfort will improve and/or be controlled Outcome: Not Progressing

## 2023-06-29 NOTE — Progress Notes (Signed)
 RT advanced the ET Tube 4cm

## 2023-06-29 NOTE — Progress Notes (Signed)
 CPT on hold at this time. Patient remains to have moderate secretions. Patient is sleeping

## 2023-06-29 NOTE — Progress Notes (Signed)
 NAME:  Brendan Preston, MRN:  [id number], DOB:  05-Jan-1976, LOS: 16 ADMISSION DATE:  06/13/2023, CONSULTATION DATE:  06/13/23 REFERRING MD:  Dr. Doran Durand, CHIEF COMPLAINT:  AMS   History of Present Illness:  Pt encephalopathic, therefore HPI obtained from chart review.  See additional chart w/MRN [id number]  58 yoM with prior hx of known polysubstance (heroin/ fentanyl) abuse in which EMS was called out for AMS, last seen awake 3 days ago covered in urine and feces.   In ER, temp 104.3 rectal, tachycardic, normo to slightly hypertensive, sats 100% on NRB> but maintaining sats on RA with GCS ~10 but protecting airway.  Labs significant for lactic 8.4, WBC 15, plts 96, VBG 7.29/ 44,  CXR neg, RVP neg, EKG without ST changes, qtc borderline, CTH official read pending, trop hs 694, INR 1.3.  Foley placed- urine cloudy, glucose 150, Hgb 150 (-RBC), neg leuks/nitrates, WBC 6-10, UDS positive for THC and opiates.  CMET and other labs had to be redrawn and still awaiting due to hemolysis.  Cultures sent, empiric vanc/ cefepime given, 2L LR.  PCCM called for admit.  Pertinent  Medical History  Tobacco abuse, polysubstance abuse/ IVDA, ETOH unclear  Significant Hospital Events: Including procedures, antibiotic start and stop dates in addition to other pertinent events   3/7 admit, encephalopathic, altered, drug rash developed after cefepime BLOOD UCLURE MRSA 3/8 TTE negative for endocarditis 3/10 he had a TEE scheduled but sounds like he has declined this . Larey Seat out of bed late day shift and broke his nose. Bcx still pos. 3/11 Tubed in afternoon. CRP high.  3/12 changing his pain reg/ PAD 3/13 on vent 3/14 CT abdomen showed left pneumothorax, bilateral lower lobe consolidation, multiple cavitary lesions, suspicious for bilateral common iliac DVT, fluid collection along L4/L5 vertebral bodies 3/14 left pigtail chest tube for pneumothorax 3/15 started on IV heparin, venous duplex negative, MRI lumbar  spine suggests L5 osteo/no epidural abscess 3/17 changing IV heparin to LMWH as heparin levels not detectable. Still having marked trach secretions. Growing PA so added meropenem (given his rash w/ cefepime). Changed fent to dilaudid at 2mg /hr. CT clamped.  3/18 hgb down to 6.6 got 1 unit PRBC. No obvious bleeding source. Was >13 liters positive so also starting diuretics. Dec'd dilaudid dose to 1.5mg /hr. no PTX so CT removed.  3/19 tolerated 7 hours PSV 10/5 but had quite a lot of secretions requiring frequent suctioning CHES TTUBE REMOVED 3/21 - Temp 99.5. On 30% FiO2. Diuresed well overnight, 5L past 24 hours but still +8L net since admit. 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 On and off low grade fever 101F. On vent 30% fio2/ Per RN and patient - c/p severe cough an donly lidocaine neb helps. C/oc copious resp secretions. PER RN - due to copious secretions patient heading for trach  Interim History / Subjective:    3/23 -swinging fevers continue.  On ventilator FiO2 30%.  On Precedex infusion.  Now on Klonopin 1 mg 3 times daily scheduled.  Also on lidocaine nebulizer for severe cough.  Chest x-ray with ET tube 6 cm above the carina..  Positive balance 1.4 L since admission . LOT OF SPUTUM PROTDUCITON  Objective   Blood pressure (!) 179/94, pulse Marland Kitchen)  131, temperature 98 F (36.7 C), temperature source Axillary, resp. rate (!) 23, height 5\' 11"  (1.803 m), weight 63.4 kg, SpO2 93%.    Vent Mode: PRVC FiO2 (%):  [30 %] 30 % Set Rate:  [16 bmp] 16 bmp Vt Set:  [600 mL] 600 mL PEEP:  [5 cmH20] 5 cmH20 Pressure Support:  [5 cmH20-15 cmH20] 15 cmH20 Plateau Pressure:  [17 cmH20-22 cmH20] 20 cmH20   Intake/Output  Summary (Last 24 hours) at 06/29/2023 1253 Last data filed at 06/29/2023 1100 Gross per 24 hour  Intake 2605.58 ml  Output 3625 ml  Net -1019.42 ml   Filed Weights   06/27/23 0703 06/28/23 0500 06/29/23 0407  Weight: 67.3 kg 65.3 kg 63.4 kg    Examination:  General Appearance:  Looks criticall ill OBESE - No but cachecitc Head:  Normocephalic, without obvious abnormality, atraumatic Eyes:  PERRL - yes, conjunctiva/corneas - muddy     Ears:  Normal external ear canals, both ears Nose:  G tube - no Throat:  ETT TUBE - yes , OG tube - yes Neck:  Supple,  No enlargement/tenderness/nodules Lungs: Clear to auscultation bilaterally, Ventilator   Synchrony - yes but coughs a lot Heart:  S1 and S2 normal, no murmur, CVP - no.  Pressors - no Abdomen:  Soft, no masses, no organomegaly Genitalia / Rectal:  Not done Extremities:  Extremities- intact Skin:  ntact in exposed areas . Sacral area - not examined Neurologic:  Sedation - precredex gtt, scheduled klonopin etc -> RASS - +1 . Moves all 4s - yes. CAM-ICU - neg . Orientation - x3+     Resolved Hospital Problem list   Cefepime drug rash Encephalopathy  AKI  Shock  Pneumothorax. Resolved 3/18 chest tube removed  Assessment & Plan:   Sepsis with MRSA bacteremia, Cavitary pneumonia MRSA and then pseudomonas complicated by septic emboli and osteo to spine - TEE neg for endocarditis. Repeat blood cx from 3/9 no growth. Sputum cx 3/16 with few gpc and few gnr. Currently afebrile. WBC 13.1. ID following  3/23 - ongoing fevers due to lung absscess /necrotizing pneumonia.  Copious sputum production.  Plan ABx PER ID Recheck tracheal aspirate 06/29/2023.  Acute hypoxic respiratory failure due to ARDS with necrotizing MRSA PNA, septic emboli and pseudomonas PNA Left Pneumothorax- s/p chest tube placement 3/14 with subsequent removal 3/18 Severe ongoing cough  06/29/2023 - > does NOT meet criteria for SBT/Extubation in setting of Acute  Respiratory Failure due to deconditining and secretions.  Severe cough present.  Requiring lidocaine nebulizer.  ETT position 6 cm above the carina   Plan Advance ET tube PRVC VAP bundle Cough Rx  -Lidocaine nebulizer every 3 hours  -Scheduled Klonopin and opioids   -Mucomyst for 36 hours starting 06/29/2023 Needs tracheostomy    Acute HFrEF (45-50%) Possible PFO -on bubble study 06/19/2023; TEE w possible small PFO grade 1 L to R shunt    Plan Consider repeat TTE after acute illness Cont tele  Diuresis as above  Fluid and electrolyte imbalance:  Plan Follow BMP and replete as needed  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 - Neg LE duplex.  On Lovenox since 11/23/2023: Previously on IV heparin but levels could not be measured]  -Being treated with low molecular weight heparin Lovenox.  No active bleeding  Plan  Continue low molecular weight heparin   Anemia of critical illness -status post PRBC 06/24/2023 and 06/26/2023  -Hemoglobin 8.3 g% and continues downward drift but no active bleeding  Plkan  -  Monitor but currently no indication for CT abdomen rule out retroperitoneal bleed -> -Get CT abdomen pelvis rule out retroperitoneal bleed if there is clinical concern - - PRBC for hgb </= 6.9gm%    - exceptions are   -  if ACS susepcted/confirmed then transfuse for hgb </= 8.0gm%,  or    -  active bleeding with hemodynamic instability, then transfuse regardless of hemoglobin value   At at all times try to transfuse 1 unit prbc as possible with exception of active hemorrhage   Polysubstance abuse Opiate dependence Positive for opiates and THC on admission  Plan Continue scheduled oxycodone, Klonopin Continue Precedex, wean dose down Rass goal 0/-1 Delirium precautions   Fall, comminuted nasal bone fx  Plan ENT will see him as outpatient when stable  Protein calorie malnutrition, mild Plan Cont tube feeds     Best Practice (right click and "Reselect all SmartList  Selections" daily)   Diet/type: tubefeeds  DVT prophylaxis LMWH Pressure ulcer(s): N/A GI prophylaxis: PPI Lines: N/A Foley:  Yes, and it is still needed Code Status:  full code Last date of multidisciplinary goals of care discussion-parents updated at bedside   06/29/2023: Patient and friend Jillyn Hidden at the bedside updated   ATTESTATION & SIGNATURE   The patient Brendan Preston is critically ill with multiple organ systems failure and requires high complexity decision making for assessment and support, frequent evaluation and titration of therapies, application of advanced monitoring technologies and extensive interpretation of multiple databases and discussion with other appropriate health care personnel such as bedside nurses, social workers, case Production designer, theatre/television/film, consultants, respiratory therapists, nutritionists, secretaries etc.,  Critical care time includes but is not restricted to just documentation time. Documentation can happen in parallel or sequential to care time depending on case mix urgency and priorities for the shift. So, overall critical Care Time devoted to patient care services described in this note is  32  Minutes.   This time reflects time of care of this signee Dr Kalman Shan which includ does not reflect procedure time, or teaching time or supervisory time of PA/NP/Med student/Med Resident etc but could involve care discussion time     Dr. Kalman Shan, M.D., [facility].C.P Pulmonary and Critical Care Medicine Staff Physician, [facility] [place] [facility] Pager: [phone], If no answer or between  15:00h - 7:00h: call [phone]  06/29/2023 12:53 PM    LABS    PULMONARY No results for input(s): "PHART", "PCO2ART", "PO2ART", "HCO3", "TCO2", "O2SAT" in the last 168 hours.  Invalid input(s): "PCO2", "PO2"  CBC Recent Labs  Lab 06/27/23 0439 06/28/23 0648 06/29/23 0536  HGB 8.8* 8.7* 8.3*  HCT 27.6* 27.5* 26.8*  WBC 8.2 9.1  10.6*  PLT 337 370 391    COAGULATION No results for input(s): "INR" in the last 168 hours.  CARDIAC  No results for input(s): "TROPONINI" in the last 168 hours. No results for input(s): "PROBNP" in the last 168 hours.   CHEMISTRY Recent Labs  Lab 06/23/23 0314 06/24/23 0331 06/24/23 1224 06/24/23 1410 06/25/23 0615 06/26/23 0548 06/27/23 0058 06/28/23 0648 06/29/23 0536  NA 142   < >  --    < > 143 140 137 139 138  K 3.4*   < >  --    < > 3.4* 3.6 3.7 3.5 3.9  CL 112*   < >  --    < > 108 103 99 99 99  CO2 24   < >  --    < >  28 30 30 31 29   GLUCOSE 202*   < >  --    < > 136* 114* 163* 135* 123*  BUN 22*   < >  --    < > 19 21* 23* 26* 33*  CREATININE 1.03   < >  --    < > 1.00 0.88 1.07 1.07 1.16  CALCIUM 6.7*   < >  --    < > 7.4* 7.4* 7.3* 7.8* 7.8*  MG  --   --  1.7  --   --  1.6* 1.9 2.0 2.0  PHOS 3.3  --   --   --   --  3.7 4.5 5.1* 4.7*   < > = values in this interval not displayed.   Estimated Creatinine Clearance: 69.1 mL/min (by C-G formula based on SCr of 1.16 mg/dL).   LIVER Recent Labs  Lab 06/24/23 0331 06/25/23 0615  AST 25 26  ALT 28 27  ALKPHOS 57 63  BILITOT 0.6 0.5  PROT 5.4* 6.0*  ALBUMIN <1.5* <1.5*     INFECTIOUS No results for input(s): "LATICACIDVEN", "PROCALCITON" in the last 168 hours.   ENDOCRINE CBG (last 3)  Recent Labs    06/29/23 0335 06/29/23 0821 06/29/23 1141  GLUCAP 149* 139* 113*         IMAGING x48h  - image(s) personally visualized  -   highlighted in bold DG CHEST PORT 1 VIEW Result Date: 06/29/2023 CLINICAL DATA:  [id number].  Endotracheal tube present. EXAM: PORTABLE CHEST 1 VIEW COMPARISON:  Portable 06/25/2023. FINDINGS: 4:01 a.m. ETT tip is 6.6 cm from the carina. NGT enters the stomach with the intragastric course not evaluated. Right PICC terminates at the superior cavoatrial junction. Left-greater-than-right airspace disease and small underlying pleural effusions show no interval worsening or  improvement. A thin walled cystic cavitary focus in the left mid perihilar area is also unchanged. The the mediastinum is stable. There is aortic atherosclerosis. No new osseous abnormality. IMPRESSION: 1. Left greater than right airspace disease and small underlying pleural effusions show no interval worsening or improvement. 2. Support apparatus as above. Electronically Signed   By: Almira Bar M.D.   On: 06/29/2023 07:52

## 2023-06-30 ENCOUNTER — Inpatient Hospital Stay (HOSPITAL_COMMUNITY): Payer: MEDICAID

## 2023-06-30 DIAGNOSIS — J189 Pneumonia, unspecified organism: Secondary | ICD-10-CM

## 2023-06-30 DIAGNOSIS — R918 Other nonspecific abnormal finding of lung field: Secondary | ICD-10-CM

## 2023-06-30 DIAGNOSIS — M4626 Osteomyelitis of vertebra, lumbar region: Secondary | ICD-10-CM

## 2023-06-30 DIAGNOSIS — G061 Intraspinal abscess and granuloma: Secondary | ICD-10-CM

## 2023-06-30 LAB — BASIC METABOLIC PANEL WITH GFR
Anion gap: 9 (ref 5–15)
BUN: 45 mg/dL — ABNORMAL HIGH (ref 6–20)
CO2: 29 mmol/L (ref 22–32)
Calcium: 8 mg/dL — ABNORMAL LOW (ref 8.9–10.3)
Chloride: 101 mmol/L (ref 98–111)
Creatinine, Ser: 1.41 mg/dL — ABNORMAL HIGH (ref 0.61–1.24)
GFR, Estimated: >60 mL/min (ref >60)
Glucose, Bld: 166 mg/dL — ABNORMAL HIGH (ref 70–99)
Potassium: 3.6 mmol/L (ref 3.5–5.1)
Sodium: 139 mmol/L (ref 135–145)

## 2023-06-30 LAB — GLUCOSE, CAPILLARY
Glucose-Capillary: 115 mg/dL — ABNORMAL HIGH (ref 70–99)
Glucose-Capillary: 117 mg/dL — ABNORMAL HIGH (ref 70–99)
Glucose-Capillary: 122 mg/dL — ABNORMAL HIGH (ref 70–99)
Glucose-Capillary: 122 mg/dL — ABNORMAL HIGH (ref 70–99)
Glucose-Capillary: 125 mg/dL — ABNORMAL HIGH (ref 70–99)
Glucose-Capillary: 149 mg/dL — ABNORMAL HIGH (ref 70–99)
Glucose-Capillary: 163 mg/dL — ABNORMAL HIGH (ref 70–99)

## 2023-06-30 LAB — CBC
HCT: 25.8 % — ABNORMAL LOW (ref 39.0–52.0)
Hemoglobin: 7.9 g/dL — ABNORMAL LOW (ref 13.0–17.0)
MCH: 27.5 pg (ref 26.0–34.0)
MCHC: 30.6 g/dL (ref 30.0–36.0)
MCV: 89.9 fL (ref 80.0–100.0)
Platelets: 345 10*3/uL (ref 150–400)
RBC: 2.87 MIL/uL — ABNORMAL LOW (ref 4.22–5.81)
RDW: 17.2 % — ABNORMAL HIGH (ref 11.5–15.5)
WBC: 10.7 10*3/uL — ABNORMAL HIGH (ref 4.0–10.5)
nRBC: 0 % (ref 0.0–0.2)

## 2023-06-30 LAB — PHOSPHORUS: Phosphorus: 5.5 mg/dL — ABNORMAL HIGH (ref 2.5–4.6)

## 2023-06-30 LAB — VANCOMYCIN, TROUGH: Vancomycin Tr: 20 ug/mL (ref 15–20)

## 2023-06-30 LAB — MAGNESIUM: Magnesium: 2.1 mg/dL (ref 1.7–2.4)

## 2023-06-30 MED ORDER — HYDROCOD POLI-CHLORPHE POLI ER 10-8 MG/5ML PO SUER
5.0000 mL | Freq: Two times a day (BID) | ORAL | Status: DC
  Administered 2023-07-01 – 2023-07-14 (×27): 5 mL via ORAL
  Filled 2023-06-30 (×27): qty 5

## 2023-06-30 MED ORDER — OXYCODONE HCL 5 MG PO TABS
10.0000 mg | ORAL_TABLET | Freq: Four times a day (QID) | ORAL | Status: DC
  Administered 2023-06-30 – 2023-07-06 (×26): 10 mg via ORAL
  Filled 2023-06-30 (×26): qty 2

## 2023-06-30 MED ORDER — CLONAZEPAM 0.5 MG PO TBDP
1.0000 mg | ORAL_TABLET | Freq: Three times a day (TID) | ORAL | Status: DC
  Administered 2023-06-30 – 2023-07-01 (×2): 1 mg via ORAL
  Filled 2023-06-30 (×2): qty 2

## 2023-06-30 MED ORDER — ENOXAPARIN SODIUM 60 MG/0.6ML IJ SOSY
60.0000 mg | PREFILLED_SYRINGE | Freq: Two times a day (BID) | INTRAMUSCULAR | Status: DC
  Administered 2023-06-30 – 2023-07-09 (×18): 60 mg via SUBCUTANEOUS
  Filled 2023-06-30 (×18): qty 0.6

## 2023-06-30 MED ORDER — CIPROFLOXACIN IN D5W 400 MG/200ML IV SOLN
400.0000 mg | Freq: Three times a day (TID) | INTRAVENOUS | Status: DC
  Administered 2023-06-30 – 2023-07-01 (×3): 400 mg via INTRAVENOUS
  Filled 2023-06-30 (×3): qty 200

## 2023-06-30 MED ORDER — POLYETHYLENE GLYCOL 3350 17 G PO PACK
17.0000 g | PACK | Freq: Every day | ORAL | Status: DC
  Administered 2023-07-02 – 2023-07-12 (×2): 17 g via ORAL
  Filled 2023-06-30 (×9): qty 1

## 2023-06-30 MED ORDER — NOREPINEPHRINE 4 MG/250ML-% IV SOLN
0.0000 ug/min | INTRAVENOUS | Status: DC
  Administered 2023-06-30: 2 ug/min via INTRAVENOUS
  Filled 2023-06-30: qty 250

## 2023-06-30 MED ORDER — SENNOSIDES-DOCUSATE SODIUM 8.6-50 MG PO TABS
2.0000 | ORAL_TABLET | Freq: Two times a day (BID) | ORAL | Status: DC
  Administered 2023-07-02 – 2023-07-13 (×10): 2 via ORAL
  Filled 2023-06-30 (×21): qty 2

## 2023-06-30 MED ORDER — METHOCARBAMOL 500 MG PO TABS
500.0000 mg | ORAL_TABLET | Freq: Three times a day (TID) | ORAL | Status: DC | PRN
  Administered 2023-07-01 – 2023-07-11 (×4): 500 mg via ORAL
  Filled 2023-06-30 (×5): qty 1

## 2023-06-30 MED ORDER — GUAIFENESIN 100 MG/5ML PO LIQD
20.0000 mL | Freq: Two times a day (BID) | ORAL | Status: DC
  Administered 2023-07-01 – 2023-07-12 (×19): 20 mL via ORAL
  Filled 2023-06-30 (×25): qty 20

## 2023-06-30 MED ORDER — ACETAMINOPHEN 160 MG/5ML PO SOLN
650.0000 mg | Freq: Four times a day (QID) | ORAL | Status: DC | PRN
  Administered 2023-07-01 – 2023-07-07 (×2): 650 mg via ORAL
  Filled 2023-06-30 (×3): qty 20.3

## 2023-06-30 MED ORDER — IBUPROFEN 100 MG/5ML PO SUSP
800.0000 mg | Freq: Three times a day (TID) | ORAL | Status: DC | PRN
Start: 2023-06-30 — End: 2023-07-14
  Administered 2023-06-30: 800 mg via ORAL
  Filled 2023-06-30: qty 40

## 2023-06-30 MED ORDER — LIDOCAINE HCL (PF) 2% IJ FOR NEBU
2.5000 mL | RESPIRATORY_TRACT | Status: DC | PRN
Start: 2023-06-30 — End: 2023-07-14

## 2023-06-30 NOTE — Progress Notes (Signed)
 eLink Physician-Brief Progress Note Patient Name: Renso Swett DOB: Jan 03, 1976 MRN: [id number]   Date of Service  06/30/2023  HPI/Events of Note  57 yoM with prior hx of known polysubstance (heroin/ fentanyl) abuse in which EMS was called out for AMS, last seen awake 3 days ago covered in urine and feces.   BP 74/42 (52) Was just given lasix.   eICU Interventions  Initiated norepinephrine infusion     Intervention Category Intermediate Interventions: Hypotension - evaluation and management  Uriel Horkey 06/30/2023, 12:41 AM

## 2023-06-30 NOTE — Progress Notes (Signed)
   06/30/23 0859  Vent Select  Invasive or Noninvasive Invasive  Adult Vent Y  Airway 7.5 mm  Placement Date/Time: 06/17/23 1605   Placed By: ICU physician  Airway Device: Endotracheal Tube  Laryngoscope Blade: 4  ETT Types: Oral  Size (mm): 7.5 mm  Cuffed: Cuffed  Insertion attempts: 1  Airway Equipment: Stylet  Placement Confirmation: Direct...  Secured at (cm) (S)  28 cm (Changed ETT holder, 2 RTs present, ETT secure @ 28 cm.)  Measured From Lips  Secured Location Center  Secured By English as a second language teacher No  Tube Holder Repositioned Yes  Prone position No  Head position Right  Cuff Pressure (cm H2O) Green OR 18-26 CmH2O  Site Condition Drainage (Comment)  Adult Ventilator Settings  Vent Type Servo i  Humidity HME  Vent Mode (S)  CPAP;PSV  FiO2 (%) (S)  35 %  Pressure Support (S)  10 cmH20  PEEP (S)  5 cmH20  Adult Ventilator Measurements  Peak Airway Pressure 15 L/min  Mean Airway Pressure 10 cmH20  Resp Rate Spontaneous 15 br/min  Resp Rate Total 15 br/min  Spont TV 522 mL  Measured Ve 6.6 L  Total PEEP 5 cmH20  SpO2 96 %  Adult Ventilator Alarms  Alarms On Y  Ve High Alarm 20 L/min  Ve Low Alarm 3 L/min  Resp Rate High Alarm 38 br/min  Resp Rate Low Alarm 8  PEEP Low Alarm 3 cmH2O  Press High Alarm 50 cmH2O  T Apnea 20 sec(s)  VAP Prevention  HME changed Yes  HOB> 30 Degrees Y  Breath Sounds  Bilateral Breath Sounds Rhonchi  R Upper  Breath Sounds Rhonchi  L Upper Breath Sounds Rhonchi  R Lower Breath Sounds Rhonchi  L Lower Breath Sounds Rhonchi  Vent Respiratory Assessment  Level of Consciousness Alert  Respiratory Pattern Regular;Unlabored  Patient Tolerance Tolerated well  Suction Method  Respiratory Interventions Oral suction;Airway suction  Oral Suctioning/Secretions  Suction Type Oral  Suction Device Yankauer  Secretion Amount Moderate  Secretion Color Tan;Yellow  Secretion Consistency Thin  Suction Tolerance Tolerated well   Suctioning Adverse Effects None  Airway Suctioning/Secretions  Suction Type ETT  Suction Device  Catheter  Secretion Amount Copious  Secretion Color Yellow  Secretion Consistency Thick  Suction Tolerance Tolerated well  Suctioning Adverse Effects None

## 2023-06-30 NOTE — Plan of Care (Signed)
  Problem: Education: Goal: Knowledge of General Education information will improve Description: Including pain rating scale, medication(s)/side effects and non-pharmacologic comfort measures Outcome: Progressing   Problem: Coping: Goal: Level of anxiety will decrease Outcome: Progressing   Problem: Pain Managment: Goal: General experience of comfort will improve and/or be controlled Outcome: Progressing

## 2023-06-30 NOTE — Procedures (Signed)
 Extubation Procedure Note  Patient Details:   Name: Teion Ballin DOB: 1975/11/26 MRN: [id number]   Airway Documentation:    Vent end date: (S) 06/30/23 (Extubated to 2 L [place] per MD order.) Vent end time: 1221   Evaluation  O2 sats: stable throughout Complications: No apparent complications Patient did tolerate procedure well. Bilateral Breath Sounds: Rhonchi   Yes  Extubated pt to 2 L [place] per MD order. Wayne Both Shantoya Geurts 06/30/2023, 12:22 PM

## 2023-06-30 NOTE — Progress Notes (Addendum)
 [facility]  Date of Admission:  06/13/2023     Lines: 3/13-c Rue picc 3/07-c foley catheter  Abx: 3/19-c cipro 3/07-c vancomycin  3/17-19 meropenem 3/07-09 ceftriaxone   ASSESSMENT: 48 yo male polysubstance abuse (heroin/fentanyl), biba for ams found to have severe sepsis and mrsa bacteremia, complicated by respiratory failure  Other ID problems include: Bilateral pulm nodules presumed mrsa L5 vertebral om with epidural phlegmon Ongoing fever and HAP with pseudomonas aeruginosa Rash on cefepime  Other issues: Left pneumothorax s/p chest tube 3/14-3/19 Bilateral common iliac veins filling defects on abd pelv ct -- on tx dose enoxoparin Resp failure extubated 3/24    #mrsa bsi #bilateral pulm septic nodules #l4-5 vertebral om and epidural phlegmon 3/07 bcx mrsa 3/07 urine cx negative 3/09 bcx negative  3/08 Tte and 3/13 tee no sign of valve vegetation 3/14 abd pelv ct with contrast bilateral common iliac vein filling defect but no occult abscess 3/15 thoracic and lumbar spine L4-5 vertebral om and epidural phlegmon  -tolerating vancomycin; cr slight bump 3/24 -ongoing fever ?related to mrsa vs another nosocomial process (see below) -agree with prolong treatment at least 8 weeks abx given vertebral osteomyelitis    #hap/vap 3/16 sputum cx with PsA (S cefepime/ceftaz,cipro, imipenem); and mrsa 3/23 repeat sputum cx gpc and gnr    #ongoing fever Ddx drug fever, occult abscess from mrsa, nosocomial line infection such as yeast or other bacteria, acalculous cholecystitis, cdiff Diarrhea in setting tube feed with abx -- 3/24 denies abd pain n/v 3/24 resend blood cx No rash; Slight bump renal function today 3/24; checking lft tomorrow but no abd pain/tenderness 3/23 resp cx still with gpc and gnr   -w/u for new blood stream infection -extending treatment for pseudomonas hap/vap another 3 days from 3/24 -lft/cbc with diff  sent for the next 3 days -will consider repeat abd pelv ct with contrast in the next few days if ongoing fever   #ams resolved Initially at admission Head ct unremarkable 3/07 lp me panel negative; no cx; protein >600; glucose 87; wbc clotted   PLAN: Bcx repeat 3 days lft/cbc diff  Continue another 3 days pseudomonas hap/vap coverage until 3/27 Monitor for diarrhea/cdiff If ongoing fever for the next 3 days will repeat abd pelv ct with contrast Maintain contact infection prevention Supportive care otherwise per pulm/ccm Discussed with pulm/ccm dr Vassie Loll       Principal Problem:   MRSA bacteremia Active Problems:   Severe sepsis (HCC)   Septic embolism (HCC)   Malnutrition of moderate degree   Respiratory failure (HCC)   Diskitis   IVDU (intravenous drug user)   Allergies  Allergen Reactions   Penicillins Other (See Comments)    Noted on prior chart. Rash to cefepime 06/13/23, but tolerated ceftriaxone in 2020. Mother is unsure if he is truly allergic to penicillin. (2025)   Cefepime Rash    Scheduled Meds:  acetylcysteine  3 mL Nebulization Q4H   Chlorhexidine Gluconate Cloth  6 each Topical Daily   chlorpheniramine-HYDROcodone  5 mL Per Tube Q12H   clonazePAM  1 mg Per Tube Q6H   enoxaparin (LOVENOX) injection  60 mg Subcutaneous Q12H   guaiFENesin  20 mL Per Tube BID   insulin aspart  0-9 Units Subcutaneous Q4H   lidocaine  1 patch Transdermal Q24H   lidocaine  2.5 mL Nebulization Q3H   mouth rinse  15 mL Mouth Rinse Q2H   oxyCODONE  10  mg Per Tube Q4H   pantoprazole (PROTONIX) IV  40 mg Intravenous Q24H   polyethylene glycol  17 g Per Tube Daily   senna-docusate  2 tablet Per Tube BID   sodium chloride flush  10-40 mL Intracatheter Q12H   sodium chloride flush  3-10 mL Intravenous Q12H   thiamine (VITAMIN B1) injection  100 mg Intravenous Daily   Continuous Infusions:  ciprofloxacin     dexmedetomidine (PRECEDEX) IV infusion 0.6 mcg/kg/hr (06/30/23  1100)   vancomycin (VANCOCIN) 500 mg in sodium chloride 0.9 % 100 mL IVPB Stopped (06/29/23 2328)   PRN Meds:.acetaminophen (TYLENOL) oral liquid 160 mg/5 mL, acetaminophen, albuterol, bisacodyl, ibuprofen, methocarbamol, metoprolol tartrate, mouth rinse, mouth rinse, sodium chloride flush, sodium chloride flush   SUBJECTIVE: Patient extubated today Denies any focal pain No focal weakness/numbness No headache/eye pain No dyspnea/short of breath  Diarrhea still ongoing- rectal tube remains. Tubefeed off today No rash  Review of Systems: ROS All other ROS was negative, except mentioned above     OBJECTIVE: Vitals:   06/30/23 1149 06/30/23 1151 06/30/23 1200 06/30/23 1219  BP:   (!) 170/72   Pulse:   (!) 101 (!) 128  Resp:   (!) 27 (!) 22  Temp:   (!) 101.7 F (38.7 C) (!) 100.9 F (38.3 C)  TempSrc:      SpO2: 98% 98% 99% 97%  Weight:      Height:       Body mass index is 19.4 kg/m.  Physical Exam General/constitutional: no distress, pleasant; writing questions down -- just extubated today so nonverbal (complains of some sorethroat) HEENT: Normocephalic, PER, Conj Clear, EOMI, Oropharynx clear Neck supple CV: rrr no mrg Lungs: clear to auscultation, normal respiratory effort Abd: Soft, Nontender Ext: no edema Skin: No Rash Neuro: nonfocal MSK: no peripheral joint swelling/tenderness/warmth; back spines nontender   Central line presence: right upper ext picc site no erythema/purulence/tenderness   Lab Results Lab Results  Component Value Date   WBC 10.7 (H) 06/30/2023   HGB 7.9 (L) 06/30/2023   HCT 25.8 (L) 06/30/2023   MCV 89.9 06/30/2023   PLT 345 06/30/2023    Lab Results  Component Value Date   CREATININE 1.41 (H) 06/30/2023   BUN 45 (H) 06/30/2023   NA 139 06/30/2023   K 3.6 06/30/2023   CL 101 06/30/2023   CO2 29 06/30/2023    Lab Results  Component Value Date   ALT 27 06/25/2023   AST 26 06/25/2023   ALKPHOS 63 06/25/2023   BILITOT  0.5 06/25/2023      Microbiology: Recent Results (from the past 240 hours)  MIC (1 Drug)-blood culture; 06/13/2023; BLOOD; MRSA; Daptomycin; Patient immune status: Normal     Status: Abnormal   Collection Time: 06/20/23 12:56 PM   Specimen: BLOOD  Result Value Ref Range Status   Min Inhibitory Conc (1 Drug) Final report (A)  Corrected    Comment: (NOTE) Performed At: [facility] [address] [place], [place] [id number] Jolene Schimke MD WG:[id number] CORRECTED ON 03/19 AT 1235: PREVIOUSLY REPORTED AS Preliminary report    Source BLOOD  Final    Comment: Performed at [facility], [address] [place], [place] [address]  MIC Result     Status: Abnormal   Collection Time: 06/20/23 12:56 PM  Result Value Ref Range Status   Result 1 (MIC) Comment (A)  Final    Comment: (NOTE) Methicillin - resistant Staphylococcus aureus Identification performed by account, not confirmed  by this laboratory. Testing performed by broth microdilution. DAPTOMYCIN  <=0.25  SUSCEPTIBLE Performed At: [facility] [address] [place], [place] [id number] Jolene Schimke MD WU:[id number]   Culture, Respiratory w Gram Stain     Status: None   Collection Time: 06/22/23  8:31 AM   Specimen: Tracheal Aspirate; Respiratory  Result Value Ref Range Status   Specimen Description   Final    TRACHEAL ASPIRATE Performed at [facility], [address], [place], [place] [address]    Special Requests   Final    NONE Performed at [facility], [address] [place], [place] [address]    Gram Stain   Final    RARE WBC PRESENT, PREDOMINANTLY PMN FEW GRAM POSITIVE COCCI FEW GRAM NEGATIVE RODS Performed at [facility], [address], [place], [place] [address]    Culture   Final    ABUNDANT PSEUDOMONAS AERUGINOSA ABUNDANT METHICILLIN RESISTANT STAPHYLOCOCCUS AUREUS    Report Status 06/24/2023 FINAL  Final   Organism ID, Bacteria  PSEUDOMONAS AERUGINOSA  Final   Organism ID, Bacteria METHICILLIN RESISTANT STAPHYLOCOCCUS AUREUS  Final      Susceptibility   Methicillin resistant staphylococcus aureus - MIC*    CIPROFLOXACIN >=8 RESISTANT Resistant     ERYTHROMYCIN >=8 RESISTANT Resistant     GENTAMICIN <=0.5 SENSITIVE Sensitive     OXACILLIN >=4 RESISTANT Resistant     TETRACYCLINE <=1 SENSITIVE Sensitive     VANCOMYCIN 1 SENSITIVE Sensitive     TRIMETH/SULFA 160 RESISTANT Resistant     CLINDAMYCIN <=0.25 SENSITIVE Sensitive     RIFAMPIN <=0.5 SENSITIVE Sensitive     Inducible Clindamycin NEGATIVE Sensitive     LINEZOLID 2 SENSITIVE Sensitive     * ABUNDANT METHICILLIN RESISTANT STAPHYLOCOCCUS AUREUS   Pseudomonas aeruginosa - MIC*    CEFTAZIDIME 4 SENSITIVE Sensitive     CIPROFLOXACIN <=0.25 SENSITIVE Sensitive     GENTAMICIN 4 SENSITIVE Sensitive     IMIPENEM 2 SENSITIVE Sensitive     PIP/TAZO 16 SENSITIVE Sensitive ug/mL    CEFEPIME 2 SENSITIVE Sensitive     * ABUNDANT PSEUDOMONAS AERUGINOSA  Culture, Respiratory w Gram Stain     Status: None (Preliminary result)   Collection Time: 06/29/23  1:25 PM   Specimen: Tracheal Aspirate; Respiratory  Result Value Ref Range Status   Specimen Description   Final    TRACHEAL ASPIRATE Performed at [facility], [address], [place], [place] [address]    Special Requests   Final    NONE Performed at [facility], [address] [place], [place] [address]    Gram Stain   Final    NO WBC SEEN RARE GRAM POSITIVE COCCI RARE GRAM NEGATIVE RODS    Culture   Final    ABUNDANT STAPHYLOCOCCUS AUREUS FEW PSEUDOMONAS AERUGINOSA SUSCEPTIBILITIES TO FOLLOW Performed at [facility], [address], [place], [place] [address]    Report Status PENDING  Incomplete     Serology:   Imaging: If present, new imagings (plain films, ct scans, and mri) have been personally visualized and interpreted; radiology reports have  been reviewed. Decision making incorporated into the Impression / Recommendations.  06/21/23 mri lumbar spine wwo contrast 1. Abnormal edema and enhancement affecting the L5 vertebral body which could go along with osteomyelitis. The L4-5 and L5-S1 disc spaces are normal. 2. Abnormal appearance of the paravertebral soft tissues surrounding L5 including the epidural space at L5-S1 with  edema and enhancement consistent with inflammatory change. The epidural changes are phlegmonous at this point in time without what I would characterize as a frank epidural abscess.   3/15 mri thoracic spine wwo contrast 1. No evidence of discitis, osteomyelitis or septic facet arthritis. 2. Extensive patchy bilateral pulmonary pathology, not well evaluated using this technique.   3/14 ct abd pelv with contrast 1. Incompletely visualized small to moderate left pneumothorax at the left lung base. 2. Negative for rim enhancing fluid collection within the abdomen or pelvis to suggest drainable abscess. 3. Contracted gallbladder with possible wall thickening but no calcified stones. 4. Persistent bilateral lower lobe consolidations with areas of heterogeneous low density suggestive of necrotic infection. Multiple cavitary lung lesions corresponding to history of septic emboli. Small bilateral pleural effusions. 5. Suspicion of hypodense filling defect within the bilateral common iliac veins right greater than left, suspicious for DVT. 6. Question small volume low-density fluid along the right anterior margins of the L4 and L5 vertebral bodies. No overt osseous destructive change or abnormal appearance of the disc spaces but presence of spine infection cannot be excluded and correlation with MRI should be considered. 7. Small free fluid within the abdomen and pelvis. Presacral soft tissue stranding and small volume fluid. Mild generalized subcutaneous edema. 8. Slightly thick-walled urinary bladder,  decompressed by Foley catheter.    3/13 tee  1. Left ventricular ejection fraction, by estimation, is 45 to 50%. The  left ventricle has mildly decreased function.   2. Right ventricular systolic function is normal. The right ventricular  size is normal.   3. No left atrial/left atrial appendage thrombus was detected.   4. The mitral valve is grossly normal. Trivial mitral valve  regurgitation.   5. The aortic valve is tricuspid. Aortic valve regurgitation is not  visualized.   6. Agitated saline contrast bubble study was positive with shunting  observed within 3-6 cardiac cycles suggestive of interatrial shunt, Grade  1.   Conclusion(s)/Recommendation(s): No evidence of vegetation/infective  endocarditis on this transesophageael echocardiogram.      3/9 ct chest without contrast 1. Very extensive heterogeneous and consolidative airspace disease throughout the lungs, much of this nodular in appearance, with several cavitary nodules present. Findings are highly suspicious for septic emboli given stated clinical concern. 2. Small bilateral pleural effusions. 3. Cardiomegaly.   3/08 tte  1. Left ventricular ejection fraction, by estimation, is 40 to 45%. The  left ventricle has mildly decreased function. The left ventricle  demonstrates global hypokinesis. The left ventricular internal cavity size  was mildly dilated. There is mild left ventricular hypertrophy. Left ventricular diastolic parameters were normal.   2. Right ventricular systolic function is moderately reduced. The right  ventricular size is moderately enlarged. Tricuspid regurgitation signal is  inadequate for assessing PA pressure.   3. The mitral valve is abnormal. Trivial mitral valve regurgitation. No  evidence of mitral stenosis.   4. The aortic valve is tricuspid. Aortic valve regurgitation is not  visualized. No aortic stenosis is present.   5. The inferior vena cava is normal in size with greater than  50%  respiratory variability, suggesting right atrial pressure of 3 mmHg.   6. Agitated saline contrast bubble study was negative, with no evidence  of any interatrial shunt.      Raymondo Band, MD [facility] [phone] pager    06/30/2023, 12:36 PM

## 2023-06-30 NOTE — Progress Notes (Signed)
 Pharmacy Antibiotic Note  Brendan Preston is a 48 y.o. male admitted on 06/13/2023 with bacteremia.  Pharmacy has been consulted for Vancomycin + Ciprofloxacin dosing.  AKI noted this AM with SCr up to 1.41 << 1.16, a trough prior to his AM dose resulted as 20 mcg/ml - which is in an appropriate range to re-dose however dose show accumulation since the anticipated VT on his new dose from 3/21 adjustments was 11 mcg/ml.   VT of 20 mcg/ml shows some accumulation - will delay his dose today ~8 hours and tentatively dose to resume q12h dosing tomorrow after review of AM BMET for SCr trends (noted fluctuations between 0.8-1.1 this admission)   Plan: - Delay Vancomycin 500 mg dose to 1800 today then tentatively resume Vancomycin to 500 mg IV every 12 hours on 3/25 AM - BMET ordered for 3/25 AM - will review and make dosing adjustments if needed - Resume Ciprofloxacin 400 mg IV every 8 hours - will extend duration for 3 more days for a total LOT of 10d given PsA in 3/23 RCx - Will continue to follow renal function, culture results, LOT, and antibiotic de-escalation plans   Height: 5\' 11"  (180.3 cm) Weight: 63.1 kg (139 lb 1.8 oz) IBW/kg (Calculated) : 75.3  Temp (24hrs), Avg:99 F (37.2 C), Min:97.2 F (36.2 C), Max:103.8 F (39.9 C)  Recent Labs  Lab 06/23/23 1248 06/24/23 0331 06/26/23 0548 06/27/23 0030 06/27/23 0058 06/27/23 0439 06/27/23 1010 06/28/23 0648 06/29/23 0536 06/30/23 0354 06/30/23 0900  WBC  --    < > 7.0  --   --  8.2  --  9.1 10.6* 10.7*  --   CREATININE  --    < > 0.88  --  1.07  --   --  1.07 1.16 1.41*  --   VANCOTROUGH  --   --   --   --   --   --  17  --   --   --  20  VANCOPEAK 31  --   --  33  --   --   --   --   --   --   --    < > = values in this interval not displayed.    Estimated Creatinine Clearance: 56.6 mL/min (A) (by C-G formula based on SCr of 1.41 mg/dL (H)).    Allergies  Allergen Reactions   Penicillins Other (See Comments)    Noted on  prior chart. Rash to cefepime 06/13/23, but tolerated ceftriaxone in 2020. Mother is unsure if he is truly allergic to penicillin. (2025)   Cefepime Rash    Antimicrobials this admission: Vancomycin 3/7 >> 3/17 Cefepime 3/7 x 1 Ceftriaxone 3/8 >> 3/9 Meropenem 3/17 >> Cipro 3/19 >>  Dose adjustments this admission: 3/8 VR 17 mcg/ml 3/13 VP/VT 26/18 mcg/ml - AUC 543 3/17 VP/VT 31/18 mcg/ml - AUC 600 3/21 VP/VT 33/17 mcg/ml - AUC 618 - reduce to 500 mg/12h (eAUC 412, VT 11) 3/24 VT 20 mcg/ml (anticipated 11 mcg/ml), SCr rising  Microbiology results: 3/7 BCx 4/4 MRSA 3/9 BCx >> NGf 3/12 RCx (TA) >> MRSA 3/16 RCx (TA) >> staph aureus + PsA  Thank you for allowing pharmacy to be a part of this patient's care.  Georgina Pillion, PharmD, BCPS, BCIDP Infectious Diseases Clinical Pharmacist 06/30/2023 12:34 PM   **Pharmacist phone directory can now be found on [email or web address] ([email or web address]).  Listed under [facility].

## 2023-06-30 NOTE — Progress Notes (Signed)
 NAME:  Brendan Preston, MRN:  [id number], DOB:  Nov 25, 1975, LOS: 17 ADMISSION DATE:  06/13/2023, CONSULTATION DATE:  06/13/23 REFERRING MD:  Dr. Doran Durand, CHIEF COMPLAINT:  AMS   History of Present Illness:  Pt encephalopathic, therefore HPI obtained from chart review.  See additional chart w/MRN [id number]  45 yoM with prior hx of known polysubstance (heroin/ fentanyl) abuse in which EMS was called out for AMS, last seen awake 3 days ago covered in urine and feces.   In ER, temp 104.3 rectal, tachycardic, normo to slightly hypertensive, sats 100% on NRB> but maintaining sats on RA with GCS ~10 but protecting airway.  Labs significant for lactic 8.4, WBC 15, plts 96, VBG 7.29/ 44,  CXR neg, RVP neg, EKG without ST changes, qtc borderline, CTH official read pending, trop hs 694, INR 1.3.  Foley placed- urine cloudy, glucose 150, Hgb 150 (-RBC), neg leuks/nitrates, WBC 6-10, UDS positive for THC and opiates.  CMET and other labs had to be redrawn and still awaiting due to hemolysis.  Cultures sent, empiric vanc/ cefepime given, 2L LR.  PCCM called for admit.  Pertinent  Medical History  Tobacco abuse, polysubstance abuse/ IVDA, ETOH unclear  Significant Hospital Events: Including procedures, antibiotic start and stop dates in addition to other pertinent events   3/7 admit, encephalopathic, altered, drug rash developed after cefepime BLOOD UCLURE MRSA 3/8 TTE negative for endocarditis 3/10 he had a TEE scheduled but sounds like he has declined this . Larey Seat out of bed late day shift and broke his nose. Bcx still pos. 3/11 Tubed in afternoon. CRP high.  3/12 changing his pain reg/ PAD 3/13 on vent 3/14 CT abdomen showed left pneumothorax, bilateral lower lobe consolidation, multiple cavitary lesions, suspicious for bilateral common iliac DVT, fluid collection along L4/L5 vertebral bodies 3/14 left pigtail chest tube for pneumothorax 3/15 started on IV heparin, venous duplex negative, MRI lumbar  spine suggests L5 osteo/no epidural abscess 3/17 changing IV heparin to LMWH as heparin levels not detectable. Still having marked trach secretions. Growing PA so added meropenem (given his rash w/ cefepime). Changed fent to dilaudid at 2mg /hr. CT clamped.  3/18 hgb down to 6.6 got 1 unit PRBC. No obvious bleeding source. Was >13 liters positive so also starting diuretics. Dec'd dilaudid dose to 1.5mg /hr. no PTX so CT removed.  3/19 tolerated 7 hours PSV 10/5 but had quite a lot of secretions requiring frequent suctioning CHES TTUBE REMOVED 3/21 - Temp 99.5. On 30% FiO2. Diuresed well overnight, 5L past 24 hours but still +8L net since admit. 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 On and off low grade fever 101F. On vent 30% fio2/ Per RN and patient - c/p severe cough an donly lidocaine neb helps. C/oc copious resp secretions. PER RN - due to copious secretions patient heading for trach  Interim History / Subjective:  Remains critically ill, intubated Remains febrile , 102 max Copious secretions persist Levophed restarted overnight on low-dose at 3 mics 3.1 L urine output  Objective   Blood pressure 118/66, pulse 66, temperature 98.6 F (37 C), temperature source Oral, resp. rate 16, height 5\' 11"  (1.803 m), weight 63.1 kg, SpO2 96%.    Vent Mode: PRVC FiO2 (%):  [30 %-35 %]  35 % Set Rate:  [16 bmp] 16 bmp Vt Set:  [600 mL] 600 mL PEEP:  [5 cmH20-8 cmH20] 8 cmH20 Pressure Support:  [15 cmH20] 15 cmH20 Plateau Pressure:  [18 cmH20] 18 cmH20   Intake/Output Summary (Last 24 hours) at 06/30/2023 0915 Last data filed at 06/30/2023 0615 Gross per 24 hour  Intake 2622.26 ml  Output 3525 ml  Net -902.74 ml   Filed  Weights   06/28/23 0500 06/29/23 0407 06/30/23 0457  Weight: 65.3 kg 63.4 kg 63.1 kg    Examination:  General Appearance:  Looks criticall ill ,cachecitc Head:  Normocephalic, without obvious abnormality, atraumatic Eyes:  PERRL - yes, conjunctiva/corneas - muddy     Throat:  ETT TUBE - yes , OG tube - yes Neck:  Supple,  No enlargement/tenderness/nodules Lungs: Frequent coughing, scattered crackles, no accessory muscle use Heart:  S1 and S2 normal, no murmur, CVP - no.  Pressors - no Abdomen:  Soft, no masses, no organomegaly Extremities: No deformity, no edema Skin:  ntact in exposed areas . Sacral area - not examined Neurologic: Awake, calm on Precedex, RASS 0 to +1, able to write and communicate   Chest CT shows ET tube in position, bilateral airspace disease as prior. Labs show normal electrolytes, BUN/creatinine increased to 45/1.4, no leukocytosis, stable anemia  Resolved Hospital Problem list   Cefepime drug rash Encephalopathy  AKI  Shock  Pneumothorax. Resolved 3/18 chest tube removed  Assessment & Plan:   Sepsis with MRSA bacteremia, Cavitary pneumonia MRSA and then pseudomonas complicated by septic emboli and osteo to spine - TEE neg for endocarditis.   - ongoing fevers due to lung absscess /necrotizing pneumonia.  Copious sputum production.  Plan 7 days of Cipro plan, continue vancomycin Await recheck tracheal aspirate 06/29/2023.  Acute hypoxic respiratory failure due to ARDS with necrotizing MRSA PNA, septic emboli and pseudomonas PNA Left Pneumothorax- s/p chest tube placement 3/14 with subsequent removal 3/18 Severe ongoing cough   Plan Spontaneous breathing trials, may plan for trial of extubation rather than tracheostomy VAP bundle Cough Rx  -Lidocaine nebulizer every 3 hours  -Scheduled Klonopin and opioids   -Mucomyst for 36 hours starting 06/29/2023     Acute HFrEF (45-50%) Possible PFO -on bubble study 06/19/2023; TEE w possible small PFO  grade 1 L to R shunt    Plan Consider repeat TTE after acute illness Cont tele  Hold diuresis, now on low-dose Levophed    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 - Neg LE duplex.  On Lovenox since 11/23/2023: Previously on IV heparin but levels could not be measured]  -Being treated with low molecular weight heparin Lovenox.  No active bleeding  Plan  Continue low molecular weight heparin   Anemia of critical illness -status post PRBC 06/24/2023 and 06/26/2023  -Hemoglobin 8.3 g% and continues downward drift but no active bleeding  Plkan  - Monitor but currently no indication for CT abdomen rule out retroperitoneal bleed -> -Get CT abdomen pelvis rule out retroperitoneal bleed if there is clinical concern - - PRBC for hgb </= 6.9gm%      Polysubstance abuse Opiate dependence Positive for opiates and THC on admission  Plan Continue scheduled oxycodone, Klonopin Continue Precedex, wean dose down Rass goal 0/-1 Delirium precautions   Fall, comminuted nasal bone fx  Plan ENT will see him as outpatient when stable  Protein calorie malnutrition, mild Plan Cont tube feeds     Best Practice (right click and "Reselect all SmartList Selections" daily)  Diet/type: tubefeeds  DVT prophylaxis LMWH Pressure ulcer(s): N/A GI prophylaxis: PPI Lines: N/A Foley:  Yes, and it is still needed Code Status:  full code Last date of multidisciplinary goals of care discussion-parents updated periodically      ATTESTATION & SIGNATURE   The patient Gaetano Romberger is critically ill with multiple organ systems failure and requires high complexity decision making for assessment and support, frequent evaluation and titration of therapies, application of advanced monitoring technologies and extensive interpretation of multiple databases and discussion with other appropriate health care personnel such as bedside nurses, social workers, case Production designer, theatre/television/film, consultants, respiratory therapists,  nutritionists, secretaries etc.,  Critical care time includes but is not restricted to just documentation time. Documentation can happen in parallel or sequential to care time depending on case mix urgency and priorities for the shift. So, overall critical Care Time devoted to patient care services described in this note is  33  Minutes.   This time reflects time of care of this signee Dr Kalman Shan which includ does not reflect procedure time, or teaching time or supervisory time of PA/NP/Med student/Med Resident etc but could involve care discussion time     Cyril Mourning MD. [facility]. [place] [facility] Pager : [phone]  If no response to pager , please call [phone] until 7 pm After 7:00 pm call Elink  [phone]   06/30/2023   06/30/2023 9:15 AM    LABS    PULMONARY No results for input(s): "PHART", "PCO2ART", "PO2ART", "HCO3", "TCO2", "O2SAT" in the last 168 hours.  Invalid input(s): "PCO2", "PO2"  CBC Recent Labs  Lab 06/28/23 0648 06/29/23 0536 06/30/23 0354  HGB 8.7* 8.3* 7.9*  HCT 27.5* 26.8* 25.8*  WBC 9.1 10.6* 10.7*  PLT 370 391 345    COAGULATION No results for input(s): "INR" in the last 168 hours.  CARDIAC  No results for input(s): "TROPONINI" in the last 168 hours. No results for input(s): "PROBNP" in the last 168 hours.   CHEMISTRY Recent Labs  Lab 06/26/23 0548 06/27/23 0058 06/28/23 0648 06/29/23 0536 06/30/23 0354  NA 140 137 139 138 139  K 3.6 3.7 3.5 3.9 3.6  CL 103 99 99 99 101  CO2 30 30 31 29 29   GLUCOSE 114* 163* 135* 123* 166*  BUN 21* 23* 26* 33* 45*  CREATININE 0.88 1.07 1.07 1.16 1.41*  CALCIUM 7.4* 7.3* 7.8* 7.8* 8.0*  MG 1.6* 1.9 2.0 2.0 2.1  PHOS 3.7 4.5 5.1* 4.7* 5.5*   Estimated Creatinine Clearance: 56.6 mL/min (A) (by C-G formula based on SCr of 1.41 mg/dL (H)).   LIVER Recent Labs  Lab 06/24/23 0331 06/25/23 0615  AST 25 26  ALT 28 27  ALKPHOS 57 63  BILITOT 0.6 0.5  PROT 5.4* 6.0*   ALBUMIN <1.5* <1.5*     INFECTIOUS No results for input(s): "LATICACIDVEN", "PROCALCITON" in the last 168 hours.   ENDOCRINE CBG (last 3)  Recent Labs    06/30/23 0002 06/30/23 0427 06/30/23 0748  GLUCAP 163* 149* 122*

## 2023-07-01 LAB — CBC WITH DIFFERENTIAL/PLATELET
Abs Immature Granulocytes: 0.15 10*3/uL — ABNORMAL HIGH (ref 0.00–0.07)
Basophils Absolute: 0.1 10*3/uL (ref 0.0–0.1)
Basophils Relative: 1 %
Eosinophils Absolute: 0 10*3/uL (ref 0.0–0.5)
Eosinophils Relative: 0 %
HCT: 26.4 % — ABNORMAL LOW (ref 39.0–52.0)
Hemoglobin: 8.3 g/dL — ABNORMAL LOW (ref 13.0–17.0)
Immature Granulocytes: 1 %
Lymphocytes Relative: 20 %
Lymphs Abs: 2.8 10*3/uL (ref 0.7–4.0)
MCH: 27.7 pg (ref 26.0–34.0)
MCHC: 31.4 g/dL (ref 30.0–36.0)
MCV: 88 fL (ref 80.0–100.0)
Monocytes Absolute: 0.9 10*3/uL (ref 0.1–1.0)
Monocytes Relative: 7 %
Neutro Abs: 10 10*3/uL — ABNORMAL HIGH (ref 1.7–7.7)
Neutrophils Relative %: 71 %
Platelets: 388 10*3/uL (ref 150–400)
RBC: 3 MIL/uL — ABNORMAL LOW (ref 4.22–5.81)
RDW: 17.2 % — ABNORMAL HIGH (ref 11.5–15.5)
WBC: 13.9 10*3/uL — ABNORMAL HIGH (ref 4.0–10.5)
nRBC: 0 % (ref 0.0–0.2)

## 2023-07-01 LAB — HEPATIC FUNCTION PANEL
ALT: 48 U/L — ABNORMAL HIGH (ref 0–44)
AST: 51 U/L — ABNORMAL HIGH (ref 15–41)
Albumin: 1.9 g/dL — ABNORMAL LOW (ref 3.5–5.0)
Alkaline Phosphatase: 108 U/L (ref 38–126)
Bilirubin, Direct: <0.1 mg/dL (ref 0.0–0.2)
Total Bilirubin: 0.6 mg/dL (ref 0.0–1.2)
Total Protein: 8.3 g/dL — ABNORMAL HIGH (ref 6.5–8.1)

## 2023-07-01 LAB — GLUCOSE, CAPILLARY
Glucose-Capillary: 130 mg/dL — ABNORMAL HIGH (ref 70–99)
Glucose-Capillary: 117 mg/dL — ABNORMAL HIGH (ref 70–99)
Glucose-Capillary: 131 mg/dL — ABNORMAL HIGH (ref 70–99)
Glucose-Capillary: 103 mg/dL — ABNORMAL HIGH (ref 70–99)
Glucose-Capillary: 146 mg/dL — ABNORMAL HIGH (ref 70–99)

## 2023-07-01 LAB — BASIC METABOLIC PANEL WITH GFR
Anion gap: 9 (ref 5–15)
BUN: 35 mg/dL — ABNORMAL HIGH (ref 6–20)
CO2: 29 mmol/L (ref 22–32)
Calcium: 7.9 mg/dL — ABNORMAL LOW (ref 8.9–10.3)
Chloride: 99 mmol/L (ref 98–111)
Creatinine, Ser: 1.19 mg/dL (ref 0.61–1.24)
GFR, Estimated: >60 mL/min (ref >60)
Glucose, Bld: 114 mg/dL — ABNORMAL HIGH (ref 70–99)
Potassium: 3.2 mmol/L — ABNORMAL LOW (ref 3.5–5.1)
Sodium: 137 mmol/L (ref 135–145)

## 2023-07-01 LAB — MAGNESIUM: Magnesium: 2.1 mg/dL (ref 1.7–2.4)

## 2023-07-01 LAB — PHOSPHORUS: Phosphorus: 4.6 mg/dL (ref 2.5–4.6)

## 2023-07-01 MED ORDER — INSULIN ASPART 100 UNIT/ML IJ SOLN
0.0000 [IU] | Freq: Three times a day (TID) | INTRAMUSCULAR | Status: DC
  Administered 2023-07-02 – 2023-07-04 (×5): 1 [IU] via SUBCUTANEOUS

## 2023-07-01 MED ORDER — PIPERACILLIN-TAZOBACTAM 3.375 G IVPB
3.3750 g | Freq: Three times a day (TID) | INTRAVENOUS | Status: AC
  Administered 2023-07-01 – 2023-07-08 (×21): 3.375 g via INTRAVENOUS
  Filled 2023-07-01 (×21): qty 50

## 2023-07-01 MED ORDER — ENSURE ENLIVE PO LIQD
237.0000 mL | Freq: Three times a day (TID) | ORAL | Status: DC
  Administered 2023-07-01 – 2023-07-14 (×34): 237 mL via ORAL

## 2023-07-01 MED ORDER — CLONAZEPAM 0.5 MG PO TBDP
1.0000 mg | ORAL_TABLET | Freq: Two times a day (BID) | ORAL | Status: DC
  Administered 2023-07-01 – 2023-07-14 (×26): 1 mg via ORAL
  Filled 2023-07-01 (×10): qty 2
  Filled 2023-07-01: qty 8
  Filled 2023-07-01 (×10): qty 2
  Filled 2023-07-01: qty 8
  Filled 2023-07-01: qty 2
  Filled 2023-07-01: qty 8
  Filled 2023-07-01: qty 2
  Filled 2023-07-01: qty 8
  Filled 2023-07-01: qty 2

## 2023-07-01 MED ORDER — PANTOPRAZOLE SODIUM 40 MG PO TBEC
40.0000 mg | DELAYED_RELEASE_TABLET | Freq: Every day | ORAL | Status: DC
  Administered 2023-07-01 – 2023-07-14 (×14): 40 mg via ORAL
  Filled 2023-07-01 (×14): qty 1

## 2023-07-01 MED ORDER — ADULT MULTIVITAMIN W/MINERALS CH
1.0000 | ORAL_TABLET | Freq: Every day | ORAL | Status: DC
  Administered 2023-07-01 – 2023-07-14 (×14): 1 via ORAL
  Filled 2023-07-01 (×15): qty 1

## 2023-07-01 MED ORDER — OXYCODONE HCL 5 MG PO TABS
10.0000 mg | ORAL_TABLET | Freq: Once | ORAL | Status: AC
  Administered 2023-07-01: 10 mg via ORAL
  Filled 2023-07-01: qty 2

## 2023-07-01 MED ORDER — POTASSIUM CHLORIDE CRYS ER 20 MEQ PO TBCR
40.0000 meq | EXTENDED_RELEASE_TABLET | ORAL | Status: AC
  Administered 2023-07-01 (×2): 40 meq via ORAL
  Filled 2023-07-01 (×2): qty 2

## 2023-07-01 MED ORDER — PHENOL 1.4 % MT LIQD
1.0000 | OROMUCOSAL | Status: DC | PRN
  Administered 2023-07-01: 1 via OROMUCOSAL
  Filled 2023-07-01 (×2): qty 177

## 2023-07-01 MED ORDER — ORAL CARE MOUTH RINSE: 15.0000 mL | OROMUCOSAL | Status: DC | PRN

## 2023-07-01 MED ORDER — THIAMINE MONONITRATE 100 MG PO TABS
100.0000 mg | ORAL_TABLET | Freq: Every day | ORAL | Status: DC
  Administered 2023-07-02 – 2023-07-14 (×13): 100 mg via ORAL
  Filled 2023-07-01 (×13): qty 1

## 2023-07-01 MED ORDER — INSULIN ASPART 100 UNIT/ML IJ SOLN: 0.0000 [IU] | Freq: Every day | INTRAMUSCULAR | Status: DC

## 2023-07-01 NOTE — Progress Notes (Signed)
 NAME:  Brendan Preston, MRN:  [id number], DOB:  1975-05-22, LOS: 18 ADMISSION DATE:  06/13/2023, CONSULTATION DATE:  06/13/23 REFERRING MD:  Dr. Doran Durand, CHIEF COMPLAINT:  AMS   History of Present Illness:  Pt encephalopathic, therefore HPI obtained from chart review.  See additional chart w/MRN [id number]  17 yoM with prior hx of known polysubstance (heroin/ fentanyl) abuse in which EMS was called out for AMS, last seen awake 3 days ago covered in urine and feces.   In ER, temp 104.3 rectal, tachycardic, normo to slightly hypertensive, sats 100% on NRB> but maintaining sats on RA with GCS ~10 but protecting airway.  Labs significant for lactic 8.4, WBC 15, plts 96, VBG 7.29/ 44,  CXR neg, RVP neg, EKG without ST changes, qtc borderline, CTH official read pending, trop hs 694, INR 1.3.  Foley placed- urine cloudy, glucose 150, Hgb 150 (-RBC), neg leuks/nitrates, WBC 6-10, UDS positive for THC and opiates.  CMET and other labs had to be redrawn and still awaiting due to hemolysis.  Cultures sent, empiric vanc/ cefepime given, 2L LR.  PCCM called for admit.  Pertinent  Medical History  Tobacco abuse, polysubstance abuse/ IVDA, ETOH unclear  Significant Hospital Events: Including procedures, antibiotic start and stop dates in addition to other pertinent events   3/7 admit, encephalopathic, altered, drug rash developed after cefepime BLOOD UCLURE MRSA 3/8 TTE negative for endocarditis 3/10 he had a TEE scheduled but sounds like he has declined this . Larey Seat out of bed late day shift and broke his nose. Bcx still pos. 3/11 Tubed in afternoon. CRP high.  3/12 changing his pain reg/ PAD 3/13 on vent 3/14 CT abdomen showed left pneumothorax, bilateral lower lobe consolidation, multiple cavitary lesions, suspicious for bilateral common iliac DVT, fluid collection along L4/L5 vertebral bodies 3/14 left pigtail chest tube for pneumothorax 3/15 started on IV heparin, venous duplex negative, MRI lumbar  spine suggests L5 osteo/no epidural abscess 3/17 changing IV heparin to LMWH as heparin levels not detectable. Still having marked trach secretions. Growing PA so added meropenem (given his rash w/ cefepime). Changed fent to dilaudid at 2mg /hr. CT clamped.  3/18 hgb down to 6.6 got 1 unit PRBC. No obvious bleeding source. Was >13 liters positive so also starting diuretics. Dec'd dilaudid dose to 1.5mg /hr. no PTX so CT removed.  3/19 tolerated 7 hours PSV 10/5 but had quite a lot of secretions requiring frequent suctioning CHES TTUBE REMOVED 3/21 - Temp 99.5. On 30% FiO2. Diuresed well overnight, 5L past 24 hours but still +8L net since admit. 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 On and off low grade fever 101F. On vent 30% fio2/ Per RN and patient - c/p severe cough an donly lidocaine neb helps. C/oc copious resp secretions. PER RN - due to copious secretions patient heading for trach 3/24 extubated  Interim History / Subjective:   Has done well postextubation Continues to cough moderate amount of sputum Febrile 102.8 but overall curve decreasing  Objective   Blood pressure (!) 153/80, pulse (!) 106, temperature 99.3 F (37.4 C), temperature source Axillary, resp. rate (!) 22, height 5\' 11"  (1.803 m), weight 59 kg, SpO2 94%.    Vent Mode: CPAP;PSV FiO2 (%):  [21 %-35 %] 21 %  PEEP:  [5 cmH20] 5 cmH20 Pressure Support:  [5 cmH20] 5 cmH20   Intake/Output Summary (Last 24 hours) at 07/01/2023 0939 Last data filed at 07/01/2023 0645 Gross per 24 hour  Intake 1470.29 ml  Output 1725 ml  Net -254.71 ml   Filed Weights   06/29/23 0407 06/30/23 0457 07/01/23 0416  Weight: 63.4 kg 63.1 kg 59 kg    Examination:   General Appearance: Thin, improving critically ill Head:  Normocephalic, without obvious abnormality, atraumatic Eyes: Mild pallor, no icterus Neck:  Supple,  No enlargement/tenderness/nodules Lungs: Decreased coughing, scattered crackles, no accessory muscle use Heart:  S1 and S2 normal, no murmur, CVP - no.  Pressors - no Abdomen:  Soft, no masses, no organomegaly Extremities: No deformity, no edema Skin:  intact in exposed areas . Sacral area - not examined Neurologic: Alert, interactive, hoarse voice    Labs show hypokalemia 3.2, BUN/creatinine 35/1.2, increase leukocytosis, stable anemia  Resolved Hospital Problem list   Cefepime drug rash Encephalopathy  AKI  Shock  Pneumothorax. Resolved 3/18 chest tube removed  Assessment & Plan:   Sepsis with MRSA bacteremia, Cavitary pneumonia MRSA and then pseudomonas complicated by septic emboli and vertebral osteomyelitis TEE neg for endocarditis.   - ongoing fevers due to lung absscess /necrotizing pneumonia.  Copious sputum production.  Plan 7 days of Cipro planned until 3/27, continue vancomycin Await recheck tracheal aspirate 06/29/2023.>>  Again growing staph and Pseudomonas  Acute hypoxic respiratory failure due to ARDS with necrotizing MRSA PNA, septic emboli and pseudomonas PNA Left Pneumothorax- s/p chest tube placement 3/14 with subsequent removal 3/18 Severe ongoing cough   Plan Extubated 3/24 Cough Rx  -Lidocaine nebulizerdc'd  -dc Mucomyst      Acute HFrEF (45-50%) Possible PFO -on bubble study 06/19/2023; TEE w possible small PFO grade 1 L to R shunt    Plan Consider repeat TTE after acute illness Cont tele  Hold diuresis, off Levophed    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 -suspected on CT abdomen 3/14, neg LE duplex.  On Lovenox    Plan  Continue low molecular weight heparin, may need repeat study at some point to clarify   Anemia of critical illness -status post PRBC 06/24/2023 and  06/26/2023  -Hemoglobin 8.3 g% and continues downward drift but no active bleeding  Plkan  - Monitor but currently no indication for CT abdomen rule out retroperitoneal bleed -> - - PRBC for hgb </= 6.9gm%      Polysubstance abuse Opiate dependence Positive for opiates and THC on admission  Plan Taper slowly scheduled oxycodone, Klonopin Off Precedex Delirium precautions Start PT   Fall, comminuted nasal bone fx  Plan ENT will see him as outpatient when stable  Protein calorie malnutrition, mild Plan Tolerating oral feeding    Transfer to floor and to [facility] 3/26  Best Practice (right click and "Reselect all SmartList Selections" daily)   Diet/type: tubefeeds  DVT prophylaxis LMWH Pressure ulcer(s): N/A GI prophylaxis: PPI Lines: N/A Foley:  Yes, and it is still needed Code Status:  full code Last date of multidisciplinary goals of care discussion-parents updated periodically      ATTESTATION & SIGNATURE    Cyril Mourning MD. [facility] Bollman. [facility] [place] [facility] Pager : [phone]  If no response to pager , please call [phone] until 7 pm After 7:00 pm call Elink  [phone]   07/01/2023   07/01/2023 9:39 AM    LABS    PULMONARY No results for input(s): "PHART", "  PCO2ART", "PO2ART", "HCO3", "TCO2", "O2SAT" in the last 168 hours.  Invalid input(s): "PCO2", "PO2"  CBC Recent Labs  Lab 06/29/23 0536 06/30/23 0354 07/01/23 0413  HGB 8.3* 7.9* 8.3*  HCT 26.8* 25.8* 26.4*  WBC 10.6* 10.7* 13.9*  PLT 391 345 388    COAGULATION No results for input(s): "INR" in the last 168 hours.  CARDIAC  No results for input(s): "TROPONINI" in the last 168 hours. No results for input(s): "PROBNP" in the last 168 hours.   CHEMISTRY Recent Labs  Lab 06/27/23 0058 06/28/23 0648 06/29/23 0536 06/30/23 0354 07/01/23 0413  NA 137 139 138 139 137  K 3.7 3.5 3.9 3.6 3.2*  CL 99 99 99 101 99  CO2 30 31 29 29 29   GLUCOSE 163* 135* 123* 166* 114*   BUN 23* 26* 33* 45* 35*  CREATININE 1.07 1.07 1.16 1.41* 1.19  CALCIUM 7.3* 7.8* 7.8* 8.0* 7.9*  MG 1.9 2.0 2.0 2.1 2.1  PHOS 4.5 5.1* 4.7* 5.5* 4.6   Estimated Creatinine Clearance: 62.7 mL/min (by C-G formula based on SCr of 1.19 mg/dL).   LIVER Recent Labs  Lab 06/25/23 0615 07/01/23 0413  AST 26 51*  ALT 27 48*  ALKPHOS 63 108  BILITOT 0.5 0.6  PROT 6.0* 8.3*  ALBUMIN <1.5* 1.9*     INFECTIOUS No results for input(s): "LATICACIDVEN", "PROCALCITON" in the last 168 hours.   ENDOCRINE CBG (last 3)  Recent Labs    06/30/23 2317 07/01/23 0323 07/01/23 0728  GLUCAP 125* 130* 117*

## 2023-07-01 NOTE — Evaluation (Signed)
 Physical Therapy Evaluation Patient Details Name: Koltan Portocarrero MRN: [id number] DOB: 02-04-1976 Today's Date: 07/01/2023  History of Present Illness  43 yoM admitted 06/13/23 with AMS, fever, tachycardic, normo to slightly hypertensive . Patient intubated 3/13-3/24.PMH: polysubstance abuse in which EMS  Clinical Impression  Pt admitted with above diagnosis.  Pt currently with functional limitations due to the deficits listed below (see PT Problem List). Pt will benefit from acute skilled PT to increase their independence and safety with mobility to allow discharge.     The patient is profoundly weak. Patient did participate  in mobility and sat on bed edge x ~ 5 ". Patient unable to stand due to weakness. Patient is independent PTA. Patient will benefit from intensive inpatient follow-up therapy, >3 hours/day. Patient remained on 2 LPM, SPO2 > 91%, HR 110-112.        If plan is discharge home, recommend the following: Two people to help with walking and/or transfers;A lot of help with bathing/dressing/bathroom;Assistance with cooking/housework;Help with stairs or ramp for entrance;Assist for transportation   Can travel by private vehicle        Equipment Recommendations  (TBA)  Recommendations for Other Services       Functional Status Assessment Patient has had a recent decline in their functional status and demonstrates the ability to make significant improvements in function in a reasonable and predictable amount of time.     Precautions / Restrictions Precautions Precautions: Fall Restrictions Weight Bearing Restrictions Per Provider Order: No      Mobility  Bed Mobility Overal bed mobility: Needs Assistance Bed Mobility: Rolling, Sidelying to Sit, Sit to Supine Rolling: Mod assist Sidelying to sit: Mod assist   Sit to supine: Mod assist   General bed mobility comments: patient required support  for legs and trunk    Transfers                   General  transfer comment: unable to attempt  to stand    Ambulation/Gait                  Stairs            Wheelchair Mobility     Tilt Bed    Modified Rankin (Stroke Patients Only)       Balance Overall balance assessment: Needs assistance Sitting-balance support: Bilateral upper extremity supported, Feet supported Sitting balance-Leahy Scale: Poor Sitting balance - Comments: leans forward                                     Pertinent Vitals/Pain Pain Assessment Pain Assessment: Faces Faces Pain Scale: Hurts whole lot Pain Location: back and throat Pain Descriptors / Indicators: Aching, Grimacing Pain Intervention(s): Patient requesting pain meds-RN notified    Home Living Family/patient expects to be discharged to:: Private residence Living Arrangements: Non-relatives/Friends   Type of Home: House         Home Layout: One level Home Equipment: None      Prior Function Prior Level of Function : Independent/Modified Independent                     Extremity/Trunk Assessment   Upper Extremity Assessment Upper Extremity Assessment: Generalized weakness    Lower Extremity Assessment Lower Extremity Assessment: Generalized weakness    Cervical / Trunk Assessment Cervical / Trunk Assessment: Normal  Communication   Communication Factors Affecting Communication:  Reduced clarity of speech    Cognition Arousal: Alert Behavior During Therapy: WFL for tasks assessed/performed   PT - Cognitive impairments: No apparent impairments                         Following commands: Intact       Cueing       General Comments      Exercises     Assessment/Plan    PT Assessment Patient needs continued PT services  PT Problem List Decreased strength;Cardiopulmonary status limiting activity;Decreased activity tolerance;Decreased balance;Decreased mobility;Decreased knowledge of precautions;Decreased knowledge of use  of DME;Decreased safety awareness;Decreased range of motion       PT Treatment Interventions DME instruction;Therapeutic exercise;Gait training;Functional mobility training;Therapeutic activities;Patient/family education    PT Goals (Current goals can be found in the Care Plan section)  Acute Rehab PT Goals Patient Stated Goal: to get up PT Goal Formulation: With patient Time For Goal Achievement: 07/15/23 Potential to Achieve Goals: Fair    Frequency Min 2X/week     Co-evaluation               AM-PAC PT "6 Clicks" Mobility  Outcome Measure Help needed turning from your back to your side while in a flat bed without using bedrails?: A Lot Help needed moving from lying on your back to sitting on the side of a flat bed without using bedrails?: A Lot Help needed moving to and from a bed to a chair (including a wheelchair)?: Total Help needed standing up from a chair using your arms (e.g., wheelchair or bedside chair)?: Total Help needed to walk in hospital room?: Total Help needed climbing 3-5 steps with a railing? : Total 6 Click Score: 8    End of Session   Activity Tolerance: Patient limited by fatigue Patient left: in bed;with call bell/phone within reach;with bed alarm set Nurse Communication: Mobility status;Need for lift equipment PT Visit Diagnosis: Unsteadiness on feet (R26.81);Muscle weakness (generalized) (M62.81);Difficulty in walking, not elsewhere classified (R26.2);Other abnormalities of gait and mobility (R26.89)    Time: 6644-0347 PT Time Calculation (min) (ACUTE ONLY): 18 min   Charges:   PT Evaluation $PT Eval Low Complexity: 1 Low   PT General Charges $$ ACUTE PT VISIT: 1 Visit         [name] PT Acute Rehabilitation Services Office [phone] Weekend [phone]   [name] 07/01/2023, 4:26 PM

## 2023-07-01 NOTE — Plan of Care (Signed)
  Problem: Education: Goal: Knowledge of General Education information will improve Description: Including pain rating scale, medication(s)/side effects and non-pharmacologic comfort measures Outcome: Progressing   Problem: Clinical Measurements: Goal: Respiratory complications will improve Outcome: Progressing Goal: Cardiovascular complication will be avoided Outcome: Progressing   Problem: Activity: Goal: Risk for activity intolerance will decrease Outcome: Progressing   Problem: Nutrition: Goal: Adequate nutrition will be maintained Outcome: Progressing   Problem: Coping: Goal: Level of anxiety will decrease Outcome: Progressing   Problem: Elimination: Goal: Will not experience complications related to urinary retention Outcome: Progressing   Problem: Pain Managment: Goal: General experience of comfort will improve and/or be controlled Outcome: Progressing   Problem: Safety: Goal: Ability to remain free from injury will improve Outcome: Progressing

## 2023-07-01 NOTE — Progress Notes (Signed)
 [facility]  Date of Admission:  06/13/2023     Lines: 3/13-c Rue picc 3/07-c foley catheter  Abx: 3/25-c piptazo 3/07-c vancomycin   3/19-3/25 cipro 3/17-19 meropenem 3/07-09 ceftriaxone   ASSESSMENT: 48 yo male polysubstance abuse (heroin/fentanyl), biba for ams found to have severe sepsis and mrsa bacteremia, complicated by respiratory failure  Other ID problems include: Bilateral pulm nodules presumed mrsa L5 vertebral om with epidural phlegmon Ongoing fever and HAP with pseudomonas aeruginosa Rash on cefepime  Other issues: Left pneumothorax s/p chest tube 3/14-3/19 Bilateral common iliac veins filling defects on abd pelv ct -- on tx dose enoxoparin Resp failure extubated 3/24    #mrsa bsi #bilateral pulm septic nodules #l4-5 vertebral om and epidural phlegmon 3/07 bcx mrsa 3/07 urine cx negative 3/09 bcx negative  3/08 Tte and 3/13 tee no sign of valve vegetation 3/14 abd pelv ct with contrast bilateral common iliac vein filling defect but no occult abscess 3/15 thoracic and lumbar spine L4-5 vertebral om and epidural phlegmon  3/25 still fever ?pseudomonas vs occult abscess in abd/pelv  -continue vanc -will give another 3 days with pseudomonal coverage change before considering repeat abd pelv ct -agree with prolong treatment at least 8 weeks abx given vertebral osteomyelitis    #hap/vap 3/16 sputum cx with PsA (S cefepime/ceftaz,cipro, imipenem); and mrsa 3/23 repeat sputum cx pseudomonas resistant to cipro/imipenem  -patient takes amox previously -- our pharmacy team confirmed with his mother -will switch cipro to piptazo   #ongoing fever Ddx drug fever, occult abscess from mrsa, nosocomial line infection such as yeast or other bacteria, acalculous cholecystitis, cdiff Diarrhea in setting tube feed with abx -- 3/24 denies abd pain n/v 3/24 resend blood cx No rash; Slight bump renal function today 3/24;  checking lft tomorrow but no abd pain/tenderness 3/23 resp cx still with gpc and gnr   -f/u repeat bcx (ngtd) -changing abx to piptazo from cipro; plan 7 more days from 3/25 today -lft/cbc with diff sent for the next 3 days -will consider repeat abd pelv ct with contrast in the next few days if ongoing fever   #ams resolved Initially at admission Head ct unremarkable 3/07 lp me panel negative; no cx; protein >600; glucose 87; wbc clotted   PLAN: F/u repeat bcx Continue trending lft/cbc diff Planned 7 days 3/25-3/31 piptazo Monitor for diarrhea/cdiff Continue vanc for mrsa infection -- 8 weeks from 3/09 and potential to transition to oral abx after 4 weeks If ongoing fever for the next 3 days will repeat abd pelv ct with contrast Maintain contact infection prevention Supportive care otherwise per pulm/ccm Discussed with pulm/ccm dr Vassie Loll       Principal Problem:   MRSA bacteremia Active Problems:   Severe sepsis (HCC)   Septic embolism (HCC)   Malnutrition of moderate degree   Respiratory failure (HCC)   Diskitis   IVDU (intravenous drug user)   Allergies  Allergen Reactions   Cefepime Rash    Scheduled Meds:  Chlorhexidine Gluconate Cloth  6 each Topical Daily   chlorpheniramine-HYDROcodone  5 mL Oral Q12H   clonazePAM  1 mg Oral BID   enoxaparin (LOVENOX) injection  60 mg Subcutaneous Q12H   feeding supplement  237 mL Oral TID BM   guaiFENesin  20 mL Oral BID   insulin aspart  0-9 Units Subcutaneous Q4H   lidocaine  1 patch Transdermal Q24H   multivitamin with minerals  1 tablet Oral  Daily   oxyCODONE  10 mg Oral QID   pantoprazole  40 mg Oral Daily   polyethylene glycol  17 g Oral Daily   senna-docusate  2 tablet Oral BID   sodium chloride flush  10-40 mL Intracatheter Q12H   sodium chloride flush  3-10 mL Intravenous Q12H   thiamine  100 mg Oral Daily   Continuous Infusions:  piperacillin-tazobactam (ZOSYN)  IV 3.375 g (07/01/23 1447)   vancomycin  (VANCOCIN) 500 mg in sodium chloride 0.9 % 100 mL IVPB Stopped (07/01/23 1317)   PRN Meds:.acetaminophen (TYLENOL) oral liquid 160 mg/5 mL, acetaminophen, albuterol, bisacodyl, ibuprofen, lidocaine, methocarbamol, metoprolol tartrate, mouth rinse, mouth rinse, mouth rinse, phenol, sodium chloride flush, sodium chloride flush   SUBJECTIVE: Ongoing fever Rectal tube remains loose stool No other complaint  Transfering out of icu  Review of Systems: ROS All other ROS was negative, except mentioned above     OBJECTIVE: Vitals:   07/01/23 1100 07/01/23 1200 07/01/23 1300 07/01/23 1400  BP: (!) 153/82 (!) 154/88 138/86 (!) 174/93  Pulse: (!) 104 (!) 111 99 (!) 105  Resp: (!) 24 16 (!) 22 (!) 25  Temp:  98.4 F (36.9 C)    TempSrc:  Axillary    SpO2: 93% 91% 92% 92%  Weight:      Height:       Body mass index is 18.14 kg/m.  Physical Exam General/constitutional: no distress, pleasant; writing questions down -- just extubated today so nonverbal (complains of some sorethroat) HEENT: Normocephalic, PER, Conj Clear, EOMI, Oropharynx clear Neck supple CV: rrr no mrg Lungs: clear to auscultation, normal respiratory effort Abd: Soft, Nontender Ext: no edema Skin: No Rash Neuro: nonfocal MSK: no peripheral joint swelling/tenderness/warmth; back spines nontender   Central line presence: right upper ext picc site no erythema/purulence/tenderness   Lab Results Lab Results  Component Value Date   WBC 13.9 (H) 07/01/2023   HGB 8.3 (L) 07/01/2023   HCT 26.4 (L) 07/01/2023   MCV 88.0 07/01/2023   PLT 388 07/01/2023    Lab Results  Component Value Date   CREATININE 1.19 07/01/2023   BUN 35 (H) 07/01/2023   NA 137 07/01/2023   K 3.2 (L) 07/01/2023   CL 99 07/01/2023   CO2 29 07/01/2023    Lab Results  Component Value Date   ALT 48 (H) 07/01/2023   AST 51 (H) 07/01/2023   ALKPHOS 108 07/01/2023   BILITOT 0.6 07/01/2023      Microbiology: Recent Results (from the  past 240 hours)  Culture, Respiratory w Gram Stain     Status: None   Collection Time: 06/22/23  8:31 AM   Specimen: Tracheal Aspirate; Respiratory  Result Value Ref Range Status   Specimen Description   Final    TRACHEAL ASPIRATE Performed at [facility], [address], [place], [place] [address]    Special Requests   Final    NONE Performed at [facility], [address], [place], [place] [address]    Gram Stain   Final    RARE WBC PRESENT, PREDOMINANTLY PMN FEW GRAM POSITIVE COCCI FEW GRAM NEGATIVE RODS Performed at [facility], [address], [place], [place] [address]    Culture   Final    ABUNDANT PSEUDOMONAS AERUGINOSA ABUNDANT METHICILLIN RESISTANT STAPHYLOCOCCUS AUREUS    Report Status 06/24/2023 FINAL  Final   Organism ID, Bacteria PSEUDOMONAS AERUGINOSA  Final   Organism ID, Bacteria METHICILLIN RESISTANT STAPHYLOCOCCUS AUREUS  Final      Susceptibility   Methicillin resistant staphylococcus aureus - MIC*    CIPROFLOXACIN >=8 RESISTANT Resistant     ERYTHROMYCIN >=8 RESISTANT Resistant     GENTAMICIN <=0.5 SENSITIVE Sensitive     OXACILLIN >=4 RESISTANT Resistant     TETRACYCLINE <=1 SENSITIVE Sensitive     VANCOMYCIN 1 SENSITIVE Sensitive     TRIMETH/SULFA 160 RESISTANT Resistant     CLINDAMYCIN <=0.25 SENSITIVE Sensitive     RIFAMPIN <=0.5 SENSITIVE Sensitive     Inducible Clindamycin NEGATIVE Sensitive     LINEZOLID 2 SENSITIVE Sensitive     * ABUNDANT METHICILLIN RESISTANT STAPHYLOCOCCUS AUREUS   Pseudomonas aeruginosa - MIC*    CEFTAZIDIME 4 SENSITIVE Sensitive     CIPROFLOXACIN <=0.25 SENSITIVE Sensitive     GENTAMICIN 4 SENSITIVE Sensitive     IMIPENEM 2 SENSITIVE Sensitive     PIP/TAZO 16 SENSITIVE Sensitive ug/mL    CEFEPIME 2 SENSITIVE Sensitive     * ABUNDANT PSEUDOMONAS AERUGINOSA  Culture, Respiratory w Gram Stain     Status: None   Collection Time: 06/29/23  1:25 PM   Specimen: Tracheal  Aspirate; Respiratory  Result Value Ref Range Status   Specimen Description   Final    TRACHEAL ASPIRATE Performed at [facility], [address], [place], [place] [address]    Special Requests   Final    NONE Performed at [facility], [address], [place], [place] [address]    Gram Stain   Final    NO WBC SEEN RARE GRAM POSITIVE COCCI RARE GRAM NEGATIVE RODS    Culture   Final    ABUNDANT METHICILLIN RESISTANT STAPHYLOCOCCUS AUREUS FEW PSEUDOMONAS AERUGINOSA HEALTH DEPARTMENT NOTIFIED Performed at [facility], [address] [place], [place] [address]    Report Status 07/01/2023 FINAL  Final   Organism ID, Bacteria METHICILLIN RESISTANT STAPHYLOCOCCUS AUREUS  Final   Organism ID, Bacteria PSEUDOMONAS AERUGINOSA  Final      Susceptibility   Methicillin resistant staphylococcus aureus - MIC*    CIPROFLOXACIN >=8 RESISTANT Resistant     ERYTHROMYCIN >=8 RESISTANT Resistant     GENTAMICIN <=0.5 SENSITIVE Sensitive     OXACILLIN >=4 RESISTANT Resistant     TETRACYCLINE <=1 SENSITIVE Sensitive     VANCOMYCIN 1 SENSITIVE Sensitive     TRIMETH/SULFA >=320 RESISTANT Resistant     CLINDAMYCIN <=0.25 SENSITIVE Sensitive     RIFAMPIN <=0.5 SENSITIVE Sensitive     Inducible Clindamycin NEGATIVE Sensitive     LINEZOLID 2 SENSITIVE Sensitive     * ABUNDANT METHICILLIN RESISTANT STAPHYLOCOCCUS AUREUS   Pseudomonas aeruginosa - MIC*    CEFTAZIDIME 2 SENSITIVE Sensitive     CIPROFLOXACIN 2 RESISTANT Resistant     GENTAMICIN <=1 SENSITIVE Sensitive     IMIPENEM >=16 RESISTANT Resistant     PIP/TAZO <=4 SENSITIVE Sensitive ug/mL    CEFEPIME 2 SENSITIVE Sensitive     * FEW PSEUDOMONAS AERUGINOSA  Culture, blood (Routine X 2) w Reflex to ID Panel     Status: None (Preliminary result)   Collection Time: 06/30/23 10:22 AM   Specimen: BLOOD  Result Value Ref Range Status   Specimen Description   Final    BLOOD BLOOD LEFT HAND AEROBIC  BOTTLE ONLY Performed at [facility], [address] [place], [place] [address]    Special Requests   Final    BOTTLES DRAWN AEROBIC ONLY Blood Culture results may not  be optimal due to an inadequate volume of blood received in culture bottles Performed at [facility], [address] [place], [place] [address]    Culture   Final    NO GROWTH < 24 HOURS Performed at [facility], [address] [place], [place] [address]    Report Status PENDING  Incomplete  Culture, blood (Routine X 2) w Reflex to ID Panel     Status: None (Preliminary result)   Collection Time: 06/30/23 10:28 AM   Specimen: BLOOD  Result Value Ref Range Status   Specimen Description   Final    BLOOD BLOOD LEFT HAND AEROBIC BOTTLE ONLY Performed at [facility], [address] [place], [place] [address]    Special Requests   Final    BOTTLES DRAWN AEROBIC ONLY Blood Culture results may not be optimal due to an inadequate volume of blood received in culture bottles Performed at [facility], [address], [place], [place] [address]    Culture   Final    NO GROWTH < 24 HOURS Performed at [facility], [address] [place], [place] [address]    Report Status PENDING  Incomplete     Serology:   Imaging: If present, new imagings (plain films, ct scans, and mri) have been personally visualized and interpreted; radiology reports have been reviewed. Decision making incorporated into the Impression / Recommendations.  06/21/23 mri lumbar spine wwo contrast 1. Abnormal edema and enhancement affecting the L5 vertebral body which could go along with osteomyelitis. The L4-5 and L5-S1 disc spaces are normal. 2. Abnormal appearance of the paravertebral soft tissues surrounding L5 including the epidural space at L5-S1 with edema and enhancement consistent with inflammatory change. The epidural changes are phlegmonous at this  point in time without what I would characterize as a frank epidural abscess.   3/15 mri thoracic spine wwo contrast 1. No evidence of discitis, osteomyelitis or septic facet arthritis. 2. Extensive patchy bilateral pulmonary pathology, not well evaluated using this technique.   3/14 ct abd pelv with contrast 1. Incompletely visualized small to moderate left pneumothorax at the left lung base. 2. Negative for rim enhancing fluid collection within the abdomen or pelvis to suggest drainable abscess. 3. Contracted gallbladder with possible wall thickening but no calcified stones. 4. Persistent bilateral lower lobe consolidations with areas of heterogeneous low density suggestive of necrotic infection. Multiple cavitary lung lesions corresponding to history of septic emboli. Small bilateral pleural effusions. 5. Suspicion of hypodense filling defect within the bilateral common iliac veins right greater than left, suspicious for DVT. 6. Question small volume low-density fluid along the right anterior margins of the L4 and L5 vertebral bodies. No overt osseous destructive change or abnormal appearance of the disc spaces but presence of spine infection cannot be excluded and correlation with MRI should be considered. 7. Small free fluid within the abdomen and pelvis. Presacral soft tissue stranding and small volume fluid. Mild generalized subcutaneous edema. 8. Slightly thick-walled urinary bladder, decompressed by Foley catheter.    3/13 tee  1. Left ventricular ejection fraction, by estimation, is 45 to 50%. The  left ventricle has mildly decreased function.   2. Right ventricular systolic function is normal. The right ventricular  size is normal.   3. No left atrial/left atrial appendage thrombus was detected.   4. The mitral valve is grossly normal. Trivial mitral valve  regurgitation.   5. The aortic valve is tricuspid. Aortic  valve regurgitation is not  visualized.   6.  Agitated saline contrast bubble study was positive with shunting  observed within 3-6 cardiac cycles suggestive of interatrial shunt, Grade  1.   Conclusion(s)/Recommendation(s): No evidence of vegetation/infective  endocarditis on this transesophageael echocardiogram.      3/9 ct chest without contrast 1. Very extensive heterogeneous and consolidative airspace disease throughout the lungs, much of this nodular in appearance, with several cavitary nodules present. Findings are highly suspicious for septic emboli given stated clinical concern. 2. Small bilateral pleural effusions. 3. Cardiomegaly.   3/08 tte  1. Left ventricular ejection fraction, by estimation, is 40 to 45%. The  left ventricle has mildly decreased function. The left ventricle  demonstrates global hypokinesis. The left ventricular internal cavity size  was mildly dilated. There is mild left ventricular hypertrophy. Left ventricular diastolic parameters were normal.   2. Right ventricular systolic function is moderately reduced. The right  ventricular size is moderately enlarged. Tricuspid regurgitation signal is  inadequate for assessing PA pressure.   3. The mitral valve is abnormal. Trivial mitral valve regurgitation. No  evidence of mitral stenosis.   4. The aortic valve is tricuspid. Aortic valve regurgitation is not  visualized. No aortic stenosis is present.   5. The inferior vena cava is normal in size with greater than 50%  respiratory variability, suggesting right atrial pressure of 3 mmHg.   6. Agitated saline contrast bubble study was negative, with no evidence  of any interatrial shunt.      Raymondo Band, MD [facility] [phone] pager    07/01/2023, 4:37 PM

## 2023-07-01 NOTE — Progress Notes (Signed)
 Nutrition Follow-up  DOCUMENTATION CODES:   Non-severe (moderate) malnutrition in context of chronic illness  INTERVENTION:  - Regular diet.  - Ensure Plus High Protein po TID, each supplement provides 350 kcal and 20 grams of protein. - Encourage intake at all meals and of supplements. - Multivitamin with minerals daily - Monitor weight trends.   NUTRITION DIAGNOSIS:   Moderate Malnutrition related to chronic illness (polysubstance abuse) as evidenced by mild fat depletion, mild muscle depletion. *ongoing  GOAL:   Patient will meet greater than or equal to 90% of their needs *progressing  MONITOR:   Vent status, Labs, Weight trends, TF tolerance  REASON FOR ASSESSMENT:   Ventilator    ASSESSMENT:   48 yo male with prior hx of known polysubstance (heroin/ fentanyl) abuse admitted for AMS.  3/7 Admit 3/11 Intubated; OGT placed 3/12 Vital 1.5 started @ 20 3/24 Extubated; Regular diet  Patient sleepy at time of visit. He reports tolerating food well. Documented to have had 50% of dinner yesterday and consumed most of his breakfast this AM.  He is agreeable to Ensure to support intake. Will also add multivitamin given history of polysubstance abuse.   Admit weight: 147# Current weight: 130# (was 139# yesterday) I&O's: -5.2L since 3/11   Medications reviewed and include: Miralax, Senokot, 100mg  thiamine   Labs reviewed:  K+ 3.2   Diet Order:   Diet Order             Diet regular Room service appropriate? Yes; Fluid consistency: Thin  Diet effective now                   EDUCATION NEEDS:  Not appropriate for education at this time  Skin:  Skin Assessment: Reviewed RN Assessment  Last BM:  3/25 - rectal tube  Height:  Ht Readings from Last 1 Encounters:  06/28/23 5\' 11"  (1.803 m)   Weight:  Wt Readings from Last 1 Encounters:  07/01/23 59 kg    BMI:  Body mass index is 18.14 kg/m.  Estimated Nutritional Needs:  Kcal:  2000-2350  kcals Protein:  100-120 grams Fluid:  >/= 2L    Shelle Iron RD, LDN Contact via Secure Chat.

## 2023-07-01 NOTE — Progress Notes (Signed)
 PT Cancellation Note  Patient Details Name: Brendan Preston MRN: [id number] DOB: July 05, 1975   Cancelled Treatment:    Reason Eval/Treat Not Completed: Patient declined, no reason specified Patient states that he is have a coughing fit, Will check back in PM. [name] PT Acute Rehabilitation Services Office [phone] Weekend [phone]   [name] 07/01/2023, 12:17 PM

## 2023-07-01 NOTE — Progress Notes (Signed)
 Inpatient Rehab Admissions Coordinator Note:   Per therapy recommendations patient was screened for CIR candidacy by Stephania Fragmin, PT. At this time, pt appears to be a potential candidate for CIR. I will place an order for rehab consult for full assessment, per our protocol.  Please contact me any with questions.Estill Dooms, PT, DPT [phone] 07/01/23 4:59 PM

## 2023-07-01 NOTE — Progress Notes (Signed)
 [facility] ADULT ICU REPLACEMENT PROTOCOL   The patient does apply for the [facility] Adult ICU Electrolyte Replacment Protocol based on the criteria listed below:   1.Exclusion criteria: TCTS, ECMO, Dialysis, and Myasthenia Gravis patients 2. Is GFR >/= 30 ml/min? Yes.    Patient's GFR today is >60 3. Is SCr </= 2? Yes.   Patient's SCr is 1.19 mg/dL 4. Did SCr increase >/= 0.5 in 24 hours? No. 5.Pt's weight >40kg  Yes.   6. Abnormal electrolyte(s): k 3.2  7. Electrolytes replaced per protocol 8.  Call MD STAT for K+ </= 2.5, Phos </= 1, or Mag </= 1 Physician:    Markus Daft A 07/01/2023 5:58 AM

## 2023-07-01 NOTE — Progress Notes (Signed)
 eLink Physician-Brief Progress Note Patient Name: Brendan Preston DOB: [birth year] MRN: [id number]   Date of Service  07/01/2023  HPI/Events of Note  c/o 8/10 pain.  not able to get oxy til 10AM.   Receives Oxy 10 every 4 hours and 4 times a day in addition.  eICU Interventions   Additional dose of oxycodone x 1     Intervention Category Intermediate Interventions: Pain - evaluation and management  Lillith Mcneff 07/01/2023, 4:33 AM

## 2023-07-02 DIAGNOSIS — E440 Moderate protein-calorie malnutrition: Secondary | ICD-10-CM

## 2023-07-02 DIAGNOSIS — R7881 Bacteremia: Secondary | ICD-10-CM

## 2023-07-02 DIAGNOSIS — F1990 Other psychoactive substance use, unspecified, uncomplicated: Secondary | ICD-10-CM

## 2023-07-02 DIAGNOSIS — M4646 Discitis, unspecified, lumbar region: Secondary | ICD-10-CM

## 2023-07-02 LAB — BASIC METABOLIC PANEL WITH GFR
Anion gap: 9 (ref 5–15)
BUN: 25 mg/dL — ABNORMAL HIGH (ref 6–20)
CO2: 27 mmol/L (ref 22–32)
Calcium: 8.4 mg/dL — ABNORMAL LOW (ref 8.9–10.3)
Chloride: 101 mmol/L (ref 98–111)
Creatinine, Ser: 1.08 mg/dL (ref 0.61–1.24)
GFR, Estimated: >60 mL/min (ref >60)
Glucose, Bld: 131 mg/dL — ABNORMAL HIGH (ref 70–99)
Potassium: 3.7 mmol/L (ref 3.5–5.1)
Sodium: 137 mmol/L (ref 135–145)

## 2023-07-02 LAB — CBC WITH DIFFERENTIAL/PLATELET
Abs Immature Granulocytes: 0.18 10*3/uL — ABNORMAL HIGH (ref 0.00–0.07)
Basophils Absolute: 0.1 10*3/uL (ref 0.0–0.1)
Basophils Relative: 0 %
Eosinophils Absolute: 0.1 10*3/uL (ref 0.0–0.5)
Eosinophils Relative: 1 %
HCT: 27.8 % — ABNORMAL LOW (ref 39.0–52.0)
Hemoglobin: 8.6 g/dL — ABNORMAL LOW (ref 13.0–17.0)
Immature Granulocytes: 1 %
Lymphocytes Relative: 15 %
Lymphs Abs: 2.4 10*3/uL (ref 0.7–4.0)
MCH: 27.2 pg (ref 26.0–34.0)
MCHC: 30.9 g/dL (ref 30.0–36.0)
MCV: 88 fL (ref 80.0–100.0)
Monocytes Absolute: 1 10*3/uL (ref 0.1–1.0)
Monocytes Relative: 6 %
Neutro Abs: 11.8 10*3/uL — ABNORMAL HIGH (ref 1.7–7.7)
Neutrophils Relative %: 77 %
Platelets: 427 10*3/uL — ABNORMAL HIGH (ref 150–400)
RBC: 3.16 MIL/uL — ABNORMAL LOW (ref 4.22–5.81)
RDW: 16.7 % — ABNORMAL HIGH (ref 11.5–15.5)
WBC: 15.4 10*3/uL — ABNORMAL HIGH (ref 4.0–10.5)
nRBC: 0 % (ref 0.0–0.2)

## 2023-07-02 LAB — GLUCOSE, CAPILLARY
Glucose-Capillary: 120 mg/dL — ABNORMAL HIGH (ref 70–99)
Glucose-Capillary: 146 mg/dL — ABNORMAL HIGH (ref 70–99)
Glucose-Capillary: 142 mg/dL — ABNORMAL HIGH (ref 70–99)
Glucose-Capillary: 105 mg/dL — ABNORMAL HIGH (ref 70–99)

## 2023-07-02 LAB — HEPATIC FUNCTION PANEL
ALT: 49 U/L — ABNORMAL HIGH (ref 0–44)
AST: 48 U/L — ABNORMAL HIGH (ref 15–41)
Albumin: 1.9 g/dL — ABNORMAL LOW (ref 3.5–5.0)
Alkaline Phosphatase: 117 U/L (ref 38–126)
Bilirubin, Direct: 0.1 mg/dL (ref 0.0–0.2)
Indirect Bilirubin: 0.4 mg/dL (ref 0.3–0.9)
Total Bilirubin: 0.5 mg/dL (ref 0.0–1.2)
Total Protein: 8.6 g/dL — ABNORMAL HIGH (ref 6.5–8.1)

## 2023-07-02 LAB — PHOSPHORUS: Phosphorus: 4.1 mg/dL (ref 2.5–4.6)

## 2023-07-02 LAB — MAGNESIUM: Magnesium: 2.2 mg/dL (ref 1.7–2.4)

## 2023-07-02 MED ORDER — CARVEDILOL 3.125 MG PO TABS: 3.1250 mg | ORAL_TABLET | Freq: Two times a day (BID) | ORAL | Status: DC

## 2023-07-02 NOTE — Assessment & Plan Note (Deleted)
 Present on admission

## 2023-07-02 NOTE — Plan of Care (Signed)
  Problem: Health Behavior/Discharge Planning: Goal: Ability to manage health-related needs will improve Outcome: Progressing   Problem: Clinical Measurements: Goal: Ability to maintain clinical measurements within normal limits will improve Outcome: Progressing Goal: Respiratory complications will improve Outcome: Progressing Goal: Cardiovascular complication will be avoided Outcome: Progressing   Problem: Nutrition: Goal: Adequate nutrition will be maintained Outcome: Progressing   Problem: Coping: Goal: Level of anxiety will decrease Outcome: Progressing   Problem: Elimination: Goal: Will not experience complications related to urinary retention Outcome: Progressing   Problem: Pain Managment: Goal: General experience of comfort will improve and/or be controlled Outcome: Progressing   Problem: Safety: Goal: Ability to remain free from injury will improve Outcome: Progressing   Problem: Skin Integrity: Goal: Risk for impaired skin integrity will decrease Outcome: Progressing

## 2023-07-02 NOTE — Assessment & Plan Note (Deleted)
 07-02-2023 pt extubated on 06-30-2023. Currently on 2 L/min. Wean to RA if possible.  07-03-2023 continue to wean O2  07-04-2023 wean O2 if possible.  07-05-2023 attempt to wean. Will repeat CXR today  07-06-2023 CXR shows persistent infiltrate. Likely his left over septic emboli.  Ordered chest physiotherapy. Pt has a weak cough. Hopefully chest physiotherapy with help him cough up more.   07-07-2023 trying to get pt percussion/vibration vest therapy to help him mobilize his secretions.  07-08-2023 arrangement made to help mobilize his secretions after pt agreed to wear vest. Only for pt to refuse treatment when RT brought in machine.

## 2023-07-02 NOTE — Assessment & Plan Note (Deleted)
 07-02-2023 pt states has used IV drugs in the past. 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 Pt states he was on suboxone in the past. He wants to try and get back on suboxone.  Discussed that his oxycodone dose would not be increased while he is in the hospital. And that he will not be given anymore IV opiates. Discussed with him that it would be best for him to get on suboxone therapy as soon as possible but he would need to stop all opiate therapy for 24-48 hours prior to starting suboxone. He states he will "think about it".  07-03-2023 pt unwilling to change to suboxone. Do NOT increase or give extra doses of oxycodone. Do NOT give any IV opiates/benzos. Start oxycodone taper next week.  07-05-2023 remains on oxycodone QID 10 mg. Pt not willing to try suboxone while in the hospital.  07-06-2023 continue qid oxycodone 10 mg. Do Not give PRN or IV opiates. Do not change his opiate Rx.  07-07-2023 stable. On qid oxycodone 10 mg. Do not give any prn or IV opiates. Pt not interested in weaning off opiates. he is aware he will not get opiate therapy at SNF.  07-08-2023 early this week, pt stated he wanted to get off opiates. Offered to change him to suboxone while he was in the hospital. Only for patient to later state he did not want to change to suboxone. Pt displaying passive/aggressive behavior by stating he wants one therapy only to refuse to participate when offered to him later.(I.e. pt taking a shower, pt wearing chest physiotherapy vest to mobilize secretions).  When his IV vanco ends, opiate tapering should start.

## 2023-07-02 NOTE — Subjective & Objective (Addendum)
 Pt seen and examined. Pt refused chest physiotherapy with vibration vest. Pt refused to get into shower yesterday stating he was too tired and his back hurts. Pt states he wants to do something and when arrangements made for him to complete task, he refuses care.

## 2023-07-02 NOTE — Assessment & Plan Note (Deleted)
 07-02-2023 [facility] assumed care on 07-01-2024. Pt has spent the last 18 prior days on PCCM service. Transesophageal Echo negative for endocarditis. ID states pt will need 8 weeks of IV Vanco. Start date is 06-15-2023.  So end date of therapy is Aug 09, 2023   07-03-2023  continue with IV Zosyn thru 07-07-2023 and IV Vanco through Aug 09, 2023. End date of IV vanco is 08-09-2023.  CM looking into SNF for IV ABX. Pt turned down by CIR.  07-04-2023  continue with IV Zosyn thru 07-07-2023 and IV Vanco through Aug 09, 2023. End date of IV vanco is 08-09-2023.  CM looking into SNF for IV ABX. Pt turned down by CIR.  (Per ID, If pt goes to snf or stay inpatient, then plan 4 weeks iv abx until 07/13/2023 then finish the rest of the 8 weeks with 2 weeks linezolid until 08/06/23, and then 2 weeks doxycycline).   07-05-2023 remains on IV Zosyn and IV vanco. CM looking into SNF for IV abx and rehab. *update from ID recs. 4 weeks iv abx until 07/13/2023 then finish the rest of the 8 weeks with (2 weeks linezolid until 08/06/23, and then 2 weeks doxycycline)   07-06-2023 suppose to be on IV zosyn through 07-07-2023. Then just single agent IV Vanco. Awaiting to see if [facility] can arrange for SNF placement for him.   07-07-2023 today should be last day of IV Zosyn. Then he can just get IV Vanco. Hopefully TOC/CM/SW can find him SNF to take him to complete IV ABX therapy and PT.   07-08-2023 now on single agent IV vanco until 07-13-2023. He can then get 14 days of zyvox 600 mgm bid(through 08-06-2023) and then 14 days of doxycycline 200 mg bid.

## 2023-07-02 NOTE — Assessment & Plan Note (Deleted)
 Nutrition Status: Nutrition Problem: Moderate Malnutrition Etiology: chronic illness (polysubstance abuse) Signs/Symptoms: mild fat depletion, mild muscle depletion Interventions: Refer to RD note for recommendations, Tube feeding

## 2023-07-02 NOTE — Assessment & Plan Note (Deleted)
 07-02-2023 [facility] assumed care on 07-01-2024. Pt has spent the last 18 prior days on PCCM service. Transesophageal Echo negative for endocarditis. ID states pt will need 8 weeks of IV Vanco. Start date is 06-15-2023.  So end date of therapy is Aug 09, 2023  07-03-2023  continue with IV Zosyn thru 07-07-2023 and IV Vanco through Aug 09, 2023. End date of IV vanco is 08-09-2023.  CM looking into SNF for IV ABX. Pt turned down by CIR. Started on low dose coreg to help with his tachycardia. Continue to monitor.  07-04-2023  continue with IV Zosyn thru 07-07-2023 and IV Vanco through Aug 09, 2023. End date of IV vanco is 08-09-2023.  CM looking into SNF for IV ABX. Pt turned down by CIR.  (Per ID, If pt goes to snf or stay inpatient, then plan 4 weeks iv abx until 07/13/2023 then finish the rest of the 8 weeks with 2 weeks linezolid until 08/06/23, and then 2 weeks doxycycline).   07-05-2023 remains on IV Zosyn and IV vanco. CM looking into SNF for IV abx and rehab.  07-06-2023 suppose to be on IV zosyn through 07-07-2023. Then just single agent IV Vanco. Awaiting to see if [facility] can arrange for SNF placement for him.  07-07-2023 today should be last day of IV Zosyn. Then he can just get IV Vanco. Hopefully TOC/CM/SW can find him SNF to take him to complete IV ABX therapy and PT. *update from ID recs. 4 weeks iv abx until 07/13/2023 then finish the rest of the 8 weeks with (2 weeks linezolid until 08/06/23, and then 2 weeks doxycycline)   07-08-2023 now on single agent IV vanco until 07-13-2023. He can then get 14 days of zyvox 600 mgm bid(through 08-06-2023) and then 14 days of doxycycline 200 mg bid.

## 2023-07-02 NOTE — Progress Notes (Signed)
 [facility]  Date of Admission:  06/13/2023     Lines: 3/13-c Rue picc   Abx: 3/25-c piptazo 3/07-c vancomycin   3/19-3/25 cipro 3/17-19 meropenem 3/07-09 ceftriaxone   ASSESSMENT: 48 yo male polysubstance abuse (heroin/fentanyl), biba for ams found to have severe sepsis and mrsa bacteremia, complicated by respiratory failure  Other ID problems include: Bilateral pulm nodules presumed mrsa L5 vertebral om with epidural phlegmon Ongoing fever and HAP with pseudomonas aeruginosa Rash on cefepime  Other issues: Left pneumothorax s/p chest tube 3/14-3/19 Bilateral common iliac veins filling defects on abd pelv ct -- on tx dose enoxoparin Resp failure extubated 3/24    #mrsa bsi #bilateral pulm septic nodules #l4-5 vertebral om and epidural phlegmon 3/07 bcx mrsa 3/07 urine cx negative 3/09 bcx negative  3/08 Tte and 3/13 tee no sign of valve vegetation 3/14 abd pelv ct with contrast bilateral common iliac vein filling defect but no occult abscess 3/15 thoracic and lumbar spine L4-5 vertebral om and epidural phlegmon  3/25 still fever ?pseudomonas vs occult abscess in abd/pelv   3/26 fever curve better   -continue vanc -will need prolong treatment at least 8 weeks abx given vertebral osteomyelitis    #hap/vap 3/16 sputum cx with PsA (S cefepime/ceftaz,cipro, imipenem); and mrsa 3/23 repeat sputum cx pseudomonas resistant to cipro/imipenem  patient takes amox previously -- our pharmacy team confirmed with his mother Switched cipro to piptazo 3/25. Fever curve improving    #ongoing fever Ddx drug fever, occult abscess from mrsa, nosocomial line infection such as yeast or other bacteria, acalculous cholecystitis, cdiff Diarrhea in setting tube feed with abx -- 3/24 denies abd pain n/v 3/24 resend blood cx No sign of drug fever 3/23 resp cx still with mrsa and pseudomonas (R cipro/imipenem) 3/24 repeat bcx  negative   -will consider repeat abd pelv ct with contrast in the next few days if ongoing fever, although better starting 3/26   #ams resolved Initially at admission Head ct unremarkable 3/07 lp me panel negative; no cx; protein >600; glucose 87; wbc clotted   PLAN: Planned 7 days 3/25-3/31 piptazo Continue vanc for mrsa infection -- 8 weeks from 3/09 and potential to transition to oral abx after 4 weeks Maintain contact infection prevention Discussed with primary team      Principal Problem:   MRSA bacteremia Active Problems:   Severe sepsis (HCC)   Septic embolism (HCC)   Malnutrition of moderate degree   Respiratory failure (HCC)   Diskitis   IVDU (intravenous drug user)   Allergies  Allergen Reactions   Cefepime Rash    Scheduled Meds:  Chlorhexidine Gluconate Cloth  6 each Topical Daily   chlorpheniramine-HYDROcodone  5 mL Oral Q12H   clonazePAM  1 mg Oral BID   enoxaparin (LOVENOX) injection  60 mg Subcutaneous Q12H   feeding supplement  237 mL Oral TID BM   guaiFENesin  20 mL Oral BID   insulin aspart  0-5 Units Subcutaneous QHS   insulin aspart  0-9 Units Subcutaneous TID WC   lidocaine  1 patch Transdermal Q24H   multivitamin with minerals  1 tablet Oral Daily   oxyCODONE  10 mg Oral QID   pantoprazole  40 mg Oral Daily   polyethylene glycol  17 g Oral Daily   senna-docusate  2 tablet Oral BID   sodium chloride flush  10-40 mL Intracatheter Q12H   sodium chloride flush  3-10 mL Intravenous Q12H  thiamine  100 mg Oral Daily   Continuous Infusions:  piperacillin-tazobactam (ZOSYN)  IV 3.375 g (07/02/23 0603)   vancomycin (VANCOCIN) 500 mg in sodium chloride 0.9 % 100 mL IVPB 500 mg (07/02/23 0947)   PRN Meds:.acetaminophen (TYLENOL) oral liquid 160 mg/5 mL, acetaminophen, albuterol, bisacodyl, ibuprofen, lidocaine, methocarbamol, metoprolol tartrate, mouth rinse, mouth rinse, mouth rinse, phenol, sodium chloride flush, sodium chloride  flush   SUBJECTIVE: Fever curve better No other complaint Tolerating abx  Review of Systems: ROS All other ROS was negative, except mentioned above     OBJECTIVE: Vitals:   07/01/23 2004 07/01/23 2316 07/02/23 0446 07/02/23 0724  BP: 138/81 (!) 140/83 (!) 152/87 (!) 144/86  Pulse: (!) 102 99 (!) 103 100  Resp: 16 16 16    Temp: 98.3 F (36.8 C) 98.7 F (37.1 C) 98.7 F (37.1 C) 99.3 F (37.4 C)  TempSrc: Oral Oral Oral Oral  SpO2: 97% 96% 96% 98%  Weight:   61.4 kg   Height:       Body mass index is 18.88 kg/m.  Physical Exam General/constitutional: no distress, pleasant HEENT: Normocephalic, PER, Conj Clear, EOMI, Oropharynx clear CV: rrr no mrg Lungs: normal respiratory effort Abd: Soft, Nontender Ext: no edema Skin: No Rash Neuro: nonfocal -- generalized weakness MSK: no peripheral joint swelling/tenderness/warmth; back spines nontender   Central line presence: right upper ext picc site no erythema/purulence/tenderness   Lab Results Lab Results  Component Value Date   WBC 15.4 (H) 07/02/2023   HGB 8.6 (L) 07/02/2023   HCT 27.8 (L) 07/02/2023   MCV 88.0 07/02/2023   PLT 427 (H) 07/02/2023    Lab Results  Component Value Date   CREATININE 1.08 07/02/2023   BUN 25 (H) 07/02/2023   NA 137 07/02/2023   K 3.7 07/02/2023   CL 101 07/02/2023   CO2 27 07/02/2023    Lab Results  Component Value Date   ALT 49 (H) 07/02/2023   AST 48 (H) 07/02/2023   ALKPHOS 117 07/02/2023   BILITOT 0.5 07/02/2023      Microbiology: Recent Results (from the past 240 hours)  Culture, Respiratory w Gram Stain     Status: None   Collection Time: 06/29/23  1:25 PM   Specimen: Tracheal Aspirate; Respiratory  Result Value Ref Range Status   Specimen Description   Final    TRACHEAL ASPIRATE Performed at [facility], [address] [place], [place] [address]    Special Requests   Final    NONE Performed at [facility], [address] [place], [place] [address]    Gram Stain   Final    NO WBC SEEN RARE GRAM POSITIVE COCCI RARE GRAM NEGATIVE RODS    Culture   Final    ABUNDANT METHICILLIN RESISTANT STAPHYLOCOCCUS AUREUS FEW PSEUDOMONAS AERUGINOSA HEALTH DEPARTMENT NOTIFIED Performed at [facility], [address], [place], [place] [address]    Report Status 07/01/2023 FINAL  Final   Organism ID, Bacteria METHICILLIN RESISTANT STAPHYLOCOCCUS AUREUS  Final   Organism ID, Bacteria PSEUDOMONAS AERUGINOSA  Final      Susceptibility   Methicillin resistant staphylococcus aureus - MIC*    CIPROFLOXACIN >=8 RESISTANT Resistant     ERYTHROMYCIN >=8 RESISTANT Resistant     GENTAMICIN <=0.5 SENSITIVE Sensitive     OXACILLIN >=4 RESISTANT Resistant     TETRACYCLINE <=1 SENSITIVE Sensitive     VANCOMYCIN 1 SENSITIVE Sensitive     TRIMETH/SULFA >=320 RESISTANT Resistant  CLINDAMYCIN <=0.25 SENSITIVE Sensitive     RIFAMPIN <=0.5 SENSITIVE Sensitive     Inducible Clindamycin NEGATIVE Sensitive     LINEZOLID 2 SENSITIVE Sensitive     * ABUNDANT METHICILLIN RESISTANT STAPHYLOCOCCUS AUREUS   Pseudomonas aeruginosa - MIC*    CEFTAZIDIME 2 SENSITIVE Sensitive     CIPROFLOXACIN 2 RESISTANT Resistant     GENTAMICIN <=1 SENSITIVE Sensitive     IMIPENEM >=16 RESISTANT Resistant     PIP/TAZO <=4 SENSITIVE Sensitive ug/mL    CEFEPIME 2 SENSITIVE Sensitive     * FEW PSEUDOMONAS AERUGINOSA  Culture, blood (Routine X 2) w Reflex to ID Panel     Status: None (Preliminary result)   Collection Time: 06/30/23 10:22 AM   Specimen: BLOOD  Result Value Ref Range Status   Specimen Description   Final    BLOOD BLOOD LEFT HAND AEROBIC BOTTLE ONLY Performed at [facility], [address], [place], [place] [address]    Special Requests   Final    BOTTLES DRAWN AEROBIC ONLY Blood Culture results may not be optimal due to an inadequate volume of blood received in culture bottles Performed at [facility], [address], [place], [place] [address]    Culture   Final    NO GROWTH 2 DAYS Performed at [facility], [address], [place], [place] [address]    Report Status PENDING  Incomplete  Culture, blood (Routine X 2) w Reflex to ID Panel     Status: None (Preliminary result)   Collection Time: 06/30/23 10:28 AM   Specimen: BLOOD  Result Value Ref Range Status   Specimen Description   Final    BLOOD BLOOD LEFT HAND AEROBIC BOTTLE ONLY Performed at [facility], [address], [place], [place] [address]    Special Requests   Final    BOTTLES DRAWN AEROBIC ONLY Blood Culture results may not be optimal due to an inadequate volume of blood received in culture bottles Performed at [facility], [address] [place], [place] [address]    Culture   Final    NO GROWTH 2 DAYS Performed at [facility], [address] [place], [place] [address]    Report Status PENDING  Incomplete     Serology:   Imaging: If present, new imagings (plain films, ct scans, and mri) have been personally visualized and interpreted; radiology reports have been reviewed. Decision making incorporated into the Impression / Recommendations.  06/21/23 mri lumbar spine wwo contrast 1. Abnormal edema and enhancement affecting the L5 vertebral body which could go along with osteomyelitis. The L4-5 and L5-S1 disc spaces are normal. 2. Abnormal appearance of the paravertebral soft tissues surrounding L5 including the epidural space at L5-S1 with edema and enhancement consistent with inflammatory change. The epidural changes are phlegmonous at this point in time without what I would characterize as a frank epidural abscess.   3/15 mri thoracic spine wwo contrast 1. No evidence of discitis, osteomyelitis or septic facet arthritis. 2. Extensive patchy bilateral pulmonary pathology, not well evaluated using this technique.   3/14 ct  abd pelv with contrast 1. Incompletely visualized small to moderate left pneumothorax at the left lung base. 2. Negative for rim enhancing fluid collection within the abdomen or pelvis to suggest drainable abscess. 3. Contracted gallbladder with possible wall thickening but no calcified stones. 4. Persistent bilateral lower lobe consolidations with areas of heterogeneous low density suggestive of necrotic  infection. Multiple cavitary lung lesions corresponding to history of septic emboli. Small bilateral pleural effusions. 5. Suspicion of hypodense filling defect within the bilateral common iliac veins right greater than left, suspicious for DVT. 6. Question small volume low-density fluid along the right anterior margins of the L4 and L5 vertebral bodies. No overt osseous destructive change or abnormal appearance of the disc spaces but presence of spine infection cannot be excluded and correlation with MRI should be considered. 7. Small free fluid within the abdomen and pelvis. Presacral soft tissue stranding and small volume fluid. Mild generalized subcutaneous edema. 8. Slightly thick-walled urinary bladder, decompressed by Foley catheter.    3/13 tee  1. Left ventricular ejection fraction, by estimation, is 45 to 50%. The  left ventricle has mildly decreased function.   2. Right ventricular systolic function is normal. The right ventricular  size is normal.   3. No left atrial/left atrial appendage thrombus was detected.   4. The mitral valve is grossly normal. Trivial mitral valve  regurgitation.   5. The aortic valve is tricuspid. Aortic valve regurgitation is not  visualized.   6. Agitated saline contrast bubble study was positive with shunting  observed within 3-6 cardiac cycles suggestive of interatrial shunt, Grade  1.   Conclusion(s)/Recommendation(s): No evidence of vegetation/infective  endocarditis on this transesophageael echocardiogram.      3/9 ct chest  without contrast 1. Very extensive heterogeneous and consolidative airspace disease throughout the lungs, much of this nodular in appearance, with several cavitary nodules present. Findings are highly suspicious for septic emboli given stated clinical concern. 2. Small bilateral pleural effusions. 3. Cardiomegaly.   3/08 tte  1. Left ventricular ejection fraction, by estimation, is 40 to 45%. The  left ventricle has mildly decreased function. The left ventricle  demonstrates global hypokinesis. The left ventricular internal cavity size  was mildly dilated. There is mild left ventricular hypertrophy. Left ventricular diastolic parameters were normal.   2. Right ventricular systolic function is moderately reduced. The right  ventricular size is moderately enlarged. Tricuspid regurgitation signal is  inadequate for assessing PA pressure.   3. The mitral valve is abnormal. Trivial mitral valve regurgitation. No  evidence of mitral stenosis.   4. The aortic valve is tricuspid. Aortic valve regurgitation is not  visualized. No aortic stenosis is present.   5. The inferior vena cava is normal in size with greater than 50%  respiratory variability, suggesting right atrial pressure of 3 mmHg.   6. Agitated saline contrast bubble study was negative, with no evidence  of any interatrial shunt.      Raymondo Band, MD [facility] [phone] pager    07/02/2023, 11:01 AM

## 2023-07-02 NOTE — Evaluation (Signed)
 Occupational Therapy Evaluation Patient Details Name: Brendan Preston MRN: [id number] DOB: [birth year] Today's Date: 07/02/2023   History of Present Illness   74 yoM admitted 06/13/23 with AMS, fever, tachycardic, normo to slightly hypertensive . Patient intubated 3/13-3/24.PMH: polysubstance abuse in which EMS     Clinical Impressions Pt presents with decline in function and safety with ADLs and ADL mobility with significantly impaired strength, balance and  activity tolerance/ PTA pt was Ind with ADLs, IADLs, home mgt, driving and mobility with no ADs required. Pt currently requires min A to sit EOB, mod/min A for sitting balance, max A with UB ADLs, total A with LB ADLs, total A with toileting tasks at bed level. Recommend post acute rehab setting > 3 hours/day to help restore PLOF once medically ready from acute care stay, OT will follow acutely to maximize level of function and safety     If plan is discharge home, recommend the following:   A lot of help with walking and/or transfers;Two people to help with walking and/or transfers;Assistance with cooking/housework;Assist for transportation;Help with stairs or ramp for entrance     Functional Status Assessment   Patient has had a recent decline in their functional status and demonstrates the ability to make significant improvements in function in a reasonable and predictable amount of time.     Equipment Recommendations   Other (comment) (TBD at next venue of care)     Recommendations for Other Services         Precautions/Restrictions   Precautions Precautions: Fall Restrictions Weight Bearing Restrictions Per Provider Order: No     Mobility Bed Mobility Overal bed mobility: Needs Assistance Bed Mobility: Rolling, Sidelying to Sit, Sit to Supine Rolling: Min assist Sidelying to sit: Min assist   Sit to supine: Min assist   General bed mobility comments: increased time and effort, assist to laterally scoot  to rail. Max A to scoot to [facility] once returned to supine. pt very weak and debilitated, mod/min A required for trunk/UB balance sitting EOB. Pt tolerated sitting EOB 5-7 minutes    Transfers                   General transfer comment: unable to attempt  to stand      Balance Overall balance assessment: Needs assistance Sitting-balance support: Bilateral upper extremity supported, Feet supported Sitting balance-Leahy Scale: Poor Sitting balance - Comments: forward leaning                                   ADL either performed or assessed with clinical judgement   ADL Overall ADL's : Needs assistance/impaired Eating/Feeding: Set up;Sitting;Bed level   Grooming: Wash/dry hands;Wash/dry face;Contact guard assist;Bed level   Upper Body Bathing: Maximal assistance   Lower Body Bathing: Total assistance   Upper Body Dressing : Maximal assistance   Lower Body Dressing: Total assistance     Toilet Transfer Details (indicate cue type and reason): unable Toileting- Clothing Manipulation and Hygiene: Total assistance;Bed level         General ADL Comments: pt very weak and debilitated, mod/min A required for trunk/UB balance sitting EOB     Vision Ability to See in Adequate Light: 0 Adequate Patient Visual Report: No change from baseline       Perception         Praxis         Pertinent Vitals/Pain Pain Assessment Pain Assessment:  0-10 Faces Pain Scale: Hurts even more Pain Location: back and throat Pain Descriptors / Indicators: Aching, Sore Pain Intervention(s): Monitored during session, Repositioned     Extremity/Trunk Assessment Upper Extremity Assessment Upper Extremity Assessment: Generalized weakness   Lower Extremity Assessment Lower Extremity Assessment: Defer to PT evaluation   Cervical / Trunk Assessment Cervical / Trunk Assessment: Normal   Communication Communication Factors Affecting Communication: Reduced clarity of  speech   Cognition Arousal: Alert Behavior During Therapy: WFL for tasks assessed/performed Cognition: No apparent impairments                               Following commands: Intact       Cueing  General Comments          Exercises     Shoulder Instructions      Home Living Family/patient expects to be discharged to:: Private residence Living Arrangements: Non-relatives/Friends Available Help at Discharge: Available PRN/intermittently Type of Home: House       Home Layout: One level     Bathroom Shower/Tub: Tub/shower unit;Walk-in shower   Bathroom Toilet: Standard     Home Equipment: None          Prior Functioning/Environment Prior Level of Function : Independent/Modified Independent                    OT Problem List: Decreased strength;Impaired balance (sitting and/or standing);Pain;Decreased activity tolerance;Decreased coordination;Decreased knowledge of use of DME or AE   OT Treatment/Interventions: Self-care/ADL training;DME and/or AE instruction;Therapeutic activities;Balance training;Therapeutic exercise;Patient/family education      OT Goals(Current goals can be found in the care plan section)   Acute Rehab OT Goals Patient Stated Goal: get stronger OT Goal Formulation: With patient Potential to Achieve Goals: Good ADL Goals Pt Will Perform Grooming: with supervision;with set-up;sitting Pt Will Perform Upper Body Bathing: with mod assist;with min assist;sitting Pt Will Perform Upper Body Dressing: with mod assist;with min assist;sitting Pt Will Transfer to Toilet: with max assist;with mod assist;with +2 assist;stand pivot transfer;bedside commode Additional ADL Goal #1: Pt will sit EOB x 5-10 minutes with min A for balance/support to facilitate increased activity tolerance for functional tasks   OT Frequency:       Co-evaluation              AM-PAC OT "6 Clicks" Daily Activity     Outcome Measure Help from  another person eating meals?: A Little Help from another person taking care of personal grooming?: A Little Help from another person toileting, which includes using toliet, bedpan, or urinal?: Total Help from another person bathing (including washing, rinsing, drying)?: Total Help from another person to put on and taking off regular upper body clothing?: A Lot Help from another person to put on and taking off regular lower body clothing?: Total 6 Click Score: 11   End of Session    Activity Tolerance: Patient limited by fatigue Patient left: in bed;with call bell/phone within reach;with bed alarm set  OT Visit Diagnosis: Other abnormalities of gait and mobility (R26.89);Muscle weakness (generalized) (M62.81);Pain Pain - part of body:  (throat, back)                Time: 1610-9604 OT Time Calculation (min): 19 min Charges:  OT General Charges $OT Visit: 1 Visit OT Evaluation $OT Eval Moderate Complexity: 1 Mod    Galen Manila 07/02/2023, 4:28 PM

## 2023-07-02 NOTE — Progress Notes (Signed)
 Inpatient Rehabilitation Admissions Coordinator   Attempted to contact patient by phone to begin rehab assessment.  Unable to reach him. Noted end date of IV antibiotics to be 08/09/23. Currently bed level therapy tolerance. I will discuss with Dr Riley Kill and follow up tomorrow. Noted on ID consult history of IVDA 2 weeks ago in left arm. Patient currently not at a level to pursue CIR admit. Will need to clarify caregiver supports for CIR length of stay would not support completion of IV antibiotics or to return to complete independence with a prolonged CIR stay to return home alone .  Ottie Glazier, RN, MSN Rehab Admissions Coordinator [phone] 07/02/2023 5:43 PM

## 2023-07-02 NOTE — Progress Notes (Signed)
 PROGRESS NOTE    Brendan Preston  HYQ:[id number] DOB: 03/28/1976 DOA: 06/13/2023 PCP: Patient, No Pcp Per  Subjective: Pt seen and examined. 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 Not using IV drugs. Pt being evaluated by CIR.  Discussed his long history of drug abuse. Pt states he was on suboxone in the past. He wants to try and get back on suboxone.  Discussed that his oxycodone dose would not be increased while he is in the hospital. And that he will not be given anymore IV opiates.  Discussed with him that it would be best for him to get on suboxone therapy as soon as possible but he would need to stop all opiate therapy for 24-48 hours prior to starting suboxone. He states he will "think about it".   Hospital Course: HPI: Pt encephalopathic, therefore HPI obtained from chart review.  See additional chart w/MRN [id number]   67 yoM with prior hx of known polysubstance (heroin/ fentanyl) abuse in which EMS was called out for AMS, last seen awake 3 days ago covered in urine and feces.    In ER, temp 104.3 rectal, tachycardic, normo to slightly hypertensive, sats 100% on NRB> but maintaining sats on RA with GCS ~10 but protecting airway.  Labs significant for lactic 8.4, WBC 15, plts 96, VBG 7.29/ 44,  CXR neg, RVP neg, EKG without ST changes, qtc borderline, CTH official read pending, trop hs 694, INR 1.3.  Foley placed- urine cloudy, glucose 150, Hgb 150 (-RBC), neg leuks/nitrates, WBC 6-10, UDS positive for THC and opiates.  CMET and other labs had to be redrawn and still awaiting due to hemolysis.  Cultures sent, empiric vanc/ cefepime given, 2L LR.  PCCM called for admit.  Significant Events: Admitted 06/13/2023 for severe sepsis 3/7 admit, encephalopathic, altered, drug rash developed after cefepime. BLOOD UCLURE MRSA 3/8 TTE negative for endocarditis 3/10 he had a TEE scheduled but sounds like he has declined this . Larey Seat out of bed late day shift and broke his nose. Bcx still  pos. 3/11 Tubed in afternoon. CRP high.  3/12 changing his pain reg/ PAD 3/13 on vent 3/14 CT abdomen showed left pneumothorax, bilateral lower lobe consolidation, multiple cavitary lesions, suspicious for bilateral common iliac DVT, fluid collection along L4/L5 vertebral bodies. 3/14 left pigtail chest tube for pneumothorax 3/15 started on IV heparin, venous duplex negative, MRI lumbar spine suggests L5 osteo/no epidural abscess 3/17 changing IV heparin to LMWH as heparin levels not detectable. Still having marked trach secretions. Growing PA so added meropenem (given his rash w/ cefepime). Changed fent to dilaudid at 2mg /hr. CT clamped.  3/18 hgb down to 6.6 got 1 unit PRBC. No obvious bleeding source. Was >13 liters positive so also starting diuretics. Dec'd dilaudid dose to 1.5mg /hr. no PTX so CT removed.  3/19 tolerated 7 hours PSV 10/5 but had quite a lot of secretions requiring frequent suctioning CHES TTUBE REMOVED 3/21 - Temp 99.5. On 30% FiO2. Diuresed well overnight, 5L past 24 hours but still +8L net since admit. 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 On and off low grade fever 101F. On vent 30% fio2/ Per RN and patient - c/p severe cough an donly lidocaine neb helps. C/oc copious resp  secretions. PER RN - due to copious secretions patient heading for trach 3/24 extubated 07-02-2023 care transferred to [facility]  Antibiotic Therapy: Anti-infectives (From admission, onward)    Start     Dose/Rate Route Frequency Ordered Stop   07/01/23 1515  piperacillin-tazobactam (ZOSYN) IVPB 3.375 g        3.375 g 12.5 mL/hr over 240 Minutes Intravenous Every 8 hours 07/01/23 1415     06/30/23 1400  ciprofloxacin (CIPRO) IVPB  400 mg  Status:  Discontinued        400 mg 200 mL/hr over 60 Minutes Intravenous Every 8 hours 06/30/23 1224 07/01/23 1415   06/27/23 2200  vancomycin (VANCOREADY) IVPB 500 mg/100 mL  Status:  Discontinued        500 mg 100 mL/hr over 60 Minutes Intravenous Every 12 hours 06/27/23 1140 06/27/23 1619   06/27/23 2200  vancomycin (VANCOREADY) IVPB 500 mg/100 mL  Status:  Discontinued        500 mg 100 mL/hr over 60 Minutes Intravenous Every 12 hours 06/27/23 1619 06/27/23 1619   06/27/23 2200  vancomycin (VANCOCIN) 500 mg in sodium chloride 0.9 % 100 mL IVPB        500 mg 100 mL/hr over 60 Minutes Intravenous Every 12 hours 06/27/23 1623     06/25/23 1400  ciprofloxacin (CIPRO) IVPB 400 mg        400 mg 200 mL/hr over 60 Minutes Intravenous Every 8 hours 06/25/23 0748 06/29/23 2329   06/23/23 2200  meropenem (MERREM) 1 g in sodium chloride 0.9 % 100 mL IVPB  Status:  Discontinued        1 g 200 mL/hr over 30 Minutes Intravenous Every 8 hours 06/23/23 1108 06/25/23 0748   06/23/23 1130  meropenem (MERREM) 1 g in sodium chloride 0.9 % 100 mL IVPB        1 g 200 mL/hr over 30 Minutes Intravenous  Once 06/23/23 1057 06/23/23 1140   06/17/23 1000  vancomycin (VANCOREADY) IVPB 1250 mg/250 mL  Status:  Discontinued        1,250 mg 166.7 mL/hr over 90 Minutes Intravenous Every 24 hours 06/17/23 0709 06/17/23 0818   06/17/23 0900  vancomycin (VANCOREADY) IVPB 750 mg/150 mL  Status:  Discontinued        750 mg 150 mL/hr over 60 Minutes Intravenous Every 12 hours 06/17/23 0818 06/27/23 1140   06/16/23 1030  vancomycin (VANCOCIN) IVPB 1000 mg/200 mL premix  Status:  Discontinued        1,000 mg 200 mL/hr over 60 Minutes Intravenous Every 24 hours 06/16/23 1022 06/17/23 0709   06/15/23 1345  cefTRIAXone (ROCEPHIN) 2 g in sodium chloride 0.9 % 100 mL IVPB  Status:  Discontinued        2 g 200 mL/hr over 30 Minutes Intravenous Every 24 hours 06/15/23 1249 06/15/23 2226   06/14/23 1000  vancomycin  (VANCOREADY) IVPB 750 mg/150 mL  Status:  Discontinued        750 mg 150 mL/hr over 60 Minutes Intravenous Every 24 hours 06/14/23 0852 06/16/23 1022   06/13/23 1940  cefTRIAXone (ROCEPHIN) 2 g in sodium chloride 0.9 % 100 mL IVPB  Status:  Discontinued        2 g 200 mL/hr over 30 Minutes Intravenous 2 times daily 06/13/23 1940 06/14/23 0802   06/13/23 1830  cefTRIAXone (ROCEPHIN) 2 g in sodium chloride 0.9 % 100 mL IVPB  Status:  Discontinued        2 g  200 mL/hr over 30 Minutes Intravenous Every 24 hours 06/13/23 1803 06/13/23 1940   06/13/23 1445  vancomycin (VANCOCIN) 500 mg in sodium chloride 0.9 % 100 mL IVPB        500 mg 100 mL/hr over 60 Minutes Intravenous  Once 06/13/23 1430 06/13/23 1627   06/13/23 1430  vancomycin (VANCOREADY) IVPB 500 mg/100 mL  Status:  Discontinued        500 mg 100 mL/hr over 60 Minutes Intravenous  Once 06/13/23 1426 06/13/23 1430   06/13/23 1400  vancomycin (VANCOCIN) IVPB 1000 mg/200 mL premix        1,000 mg 200 mL/hr over 60 Minutes Intravenous  Once 06/13/23 1354 06/13/23 1527   06/13/23 1345  ceFEPIme (MAXIPIME) 2 g in sodium chloride 0.9 % 100 mL IVPB        2 g 200 mL/hr over 30 Minutes Intravenous  Once 06/13/23 1335 06/13/23 1449       Procedures:   Consultants: PCCM Infectious Disease    Assessment and Plan: * MRSA bacteremia 07-02-2023 [facility] assumed care on 07-01-2024. Pt has spent the last 18 prior days on PCCM service. Transesophageal Echo negative for endocarditis. ID states pt will need 8 weeks of IV Vanco. Start date is 06-15-2023.  So end date of therapy is Aug 09, 2023  IVDU (intravenous drug user) 07-02-2023 pt states has used IV drugs in the past. 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 Pt states he was on suboxone in the past. He wants to try and get back on suboxone.  Discussed that his oxycodone dose would not be increased while he is in the hospital. And that he will not  be given anymore IV opiates. Discussed with him that it would be best for him to get on suboxone therapy as soon as possible but he would need to stop all opiate therapy for 24-48 hours prior to starting suboxone. He states he will "think about it".  Septic discitis of lumbar region 07-02-2023 [facility] assumed care on 07-01-2024. Pt has spent the last 18 prior days on PCCM service. Transesophageal Echo negative for endocarditis. ID states pt will need 8 weeks of IV Vanco. Start date is 06-15-2023.  So end date of therapy is Aug 09, 2023   Acute respiratory failure with hypoxia (HCC) 07-02-2023 pt extubated on 06-30-2023. Currently on 2 L/min. Wean to RA if possible.  Malnutrition of moderate degree Nutrition Status: Nutrition Problem: Moderate Malnutrition Etiology: chronic illness (polysubstance abuse) Signs/Symptoms: mild fat depletion, mild muscle depletion Interventions: Refer to RD note for recommendations, Tube feeding    Septic embolism (HCC) Present on admission.  Severe sepsis (HCC) Present on admission.  DVT prophylaxis: SCDs Start: 06/13/23 1755    Code Status: Full Code Family Communication: no family at bedside. Pt is decisional. Disposition Plan: CIR VS SNF vs home with home health Reason for continuing need for hospitalization: remains on IV ABX.  Objective: Vitals:   07/01/23 2316 07/02/23 0446 07/02/23 0724 07/02/23 1142  BP: (!) 140/83 (!) 152/87 (!) 144/86 132/79  Pulse: 99 (!) 103 100 (!) 106  Resp: 16 16    Temp: 98.7 F (37.1 C) 98.7 F (37.1 C) 99.3 F (37.4 C) 98.9 F (37.2 C)  TempSrc: Oral Oral Oral Oral  SpO2: 96% 96% 98% 99%  Weight:  61.4 kg    Height:        Intake/Output Summary (Last 24 hours) at 07/02/2023 1558 Last  data filed at 07/02/2023 1500 Gross per 24 hour  Intake 462.95 ml  Output 750 ml  Net -287.05 ml   Filed Weights   06/30/23 0457 07/01/23 0416 07/02/23 0446  Weight: 63.1 kg 59 kg 61.4 kg    Examination:  Physical  Exam Vitals and nursing note reviewed.  Constitutional:      General: He is not in acute distress.    Appearance: He is not toxic-appearing.  HENT:     Head: Normocephalic and atraumatic.     Nose: Nose normal.  Cardiovascular:     Rate and Rhythm: Normal rate and regular rhythm.  Pulmonary:     Effort: No respiratory distress.     Comments: Coarse BS bilaterally Abdominal:     General: Bowel sounds are normal.     Palpations: Abdomen is soft.  Musculoskeletal:     Comments: Right UE PICC  Skin:    General: Skin is warm and dry.     Capillary Refill: Capillary refill takes less than 2 seconds.  Neurological:     Mental Status: He is alert and oriented to person, place, and time.     Comments: Very soft and hoarse voice    Data Reviewed: I have personally reviewed following labs and imaging studies  CBC: Recent Labs  Lab 06/28/23 0648 06/29/23 0536 06/30/23 0354 07/01/23 0413 07/02/23 0448  WBC 9.1 10.6* 10.7* 13.9* 15.4*  NEUTROABS  --   --   --  10.0* 11.8*  HGB 8.7* 8.3* 7.9* 8.3* 8.6*  HCT 27.5* 26.8* 25.8* 26.4* 27.8*  MCV 87.3 87.0 89.9 88.0 88.0  PLT 370 391 345 388 427*   Basic Metabolic Panel: Recent Labs  Lab 06/28/23 0648 06/29/23 0536 06/30/23 0354 07/01/23 0413 07/02/23 0448  NA 139 138 139 137 137  K 3.5 3.9 3.6 3.2* 3.7  CL 99 99 101 99 101  CO2 31 29 29 29 27   GLUCOSE 135* 123* 166* 114* 131*  BUN 26* 33* 45* 35* 25*  CREATININE 1.07 1.16 1.41* 1.19 1.08  CALCIUM 7.8* 7.8* 8.0* 7.9* 8.4*  MG 2.0 2.0 2.1 2.1 2.2  PHOS 5.1* 4.7* 5.5* 4.6 4.1   GFR: Estimated Creatinine Clearance: 71.9 mL/min (by C-G formula based on SCr of 1.08 mg/dL). Liver Function Tests: Recent Labs  Lab 07/01/23 0413 07/02/23 0448  AST 51* 48*  ALT 48* 49*  ALKPHOS 108 117  BILITOT 0.6 0.5  PROT 8.3* 8.6*  ALBUMIN 1.9* 1.9*   CBG: Recent Labs  Lab 07/01/23 1151 07/01/23 1809 07/01/23 2002 07/02/23 0721 07/02/23 1140  GLUCAP 131* 103* 146* 120* 146*     Recent Results (from the past 240 hours)  Culture, Respiratory w Gram Stain     Status: None   Collection Time: 06/29/23  1:25 PM   Specimen: Tracheal Aspirate; Respiratory  Result Value Ref Range Status   Specimen Description   Final    TRACHEAL ASPIRATE Performed at [facility], [address], [place], [place] [address]    Special Requests   Final    NONE Performed at [facility], [address] [place], [place] [address]    Gram Stain   Final    NO WBC SEEN RARE GRAM POSITIVE COCCI RARE GRAM NEGATIVE RODS    Culture   Final    ABUNDANT METHICILLIN RESISTANT STAPHYLOCOCCUS AUREUS FEW PSEUDOMONAS AERUGINOSA HEALTH DEPARTMENT NOTIFIED Performed at [facility], [address] [place], [place] [address]  Report Status 07/01/2023 FINAL  Final   Organism ID, Bacteria METHICILLIN RESISTANT STAPHYLOCOCCUS AUREUS  Final   Organism ID, Bacteria PSEUDOMONAS AERUGINOSA  Final      Susceptibility   Methicillin resistant staphylococcus aureus - MIC*    CIPROFLOXACIN >=8 RESISTANT Resistant     ERYTHROMYCIN >=8 RESISTANT Resistant     GENTAMICIN <=0.5 SENSITIVE Sensitive     OXACILLIN >=4 RESISTANT Resistant     TETRACYCLINE <=1 SENSITIVE Sensitive     VANCOMYCIN 1 SENSITIVE Sensitive     TRIMETH/SULFA >=320 RESISTANT Resistant     CLINDAMYCIN <=0.25 SENSITIVE Sensitive     RIFAMPIN <=0.5 SENSITIVE Sensitive     Inducible Clindamycin NEGATIVE Sensitive     LINEZOLID 2 SENSITIVE Sensitive     * ABUNDANT METHICILLIN RESISTANT STAPHYLOCOCCUS AUREUS   Pseudomonas aeruginosa - MIC*    CEFTAZIDIME 2 SENSITIVE Sensitive     CIPROFLOXACIN 2 RESISTANT Resistant     GENTAMICIN <=1 SENSITIVE Sensitive     IMIPENEM >=16 RESISTANT Resistant     PIP/TAZO <=4 SENSITIVE Sensitive ug/mL    CEFEPIME 2 SENSITIVE Sensitive     * FEW PSEUDOMONAS AERUGINOSA  Culture, blood (Routine X 2) w Reflex to ID Panel     Status: None (Preliminary result)    Collection Time: 06/30/23 10:22 AM   Specimen: BLOOD  Result Value Ref Range Status   Specimen Description   Final    BLOOD BLOOD LEFT HAND AEROBIC BOTTLE ONLY Performed at [facility], [address], [place], [place] [address]    Special Requests   Final    BOTTLES DRAWN AEROBIC ONLY Blood Culture results may not be optimal due to an inadequate volume of blood received in culture bottles Performed at [facility], [address], [place], [place] [address]    Culture   Final    NO GROWTH 2 DAYS Performed at [facility], [address], [place], [place] [address]    Report Status PENDING  Incomplete  Culture, blood (Routine X 2) w Reflex to ID Panel     Status: None (Preliminary result)   Collection Time: 06/30/23 10:28 AM   Specimen: BLOOD  Result Value Ref Range Status   Specimen Description   Final    BLOOD BLOOD LEFT HAND AEROBIC BOTTLE ONLY Performed at [facility], [address], [place], [place] [address]    Special Requests   Final    BOTTLES DRAWN AEROBIC ONLY Blood Culture results may not be optimal due to an inadequate volume of blood received in culture bottles Performed at [facility], [address] [place], [place] [address]    Culture   Final    NO GROWTH 2 DAYS Performed at [facility], [address] [place], [place] [address]    Report Status PENDING  Incomplete     Scheduled Meds:  Chlorhexidine Gluconate Cloth  6 each Topical Daily   chlorpheniramine-HYDROcodone  5 mL Oral Q12H   clonazePAM  1 mg Oral BID   enoxaparin (LOVENOX) injection  60 mg Subcutaneous Q12H   feeding supplement  237 mL Oral TID BM   guaiFENesin  20 mL Oral BID   insulin aspart  0-5 Units Subcutaneous QHS   insulin aspart  0-9 Units Subcutaneous TID WC   lidocaine  1 patch Transdermal Q24H   multivitamin with minerals  1 tablet Oral Daily   oxyCODONE  10 mg Oral QID   pantoprazole  40 mg Oral Daily   polyethylene glycol  17 g Oral Daily   senna-docusate  2 tablet Oral BID   sodium chloride flush  10-40 mL Intracatheter Q12H   sodium chloride flush  3-10 mL Intravenous Q12H   thiamine  100 mg Oral Daily   Continuous Infusions:  piperacillin-tazobactam (ZOSYN)  IV 3.375 g (07/02/23 1347)   vancomycin (VANCOCIN) 500 mg in sodium chloride 0.9 % 100 mL IVPB 500 mg (07/02/23 0947)     LOS: 19 days   Time spent: 50 minutes  Carollee Herter, DO  [facility]  07/02/2023, 3:58 PM

## 2023-07-02 NOTE — TOC Progression Note (Signed)
 Transition of Care [facility]) - Progression Note    Patient Details  Name: Brendan Preston MRN: [id number] Date of Birth: [birth year]  [facility] CM/SW Contact  Otelia Santee, LCSW Phone Number: 07/02/2023, 9:53 AM  Clinical Narrative:    Met with pt to offer substance use counseling and resources. Per pt he does not drink alcohol and does not use drugs. Pt shares he has a past hx. of substance use but, does not currently use. Pt declined resources.  Pt currently recommended for CIR for rehab. CIR currently evaluating pt for placement. TOC will follow for disposition.      Barriers to Discharge: Continued Medical Work up  Expected Discharge Plan and Services     Post Acute Care Choice: NA Living arrangements for the past 2 months: Single Family Home                   DME Agency: NA                   Social Determinants of Health (SDOH) Interventions SDOH Screenings   Food Insecurity: Patient Declined (06/15/2023)  Housing: Patient Unable To Answer (06/29/2023)  Transportation Needs: Patient Unable To Answer (06/16/2023)  Utilities: Patient Unable To Answer (06/16/2023)  Tobacco Use: High Risk (06/29/2023)    Readmission Risk Interventions    06/14/2023    8:46 AM  Readmission Risk Prevention Plan  Post Dischage Appt Complete  Medication Screening Complete  Transportation Screening Complete

## 2023-07-02 NOTE — Hospital Course (Addendum)
 48yo with h/o polysubstance (heroin/ fentanyl) abuse who was admitted on 3/7 for AMS, found down.  Admitted to PCCM for severe sepsis, found to have MRSA bacteremia.  TTE negative but TEE eventually positive for tricuspid endocarditis.  Lumbar MRI with osteo, ID consulted.  In-hospital fall with nasal bone fracture.  Decompensated and was intubated 3/11-24.  CT with L PTX with BLL consolidation and multiple cavitary lesions; pigtail chest tube placed.  IV heparin -> Lovenox.  Grew Pseudomonas from trach, treated with IV Meropenem.  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  Nutrition following for malnutrition.  Completed Zosyn on 3/31, Vanc on 4/6.  PICC removed 4/7.  Will dc with Linezolid PO through 4/30 and then 2 weeks of PO Doxy through 5/14.

## 2023-07-03 ENCOUNTER — Other Ambulatory Visit: Payer: Self-pay

## 2023-07-03 DIAGNOSIS — G7281 Critical illness myopathy: Secondary | ICD-10-CM

## 2023-07-03 LAB — COMPREHENSIVE METABOLIC PANEL WITH GFR
ALT: 47 U/L — ABNORMAL HIGH (ref 0–44)
AST: 46 U/L — ABNORMAL HIGH (ref 15–41)
Albumin: 1.9 g/dL — ABNORMAL LOW (ref 3.5–5.0)
Alkaline Phosphatase: 110 U/L (ref 38–126)
Anion gap: 8 (ref 5–15)
BUN: 24 mg/dL — ABNORMAL HIGH (ref 6–20)
CO2: 29 mmol/L (ref 22–32)
Calcium: 8.3 mg/dL — ABNORMAL LOW (ref 8.9–10.3)
Chloride: 98 mmol/L (ref 98–111)
Creatinine, Ser: 1.13 mg/dL (ref 0.61–1.24)
GFR, Estimated: >60 mL/min (ref >60)
Glucose, Bld: 121 mg/dL — ABNORMAL HIGH (ref 70–99)
Potassium: 3.5 mmol/L (ref 3.5–5.1)
Sodium: 135 mmol/L (ref 135–145)
Total Bilirubin: 0.2 mg/dL (ref 0.0–1.2)
Total Protein: 8.3 g/dL — ABNORMAL HIGH (ref 6.5–8.1)

## 2023-07-03 LAB — CBC WITH DIFFERENTIAL/PLATELET
Abs Immature Granulocytes: 0.21 10*3/uL — ABNORMAL HIGH (ref 0.00–0.07)
Basophils Absolute: 0 10*3/uL (ref 0.0–0.1)
Basophils Relative: 0 %
Eosinophils Absolute: 0.1 10*3/uL (ref 0.0–0.5)
Eosinophils Relative: 1 %
HCT: 28 % — ABNORMAL LOW (ref 39.0–52.0)
Hemoglobin: 8.5 g/dL — ABNORMAL LOW (ref 13.0–17.0)
Immature Granulocytes: 1 %
Lymphocytes Relative: 20 %
Lymphs Abs: 3 10*3/uL (ref 0.7–4.0)
MCH: 27.2 pg (ref 26.0–34.0)
MCHC: 30.4 g/dL (ref 30.0–36.0)
MCV: 89.7 fL (ref 80.0–100.0)
Monocytes Absolute: 1 10*3/uL (ref 0.1–1.0)
Monocytes Relative: 7 %
Neutro Abs: 10.8 10*3/uL — ABNORMAL HIGH (ref 1.7–7.7)
Neutrophils Relative %: 71 %
Platelets: 478 10*3/uL — ABNORMAL HIGH (ref 150–400)
RBC: 3.12 MIL/uL — ABNORMAL LOW (ref 4.22–5.81)
RDW: 16.8 % — ABNORMAL HIGH (ref 11.5–15.5)
WBC: 15.2 10*3/uL — ABNORMAL HIGH (ref 4.0–10.5)
nRBC: 0 % (ref 0.0–0.2)

## 2023-07-03 LAB — GLUCOSE, CAPILLARY
Glucose-Capillary: 127 mg/dL — ABNORMAL HIGH (ref 70–99)
Glucose-Capillary: 135 mg/dL — ABNORMAL HIGH (ref 70–99)
Glucose-Capillary: 120 mg/dL — ABNORMAL HIGH (ref 70–99)
Glucose-Capillary: 88 mg/dL (ref 70–99)

## 2023-07-03 LAB — MAGNESIUM: Magnesium: 2.1 mg/dL (ref 1.7–2.4)

## 2023-07-03 MED ORDER — CARVEDILOL 6.25 MG PO TABS
6.2500 mg | ORAL_TABLET | Freq: Two times a day (BID) | ORAL | Status: DC
  Administered 2023-07-03 – 2023-07-13 (×20): 6.25 mg via ORAL
  Filled 2023-07-03 (×22): qty 1

## 2023-07-03 NOTE — [place] FL2 (Signed)
 [place] MEDICAID FL2 LEVEL OF CARE FORM     IDENTIFICATION  Patient Name: Brendan Preston Birthdate: [birth year] Sex: male Admission Date (Current Location): 06/13/2023  [facility] and [place] Number:  Producer, television/film/video and Address:  [facility],  [address] [place], [place] [address]      Provider Number: [id number]  Attending Physician Name and Address:  Carollee Herter, DO  Relative Name and Phone Number:       Current Level of Care: Hospital Recommended Level of Care: Skilled Nursing Facility Prior Approval Number:    Date Approved/Denied:   PASRR Number: [id number]  Discharge Plan: SNF    Current Diagnoses: Patient Active Problem List   Diagnosis Date Noted   Critical illness myopathy 07/03/2023   Acute respiratory failure with hypoxia (HCC) 06/27/2023   Septic discitis of lumbar region 06/27/2023   IVDU (intravenous drug user) 06/27/2023   Malnutrition of moderate degree 06/18/2023   MRSA bacteremia 06/17/2023   Septic embolism (HCC) 06/17/2023   Severe sepsis (HCC) 06/13/2023    Orientation RESPIRATION BLADDER Height & Weight     Self, Time, Situation, Place  O2 (2L) Continent Weight: 137 lb 5.6 oz (62.3 kg) Height:  5\' 11"  (180.3 cm)  BEHAVIORAL SYMPTOMS/MOOD NEUROLOGICAL BOWEL NUTRITION STATUS      Continent Diet (See discharge summary)  AMBULATORY STATUS COMMUNICATION OF NEEDS Skin   Limited Assist Verbally Normal                       Personal Care Assistance Level of Assistance  Bathing, Feeding, Dressing Bathing Assistance: Maximum assistance Feeding assistance: Limited assistance Dressing Assistance: Maximum assistance     Functional Limitations Info  Sight, Hearing, Speech Sight Info: Impaired Hearing Info: Adequate Speech Info: Impaired    SPECIAL CARE FACTORS FREQUENCY  PT (By licensed PT), OT (By licensed OT)     PT Frequency: 5x/wk OT Frequency: 5x/wk            Contractures Contractures Info: Not present     Additional Factors Info  Code Status, Allergies Code Status Info: FULL Allergies Info: Cefepime           Current Medications (07/03/2023):  This is the current hospital active medication list Current Facility-Administered Medications  Medication Dose Route Frequency Provider Last Rate Last Admin   acetaminophen (TYLENOL) 160 MG/5ML solution 650 mg  650 mg Oral Q6H PRN Cherylin Mylar, RPH   650 mg at 07/01/23 1835   acetaminophen (TYLENOL) suppository 650 mg  650 mg Rectal Q4H PRN Charlott Holler, MD   650 mg at 06/15/23 1728   albuterol (PROVENTIL) (2.5 MG/3ML) 0.083% nebulizer solution 2.5 mg  2.5 mg Nebulization Q4H PRN Mannam, Praveen, MD   2.5 mg at 06/30/23 1606   bisacodyl (DULCOLAX) suppository 10 mg  10 mg Rectal Daily PRN Lanier Clam, NP       carvedilol (COREG) tablet 3.125 mg  3.125 mg Oral BID WC Carollee Herter, DO       Chlorhexidine Gluconate Cloth 2 % PADS 6 each  6 each Topical Daily Danelle Earthly, MD   6 each at 07/02/23 1035   chlorpheniramine-HYDROcodone (TUSSIONEX) 10-8 MG/5ML suspension 5 mL  5 mL Oral Q12H Cherylin Mylar, RPH   5 mL at 07/03/23 0911   clonazepam (KLONOPIN) disintegrating tablet 1 mg  1 mg Oral BID Oretha Milch, MD   1 mg at 07/03/23 0911   enoxaparin (LOVENOX) injection 60 mg  60 mg  Subcutaneous Q12H Oretha Milch, MD   60 mg at 07/03/23 1610   feeding supplement (ENSURE ENLIVE / ENSURE PLUS) liquid 237 mL  237 mL Oral TID BM Oretha Milch, MD   237 mL at 07/03/23 1318   guaiFENesin (ROBITUSSIN) 100 MG/5ML liquid 20 mL  20 mL Oral BID Cherylin Mylar, RPH   20 mL at 07/03/23 0916   ibuprofen (ADVIL) 100 MG/5ML suspension 800 mg  800 mg Oral Q8H PRN Cherylin Mylar, RPH   800 mg at 06/30/23 2333   insulin aspart (novoLOG) injection 0-5 Units  0-5 Units Subcutaneous QHS Ogan, Okoronkwo U, MD       insulin aspart (novoLOG) injection 0-9 Units  0-9 Units Subcutaneous TID WC Migdalia Dk, MD   1 Units at 07/03/23 1318   lidocaine (LIDODERM) 5 % 1  patch  1 patch Transdermal Q24H Lanier Clam, NP   1 patch at 07/03/23 0917   lidocaine 2% "Nebulized" 2.5 mL  2.5 mL Nebulization Q4H PRN Oretha Milch, MD       methocarbamol (ROBAXIN) tablet 500 mg  500 mg Oral Q8H PRN Cherylin Mylar, RPH   500 mg at 07/01/23 0303   metoprolol tartrate (LOPRESSOR) injection 2.5-5 mg  2.5-5 mg Intravenous Q3H PRN Lanier Clam, NP   5 mg at 06/27/23 2217   multivitamin with minerals tablet 1 tablet  1 tablet Oral Daily Oretha Milch, MD   1 tablet at 07/03/23 9604   Oral care mouth rinse  15 mL Mouth Rinse PRN Olalere, Adewale A, MD       Oral care mouth rinse  15 mL Mouth Rinse PRN Olalere, Adewale A, MD       Oral care mouth rinse  15 mL Mouth Rinse PRN Oretha Milch, MD       oxyCODONE (Oxy IR/ROXICODONE) immediate release tablet 10 mg  10 mg Oral QID Oretha Milch, MD   10 mg at 07/03/23 1318   pantoprazole (PROTONIX) EC tablet 40 mg  40 mg Oral Daily Oretha Milch, MD   40 mg at 07/03/23 0912   phenol (CHLORASEPTIC) mouth spray 1 spray  1 spray Mouth/Throat PRN Oretha Milch, MD   1 spray at 07/01/23 0243   piperacillin-tazobactam (ZOSYN) IVPB 3.375 g  3.375 g Intravenous Q8H Vu, Trung T, MD 12.5 mL/hr at 07/03/23 1323 3.375 g at 07/03/23 1323   polyethylene glycol (MIRALAX / GLYCOLAX) packet 17 g  17 g Oral Daily Cherylin Mylar, RPH   17 g at 07/02/23 5409   senna-docusate (Senokot-S) tablet 2 tablet  2 tablet Oral BID Cherylin Mylar, [facility]   2 tablet at 07/02/23 2225   sodium chloride flush (NS) 0.9 % injection 10-40 mL  10-40 mL Intracatheter Q12H Olalere, Adewale A, MD   10 mL at 07/03/23 0923   sodium chloride flush (NS) 0.9 % injection 10-40 mL  10-40 mL Intracatheter PRN Olalere, Adewale A, MD       sodium chloride flush (NS) 0.9 % injection 3-10 mL  3-10 mL Intravenous Q12H Perlie Gold, PA-C   10 mL at 07/03/23 1137   sodium chloride flush (NS) 0.9 % injection 3-10 mL  3-10 mL Intravenous PRN Perlie Gold, PA-C       thiamine (VITAMIN B1)  tablet 100 mg  100 mg Oral Daily Cyril Mourning V, MD   100 mg at 07/03/23 0912   vancomycin (VANCOCIN) 500 mg in sodium chloride 0.9 % 100 mL  IVPB  500 mg Intravenous Q12H Herby Abraham, [facility]   Stopped at 07/03/23 1137     Discharge Medications: Please see discharge summary for a list of discharge medications.  Relevant Imaging Results:  Relevant Lab Results:   Additional Information SSN: [id number];  See MAR for IV abx  Otelia Santee, LCSW

## 2023-07-03 NOTE — Progress Notes (Signed)
   07/03/23 1156  Assess: MEWS Score  Temp 97.6 F (36.4 C)  BP 118/79  MAP (mmHg) 90  Pulse Rate (!) 116  SpO2 97 %  O2 Device Nasal Cannula  Patient Activity (if Appropriate) In chair  Assess: MEWS Score  MEWS Temp 0  MEWS Systolic 0  MEWS Pulse 2  MEWS RR 0  MEWS LOC 0  MEWS Score 2  MEWS Score Color Yellow  Assess: if the MEWS score is Yellow or Red  Were vital signs accurate and taken at a resting state? Yes  Does the patient meet 2 or more of the SIRS criteria? No  MEWS guidelines implemented  Yes, yellow  Treat  MEWS Interventions Considered administering scheduled or prn medications/treatments as ordered  Take Vital Signs  Increase Vital Sign Frequency  Yellow: Q2hr x1, continue Q4hrs until patient remains green for 12hrs  Escalate  MEWS: Escalate Yellow: Discuss with charge nurse and consider notifying provider and/or RRT  Notify: Charge Nurse/RN  Name of Charge Nurse/RN Notified [email or web address]  Provider Notification  Provider Name/Title Chen,Eric  Date Provider Notified 07/03/23  Time Provider Notified 1217  Method of Notification Page  Notification Reason Change in status  Provider response No new orders  Date of Provider Response 07/03/23  Time of Provider Response 1230  Assess: SIRS CRITERIA  SIRS Temperature  0  SIRS Respirations  0  SIRS Pulse 1  SIRS WBC 0  SIRS Score Sum  1

## 2023-07-03 NOTE — Progress Notes (Signed)
 Pharmacy Antibiotic Note  Brendan Preston is a 48 y.o. male admitted on 06/13/2023 with bacteremia.  Pharmacy has been consulted for Vancomycin + Zosyn dosing.  The patient had a peak/trough drawn today that resulted as 20/11 mcg/ml respectively for a calculated AUC of 396 which is slightly SUBtherapeutic   SCr bump up to 1.41 noted on 3/24 but has trended back down and remains stable (1.04-1.19)  RN has 500 mg dose up on the floor and ready to give this AM, since close to goal AUC range of 400 - will wait to adjust to q24h dosing in the AM to help with facilitation of new levels at steady state.   Plan: - Continue Vancomycin 500 mg IV q12h dosing thru today - Starting 3/29 AM, adjust to Vancomycin 1250 mg IV every 24 hours (eAUC 495, VT ~10) - Continue Zosyn 3.375g IV every 8 hours - 7d course EOT 4/1 - Will continue to follow renal function, culture results, LOT, and antibiotic de-escalation plans   Height: 5\' 11"  (180.3 cm) Weight: 62.3 kg (137 lb 5.6 oz) IBW/kg (Calculated) : 75.3  Temp (24hrs), Avg:98.3 F (36.8 C), Min:97.6 F (36.4 C), Max:99.2 F (37.3 C)  Recent Labs  Lab 06/30/23 0354 06/30/23 0900 07/01/23 0413 07/02/23 0448 07/03/23 0407 07/04/23 0006 07/04/23 0932  WBC 10.7*  --  13.9* 15.4* 15.2* 13.2*  --   CREATININE 1.41*  --  1.19 1.08 1.13 1.04  --   VANCOTROUGH  --  20  --   --   --   --  11*  VANCOPEAK  --   --   --   --   --  20*  --     Estimated Creatinine Clearance: 75.7 mL/min (by C-G formula based on SCr of 1.04 mg/dL).    Allergies  Allergen Reactions   Cefepime Rash    Antimicrobials this admission: Vancomycin 3/7 >> 3/17 Cefepime 3/7 x 1 Ceftriaxone 3/8 >> 3/9 Meropenem 3/17 >> Cipro 3/19 >>  Dose adjustments this admission: 3/8 VR 17 mcg/ml 3/13 VP/VT 26/18 mcg/ml - AUC 543 3/17 VP/VT 31/18 mcg/ml - AUC 600 3/21 VP/VT 33/17 mcg/ml - AUC 618 - reduce to 500 mg/12h (eAUC 412, VT 11) 3/24 VT 20 mcg/ml (anticipated 11 mcg/ml), SCr  rising 3/28: VP/VT 20/11 mcg/ml, AUC 396 on 500 mg/12h >> adjust to 1250 mg/24h (eAUC 495, VT ~10)  Microbiology results: 3/7 BCx 4/4 MRSA 3/9 BCx >> NGf 3/12 RCx (TA) >> MRSA 3/16 RCx (TA) >> staph aureus + PsA  Thank you for allowing pharmacy to be a part of this patient's care.  Georgina Pillion, PharmD, BCPS, BCIDP Infectious Diseases Clinical Pharmacist 07/04/2023 11:50 AM   **Pharmacist phone directory can now be found on [email or web address] ([email or web address]).  Listed under [facility].

## 2023-07-03 NOTE — Assessment & Plan Note (Deleted)
 07-03-2023 pt turned down by inpatient rehab. Pt is uninsured. CM will attempt SNF placement with potential hospital guarantee. Continue with PT/OT.  07-04-2023 CM still looking for SNF bed.  07-05-2023 CM still looking for SNF bed. CIR declined to accept pt.  07-06-2023 CM still looking for SNF bed.  07-07-2023 CM still looking for SNF bed.  07-08-2023 TOC still looking for SNF bed.

## 2023-07-03 NOTE — Progress Notes (Signed)
 Physical Therapy Treatment Patient Details Name: Brendan Preston MRN: [id number] DOB: 05/22/1975 Today's Date: 07/03/2023   History of Present Illness 22 yoM admitted 06/13/23 with AMS, fever, tachycardic, normo to slightly hypertensive . Patient intubated 3/13-3/24.PMH: polysubstance abuse in which EMS    PT Comments  Pt demonstrates improved tolerance to mobility today, requires 2 person assist for line management, able to come to sit EOB with CGA and cues for sequencing. STS min A from EOB with front to front transfer, tolerates approx 30 sec standing. Pt returns to sitting. Front to front used for bed-chair transfer at mod A, pt able to advance LLE in standing to reposition. In char pt completes closed chain heel raises/toe raises x15BLE and LAQ x15 BLE alternating between reps to mimic functional movement patterns. Pt completes STS with 2WW at [facility], maintains some unassisted standing, but requires CGA to return to sitting due to fatigue after approx 10 sec.     If plan is discharge home, recommend the following: Two people to help with walking and/or transfers;A lot of help with bathing/dressing/bathroom;Assistance with cooking/housework;Help with stairs or ramp for entrance;Assist for transportation   Can travel by private vehicle     No  Equipment Recommendations  Rolling walker (2 wheels)    Recommendations for Other Services       Precautions / Restrictions Precautions Precautions: Fall Recall of Precautions/Restrictions: Intact Restrictions Weight Bearing Restrictions Per Provider Order: No     Mobility  Bed Mobility Overal bed mobility: Needs Assistance Bed Mobility: Rolling, Supine to Sit Rolling: Contact guard assist   Supine to sit: Contact guard     General bed mobility comments: Cues to move LE over EOB and reach for L HR with RUE, able to come to sit upright, CGA trunk    Transfers   Equipment used: None, Rolling walker (2 wheels)               General  transfer comment: front to front STS x1 and Stand pivot to recliner mod A, STS with 2WW CGA-min A, dec tolerance to time in standing (10-30sec)    Ambulation/Gait                   Stairs             Wheelchair Mobility     Tilt Bed    Modified Rankin (Stroke Patients Only)       Balance Overall balance assessment: Needs assistance Sitting-balance support: Feet supported, Bilateral upper extremity supported Sitting balance-Leahy Scale: Fair     Standing balance support: Bilateral upper extremity supported, During functional activity Standing balance-Leahy Scale: Poor                              Communication Communication Factors Affecting Communication: Reduced clarity of speech  Cognition Arousal: Alert Behavior During Therapy: WFL for tasks assessed/performed   PT - Cognitive impairments: No apparent impairments                         Following commands: Intact      Cueing    Exercises Total Joint Exercises Ankle Circles/Pumps: AROM, Both, 15 reps, Seated (closed chain) Long Arc Quad: AROM, Both, 15 reps, Seated    General Comments General comments (skin integrity, edema, etc.): dec endurance      Pertinent Vitals/Pain Pain Assessment Pain Assessment: Faces Faces Pain Scale: Hurts even more ("all over")  Pain Intervention(s): Limited activity within patient's tolerance, Monitored during session, Repositioned    Home Living                          Prior Function            PT Goals (current goals can now be found in the care plan section) Acute Rehab PT Goals Patient Stated Goal: to get up PT Goal Formulation: With patient Time For Goal Achievement: 07/15/23 Potential to Achieve Goals: Good Progress towards PT goals: Progressing toward goals    Frequency    Min 2X/week      PT Plan      Co-evaluation              AM-PAC PT "6 Clicks" Mobility   Outcome Measure  Help needed  turning from your back to your side while in a flat bed without using bedrails?: A Little Help needed moving from lying on your back to sitting on the side of a flat bed without using bedrails?: A Little Help needed moving to and from a bed to a chair (including a wheelchair)?: A Lot Help needed standing up from a chair using your arms (e.g., wheelchair or bedside chair)?: A Little Help needed to walk in hospital room?: Total Help needed climbing 3-5 steps with a railing? : Total 6 Click Score: 13    End of Session Equipment Utilized During Treatment: Gait belt Activity Tolerance: Patient limited by fatigue Patient left: with call bell/phone within reach;in chair;with chair alarm set Nurse Communication: Mobility status PT Visit Diagnosis: Unsteadiness on feet (R26.81);Muscle weakness (generalized) (M62.81);Difficulty in walking, not elsewhere classified (R26.2);Other abnormalities of gait and mobility (R26.89)     Time: 1046-1130 PT Time Calculation (min) (ACUTE ONLY): 44 min  Charges:    $Therapeutic Exercise: 8-22 mins $Therapeutic Activity: 23-37 mins PT General Charges $$ ACUTE PT VISIT: 1 Visit                     Madaline Guthrie, PT Acute Rehabilitation Services Office: [phone] 07/03/2023    Evelena Peat 07/03/2023, 12:00 PM

## 2023-07-03 NOTE — Progress Notes (Addendum)
 PROGRESS NOTE    Brendan Preston  ZOX:[id number] DOB: 08-22-1975 DOA: 06/13/2023 PCP: Patient, No Pcp Per  Subjective: Pt seen and examined. Pt turned down for CIR due to only bed mobility status. Pt does not want to wean oxycodone and start suboxone. Discussed that he would have harder time in the future with opiate withdrawal as he will not get Rx for any pain meds at discharge.  CM looking into SNF placement for IV ABX.  Had fecal incontinence this AM. Feels frustrated/embarrassed by his weakness.    Hospital Course: HPI: Pt encephalopathic, therefore HPI obtained from chart review.  See additional chart w/MRN [id number]   24 yoM with prior hx of known polysubstance (heroin/ fentanyl) abuse in which EMS was called out for AMS, last seen awake 3 days ago covered in urine and feces.    In ER, temp 104.3 rectal, tachycardic, normo to slightly hypertensive, sats 100% on NRB> but maintaining sats on RA with GCS ~10 but protecting airway.  Labs significant for lactic 8.4, WBC 15, plts 96, VBG 7.29/ 44,  CXR neg, RVP neg, EKG without ST changes, qtc borderline, CTH official read pending, trop hs 694, INR 1.3.  Foley placed- urine cloudy, glucose 150, Hgb 150 (-RBC), neg leuks/nitrates, WBC 6-10, UDS positive for THC and opiates.  CMET and other labs had to be redrawn and still awaiting due to hemolysis.  Cultures sent, empiric vanc/ cefepime given, 2L LR.  PCCM called for admit.  Significant Events: Admitted 06/13/2023 for severe sepsis 3/7 admit, encephalopathic, altered, drug rash developed after cefepime. BLOOD UCLURE MRSA 3/8 TTE negative for endocarditis 3/10 he had a TEE scheduled but sounds like he has declined this . Larey Seat out of bed late day shift and broke his nose. Bcx still pos. 3/11 Tubed in afternoon. CRP high.  3/12 changing his pain reg/ PAD 3/13 on vent 3/14 CT abdomen showed left pneumothorax, bilateral lower lobe consolidation, multiple cavitary lesions, suspicious for  bilateral common iliac DVT, fluid collection along L4/L5 vertebral bodies. 3/14 left pigtail chest tube for pneumothorax 3/15 started on IV heparin, venous duplex negative, MRI lumbar spine suggests L5 osteo/no epidural abscess 3/17 changing IV heparin to LMWH as heparin levels not detectable. Still having marked trach secretions. Growing PA so added meropenem (given his rash w/ cefepime). Changed fent to dilaudid at 2mg /hr. CT clamped.  3/18 hgb down to 6.6 got 1 unit PRBC. No obvious bleeding source. Was >13 liters positive so also starting diuretics. Dec'd dilaudid dose to 1.5mg /hr. no PTX so CT removed.  3/19 tolerated 7 hours PSV 10/5 but had quite a lot of secretions requiring frequent suctioning CHES TTUBE REMOVED 3/21 - Temp 99.5. On 30% FiO2. Diuresed well overnight, 5L past 24 hours but still +8L net since admit. 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 On and off low grade fever 101F. On vent 30% fio2/ Per RN and patient - c/p severe cough an donly lidocaine neb helps. C/oc copious resp secretions. PER RN - due to copious secretions patient heading for trach 3/24 extubated 07-02-2023 care transferred to [facility] 07-03-2023 Pt turned down by CIR.  Antibiotic Therapy: Anti-infectives (From admission, onward)    Start     Dose/Rate Route Frequency Ordered Stop  07/01/23 1515  piperacillin-tazobactam (ZOSYN) IVPB 3.375 g        3.375 g 12.5 mL/hr over 240 Minutes Intravenous Every 8 hours 07/01/23 1415     06/30/23 1400  ciprofloxacin (CIPRO) IVPB 400 mg  Status:  Discontinued        400 mg 200 mL/hr over 60 Minutes Intravenous Every 8 hours 06/30/23 1224 07/01/23 1415   06/27/23 2200  vancomycin (VANCOREADY) IVPB 500  mg/100 mL  Status:  Discontinued        500 mg 100 mL/hr over 60 Minutes Intravenous Every 12 hours 06/27/23 1140 06/27/23 1619   06/27/23 2200  vancomycin (VANCOREADY) IVPB 500 mg/100 mL  Status:  Discontinued        500 mg 100 mL/hr over 60 Minutes Intravenous Every 12 hours 06/27/23 1619 06/27/23 1619   06/27/23 2200  vancomycin (VANCOCIN) 500 mg in sodium chloride 0.9 % 100 mL IVPB        500 mg 100 mL/hr over 60 Minutes Intravenous Every 12 hours 06/27/23 1623     06/25/23 1400  ciprofloxacin (CIPRO) IVPB 400 mg        400 mg 200 mL/hr over 60 Minutes Intravenous Every 8 hours 06/25/23 0748 06/29/23 2329   06/23/23 2200  meropenem (MERREM) 1 g in sodium chloride 0.9 % 100 mL IVPB  Status:  Discontinued        1 g 200 mL/hr over 30 Minutes Intravenous Every 8 hours 06/23/23 1108 06/25/23 0748   06/23/23 1130  meropenem (MERREM) 1 g in sodium chloride 0.9 % 100 mL IVPB        1 g 200 mL/hr over 30 Minutes Intravenous  Once 06/23/23 1057 06/23/23 1140   06/17/23 1000  vancomycin (VANCOREADY) IVPB 1250 mg/250 mL  Status:  Discontinued        1,250 mg 166.7 mL/hr over 90 Minutes Intravenous Every 24 hours 06/17/23 0709 06/17/23 0818   06/17/23 0900  vancomycin (VANCOREADY) IVPB 750 mg/150 mL  Status:  Discontinued        750 mg 150 mL/hr over 60 Minutes Intravenous Every 12 hours 06/17/23 0818 06/27/23 1140   06/16/23 1030  vancomycin (VANCOCIN) IVPB 1000 mg/200 mL premix  Status:  Discontinued        1,000 mg 200 mL/hr over 60 Minutes Intravenous Every 24 hours 06/16/23 1022 06/17/23 0709   06/15/23 1345  cefTRIAXone (ROCEPHIN) 2 g in sodium chloride 0.9 % 100 mL IVPB  Status:  Discontinued        2 g 200 mL/hr over 30 Minutes Intravenous Every 24 hours 06/15/23 1249 06/15/23 2226   06/14/23 1000  vancomycin (VANCOREADY) IVPB 750 mg/150 mL  Status:  Discontinued        750 mg 150 mL/hr over 60 Minutes Intravenous Every 24 hours 06/14/23 0852 06/16/23 1022   06/13/23 1940   cefTRIAXone (ROCEPHIN) 2 g in sodium chloride 0.9 % 100 mL IVPB  Status:  Discontinued        2 g 200 mL/hr over 30 Minutes Intravenous 2 times daily 06/13/23 1940 06/14/23 0802   06/13/23 1830  cefTRIAXone (ROCEPHIN) 2 g in sodium chloride 0.9 % 100 mL IVPB  Status:  Discontinued        2 g 200 mL/hr over 30 Minutes Intravenous Every 24 hours 06/13/23 1803 06/13/23 1940   06/13/23 1445  vancomycin (VANCOCIN) 500 mg in sodium chloride 0.9 % 100 mL IVPB        500 mg 100 mL/hr  over 60 Minutes Intravenous  Once 06/13/23 1430 06/13/23 1627   06/13/23 1430  vancomycin (VANCOREADY) IVPB 500 mg/100 mL  Status:  Discontinued        500 mg 100 mL/hr over 60 Minutes Intravenous  Once 06/13/23 1426 06/13/23 1430   06/13/23 1400  vancomycin (VANCOCIN) IVPB 1000 mg/200 mL premix        1,000 mg 200 mL/hr over 60 Minutes Intravenous  Once 06/13/23 1354 06/13/23 1527   06/13/23 1345  ceFEPIme (MAXIPIME) 2 g in sodium chloride 0.9 % 100 mL IVPB        2 g 200 mL/hr over 30 Minutes Intravenous  Once 06/13/23 1335 06/13/23 1449       Procedures:   Consultants: PCCM Infectious Disease    Assessment and Plan: * MRSA bacteremia 07-02-2023 [facility] assumed care on 07-01-2024. Pt has spent the last 18 prior days on PCCM service. Transesophageal Echo negative for endocarditis. ID states pt will need 8 weeks of IV Vanco. Start date is 06-15-2023.  So end date of therapy is Aug 09, 2023  07-03-2023  continue with IV Zosyn thru 07-07-2023 and IV Vanco through Aug 09, 2023. End date of IV vanco is 08-09-2023.  CM looking into SNF for IV ABX. Pt turned down by CIR. Started on low dose coreg to help with his tachycardia. Continue to monitor.  Critical illness myopathy 07-03-2023 pt turned down by inpatient rehab. Pt is uninsured. CM will attempt SNF placement with potential hospital guarantee. Continue with PT/OT.  IVDU (intravenous drug user) 07-02-2023 pt states has used IV drugs in the past. But has been sniffing  heroin for the last 10 years.Discussed his long history of drug abuse. Pt states he was on suboxone in the past. He wants to try and get back on suboxone.  Discussed that his oxycodone dose would not be increased while he is in the hospital. And that he will not be given anymore IV opiates. Discussed with him that it would be best for him to get on suboxone therapy as soon as possible but he would need to stop all opiate therapy for 24-48 hours prior to starting suboxone. He states he will "think about it".  07-03-2023 pt unwilling to change to suboxone. Do NOT increase or give extra doses of oxycodone. Do NOT give any IV opiates/benzos. Start oxycodone taper next week.  Septic discitis of lumbar region 07-02-2023 TRH assumed care on 07-01-2024. Pt has spent the last 18 prior days on PCCM service. Transesophageal Echo negative for endocarditis. ID states pt will need 8 weeks of IV Vanco. Start date is 06-15-2023.  So end date of therapy is Aug 09, 2023   07-03-2023  continue with IV Zosyn thru 07-07-2023 and IV Vanco through Aug 09, 2023. End date of IV vanco is 08-09-2023.  CM looking into SNF for IV ABX. Pt turned down by CIR.  Acute respiratory failure with hypoxia (HCC) 07-02-2023 pt extubated on 06-30-2023. Currently on 2 L/min. Wean to RA if possible.  07-03-2023 continue to wean O2  Malnutrition of moderate degree Nutrition Status: Nutrition Problem: Moderate Malnutrition Etiology: chronic illness (polysubstance abuse) Signs/Symptoms: mild fat depletion, mild muscle depletion Interventions: Refer to RD note for recommendations, Tube feeding    Septic embolism (HCC) Present on admission.  Severe sepsis (HCC) Present on admission.  DVT prophylaxis: SCDs Start: 06/13/23 1755  Lovenox   Code Status: Full Code Family Communication: no family at bedside. Pt is decisional. Disposition Plan: SNF vs home Reason  for continuing need for hospitalization: remains on IV  Abx.  Objective: Vitals:   07/02/23 0724 07/02/23 1142 07/02/23 1956 07/03/23 0516  BP: (!) 144/86 132/79 128/72 130/79  Pulse: 100 (!) 106 (!) 105 (!) 101  Resp:   18 18  Temp: 99.3 F (37.4 C) 98.9 F (37.2 C) 98.8 F (37.1 C) (!) 97.5 F (36.4 C)  TempSrc: Oral Oral Oral Oral  SpO2: 98% 99% 96% 96%  Weight:    62.3 kg  Height:        Intake/Output Summary (Last 24 hours) at 07/03/2023 1139 Last data filed at 07/03/2023 0524 Gross per 24 hour  Intake 835.21 ml  Output 950 ml  Net -114.79 ml   Filed Weights   07/01/23 0416 07/02/23 0446 07/03/23 0516  Weight: 59 kg 61.4 kg 62.3 kg    Examination:  Physical Exam Vitals and nursing note reviewed.  Constitutional:      General: He is not in acute distress.    Appearance: He is not toxic-appearing.     Comments: Chronically ill appearing  HENT:     Head: Normocephalic and atraumatic.  Cardiovascular:     Rate and Rhythm: Regular rhythm. Tachycardia present.  Pulmonary:     Effort: Pulmonary effort is normal. No respiratory distress.     Breath sounds: Normal breath sounds.  Abdominal:     General: Bowel sounds are normal. There is no distension.     Palpations: Abdomen is soft.  Musculoskeletal:     Comments: Right UE PICC  Skin:    General: Skin is warm and dry.     Capillary Refill: Capillary refill takes less than 2 seconds.  Neurological:     Mental Status: He is oriented to person, place, and time.   Data Reviewed: I have personally reviewed following labs and imaging studies  CBC: Recent Labs  Lab 06/29/23 0536 06/30/23 0354 07/01/23 0413 07/02/23 0448 07/03/23 0407  WBC 10.6* 10.7* 13.9* 15.4* 15.2*  NEUTROABS  --   --  10.0* 11.8* 10.8*  HGB 8.3* 7.9* 8.3* 8.6* 8.5*  HCT 26.8* 25.8* 26.4* 27.8* 28.0*  MCV 87.0 89.9 88.0 88.0 89.7  PLT 391 345 388 427* 478*   Basic Metabolic Panel: Recent Labs  Lab 06/28/23 0648 06/29/23 0536 06/30/23 0354 07/01/23 0413 07/02/23 0448 07/03/23 0407   NA 139 138 139 137 137 135  K 3.5 3.9 3.6 3.2* 3.7 3.5  CL 99 99 101 99 101 98  CO2 31 29 29 29 27 29   GLUCOSE 135* 123* 166* 114* 131* 121*  BUN 26* 33* 45* 35* 25* 24*  CREATININE 1.07 1.16 1.41* 1.19 1.08 1.13  CALCIUM 7.8* 7.8* 8.0* 7.9* 8.4* 8.3*  MG 2.0 2.0 2.1 2.1 2.2 2.1  PHOS 5.1* 4.7* 5.5* 4.6 4.1  --    GFR: Estimated Creatinine Clearance: 69.7 mL/min (by C-G formula based on SCr of 1.13 mg/dL). Liver Function Tests: Recent Labs  Lab 07/01/23 0413 07/02/23 0448 07/03/23 0407  AST 51* 48* 46*  ALT 48* 49* 47*  ALKPHOS 108 117 110  BILITOT 0.6 0.5 0.2  PROT 8.3* 8.6* 8.3*  ALBUMIN 1.9* 1.9* 1.9*   CBG: Recent Labs  Lab 07/02/23 0721 07/02/23 1140 07/02/23 1636 07/02/23 2105 07/03/23 0719  GLUCAP 120* 146* 142* 105* 127*   Recent Results (from the past 240 hours)  Culture, Respiratory w Gram Stain     Status: None   Collection Time: 06/29/23  1:25 PM   Specimen: Tracheal Aspirate;  Respiratory  Result Value Ref Range Status   Specimen Description   Final    TRACHEAL ASPIRATE Performed at [facility], [address], [place], [place] [address]    Special Requests   Final    NONE Performed at [facility], [address] [place], [place] [address]    Gram Stain   Final    NO WBC SEEN RARE GRAM POSITIVE COCCI RARE GRAM NEGATIVE RODS    Culture   Final    ABUNDANT METHICILLIN RESISTANT STAPHYLOCOCCUS AUREUS FEW PSEUDOMONAS AERUGINOSA HEALTH DEPARTMENT NOTIFIED Performed at [facility], [address] [place], [place] [address]    Report Status 07/01/2023 FINAL  Final   Organism ID, Bacteria METHICILLIN RESISTANT STAPHYLOCOCCUS AUREUS  Final   Organism ID, Bacteria PSEUDOMONAS AERUGINOSA  Final      Susceptibility   Methicillin resistant staphylococcus aureus - MIC*    CIPROFLOXACIN >=8 RESISTANT Resistant     ERYTHROMYCIN >=8 RESISTANT Resistant     GENTAMICIN <=0.5 SENSITIVE Sensitive      OXACILLIN >=4 RESISTANT Resistant     TETRACYCLINE <=1 SENSITIVE Sensitive     VANCOMYCIN 1 SENSITIVE Sensitive     TRIMETH/SULFA >=320 RESISTANT Resistant     CLINDAMYCIN <=0.25 SENSITIVE Sensitive     RIFAMPIN <=0.5 SENSITIVE Sensitive     Inducible Clindamycin NEGATIVE Sensitive     LINEZOLID 2 SENSITIVE Sensitive     * ABUNDANT METHICILLIN RESISTANT STAPHYLOCOCCUS AUREUS   Pseudomonas aeruginosa - MIC*    CEFTAZIDIME 2 SENSITIVE Sensitive     CIPROFLOXACIN 2 RESISTANT Resistant     GENTAMICIN <=1 SENSITIVE Sensitive     IMIPENEM >=16 RESISTANT Resistant     PIP/TAZO <=4 SENSITIVE Sensitive ug/mL    CEFEPIME 2 SENSITIVE Sensitive     * FEW PSEUDOMONAS AERUGINOSA  Culture, blood (Routine X 2) w Reflex to ID Panel     Status: None (Preliminary result)   Collection Time: 06/30/23 10:22 AM   Specimen: BLOOD  Result Value Ref Range Status   Specimen Description   Final    BLOOD BLOOD LEFT HAND AEROBIC BOTTLE ONLY Performed at [facility], [address] [place], [place] [address]    Special Requests   Final    BOTTLES DRAWN AEROBIC ONLY Blood Culture results may not be optimal due to an inadequate volume of blood received in culture bottles Performed at [facility], [address], [place], [place] [address]    Culture   Final    NO GROWTH 2 DAYS Performed at [facility], [address], [place], [place] [address]    Report Status PENDING  Incomplete  Culture, blood (Routine X 2) w Reflex to ID Panel     Status: None (Preliminary result)   Collection Time: 06/30/23 10:28 AM   Specimen: BLOOD  Result Value Ref Range Status   Specimen Description   Final    BLOOD BLOOD LEFT HAND AEROBIC BOTTLE ONLY Performed at [facility], [address] [place], [place] [address]    Special Requests   Final    BOTTLES DRAWN AEROBIC ONLY Blood Culture results may not be optimal due to an inadequate volume of blood received in  culture bottles Performed at [facility], [address], [place], [place] [address]    Culture   Final    NO GROWTH 2 DAYS Performed at [facility], [address], [place],  [place] [address]    Report Status PENDING  Incomplete     Radiology Studies: No results found.  Scheduled Meds:  carvedilol  3.125 mg Oral BID WC   Chlorhexidine Gluconate Cloth  6 each Topical Daily   chlorpheniramine-HYDROcodone  5 mL Oral Q12H   clonazePAM  1 mg Oral BID   enoxaparin (LOVENOX) injection  60 mg Subcutaneous Q12H   feeding supplement  237 mL Oral TID BM   guaiFENesin  20 mL Oral BID   insulin aspart  0-5 Units Subcutaneous QHS   insulin aspart  0-9 Units Subcutaneous TID WC   lidocaine  1 patch Transdermal Q24H   multivitamin with minerals  1 tablet Oral Daily   oxyCODONE  10 mg Oral QID   pantoprazole  40 mg Oral Daily   polyethylene glycol  17 g Oral Daily   senna-docusate  2 tablet Oral BID   sodium chloride flush  10-40 mL Intracatheter Q12H   sodium chloride flush  3-10 mL Intravenous Q12H   thiamine  100 mg Oral Daily   Continuous Infusions:  piperacillin-tazobactam (ZOSYN)  IV 3.375 g (07/03/23 0524)   vancomycin (VANCOCIN) 500 mg in sodium chloride 0.9 % 100 mL IVPB Stopped (07/03/23 1137)     LOS: 20 days   Time spent: 45 minutes  Carollee Herter, DO  [facility]  07/03/2023, 11:39 AM

## 2023-07-03 NOTE — TOC Progression Note (Addendum)
 Transition of Care [facility]) - Progression Note    Patient Details  Name: Roe Wilner MRN: [id number] Date of Birth: [birth year]  Transition of Care [facility]) CM/SW Contact  Otelia Santee, LCSW Phone Number: 07/03/2023, 2:46 PM  Clinical Narrative:    Pt not eligible for CIR. Pt agreeable for SNF placement for rehab and continued IV abx treatment. Pt does not have insurance however, LOG can be offered for placement. Pt has been worked up for SNF and currently awaiting bed offers. Potential barriers to SNF being payor source, discharge plan following SNF, and recent substance use.      Barriers to Discharge: Continued Medical Work up  Expected Discharge Plan and Services     Post Acute Care Choice: NA Living arrangements for the past 2 months: Single Family Home                   DME Agency: NA                   Social Determinants of Health (SDOH) Interventions SDOH Screenings   Food Insecurity: Patient Declined (06/15/2023)  Housing: Patient Unable To Answer (06/29/2023)  Transportation Needs: Patient Unable To Answer (06/16/2023)  Utilities: Patient Unable To Answer (06/16/2023)  Tobacco Use: High Risk (06/29/2023)    Readmission Risk Interventions    06/14/2023    8:46 AM  Readmission Risk Prevention Plan  Post Dischage Appt Complete  Medication Screening Complete  Transportation Screening Complete

## 2023-07-03 NOTE — Progress Notes (Signed)
  Inpatient Rehabilitation Admissions Coordinator   I spoke with patient by phone for rehab assessment. 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 Uninsured. Has no caregiver supports in [place]. Parents live in [place]. He states he is looking to go to a facility to get stronger and then to go to [place] to be with his parents. He is no longer renting his previous home, since admitted to the hospital. He states he relapsed 3 1/2 weeks ago and "sniffed" heroin. Is also on suboxone. Noted 12/24 relapse and sent to ED. He needs prolonged rehab recovery and other rehab venues should be pursued.  Please call me with any questions.   Ottie Glazier, RN, MSN Rehab Admissions Coordinator [phone]

## 2023-07-03 NOTE — Progress Notes (Signed)
 [facility]  Date of Admission:  06/13/2023     Lines: 3/13-c Rue picc   Abx: 3/25-c piptazo 3/07-c vancomycin   3/19-3/25 cipro 3/17-19 meropenem 3/07-09 ceftriaxone   ASSESSMENT: 48 yo male polysubstance abuse (heroin/fentanyl), biba for ams found to have severe sepsis and mrsa bacteremia, complicated by respiratory failure  Other ID problems include: Bilateral pulm nodules presumed mrsa L5 vertebral om with epidural phlegmon Ongoing fever and HAP with pseudomonas aeruginosa Rash on cefepime  Other issues: Left pneumothorax s/p chest tube 3/14-3/19 Bilateral common iliac veins filling defects on abd pelv ct -- on tx dose enoxoparin Resp failure extubated 3/24    #mrsa bsi #bilateral pulm septic nodules #l4-5 vertebral om and epidural phlegmon 3/07 bcx mrsa 3/07 urine cx negative 3/09 bcx negative  3/08 Tte and 3/13 tee no sign of valve vegetation 3/14 abd pelv ct with contrast bilateral common iliac vein filling defect but no occult abscess 3/15 thoracic and lumbar spine L4-5 vertebral om and epidural phlegmon  3/25 last fever before transitioning to piptazo  3/27 overall tired but no new focal pain. Stable 3-4 liters nasal canula o2 supplement since extubation   -continue vanc -will need prolong treatment at least 8 weeks abx given vertebral osteomyelitis -primary team planning disposition; patient is not opat candidate. If he goes to snf or stay inpatient, I plan 4 weeks iv abx until 07/13/2023 then finish the rest of the 8 weeks with (2 weeks linezolid until 08/06/23, and then 2 weeks doxycycline). Longer abx requirement pending further evaluation    #hap/vap 3/16 sputum cx with PsA (S cefepime/ceftaz,cipro, imipenem); and mrsa 3/23 repeat sputum cx pseudomonas resistant to cipro/imipenem  patient takes amox previously -- our pharmacy team confirmed with his mother Switched cipro to piptazo 3/25 but fever curve was  already better  -Planned 7 days piptazo until 07/07/23    #ongoing fever No sign of drug related fevever 3/23 resp cx still with mrsa and pseudomonas (R cipro/imipenem) 3/24 repeat bcx negative  ?hap vs occult abscess somewhere due to mrsa; he does have new reactive thrombocytosis and still mild-moderate leukocytosis 13-15 on 07/03/23  -if recurrent fever the next few days plan repeat abd-pelv ct     #ams resolved Initially at admission Head ct unremarkable 3/07 lp me panel negative; no cx; protein >600; glucose 87; wbc clotted   PLAN: finish 7 days 3/25-3/31 piptazo Given prior hx ivdu this is an abx plan I recommend for mrsa: If he goes to snf or stay inpatient, I plan 4 weeks iv abx until 07/13/2023 then finish the rest of the 8 weeks with (2 weeks linezolid until 08/06/23, and then 2 weeks doxycycline). Longer abx requirement pending further evaluation Maintain contact infection prevention Discussed with primary team      Principal Problem:   MRSA bacteremia Active Problems:   Severe sepsis (HCC)   Septic embolism (HCC)   Malnutrition of moderate degree   Acute respiratory failure with hypoxia (HCC)   Septic discitis of lumbar region   IVDU (intravenous drug user)   Critical illness myopathy   Allergies  Allergen Reactions   Cefepime Rash    Scheduled Meds:  carvedilol  3.125 mg Oral BID WC   Chlorhexidine Gluconate Cloth  6 each Topical Daily   chlorpheniramine-HYDROcodone  5 mL Oral Q12H   clonazePAM  1 mg Oral BID   enoxaparin (LOVENOX) injection  60 mg Subcutaneous Q12H   feeding supplement  237 mL Oral TID BM   guaiFENesin  20 mL Oral BID   insulin aspart  0-5 Units Subcutaneous QHS   insulin aspart  0-9 Units Subcutaneous TID WC   lidocaine  1 patch Transdermal Q24H   multivitamin with minerals  1 tablet Oral Daily   oxyCODONE  10 mg Oral QID   pantoprazole  40 mg Oral Daily   polyethylene glycol  17 g Oral Daily   senna-docusate  2 tablet Oral  BID   sodium chloride flush  10-40 mL Intracatheter Q12H   sodium chloride flush  3-10 mL Intravenous Q12H   thiamine  100 mg Oral Daily   Continuous Infusions:  piperacillin-tazobactam (ZOSYN)  IV 3.375 g (07/03/23 1323)   vancomycin (VANCOCIN) 500 mg in sodium chloride 0.9 % 100 mL IVPB Stopped (07/03/23 1137)   PRN Meds:.acetaminophen (TYLENOL) oral liquid 160 mg/5 mL, acetaminophen, albuterol, bisacodyl, ibuprofen, lidocaine, methocarbamol, metoprolol tartrate, mouth rinse, mouth rinse, mouth rinse, phenol, sodium chloride flush, sodium chloride flush   SUBJECTIVE: Stable no change the last 2 days No focal pain Weak Eating Doing 1 hour pt/ot today No fever since 3/25  Cr stable; mild lft elevation no abd pain; stable leukocytosis 13-15 and new thrombocytosis   Review of Systems: ROS All other ROS was negative, except mentioned above     OBJECTIVE: Vitals:   07/02/23 1142 07/02/23 1956 07/03/23 0516 07/03/23 1156  BP: 132/79 128/72 130/79 118/79  Pulse: (!) 106 (!) 105 (!) 101 (!) 116  Resp:  18 18   Temp: 98.9 F (37.2 C) 98.8 F (37.1 C) (!) 97.5 F (36.4 C) 97.6 F (36.4 C)  TempSrc: Oral Oral Oral Oral  SpO2: 99% 96% 96% 97%  Weight:   62.3 kg   Height:       Body mass index is 19.16 kg/m.  Physical Exam General/constitutional: no distress, pleasant; on 4 liters o2 [place] supplement HEENT: Normocephalic, PER, Conj Clear, EOMI, Oropharynx clear CV: rrr no mrg Lungs: normal respiratory effort Abd: Soft, Nontender Ext: no edema Skin: No Rash Neuro: nonfocal -- generalized weakness MSK: no peripheral joint swelling/tenderness/warmth   Central line presence: right upper ext picc site no erythema/purulence/tenderness   Lab Results Lab Results  Component Value Date   WBC 15.2 (H) 07/03/2023   HGB 8.5 (L) 07/03/2023   HCT 28.0 (L) 07/03/2023   MCV 89.7 07/03/2023   PLT 478 (H) 07/03/2023    Lab Results  Component Value Date   CREATININE 1.13  07/03/2023   BUN 24 (H) 07/03/2023   NA 135 07/03/2023   K 3.5 07/03/2023   CL 98 07/03/2023   CO2 29 07/03/2023    Lab Results  Component Value Date   ALT 47 (H) 07/03/2023   AST 46 (H) 07/03/2023   ALKPHOS 110 07/03/2023   BILITOT 0.2 07/03/2023      Microbiology: Recent Results (from the past 240 hours)  Culture, Respiratory w Gram Stain     Status: None   Collection Time: 06/29/23  1:25 PM   Specimen: Tracheal Aspirate; Respiratory  Result Value Ref Range Status   Specimen Description   Final    TRACHEAL ASPIRATE Performed at [facility], [address], [place], [place] [address]    Special Requests   Final    NONE Performed at [facility], [address], [place], [place] [address]    Gram Stain   Final    NO WBC SEEN RARE GRAM POSITIVE COCCI  RARE GRAM NEGATIVE RODS    Culture   Final    ABUNDANT METHICILLIN RESISTANT STAPHYLOCOCCUS AUREUS FEW PSEUDOMONAS AERUGINOSA HEALTH DEPARTMENT NOTIFIED Performed at [facility], [address], [place], [place] [address]    Report Status 07/01/2023 FINAL  Final   Organism ID, Bacteria METHICILLIN RESISTANT STAPHYLOCOCCUS AUREUS  Final   Organism ID, Bacteria PSEUDOMONAS AERUGINOSA  Final      Susceptibility   Methicillin resistant staphylococcus aureus - MIC*    CIPROFLOXACIN >=8 RESISTANT Resistant     ERYTHROMYCIN >=8 RESISTANT Resistant     GENTAMICIN <=0.5 SENSITIVE Sensitive     OXACILLIN >=4 RESISTANT Resistant     TETRACYCLINE <=1 SENSITIVE Sensitive     VANCOMYCIN 1 SENSITIVE Sensitive     TRIMETH/SULFA >=320 RESISTANT Resistant     CLINDAMYCIN <=0.25 SENSITIVE Sensitive     RIFAMPIN <=0.5 SENSITIVE Sensitive     Inducible Clindamycin NEGATIVE Sensitive     LINEZOLID 2 SENSITIVE Sensitive     * ABUNDANT METHICILLIN RESISTANT STAPHYLOCOCCUS AUREUS   Pseudomonas aeruginosa - MIC*    CEFTAZIDIME 2 SENSITIVE Sensitive     CIPROFLOXACIN 2 RESISTANT Resistant      GENTAMICIN <=1 SENSITIVE Sensitive     IMIPENEM >=16 RESISTANT Resistant     PIP/TAZO <=4 SENSITIVE Sensitive ug/mL    CEFEPIME 2 SENSITIVE Sensitive     * FEW PSEUDOMONAS AERUGINOSA  Culture, blood (Routine X 2) w Reflex to ID Panel     Status: None (Preliminary result)   Collection Time: 06/30/23 10:22 AM   Specimen: BLOOD  Result Value Ref Range Status   Specimen Description   Final    BLOOD BLOOD LEFT HAND AEROBIC BOTTLE ONLY Performed at [facility], [address], [place], [place] [address]    Special Requests   Final    BOTTLES DRAWN AEROBIC ONLY Blood Culture results may not be optimal due to an inadequate volume of blood received in culture bottles Performed at [facility], [address] [place], [place] [address]    Culture   Final    NO GROWTH 3 DAYS Performed at [facility], [address], [place], [place] [address]    Report Status PENDING  Incomplete  Culture, blood (Routine X 2) w Reflex to ID Panel     Status: None (Preliminary result)   Collection Time: 06/30/23 10:28 AM   Specimen: BLOOD  Result Value Ref Range Status   Specimen Description   Final    BLOOD BLOOD LEFT HAND AEROBIC BOTTLE ONLY Performed at [facility], [address] [place], [place] [address]    Special Requests   Final    BOTTLES DRAWN AEROBIC ONLY Blood Culture results may not be optimal due to an inadequate volume of blood received in culture bottles Performed at [facility], [address], [place], [place] [address]    Culture   Final    NO GROWTH 3 DAYS Performed at [facility], [address], [place], [place] [address]    Report Status PENDING  Incomplete     Serology:   Imaging: If present, new imagings (plain films, ct scans, and mri) have been personally visualized and interpreted; radiology reports have been reviewed. Decision making incorporated into the Impression /  Recommendations.  06/21/23 mri lumbar spine wwo contrast 1. Abnormal edema and enhancement affecting the L5 vertebral body which could go along with osteomyelitis. The L4-5 and L5-S1 disc  spaces are normal. 2. Abnormal appearance of the paravertebral soft tissues surrounding L5 including the epidural space at L5-S1 with edema and enhancement consistent with inflammatory change. The epidural changes are phlegmonous at this point in time without what I would characterize as a frank epidural abscess.   3/15 mri thoracic spine wwo contrast 1. No evidence of discitis, osteomyelitis or septic facet arthritis. 2. Extensive patchy bilateral pulmonary pathology, not well evaluated using this technique.   3/14 ct abd pelv with contrast 1. Incompletely visualized small to moderate left pneumothorax at the left lung base. 2. Negative for rim enhancing fluid collection within the abdomen or pelvis to suggest drainable abscess. 3. Contracted gallbladder with possible wall thickening but no calcified stones. 4. Persistent bilateral lower lobe consolidations with areas of heterogeneous low density suggestive of necrotic infection. Multiple cavitary lung lesions corresponding to history of septic emboli. Small bilateral pleural effusions. 5. Suspicion of hypodense filling defect within the bilateral common iliac veins right greater than left, suspicious for DVT. 6. Question small volume low-density fluid along the right anterior margins of the L4 and L5 vertebral bodies. No overt osseous destructive change or abnormal appearance of the disc spaces but presence of spine infection cannot be excluded and correlation with MRI should be considered. 7. Small free fluid within the abdomen and pelvis. Presacral soft tissue stranding and small volume fluid. Mild generalized subcutaneous edema. 8. Slightly thick-walled urinary bladder, decompressed by Foley catheter.    3/13 tee  1. Left  ventricular ejection fraction, by estimation, is 45 to 50%. The  left ventricle has mildly decreased function.   2. Right ventricular systolic function is normal. The right ventricular  size is normal.   3. No left atrial/left atrial appendage thrombus was detected.   4. The mitral valve is grossly normal. Trivial mitral valve  regurgitation.   5. The aortic valve is tricuspid. Aortic valve regurgitation is not  visualized.   6. Agitated saline contrast bubble study was positive with shunting  observed within 3-6 cardiac cycles suggestive of interatrial shunt, Grade  1.   Conclusion(s)/Recommendation(s): No evidence of vegetation/infective  endocarditis on this transesophageael echocardiogram.      3/9 ct chest without contrast 1. Very extensive heterogeneous and consolidative airspace disease throughout the lungs, much of this nodular in appearance, with several cavitary nodules present. Findings are highly suspicious for septic emboli given stated clinical concern. 2. Small bilateral pleural effusions. 3. Cardiomegaly.   3/08 tte  1. Left ventricular ejection fraction, by estimation, is 40 to 45%. The  left ventricle has mildly decreased function. The left ventricle  demonstrates global hypokinesis. The left ventricular internal cavity size  was mildly dilated. There is mild left ventricular hypertrophy. Left ventricular diastolic parameters were normal.   2. Right ventricular systolic function is moderately reduced. The right  ventricular size is moderately enlarged. Tricuspid regurgitation signal is  inadequate for assessing PA pressure.   3. The mitral valve is abnormal. Trivial mitral valve regurgitation. No  evidence of mitral stenosis.   4. The aortic valve is tricuspid. Aortic valve regurgitation is not  visualized. No aortic stenosis is present.   5. The inferior vena cava is normal in size with greater than 50%  respiratory variability, suggesting right atrial  pressure of 3 mmHg.   6. Agitated saline contrast bubble study was negative, with no evidence  of any interatrial shunt.      Raymondo Band, MD [facility]  [phone] pager    07/03/2023, 3:23 PM

## 2023-07-03 NOTE — Progress Notes (Signed)
 RUE Triple Lumen PICC Routine line care: Redness noted at insertion site. No drainage or induration noted. Discussed with MD, recommended going down to single lumen. Order received, MD aware line to be placed 3/28.

## 2023-07-04 DIAGNOSIS — R000 Tachycardia, unspecified: Secondary | ICD-10-CM

## 2023-07-04 LAB — CBC WITH DIFFERENTIAL/PLATELET
Abs Immature Granulocytes: 0.18 10*3/uL — ABNORMAL HIGH (ref 0.00–0.07)
Basophils Absolute: 0 10*3/uL (ref 0.0–0.1)
Basophils Relative: 0 %
Eosinophils Absolute: 0.2 10*3/uL (ref 0.0–0.5)
Eosinophils Relative: 1 %
HCT: 25.7 % — ABNORMAL LOW (ref 39.0–52.0)
Hemoglobin: 8 g/dL — ABNORMAL LOW (ref 13.0–17.0)
Immature Granulocytes: 1 %
Lymphocytes Relative: 24 %
Lymphs Abs: 3.2 10*3/uL (ref 0.7–4.0)
MCH: 27.3 pg (ref 26.0–34.0)
MCHC: 31.1 g/dL (ref 30.0–36.0)
MCV: 87.7 fL (ref 80.0–100.0)
Monocytes Absolute: 0.8 10*3/uL (ref 0.1–1.0)
Monocytes Relative: 6 %
Neutro Abs: 8.9 10*3/uL — ABNORMAL HIGH (ref 1.7–7.7)
Neutrophils Relative %: 68 %
Platelets: 501 10*3/uL — ABNORMAL HIGH (ref 150–400)
RBC: 2.93 MIL/uL — ABNORMAL LOW (ref 4.22–5.81)
RDW: 16.5 % — ABNORMAL HIGH (ref 11.5–15.5)
WBC: 13.2 10*3/uL — ABNORMAL HIGH (ref 4.0–10.5)
nRBC: 0 % (ref 0.0–0.2)

## 2023-07-04 LAB — GLUCOSE, CAPILLARY: Glucose-Capillary: 130 mg/dL — ABNORMAL HIGH (ref 70–99)

## 2023-07-04 LAB — BASIC METABOLIC PANEL WITH GFR
Anion gap: 7 (ref 5–15)
BUN: 23 mg/dL — ABNORMAL HIGH (ref 6–20)
CO2: 29 mmol/L (ref 22–32)
Calcium: 8.1 mg/dL — ABNORMAL LOW (ref 8.9–10.3)
Chloride: 97 mmol/L — ABNORMAL LOW (ref 98–111)
Creatinine, Ser: 1.04 mg/dL (ref 0.61–1.24)
GFR, Estimated: >60 mL/min (ref >60)
Glucose, Bld: 119 mg/dL — ABNORMAL HIGH (ref 70–99)
Potassium: 3.3 mmol/L — ABNORMAL LOW (ref 3.5–5.1)
Sodium: 133 mmol/L — ABNORMAL LOW (ref 135–145)

## 2023-07-04 LAB — VANCOMYCIN, PEAK: Vancomycin Pk: 20 ug/mL — ABNORMAL LOW (ref 30–40)

## 2023-07-04 LAB — VANCOMYCIN, TROUGH: Vancomycin Tr: 11 ug/mL — ABNORMAL LOW (ref 15–20)

## 2023-07-04 MED ORDER — VANCOMYCIN HCL 1250 MG/250ML IV SOLN
1250.0000 mg | INTRAVENOUS | Status: DC
  Administered 2023-07-05 – 2023-07-07 (×3): 1250 mg via INTRAVENOUS
  Filled 2023-07-04 (×3): qty 250

## 2023-07-04 MED ORDER — ENSURE MAX PROTEIN PO LIQD
11.0000 [oz_av] | Freq: Three times a day (TID) | ORAL | Status: DC
  Filled 2023-07-04 (×2): qty 330

## 2023-07-04 MED ORDER — POTASSIUM CHLORIDE CRYS ER 20 MEQ PO TBCR
40.0000 meq | EXTENDED_RELEASE_TABLET | ORAL | Status: AC
Start: 2023-07-04 — End: 2023-07-04
  Administered 2023-07-04 (×2): 40 meq via ORAL
  Filled 2023-07-04 (×2): qty 2

## 2023-07-04 NOTE — Plan of Care (Signed)

## 2023-07-04 NOTE — Progress Notes (Signed)
 Peripherally Inserted Central Catheter Placement  The IV Nurse has discussed with the patient and/or persons authorized to consent for the patient, the purpose of this procedure and the potential benefits and risks involved with this procedure.  The benefits include less needle sticks, lab draws from the catheter, and the patient may be discharged home with the catheter. Risks include, but not limited to, infection, bleeding, blood clot (thrombus formation), and puncture of an artery; nerve damage and irregular heartbeat and possibility to perform a PICC exchange if needed/ordered by physician.  Alternatives to this procedure were also discussed.  Bard Power PICC patient education guide, fact sheet on infection prevention and patient information card has been provided to patient /or left at bedside.    PICC Placement Documentation  PICC Single Lumen 07/04/23 Left Brachial 39 cm 0 cm (Active)  Indication for Insertion or Continuance of Line Prolonged intravenous therapies 07/04/23 1400  Exposed Catheter (cm) 0 cm 07/04/23 1400  Site Assessment Clean, Dry, Intact 07/04/23 1400  Line Status Saline locked;Blood return noted 07/04/23 1400  Dressing Type Transparent;Securing device 07/04/23 1400  Dressing Status Antimicrobial disc/dressing in place;Clean, Dry, Intact 07/04/23 1400  Line Care Connections checked and tightened 07/04/23 1400  Line Adjustment (NICU/IV Team Only) No 07/04/23 1400  Dressing Intervention New dressing 07/04/23 1400  Dressing Change Due 07/11/23 07/04/23 1400       Burnard Bunting Chenice 07/04/2023, 2:06 PM

## 2023-07-04 NOTE — Assessment & Plan Note (Deleted)
 07-04-2023 likely due to severe deconditioning. Improved with coreg 6.25 mg bid.  07-05-2023 resolved with coreg 6.25 mg bid  07-06-2023 HR/BP stable on coreg 6.25 mg bid.  07-06-2023 stable on coreg 6.25 mg bid.  07-08-2023 stable on coreg 6.25 mg bid.

## 2023-07-04 NOTE — Progress Notes (Signed)
 PROGRESS NOTE    Brendan Preston  WUJ:[id number] DOB: 1975-12-07 DOA: 06/13/2023 PCP: Patient, No Pcp Per  Subjective: Pt seen and examined. HR improved with increase in coreg to 6.25 mg bid. Awaiting to hear from CM about possibility of pt going to SNF with IV ABX.  Pt states he sat in recliner for 1 hour yesterday. Encouraged him that any forward progress, not matter how small, was still forward progress.  Plan for pt's 3-lumen right UE PICC to be downsized to single lumen PICC today.   Hospital Course: HPI: Pt encephalopathic, therefore HPI obtained from chart review.  See additional chart w/MRN [id number]   61 yoM with prior hx of known polysubstance (heroin/ fentanyl) abuse in which EMS was called out for AMS, last seen awake 3 days ago covered in urine and feces.    In ER, temp 104.3 rectal, tachycardic, normo to slightly hypertensive, sats 100% on NRB> but maintaining sats on RA with GCS ~10 but protecting airway.  Labs significant for lactic 8.4, WBC 15, plts 96, VBG 7.29/ 44,  CXR neg, RVP neg, EKG without ST changes, qtc borderline, CTH official read pending, trop hs 694, INR 1.3.  Foley placed- urine cloudy, glucose 150, Hgb 150 (-RBC), neg leuks/nitrates, WBC 6-10, UDS positive for THC and opiates.  CMET and other labs had to be redrawn and still awaiting due to hemolysis.  Cultures sent, empiric vanc/ cefepime given, 2L LR.  PCCM called for admit.  Significant Events: Admitted 06/13/2023 for severe sepsis 3/7 admit, encephalopathic, altered, drug rash developed after cefepime. BLOOD UCLURE MRSA 3/8 TTE negative for endocarditis 3/10 he had a TEE scheduled but sounds like he has declined this . Larey Seat out of bed late day shift and broke his nose. Bcx still pos. 3/11 Tubed in afternoon. CRP high.  3/12 changing his pain reg/ PAD 3/13 on vent 3/14 CT abdomen showed left pneumothorax, bilateral lower lobe consolidation, multiple cavitary lesions, suspicious for bilateral common iliac  DVT, fluid collection along L4/L5 vertebral bodies. 3/14 left pigtail chest tube for pneumothorax 3/15 started on IV heparin, venous duplex negative, MRI lumbar spine suggests L5 osteo/no epidural abscess 3/17 changing IV heparin to LMWH as heparin levels not detectable. Still having marked trach secretions. Growing PA so added meropenem (given his rash w/ cefepime). Changed fent to dilaudid at 2mg /hr. CT clamped.  3/18 hgb down to 6.6 got 1 unit PRBC. No obvious bleeding source. Was >13 liters positive so also starting diuretics. Dec'd dilaudid dose to 1.5mg /hr. no PTX so CT removed.  3/19 tolerated 7 hours PSV 10/5 but had quite a lot of secretions requiring frequent suctioning CHES TTUBE REMOVED 3/21 - Temp 99.5. On 30% FiO2. Diuresed well overnight, 5L past 24 hours but still +8L net since admit. 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 On and off low grade fever 101F. On vent 30% fio2/ Per RN and patient - c/p severe cough an donly lidocaine neb helps. C/oc copious resp secretions. PER RN - due to copious secretions patient heading for trach 3/24 extubated 07-02-2023 care transferred to [facility] 07-03-2023 Pt turned down by CIR.  Antibiotic Therapy: Anti-infectives (From admission, onward)    Start     Dose/Rate Route Frequency Ordered Stop   07/01/23  1515  piperacillin-tazobactam (ZOSYN) IVPB 3.375 g        3.375 g 12.5 mL/hr over 240 Minutes Intravenous Every 8 hours 07/01/23 1415     06/30/23 1400  ciprofloxacin (CIPRO) IVPB 400 mg  Status:  Discontinued        400 mg 200 mL/hr over 60 Minutes Intravenous Every 8 hours 06/30/23 1224 07/01/23 1415   06/27/23 2200  vancomycin (VANCOREADY) IVPB 500 mg/100 mL  Status:   Discontinued        500 mg 100 mL/hr over 60 Minutes Intravenous Every 12 hours 06/27/23 1140 06/27/23 1619   06/27/23 2200  vancomycin (VANCOREADY) IVPB 500 mg/100 mL  Status:  Discontinued        500 mg 100 mL/hr over 60 Minutes Intravenous Every 12 hours 06/27/23 1619 06/27/23 1619   06/27/23 2200  vancomycin (VANCOCIN) 500 mg in sodium chloride 0.9 % 100 mL IVPB        500 mg 100 mL/hr over 60 Minutes Intravenous Every 12 hours 06/27/23 1623     06/25/23 1400  ciprofloxacin (CIPRO) IVPB 400 mg        400 mg 200 mL/hr over 60 Minutes Intravenous Every 8 hours 06/25/23 0748 06/29/23 2329   06/23/23 2200  meropenem (MERREM) 1 g in sodium chloride 0.9 % 100 mL IVPB  Status:  Discontinued        1 g 200 mL/hr over 30 Minutes Intravenous Every 8 hours 06/23/23 1108 06/25/23 0748   06/23/23 1130  meropenem (MERREM) 1 g in sodium chloride 0.9 % 100 mL IVPB        1 g 200 mL/hr over 30 Minutes Intravenous  Once 06/23/23 1057 06/23/23 1140   06/17/23 1000  vancomycin (VANCOREADY) IVPB 1250 mg/250 mL  Status:  Discontinued        1,250 mg 166.7 mL/hr over 90 Minutes Intravenous Every 24 hours 06/17/23 0709 06/17/23 0818   06/17/23 0900  vancomycin (VANCOREADY) IVPB 750 mg/150 mL  Status:  Discontinued        750 mg 150 mL/hr over 60 Minutes Intravenous Every 12 hours 06/17/23 0818 06/27/23 1140   06/16/23 1030  vancomycin (VANCOCIN) IVPB 1000 mg/200 mL premix  Status:  Discontinued        1,000 mg 200 mL/hr over 60 Minutes Intravenous Every 24 hours 06/16/23 1022 06/17/23 0709   06/15/23 1345  cefTRIAXone (ROCEPHIN) 2 g in sodium chloride 0.9 % 100 mL IVPB  Status:  Discontinued        2 g 200 mL/hr over 30 Minutes Intravenous Every 24 hours 06/15/23 1249 06/15/23 2226   06/14/23 1000  vancomycin (VANCOREADY) IVPB 750 mg/150 mL  Status:  Discontinued        750 mg 150 mL/hr over 60 Minutes Intravenous Every 24 hours 06/14/23 0852 06/16/23 1022   06/13/23 1940  cefTRIAXone (ROCEPHIN) 2 g in  sodium chloride 0.9 % 100 mL IVPB  Status:  Discontinued        2 g 200 mL/hr over 30 Minutes Intravenous 2 times daily 06/13/23 1940 06/14/23 0802   06/13/23 1830  cefTRIAXone (ROCEPHIN) 2 g in sodium chloride 0.9 % 100 mL IVPB  Status:  Discontinued        2 g 200 mL/hr over 30 Minutes Intravenous Every 24 hours 06/13/23 1803 06/13/23 1940   06/13/23 1445  vancomycin (VANCOCIN) 500 mg in sodium chloride 0.9 % 100 mL IVPB        500 mg 100 mL/hr over  60 Minutes Intravenous  Once 06/13/23 1430 06/13/23 1627   06/13/23 1430  vancomycin (VANCOREADY) IVPB 500 mg/100 mL  Status:  Discontinued        500 mg 100 mL/hr over 60 Minutes Intravenous  Once 06/13/23 1426 06/13/23 1430   06/13/23 1400  vancomycin (VANCOCIN) IVPB 1000 mg/200 mL premix        1,000 mg 200 mL/hr over 60 Minutes Intravenous  Once 06/13/23 1354 06/13/23 1527   06/13/23 1345  ceFEPIme (MAXIPIME) 2 g in sodium chloride 0.9 % 100 mL IVPB        2 g 200 mL/hr over 30 Minutes Intravenous  Once 06/13/23 1335 06/13/23 1449       Procedures:   Consultants: PCCM Infectious Disease    Assessment and Plan: * MRSA bacteremia 07-02-2023 [facility] assumed care on 07-01-2024. Pt has spent the last 18 prior days on PCCM service. Transesophageal Echo negative for endocarditis. ID states pt will need 8 weeks of IV Vanco. Start date is 06-15-2023.  So end date of therapy is Aug 09, 2023  07-03-2023  continue with IV Zosyn thru 07-07-2023 and IV Vanco through Aug 09, 2023. End date of IV vanco is 08-09-2023.  CM looking into SNF for IV ABX. Pt turned down by CIR. Started on low dose coreg to help with his tachycardia. Continue to monitor.  07-04-2023  continue with IV Zosyn thru 07-07-2023 and IV Vanco through Aug 09, 2023. End date of IV vanco is 08-09-2023.  CM looking into SNF for IV ABX. Pt turned down by CIR.  (Per ID, If pt goes to snf or stay inpatient, then plan 4 weeks iv abx until 07/13/2023 then finish the rest of the 8 weeks with 2 weeks  linezolid until 08/06/23, and then 2 weeks doxycycline).   Sinus tachycardia 07-04-2023 likely due to severe deconditioning. Improved with coreg 6.25 mg bid.  Critical illness myopathy 07-03-2023 pt turned down by inpatient rehab. Pt is uninsured. CM will attempt SNF placement with potential hospital guarantee. Continue with PT/OT.  07-04-2023 CM still looking for SNF bed.  IVDU (intravenous drug user) 07-02-2023 pt states has used IV drugs in the past. 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 Pt states he was on suboxone in the past. He wants to try and get back on suboxone.  Discussed that his oxycodone dose would not be increased while he is in the hospital. And that he will not be given anymore IV opiates. Discussed with him that it would be best for him to get on suboxone therapy as soon as possible but he would need to stop all opiate therapy for 24-48 hours prior to starting suboxone. He states he will "think about it".  07-03-2023 pt unwilling to change to suboxone. Do NOT increase or give extra doses of oxycodone. Do NOT give any IV opiates/benzos. Start oxycodone taper next week.  Septic discitis of lumbar region 07-02-2023 [facility] assumed care on 07-01-2024. Pt has spent the last 18 prior days on PCCM service. Transesophageal Echo negative for endocarditis. ID states pt will need 8 weeks of IV Vanco. Start date is 06-15-2023.  So end date of therapy is Aug 09, 2023   07-03-2023  continue with IV Zosyn thru 07-07-2023 and IV Vanco through Aug 09, 2023. End date of IV vanco is 08-09-2023.  CM looking into SNF for IV ABX. Pt turned down by CIR.  07-04-2023  continue with IV Zosyn thru  07-07-2023 and IV Vanco through Aug 09, 2023. End date of IV vanco is 08-09-2023.  CM looking into SNF for IV ABX. Pt turned down by CIR.  (Per ID, If pt goes to snf or stay inpatient, then plan 4 weeks iv abx until 07/13/2023 then finish the rest of the 8 weeks with 2 weeks  linezolid until 08/06/23, and then 2 weeks doxycycline).   Acute respiratory failure with hypoxia (HCC) 07-02-2023 pt extubated on 06-30-2023. Currently on 2 L/min. Wean to RA if possible.  07-03-2023 continue to wean O2  07-04-2023 wean O2 if possible.  Malnutrition of moderate degree Nutrition Status: Nutrition Problem: Moderate Malnutrition Etiology: chronic illness (polysubstance abuse) Signs/Symptoms: mild fat depletion, mild muscle depletion Interventions: Refer to RD note for recommendations, Tube feeding    Septic embolism (HCC) Present on admission.  Severe sepsis (HCC) Present on admission.   DVT prophylaxis: SCDs Start: 06/13/23 1755  Lovenox   Code Status: Full Code Family Communication: no family at bedside. Pt is decisional Disposition Plan: return home Reason for continuing need for hospitalization: remains on IV ABX.  Objective: Vitals:   07/04/23 0133 07/04/23 0135 07/04/23 0529 07/04/23 0849  BP:  97/60 109/68 111/73  Pulse: 86 87  99  Resp: 16 16 16    Temp: 97.9 F (36.6 C) 97.9 F (36.6 C) 98.2 F (36.8 C)   TempSrc:      SpO2: 97% 97% 96%   Weight:      Height:        Intake/Output Summary (Last 24 hours) at 07/04/2023 1043 Last data filed at 07/04/2023 0900 Gross per 24 hour  Intake 691.9 ml  Output --  Net 691.9 ml   Filed Weights   07/01/23 0416 07/02/23 0446 07/03/23 0516  Weight: 59 kg 61.4 kg 62.3 kg   Examination:  Physical Exam Vitals and nursing note reviewed.  Constitutional:      Comments: Chronically ill appearing  HENT:     Head: Normocephalic and atraumatic.  Cardiovascular:     Rate and Rhythm: Normal rate and regular rhythm.  Pulmonary:     Effort: Pulmonary effort is normal.  Abdominal:     General: There is no distension.     Palpations: Abdomen is soft.  Musculoskeletal:     Comments: Right UE PICC  Skin:    Capillary Refill: Capillary refill takes less than 2 seconds.  Neurological:     Mental Status:  He is alert and oriented to person, place, and time.   Data Reviewed: I have personally reviewed following labs and imaging studies  CBC: Recent Labs  Lab 06/30/23 0354 07/01/23 0413 07/02/23 0448 07/03/23 0407 07/04/23 0006  WBC 10.7* 13.9* 15.4* 15.2* 13.2*  NEUTROABS  --  10.0* 11.8* 10.8* 8.9*  HGB 7.9* 8.3* 8.6* 8.5* 8.0*  HCT 25.8* 26.4* 27.8* 28.0* 25.7*  MCV 89.9 88.0 88.0 89.7 87.7  PLT 345 388 427* 478* 501*   Basic Metabolic Panel: Recent Labs  Lab 06/28/23 0648 06/29/23 0536 06/30/23 0354 07/01/23 0413 07/02/23 0448 07/03/23 0407 07/04/23 0006  NA 139 138 139 137 137 135 133*  K 3.5 3.9 3.6 3.2* 3.7 3.5 3.3*  CL 99 99 101 99 101 98 97*  CO2 31 29 29 29 27 29 29   GLUCOSE 135* 123* 166* 114* 131* 121* 119*  BUN 26* 33* 45* 35* 25* 24* 23*  CREATININE 1.07 1.16 1.41* 1.19 1.08 1.13 1.04  CALCIUM 7.8* 7.8* 8.0* 7.9* 8.4* 8.3* 8.1*  MG 2.0  2.0 2.1 2.1 2.2 2.1  --   PHOS 5.1* 4.7* 5.5* 4.6 4.1  --   --    GFR: Estimated Creatinine Clearance: 75.7 mL/min (by C-G formula based on SCr of 1.04 mg/dL). Liver Function Tests: Recent Labs  Lab 07/01/23 0413 07/02/23 0448 07/03/23 0407  AST 51* 48* 46*  ALT 48* 49* 47*  ALKPHOS 108 117 110  BILITOT 0.6 0.5 0.2  PROT 8.3* 8.6* 8.3*  ALBUMIN 1.9* 1.9* 1.9*   CBG: Recent Labs  Lab 07/03/23 0719 07/03/23 1154 07/03/23 1656 07/03/23 2026 07/04/23 0716  GLUCAP 127* 135* 120* 88 130*   Recent Results (from the past 240 hours)  Culture, Respiratory w Gram Stain     Status: None   Collection Time: 06/29/23  1:25 PM   Specimen: Tracheal Aspirate; Respiratory  Result Value Ref Range Status   Specimen Description   Final    TRACHEAL ASPIRATE Performed at [facility], [address] [place], [place] [address]    Special Requests   Final    NONE Performed at [facility], [address], [place], [place] [address]    Gram Stain   Final    NO WBC SEEN RARE GRAM  POSITIVE COCCI RARE GRAM NEGATIVE RODS    Culture   Final    ABUNDANT METHICILLIN RESISTANT STAPHYLOCOCCUS AUREUS FEW PSEUDOMONAS AERUGINOSA HEALTH DEPARTMENT NOTIFIED Performed at [facility], [address], [place], [place] [address]    Report Status 07/01/2023 FINAL  Final   Organism ID, Bacteria METHICILLIN RESISTANT STAPHYLOCOCCUS AUREUS  Final   Organism ID, Bacteria PSEUDOMONAS AERUGINOSA  Final      Susceptibility   Methicillin resistant staphylococcus aureus - MIC*    CIPROFLOXACIN >=8 RESISTANT Resistant     ERYTHROMYCIN >=8 RESISTANT Resistant     GENTAMICIN <=0.5 SENSITIVE Sensitive     OXACILLIN >=4 RESISTANT Resistant     TETRACYCLINE <=1 SENSITIVE Sensitive     VANCOMYCIN 1 SENSITIVE Sensitive     TRIMETH/SULFA >=320 RESISTANT Resistant     CLINDAMYCIN <=0.25 SENSITIVE Sensitive     RIFAMPIN <=0.5 SENSITIVE Sensitive     Inducible Clindamycin NEGATIVE Sensitive     LINEZOLID 2 SENSITIVE Sensitive     * ABUNDANT METHICILLIN RESISTANT STAPHYLOCOCCUS AUREUS   Pseudomonas aeruginosa - MIC*    CEFTAZIDIME 2 SENSITIVE Sensitive     CIPROFLOXACIN 2 RESISTANT Resistant     GENTAMICIN <=1 SENSITIVE Sensitive     IMIPENEM >=16 RESISTANT Resistant     PIP/TAZO <=4 SENSITIVE Sensitive ug/mL    CEFEPIME 2 SENSITIVE Sensitive     * FEW PSEUDOMONAS AERUGINOSA  Culture, blood (Routine X 2) w Reflex to ID Panel     Status: None (Preliminary result)   Collection Time: 06/30/23 10:22 AM   Specimen: BLOOD  Result Value Ref Range Status   Specimen Description   Final    BLOOD BLOOD LEFT HAND AEROBIC BOTTLE ONLY Performed at [facility], [address], [place], [place] [address]    Special Requests   Final    BOTTLES DRAWN AEROBIC ONLY Blood Culture results may not be optimal due to an inadequate volume of blood received in culture bottles Performed at [facility], [address] [place], [place] [address]    Culture   Final     NO GROWTH 4 DAYS Performed at [facility], [address] [place], [place] [address]    Report Status PENDING  Incomplete  Culture, blood (Routine X 2) w Reflex to ID Panel     Status: None (Preliminary result)   Collection Time: 06/30/23 10:28 AM   Specimen: BLOOD  Result Value Ref Range Status   Specimen Description   Final    BLOOD BLOOD LEFT HAND AEROBIC BOTTLE ONLY Performed at [facility], [address], [place], [place] [address]    Special Requests   Final    BOTTLES DRAWN AEROBIC ONLY Blood Culture results may not be optimal due to an inadequate volume of blood received in culture bottles Performed at [facility], [address] [place], [place] [address]    Culture   Final    NO GROWTH 4 DAYS Performed at [facility], [address] [place], [place] [address]    Report Status PENDING  Incomplete     Radiology Studies: [place] EKG SITE RITE Result Date: 07/03/2023 If Site Rite image not attached, placement could not be confirmed due to current cardiac rhythm.  Scheduled Meds:  carvedilol  6.25 mg Oral BID WC   Chlorhexidine Gluconate Cloth  6 each Topical Daily   chlorpheniramine-HYDROcodone  5 mL Oral Q12H   clonazePAM  1 mg Oral BID   enoxaparin (LOVENOX) injection  60 mg Subcutaneous Q12H   feeding supplement  237 mL Oral TID BM   guaiFENesin  20 mL Oral BID   insulin aspart  0-5 Units Subcutaneous QHS   insulin aspart  0-9 Units Subcutaneous TID WC   lidocaine  1 patch Transdermal Q24H   multivitamin with minerals  1 tablet Oral Daily   oxyCODONE  10 mg Oral QID   pantoprazole  40 mg Oral Daily   polyethylene glycol  17 g Oral Daily   potassium chloride  40 mEq Oral Q4H   senna-docusate  2 tablet Oral BID   sodium chloride flush  10-40 mL Intracatheter Q12H   sodium chloride flush  3-10 mL Intravenous Q12H   thiamine  100 mg Oral Daily   Continuous Infusions:  piperacillin-tazobactam (ZOSYN)  IV 3.375 g  (07/04/23 0516)   vancomycin (VANCOCIN) 500 mg in sodium chloride 0.9 % 100 mL IVPB 500 mg (07/03/23 2100)     LOS: 21 days   Time spent: 40 minutes  Carollee Herter, DO  [facility]  07/04/2023, 10:43 AM

## 2023-07-04 NOTE — Progress Notes (Signed)
 Occupational Therapy Treatment Patient Details Name: Brendan Preston MRN: [id number] DOB: 1976-01-11 Today's Date: 07/04/2023   History of present illness 49 yoM admitted 06/13/23 with AMS, fever, tachycardic, normo to slightly hypertensive . Patient intubated 3/13-3/24.PMH: polysubstance abuse in which EMS   OT comments  Pt making progress with functional goals. Pt requires CGA to sit EOB with CGA to balance/steady himself. Pt sat EOB for grooming/hygiene, simulated UB bathing and forward reaching activity with B UEs. Pt sat EOB ~12 minutes. Pt returned to supine CGA, no physical assist. OT will continue to follow acutely to maximize level of function and safety       If plan is discharge home, recommend the following:  A lot of help with walking and/or transfers;Two people to help with walking and/or transfers;Assistance with cooking/housework;Assist for transportation;Help with stairs or ramp for entrance   Equipment Recommendations  Other (comment) (TBD at next venue of care)    Recommendations for Other Services      Precautions / Restrictions Precautions Precautions: Fall Recall of Precautions/Restrictions: Intact Restrictions Weight Bearing Restrictions Per Provider Order: No       Mobility Bed Mobility Overal bed mobility: Needs Assistance Bed Mobility: Supine to Sit, Sit to Supine     Supine to sit: Contact guard Sit to supine: Contact guard assist   General bed mobility comments: increased time and effort to sit EOB and return to supine    Transfers                         Balance Overall balance assessment: Needs assistance Sitting-balance support: Feet supported, Bilateral upper extremity supported Sitting balance-Leahy Scale: Fair Sitting balance - Comments: pt able to sit EOB with CGA for balance/saefty, some posterior leaning but pt able to self correct posture                                   ADL either performed or assessed with  clinical judgement   ADL Overall ADL's : Needs assistance/impaired     Grooming: Wash/dry hands;Wash/dry face;Contact guard assist;Sitting   Upper Body Bathing: Moderate assistance;Sitting Upper Body Bathing Details (indicate cue type and reason): simulated       Upper Body Dressing Details (indicate cue type and reason): pt declined donning clean gown                   General ADL Comments: Pt sat EOB for grooming/hygiene, simulated UB bathing and forward reaching activity with B UEs. Pt sat EOB ~12 minutes    Extremity/Trunk Assessment Upper Extremity Assessment Upper Extremity Assessment: Generalized weakness   Lower Extremity Assessment Lower Extremity Assessment: Defer to PT evaluation   Cervical / Trunk Assessment Cervical / Trunk Assessment: Normal    Vision Ability to See in Adequate Light: 0 Adequate Patient Visual Report: No change from baseline     Perception     Praxis     Communication Communication Factors Affecting Communication: Reduced clarity of speech   Cognition Arousal: Alert Behavior During Therapy: WFL for tasks assessed/performed Cognition: No apparent impairments                               Following commands: Intact        Cueing      Exercises      Shoulder Instructions  General Comments      Pertinent Vitals/ Pain       Pain Assessment Pain Assessment: Faces Faces Pain Scale: Hurts little more Pain Location: back Pain Descriptors / Indicators: Aching, Sore Pain Intervention(s): Premedicated before session, Monitored during session, Repositioned  Home Living                                          Prior Functioning/Environment              Frequency           Progress Toward Goals  OT Goals(current goals can now be found in the care plan section)  Progress towards OT goals: Progressing toward goals     Plan      Co-evaluation                  AM-PAC OT "6 Clicks" Daily Activity     Outcome Measure   Help from another person eating meals?: None Help from another person taking care of personal grooming?: A Little Help from another person toileting, which includes using toliet, bedpan, or urinal?: Total Help from another person bathing (including washing, rinsing, drying)?: A Lot Help from another person to put on and taking off regular upper body clothing?: A Lot Help from another person to put on and taking off regular lower body clothing?: Total 6 Click Score: 13    End of Session    OT Visit Diagnosis: Other abnormalities of gait and mobility (R26.89);Muscle weakness (generalized) (M62.81);Pain Pain - part of body:  (back)   Activity Tolerance Patient limited by fatigue   Patient Left in bed;with call bell/phone within reach;with bed alarm set   Nurse Communication          Time: 1126-1150 OT Time Calculation (min): 24 min  Charges: OT General Charges $OT Visit: 1 Visit OT Treatments $Therapeutic Activity: 8-22 mins   Galen Manila 07/04/2023, 1:14 PM

## 2023-07-04 NOTE — Progress Notes (Signed)
 Nutrition Follow-up  DOCUMENTATION CODES:   Non-severe (moderate) malnutrition in context of chronic illness  INTERVENTION:  - Regular diet.  - Ensure Plus High Protein po TID, each supplement provides 350 kcal and 20 grams of protein. - Encourage intake at all meals and of supplements. - Multivitamin with minerals daily - Monitor weight trends.   NUTRITION DIAGNOSIS:   Moderate Malnutrition related to chronic illness (polysubstance abuse) as evidenced by mild fat depletion, mild muscle depletion. *ongoing  GOAL:   Patient will meet greater than or equal to 90% of their needs *progressing  MONITOR:   Vent status, Labs, Weight trends, TF tolerance  REASON FOR ASSESSMENT:   Consult Assessment of nutrition requirement/status, Wound healing, Poor PO  ASSESSMENT:   48 yo male with prior hx of known polysubstance (heroin/ fentanyl) abuse admitted for AMS.  3/7 Admit 3/11 Intubated; OGT placed 3/12 Vital 1.5 started @ 20 3/24 Extubated; Regular diet  Patient working with another provider at time of visit.  Per chart review, he has been consuming 20-80% of meals. Drinking Ensure 3 times daily (which alone provides 1050 kcals and 60g protein).  Will continue Ensure TID as well as MVI. Patient currently pending SNF placement.    Admit weight: 147# Current weight: 137#  I&O's: -7.2L since 3/14   Medications reviewed and include: Miralax, Senokot, MVI, 100mg  thiamine   Labs reviewed:  Na 133 K+ 3.3   Diet Order:   Diet Order             Diet regular Room service appropriate? Yes; Fluid consistency: Thin  Diet effective now                   EDUCATION NEEDS:  Not appropriate for education at this time  Skin:  Skin Assessment: Reviewed RN Assessment  Last BM:  3/27 - type 7  Height:  Ht Readings from Last 1 Encounters:  06/28/23 5\' 11"  (1.803 m)   Weight: Wt Readings from Last 1 Encounters:  07/03/23 62.3 kg   BMI:  Body mass index is 19.16  kg/m.  Estimated Nutritional Needs:  Kcal:  2000-2350 kcals Protein:  100-120 grams Fluid:  >/= 2L    Shelle Iron RD, LDN Contact via Secure Chat.

## 2023-07-04 NOTE — TOC Progression Note (Signed)
 Transition of Care [facility]) - Progression Note    Patient Details  Name: Brendan Preston MRN: [id number] Date of Birth: 04-13-1975  [facility]) CM/SW Contact  Otelia Santee, LCSW Phone Number: 07/04/2023, 12:59 PM  Clinical Narrative:    Currently no SNF bed offers. Have sent message to [facility] to review for placement.      Barriers to Discharge: Continued Medical Work up  Expected Discharge Plan and Services     Post Acute Care Choice: NA Living arrangements for the past 2 months: Single Family Home                   DME Agency: NA                   Social Determinants of Health (SDOH) Interventions SDOH Screenings   Food Insecurity: Patient Declined (06/15/2023)  Housing: Patient Unable To Answer (06/29/2023)  Transportation Needs: Patient Unable To Answer (06/16/2023)  Utilities: Patient Unable To Answer (06/16/2023)  Tobacco Use: High Risk (06/29/2023)    Readmission Risk Interventions    06/14/2023    8:46 AM  Readmission Risk Prevention Plan  Post Dischage Appt Complete  Medication Screening Complete  Transportation Screening Complete

## 2023-07-05 ENCOUNTER — Inpatient Hospital Stay (HOSPITAL_COMMUNITY): Payer: MEDICAID

## 2023-07-05 LAB — CULTURE, BLOOD (ROUTINE X 2)
Culture: NO GROWTH
Culture: NO GROWTH

## 2023-07-05 NOTE — Plan of Care (Signed)
  Problem: Clinical Measurements: Goal: Will remain free from infection Outcome: Progressing Goal: Respiratory complications will improve Outcome: Progressing Goal: Cardiovascular complication will be avoided Outcome: Progressing   Problem: Coping: Goal: Level of anxiety will decrease Outcome: Progressing   Problem: Pain Managment: Goal: General experience of comfort will improve and/or be controlled Outcome: Progressing   Problem: Safety: Goal: Ability to remain free from injury will improve Outcome: Progressing   Problem: Skin Integrity: Goal: Risk for impaired skin integrity will decrease Outcome: Progressing   Problem: Fluid Volume: Goal: Ability to maintain a balanced intake and output will improve Outcome: Progressing   Problem: Tissue Perfusion: Goal: Adequacy of tissue perfusion will improve Outcome: Progressing

## 2023-07-05 NOTE — Progress Notes (Signed)
 PROGRESS NOTE    Brendan Preston  ZOX:[id number] DOB: 1975/10/18 DOA: 06/13/2023 PCP: Patient, No Pcp Per  Subjective: Pt seen and examined. PICC changed to left UE. Single lumen. Appears to be improving according to PT note from yesterday.  No further use for telemetry. Tachycardia has resolved. Will DC telemetry.  Pt may shower.   Hospital Course: HPI: Pt encephalopathic, therefore HPI obtained from chart review.  See additional chart w/MRN [id number]   53 yoM with prior hx of known polysubstance (heroin/ fentanyl) abuse in which EMS was called out for AMS, last seen awake 3 days ago covered in urine and feces.    In ER, temp 104.3 rectal, tachycardic, normo to slightly hypertensive, sats 100% on NRB> but maintaining sats on RA with GCS ~10 but protecting airway.  Labs significant for lactic 8.4, WBC 15, plts 96, VBG 7.29/ 44,  CXR neg, RVP neg, EKG without ST changes, qtc borderline, CTH official read pending, trop hs 694, INR 1.3.  Foley placed- urine cloudy, glucose 150, Hgb 150 (-RBC), neg leuks/nitrates, WBC 6-10, UDS positive for THC and opiates.  CMET and other labs had to be redrawn and still awaiting due to hemolysis.  Cultures sent, empiric vanc/ cefepime given, 2L LR.  PCCM called for admit.  Significant Events: Admitted 06/13/2023 for severe sepsis 3/7 admit, encephalopathic, altered, drug rash developed after cefepime. BLOOD UCLURE MRSA 3/8 TTE negative for endocarditis 3/10 he had a TEE scheduled but sounds like he has declined this . Larey Seat out of bed late day shift and broke his nose. Bcx still pos. 3/11 Tubed in afternoon. CRP high.  3/12 changing his pain reg/ PAD 3/13 on vent 3/14 CT abdomen showed left pneumothorax, bilateral lower lobe consolidation, multiple cavitary lesions, suspicious for bilateral common iliac DVT, fluid collection along L4/L5 vertebral bodies. 3/14 left pigtail chest tube for pneumothorax 3/15 started on IV heparin, venous duplex negative, MRI  lumbar spine suggests L5 osteo/no epidural abscess 3/17 changing IV heparin to LMWH as heparin levels not detectable. Still having marked trach secretions. Growing PA so added meropenem (given his rash w/ cefepime). Changed fent to dilaudid at 2mg /hr. CT clamped.  3/18 hgb down to 6.6 got 1 unit PRBC. No obvious bleeding source. Was >13 liters positive so also starting diuretics. Dec'd dilaudid dose to 1.5mg /hr. no PTX so CT removed.  3/19 tolerated 7 hours PSV 10/5 but had quite a lot of secretions requiring frequent suctioning CHES TTUBE REMOVED 3/21 - Temp 99.5. On 30% FiO2. Diuresed well overnight, 5L past 24 hours but still +8L net since admit. 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 On and off low grade fever 101F. On vent 30% fio2/ Per RN and patient - c/p severe cough an donly lidocaine neb helps. C/oc copious resp secretions. PER RN - due to copious secretions patient heading for trach 3/24 extubated 07-02-2023 care transferred to [facility] 07-03-2023 Pt turned down by CIR.  Antibiotic Therapy: Anti-infectives (From admission, onward)    Start     Dose/Rate Route Frequency Ordered Stop   07/01/23 1515  piperacillin-tazobactam (ZOSYN) IVPB 3.375 g        3.375 g 12.5 mL/hr over 240 Minutes Intravenous Every 8 hours 07/01/23 1415     06/30/23 1400  ciprofloxacin (CIPRO) IVPB 400 mg  Status:  Discontinued        400 mg 200 mL/hr over 60 Minutes Intravenous Every 8 hours 06/30/23 1224 07/01/23 1415   06/27/23 2200  vancomycin (VANCOREADY) IVPB 500 mg/100 mL  Status:  Discontinued        500 mg 100 mL/hr over 60 Minutes Intravenous Every 12 hours 06/27/23 1140 06/27/23 1619   06/27/23 2200  vancomycin (VANCOREADY) IVPB  500 mg/100 mL  Status:  Discontinued        500 mg 100 mL/hr over 60 Minutes Intravenous Every 12 hours 06/27/23 1619 06/27/23 1619   06/27/23 2200  vancomycin (VANCOCIN) 500 mg in sodium chloride 0.9 % 100 mL IVPB        500 mg 100 mL/hr over 60 Minutes Intravenous Every 12 hours 06/27/23 1623     06/25/23 1400  ciprofloxacin (CIPRO) IVPB 400 mg        400 mg 200 mL/hr over 60 Minutes Intravenous Every 8 hours 06/25/23 0748 06/29/23 2329   06/23/23 2200  meropenem (MERREM) 1 g in sodium chloride 0.9 % 100 mL IVPB  Status:  Discontinued        1 g 200 mL/hr over 30 Minutes Intravenous Every 8 hours 06/23/23 1108 06/25/23 0748   06/23/23 1130  meropenem (MERREM) 1 g in sodium chloride 0.9 % 100 mL IVPB        1 g 200 mL/hr over 30 Minutes Intravenous  Once 06/23/23 1057 06/23/23 1140   06/17/23 1000  vancomycin (VANCOREADY) IVPB 1250 mg/250 mL  Status:  Discontinued        1,250 mg 166.7 mL/hr over 90 Minutes Intravenous Every 24 hours 06/17/23 0709 06/17/23 0818   06/17/23 0900  vancomycin (VANCOREADY) IVPB 750 mg/150 mL  Status:  Discontinued        750 mg 150 mL/hr over 60 Minutes Intravenous Every 12 hours 06/17/23 0818 06/27/23 1140   06/16/23 1030  vancomycin (VANCOCIN) IVPB 1000 mg/200 mL premix  Status:  Discontinued        1,000 mg 200 mL/hr over 60 Minutes Intravenous Every 24 hours 06/16/23 1022 06/17/23 0709   06/15/23 1345  cefTRIAXone (ROCEPHIN) 2 g in sodium chloride 0.9 % 100 mL IVPB  Status:  Discontinued        2 g 200 mL/hr over 30 Minutes Intravenous Every 24 hours 06/15/23 1249 06/15/23 2226   06/14/23 1000  vancomycin (VANCOREADY) IVPB 750 mg/150 mL  Status:  Discontinued        750 mg 150 mL/hr over 60 Minutes Intravenous Every 24 hours 06/14/23 0852 06/16/23 1022   06/13/23 1940  cefTRIAXone (ROCEPHIN) 2 g in sodium chloride 0.9 % 100 mL IVPB  Status:  Discontinued        2 g 200 mL/hr over 30 Minutes Intravenous 2 times daily 06/13/23 1940 06/14/23 0802    06/13/23 1830  cefTRIAXone (ROCEPHIN) 2 g in sodium chloride 0.9 % 100 mL IVPB  Status:  Discontinued        2 g 200 mL/hr over 30 Minutes Intravenous Every 24 hours 06/13/23 1803 06/13/23 1940   06/13/23 1445  vancomycin (VANCOCIN) 500 mg in sodium chloride 0.9 % 100 mL IVPB        500 mg 100 mL/hr over 60 Minutes Intravenous  Once 06/13/23 1430 06/13/23 1627   06/13/23 1430  vancomycin (VANCOREADY) IVPB 500 mg/100 mL  Status:  Discontinued        500 mg 100  mL/hr over 60 Minutes Intravenous  Once 06/13/23 1426 06/13/23 1430   06/13/23 1400  vancomycin (VANCOCIN) IVPB 1000 mg/200 mL premix        1,000 mg 200 mL/hr over 60 Minutes Intravenous  Once 06/13/23 1354 06/13/23 1527   06/13/23 1345  ceFEPIme (MAXIPIME) 2 g in sodium chloride 0.9 % 100 mL IVPB        2 g 200 mL/hr over 30 Minutes Intravenous  Once 06/13/23 1335 06/13/23 1449       Procedures: 07-04-2023 RUE PICC removed. Single lumen Left UE PICC placed  Consultants: PCCM Infectious Disease    Assessment and Plan: * MRSA bacteremia 07-02-2023 [facility] assumed care on 07-01-2024. Pt has spent the last 18 prior days on PCCM service. Transesophageal Echo negative for endocarditis. ID states pt will need 8 weeks of IV Vanco. Start date is 06-15-2023.  So end date of therapy is Aug 09, 2023  07-03-2023  continue with IV Zosyn thru 07-07-2023 and IV Vanco through Aug 09, 2023. End date of IV vanco is 08-09-2023.  CM looking into SNF for IV ABX. Pt turned down by CIR. Started on low dose coreg to help with his tachycardia. Continue to monitor.  07-04-2023  continue with IV Zosyn thru 07-07-2023 and IV Vanco through Aug 09, 2023. End date of IV vanco is 08-09-2023.  CM looking into SNF for IV ABX. Pt turned down by CIR.  (Per ID, If pt goes to snf or stay inpatient, then plan 4 weeks iv abx until 07/13/2023 then finish the rest of the 8 weeks with 2 weeks linezolid until 08/06/23, and then 2 weeks doxycycline).   07-05-2023 remains on IV Zosyn and  IV vanco. CM looking into SNF for IV abx and rehab.  Sinus tachycardia 07-04-2023 likely due to severe deconditioning. Improved with coreg 6.25 mg bid.  07-05-2023 resolved with coreg 6.25 mg bid  Critical illness myopathy 07-03-2023 pt turned down by inpatient rehab. Pt is uninsured. CM will attempt SNF placement with potential hospital guarantee. Continue with PT/OT.  07-04-2023 CM still looking for SNF bed.  07-05-2023 CM still looking for SNF bed. CIR declined to accept pt.  IVDU (intravenous drug user) 07-02-2023 pt states has used IV drugs in the past. 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 Pt states he was on suboxone in the past. He wants to try and get back on suboxone.  Discussed that his oxycodone dose would not be increased while he is in the hospital. And that he will not be given anymore IV opiates. Discussed with him that it would be best for him to get on suboxone therapy as soon as possible but he would need to stop all opiate therapy for 24-48 hours prior to starting suboxone. He states he will "think about it".  07-03-2023 pt unwilling to change to suboxone. Do NOT increase or give extra doses of oxycodone. Do NOT give any IV opiates/benzos. Start oxycodone taper next week.  07-05-2023 remains on oxycodone QID 10 mg. Pt not willing to try suboxone while in the hospital.  Septic discitis of lumbar region 07-02-2023 [facility] assumed care on 07-01-2024. Pt has spent the last 18 prior days on PCCM service. Transesophageal Echo negative for endocarditis. ID states pt will need 8 weeks of IV Vanco. Start date is 06-15-2023.  So end date of therapy is Aug 09, 2023   07-03-2023  continue with IV Zosyn thru 07-07-2023 and IV Vanco  through Aug 09, 2023. End date of IV vanco is 08-09-2023.  CM looking into SNF for IV ABX. Pt turned down by CIR.  07-04-2023  continue with IV Zosyn thru 07-07-2023 and IV Vanco through Aug 09, 2023. End date of  IV vanco is 08-09-2023.  CM looking into SNF for IV ABX. Pt turned down by CIR.  (Per ID, If pt goes to snf or stay inpatient, then plan 4 weeks iv abx until 07/13/2023 then finish the rest of the 8 weeks with 2 weeks linezolid until 08/06/23, and then 2 weeks doxycycline).   07-05-2023 remains on IV Zosyn and IV vanco. CM looking into SNF for IV abx and rehab.  Acute respiratory failure with hypoxia (HCC) 07-02-2023 pt extubated on 06-30-2023. Currently on 2 L/min. Wean to RA if possible.  07-03-2023 continue to wean O2  07-04-2023 wean O2 if possible.  07-05-2023 attempt to wean. Will repeat CXR today  Malnutrition of moderate degree Nutrition Status: Nutrition Problem: Moderate Malnutrition Etiology: chronic illness (polysubstance abuse) Signs/Symptoms: mild fat depletion, mild muscle depletion Interventions: Refer to RD note for recommendations, Tube feeding    Septic embolism (HCC) Present on admission.  Severe sepsis (HCC) Present on admission.  DVT prophylaxis: SCDs Start: 06/13/23 1755    Code Status: Full Code Family Communication: no family at bedside Disposition Plan: SNF vs home with [facility] Reason for continuing need for hospitalization: remains on IV ABX, supplemental O2.  Objective: Vitals:   07/04/23 1809 07/04/23 2203 07/05/23 0443 07/05/23 0500  BP: 139/81 115/71 (!) 142/70   Pulse: (!) 108 96 94   Resp:  16 18   Temp:  99.4 F (37.4 C) 98.7 F (37.1 C)   TempSrc:   Oral   SpO2:  97% 97%   Weight:    63.1 kg  Height:        Intake/Output Summary (Last 24 hours) at 07/05/2023 1009 Last data filed at 07/05/2023 0844 Gross per 24 hour  Intake 172.18 ml  Output 2040 ml  Net -1867.82 ml   Filed Weights   07/02/23 0446 07/03/23 0516 07/05/23 0500  Weight: 61.4 kg 62.3 kg 63.1 kg    Examination:  Physical Exam Vitals and nursing note reviewed.  Constitutional:      Comments: Chronically ill appearing  HENT:     Head: Normocephalic and atraumatic.   Cardiovascular:     Rate and Rhythm: Normal rate and regular rhythm.  Pulmonary:     Effort: Pulmonary effort is normal.     Breath sounds: Normal breath sounds.  Abdominal:     General: Bowel sounds are normal.     Palpations: Abdomen is soft.  Musculoskeletal:     Right lower leg: No edema.     Left lower leg: No edema.     Comments: Left UE PICC  Skin:    Capillary Refill: Capillary refill takes less than 2 seconds.  Neurological:     Mental Status: He is oriented to person, place, and time.     Data Reviewed: I have personally reviewed following labs and imaging studies  CBC: Recent Labs  Lab 06/30/23 0354 07/01/23 0413 07/02/23 0448 07/03/23 0407 07/04/23 0006  WBC 10.7* 13.9* 15.4* 15.2* 13.2*  NEUTROABS  --  10.0* 11.8* 10.8* 8.9*  HGB 7.9* 8.3* 8.6* 8.5* 8.0*  HCT 25.8* 26.4* 27.8* 28.0* 25.7*  MCV 89.9 88.0 88.0 89.7 87.7  PLT 345 388 427* 478* 501*   Basic Metabolic Panel: Recent Labs  Lab 06/29/23  1610 06/30/23 0354 07/01/23 0413 07/02/23 0448 07/03/23 0407 07/04/23 0006  NA 138 139 137 137 135 133*  K 3.9 3.6 3.2* 3.7 3.5 3.3*  CL 99 101 99 101 98 97*  CO2 29 29 29 27 29 29   GLUCOSE 123* 166* 114* 131* 121* 119*  BUN 33* 45* 35* 25* 24* 23*  CREATININE 1.16 1.41* 1.19 1.08 1.13 1.04  CALCIUM 7.8* 8.0* 7.9* 8.4* 8.3* 8.1*  MG 2.0 2.1 2.1 2.2 2.1  --   PHOS 4.7* 5.5* 4.6 4.1  --   --    GFR: Estimated Creatinine Clearance: 76.7 mL/min (by C-G formula based on SCr of 1.04 mg/dL). Liver Function Tests: Recent Labs  Lab 07/01/23 0413 07/02/23 0448 07/03/23 0407  AST 51* 48* 46*  ALT 48* 49* 47*  ALKPHOS 108 117 110  BILITOT 0.6 0.5 0.2  PROT 8.3* 8.6* 8.3*  ALBUMIN 1.9* 1.9* 1.9*   CBG: Recent Labs  Lab 07/03/23 0719 07/03/23 1154 07/03/23 1656 07/03/23 2026 07/04/23 0716  GLUCAP 127* 135* 120* 88 130*    Recent Results (from the past 240 hours)  Culture, Respiratory w Gram Stain     Status: None   Collection Time: 06/29/23   1:25 PM   Specimen: Tracheal Aspirate; Respiratory  Result Value Ref Range Status   Specimen Description   Final    TRACHEAL ASPIRATE Performed at [facility], [address] [place], [place] [address]    Special Requests   Final    NONE Performed at [facility], [address], [place], [place] [address]    Gram Stain   Final    NO WBC SEEN RARE GRAM POSITIVE COCCI RARE GRAM NEGATIVE RODS    Culture   Final    ABUNDANT METHICILLIN RESISTANT STAPHYLOCOCCUS AUREUS FEW PSEUDOMONAS AERUGINOSA HEALTH DEPARTMENT NOTIFIED Performed at [facility], [address], [place], [place] [address]    Report Status 07/01/2023 FINAL  Final   Organism ID, Bacteria METHICILLIN RESISTANT STAPHYLOCOCCUS AUREUS  Final   Organism ID, Bacteria PSEUDOMONAS AERUGINOSA  Final      Susceptibility   Methicillin resistant staphylococcus aureus - MIC*    CIPROFLOXACIN >=8 RESISTANT Resistant     ERYTHROMYCIN >=8 RESISTANT Resistant     GENTAMICIN <=0.5 SENSITIVE Sensitive     OXACILLIN >=4 RESISTANT Resistant     TETRACYCLINE <=1 SENSITIVE Sensitive     VANCOMYCIN 1 SENSITIVE Sensitive     TRIMETH/SULFA >=320 RESISTANT Resistant     CLINDAMYCIN <=0.25 SENSITIVE Sensitive     RIFAMPIN <=0.5 SENSITIVE Sensitive     Inducible Clindamycin NEGATIVE Sensitive     LINEZOLID 2 SENSITIVE Sensitive     * ABUNDANT METHICILLIN RESISTANT STAPHYLOCOCCUS AUREUS   Pseudomonas aeruginosa - MIC*    CEFTAZIDIME 2 SENSITIVE Sensitive     CIPROFLOXACIN 2 RESISTANT Resistant     GENTAMICIN <=1 SENSITIVE Sensitive     IMIPENEM >=16 RESISTANT Resistant     PIP/TAZO <=4 SENSITIVE Sensitive ug/mL    CEFEPIME 2 SENSITIVE Sensitive     * FEW PSEUDOMONAS AERUGINOSA  Culture, blood (Routine X 2) w Reflex to ID Panel     Status: None   Collection Time: 06/30/23 10:22 AM   Specimen: BLOOD  Result Value Ref Range Status   Specimen Description   Final    BLOOD BLOOD LEFT HAND  AEROBIC BOTTLE ONLY Performed at [facility], [address], [place], [place] [address]    Special  Requests   Final    BOTTLES DRAWN AEROBIC ONLY Blood Culture results may not be optimal due to an inadequate volume of blood received in culture bottles Performed at [facility], [address] [place], [place] [address]    Culture   Final    NO GROWTH 5 DAYS Performed at [facility], [address] [place], [place] [address]    Report Status 07/05/2023 FINAL  Final  Culture, blood (Routine X 2) w Reflex to ID Panel     Status: None   Collection Time: 06/30/23 10:28 AM   Specimen: BLOOD  Result Value Ref Range Status   Specimen Description   Final    BLOOD BLOOD LEFT HAND AEROBIC BOTTLE ONLY Performed at [facility], [address], [place], [place] [address]    Special Requests   Final    BOTTLES DRAWN AEROBIC ONLY Blood Culture results may not be optimal due to an inadequate volume of blood received in culture bottles Performed at [facility], [address], [place], [place] [address]    Culture   Final    NO GROWTH 5 DAYS Performed at [facility], [address] [place], [place] [address]    Report Status 07/05/2023 FINAL  Final     Radiology Studies: [place] EKG SITE RITE Result Date: 07/03/2023 If Site Rite image not attached, placement could not be confirmed due to current cardiac rhythm.   Scheduled Meds:  carvedilol  6.25 mg Oral BID WC   Chlorhexidine Gluconate Cloth  6 each Topical Daily   chlorpheniramine-HYDROcodone  5 mL Oral Q12H   clonazePAM  1 mg Oral BID   enoxaparin (LOVENOX) injection  60 mg Subcutaneous Q12H   feeding supplement  237 mL Oral TID BM   guaiFENesin  20 mL Oral BID   lidocaine  1 patch Transdermal Q24H   multivitamin with minerals  1 tablet Oral Daily   oxyCODONE  10 mg Oral QID   pantoprazole  40 mg Oral Daily   polyethylene glycol  17 g Oral  Daily   senna-docusate  2 tablet Oral BID   sodium chloride flush  10-40 mL Intracatheter Q12H   sodium chloride flush  3-10 mL Intravenous Q12H   thiamine  100 mg Oral Daily   Continuous Infusions:  piperacillin-tazobactam (ZOSYN)  IV 3.375 g (07/05/23 0528)   vancomycin 1,250 mg (07/05/23 0912)     LOS: 22 days   Time spent: 45 minutes  Carollee Herter, DO  [facility]  07/05/2023, 10:09 AM

## 2023-07-05 NOTE — Progress Notes (Signed)
   Reviewed CXR from today. Doesn't look any worse than before. Will start chest physiotherapy. Maybe the vibration vest will help him cough up more mucus.  Carollee Herter, DO [facility]

## 2023-07-06 NOTE — Plan of Care (Signed)
  Problem: Clinical Measurements: Goal: Diagnostic test results will improve Outcome: Progressing Goal: Respiratory complications will improve Outcome: Progressing Goal: Cardiovascular complication will be avoided Outcome: Progressing   Problem: Pain Managment: Goal: General experience of comfort will improve and/or be controlled Outcome: Progressing

## 2023-07-06 NOTE — Progress Notes (Signed)
 PROGRESS NOTE    Brendan Preston  EAV:[id number] DOB: November 30, 1975 DOA: 06/13/2023 PCP: Patient, No Pcp Per  Subjective: Pt seen and examined. Doing well. CXR shows remnants of his septic emboli. Remains on supplemental O2. Ordered chest physiotherapy. Pt has a weak cough. Hopefully chest physiotherapy with help him cough up more.  Pt states he was strong enough to sit on bed side commode to have BM yesterday. Hope to be able to take a shower today.   Hospital Course: HPI: Pt encephalopathic, therefore HPI obtained from chart review.  See additional chart w/MRN [id number]   14 yoM with prior hx of known polysubstance (heroin/ fentanyl) abuse in which EMS was called out for AMS, last seen awake 3 days ago covered in urine and feces.    In ER, temp 104.3 rectal, tachycardic, normo to slightly hypertensive, sats 100% on NRB> but maintaining sats on RA with GCS ~10 but protecting airway.  Labs significant for lactic 8.4, WBC 15, plts 96, VBG 7.29/ 44,  CXR neg, RVP neg, EKG without ST changes, qtc borderline, CTH official read pending, trop hs 694, INR 1.3.  Foley placed- urine cloudy, glucose 150, Hgb 150 (-RBC), neg leuks/nitrates, WBC 6-10, UDS positive for THC and opiates.  CMET and other labs had to be redrawn and still awaiting due to hemolysis.  Cultures sent, empiric vanc/ cefepime given, 2L LR.  PCCM called for admit.  Significant Events: Admitted 06/13/2023 for severe sepsis 3/7 admit, encephalopathic, altered, drug rash developed after cefepime. BLOOD UCLURE MRSA 3/8 TTE negative for endocarditis 3/10 he had a TEE scheduled but sounds like he has declined this . Larey Seat out of bed late day shift and broke his nose. Bcx still pos. 3/11 Tubed in afternoon. CRP high.  3/12 changing his pain reg/ PAD 3/13 on vent 3/14 CT abdomen showed left pneumothorax, bilateral lower lobe consolidation, multiple cavitary lesions, suspicious for bilateral common iliac DVT, fluid collection along L4/L5  vertebral bodies. 3/14 left pigtail chest tube for pneumothorax 3/15 started on IV heparin, venous duplex negative, MRI lumbar spine suggests L5 osteo/no epidural abscess 3/17 changing IV heparin to LMWH as heparin levels not detectable. Still having marked trach secretions. Growing PA so added meropenem (given his rash w/ cefepime). Changed fent to dilaudid at 2mg /hr. CT clamped.  3/18 hgb down to 6.6 got 1 unit PRBC. No obvious bleeding source. Was >13 liters positive so also starting diuretics. Dec'd dilaudid dose to 1.5mg /hr. no PTX so CT removed.  3/19 tolerated 7 hours PSV 10/5 but had quite a lot of secretions requiring frequent suctioning CHES TTUBE REMOVED 3/21 - Temp 99.5. On 30% FiO2. Diuresed well overnight, 5L past 24 hours but still +8L net since admit. 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 On and off low grade fever 101F. On vent 30% fio2/ Per RN and patient - c/p severe cough an donly lidocaine neb helps. C/oc copious resp secretions. PER RN - due to copious secretions patient heading for trach 3/24 extubated 07-02-2023 care transferred to [facility] 07-03-2023 Pt turned down by CIR.  Antibiotic Therapy: Anti-infectives (From admission, onward)    Start     Dose/Rate Route Frequency Ordered Stop   07/01/23 1515  piperacillin-tazobactam (ZOSYN) IVPB 3.375 g  3.375 g 12.5 mL/hr over 240 Minutes Intravenous Every 8 hours 07/01/23 1415     06/30/23 1400  ciprofloxacin (CIPRO) IVPB 400 mg  Status:  Discontinued        400 mg 200 mL/hr over 60 Minutes Intravenous Every 8 hours 06/30/23 1224 07/01/23 1415   06/27/23 2200  vancomycin (VANCOREADY) IVPB 500 mg/100 mL  Status:  Discontinued        500 mg 100  mL/hr over 60 Minutes Intravenous Every 12 hours 06/27/23 1140 06/27/23 1619   06/27/23 2200  vancomycin (VANCOREADY) IVPB 500 mg/100 mL  Status:  Discontinued        500 mg 100 mL/hr over 60 Minutes Intravenous Every 12 hours 06/27/23 1619 06/27/23 1619   06/27/23 2200  vancomycin (VANCOCIN) 500 mg in sodium chloride 0.9 % 100 mL IVPB        500 mg 100 mL/hr over 60 Minutes Intravenous Every 12 hours 06/27/23 1623     06/25/23 1400  ciprofloxacin (CIPRO) IVPB 400 mg        400 mg 200 mL/hr over 60 Minutes Intravenous Every 8 hours 06/25/23 0748 06/29/23 2329   06/23/23 2200  meropenem (MERREM) 1 g in sodium chloride 0.9 % 100 mL IVPB  Status:  Discontinued        1 g 200 mL/hr over 30 Minutes Intravenous Every 8 hours 06/23/23 1108 06/25/23 0748   06/23/23 1130  meropenem (MERREM) 1 g in sodium chloride 0.9 % 100 mL IVPB        1 g 200 mL/hr over 30 Minutes Intravenous  Once 06/23/23 1057 06/23/23 1140   06/17/23 1000  vancomycin (VANCOREADY) IVPB 1250 mg/250 mL  Status:  Discontinued        1,250 mg 166.7 mL/hr over 90 Minutes Intravenous Every 24 hours 06/17/23 0709 06/17/23 0818   06/17/23 0900  vancomycin (VANCOREADY) IVPB 750 mg/150 mL  Status:  Discontinued        750 mg 150 mL/hr over 60 Minutes Intravenous Every 12 hours 06/17/23 0818 06/27/23 1140   06/16/23 1030  vancomycin (VANCOCIN) IVPB 1000 mg/200 mL premix  Status:  Discontinued        1,000 mg 200 mL/hr over 60 Minutes Intravenous Every 24 hours 06/16/23 1022 06/17/23 0709   06/15/23 1345  cefTRIAXone (ROCEPHIN) 2 g in sodium chloride 0.9 % 100 mL IVPB  Status:  Discontinued        2 g 200 mL/hr over 30 Minutes Intravenous Every 24 hours 06/15/23 1249 06/15/23 2226   06/14/23 1000  vancomycin (VANCOREADY) IVPB 750 mg/150 mL  Status:  Discontinued        750 mg 150 mL/hr over 60 Minutes Intravenous Every 24 hours 06/14/23 0852 06/16/23 1022   06/13/23 1940  cefTRIAXone (ROCEPHIN) 2 g in sodium chloride 0.9 % 100 mL IVPB   Status:  Discontinued        2 g 200 mL/hr over 30 Minutes Intravenous 2 times daily 06/13/23 1940 06/14/23 0802   06/13/23 1830  cefTRIAXone (ROCEPHIN) 2 g in sodium chloride 0.9 % 100 mL IVPB  Status:  Discontinued        2 g 200 mL/hr over 30 Minutes Intravenous Every 24 hours 06/13/23 1803 06/13/23 1940   06/13/23 1445  vancomycin (VANCOCIN) 500 mg in sodium chloride 0.9 % 100 mL IVPB        500 mg 100 mL/hr over 60 Minutes Intravenous  Once 06/13/23 1430 06/13/23 1627   06/13/23 1430  vancomycin (VANCOREADY) IVPB 500 mg/100 mL  Status:  Discontinued        500 mg 100 mL/hr over 60 Minutes Intravenous  Once 06/13/23 1426 06/13/23 1430   06/13/23 1400  vancomycin (VANCOCIN) IVPB 1000 mg/200 mL premix        1,000 mg 200 mL/hr over 60 Minutes Intravenous  Once 06/13/23 1354 06/13/23 1527   06/13/23 1345  ceFEPIme (MAXIPIME) 2 g in sodium chloride 0.9 % 100 mL IVPB        2 g 200 mL/hr over 30 Minutes Intravenous  Once 06/13/23 1335 06/13/23 1449       Procedures: 07-04-2023 RUE PICC removed. Single lumen Left UE PICC placed  Consultants: PCCM Infectious Disease    Assessment and Plan: * MRSA bacteremia 07-02-2023 TR[facility] assumed care on 07-01-2024. Pt has spent the last 18 prior days on PCCM service. Transesophageal Echo negative for endocarditis. ID states pt will need 8 weeks of IV Vanco. Start date is 06-15-2023.  So end date of therapy is Aug 09, 2023  07-03-2023  continue with IV Zosyn thru 07-07-2023 and IV Vanco through Aug 09, 2023. End date of IV vanco is 08-09-2023.  CM looking into SNF for IV ABX. Pt turned down by CIR. Started on low dose coreg to help with his tachycardia. Continue to monitor.  07-04-2023  continue with IV Zosyn thru 07-07-2023 and IV Vanco through Aug 09, 2023. End date of IV vanco is 08-09-2023.  CM looking into SNF for IV ABX. Pt turned down by CIR.  (Per ID, If pt goes to snf or stay inpatient, then plan 4 weeks iv abx until 07/13/2023 then finish the rest of  the 8 weeks with 2 weeks linezolid until 08/06/23, and then 2 weeks doxycycline).   07-05-2023 remains on IV Zosyn and IV vanco. CM looking into SNF for IV abx and rehab.  07-06-2023 suppose to be on IV zosyn through 07-07-2023. Then just single agent IV Vanco. Awaiting to see if TOC can arrange for SNF placement for him.  Sinus tachycardia 07-04-2023 likely due to severe deconditioning. Improved with coreg 6.25 mg bid.  07-05-2023 resolved with coreg 6.25 mg bid  07-06-2023 HR/BP stable on coreg 6.25 mg bid.  Critical illness myopathy 07-03-2023 pt turned down by inpatient rehab. Pt is uninsured. CM will attempt SNF placement with potential hospital guarantee. Continue with PT/OT.  07-04-2023 CM still looking for SNF bed.  07-05-2023 CM still looking for SNF bed. CIR declined to accept pt.  07-06-2023 CM still looking for SNF bed.  IVDU (intravenous drug user) 07-02-2023 pt states has used IV drugs in the past. 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 Pt states he was on suboxone in the past. He wants to try and get back on suboxone.  Discussed that his oxycodone dose would not be increased while he is in the hospital. And that he will not be given anymore IV opiates. Discussed with him that it would be best for him to get on suboxone therapy as soon as possible but he would need to stop all opiate therapy for 24-48 hours prior to starting suboxone. He states he will "think about it".  07-03-2023 pt unwilling to change to suboxone. Do NOT increase or give extra doses of oxycodone. Do NOT give any IV opiates/benzos. Start oxycodone taper next week.  07-05-2023 remains on oxycodone QID 10 mg. Pt not willing to try suboxone while in the hospital.  07-06-2023 continue qid oxycodone 10 mg. Do Not give PRN or IV opiates. Do not change his opiate Rx.  Septic discitis of lumbar region 07-02-2023 TR[facility] assumed care on 07-01-2024. Pt has spent  the last 18 prior days on PCCM service. Transesophageal Echo negative for endocarditis. ID states pt will need 8 weeks of IV Vanco. Start date is 06-15-2023.  So end date of therapy is Aug 09, 2023   07-03-2023  continue with IV Zosyn thru 07-07-2023 and IV Vanco through Aug 09, 2023. End date of IV vanco is 08-09-2023.  CM looking into SNF for IV ABX. Pt turned down by CIR.  07-04-2023  continue with IV Zosyn thru 07-07-2023 and IV Vanco through Aug 09, 2023. End date of IV vanco is 08-09-2023.  CM looking into SNF for IV ABX. Pt turned down by CIR.  (Per ID, If pt goes to snf or stay inpatient, then plan 4 weeks iv abx until 07/13/2023 then finish the rest of the 8 weeks with 2 weeks linezolid until 08/06/23, and then 2 weeks doxycycline).   07-05-2023 remains on IV Zosyn and IV vanco. CM looking into SNF for IV abx and rehab.  07-06-2023 suppose to be on IV zosyn through 07-07-2023. Then just single agent IV Vanco. Awaiting to see if TOC can arrange for SNF placement for him.   Acute respiratory failure with hypoxia (HCC) 07-02-2023 pt extubated on 06-30-2023. Currently on 2 L/min. Wean to RA if possible.  07-03-2023 continue to wean O2  07-04-2023 wean O2 if possible.  07-05-2023 attempt to wean. Will repeat CXR today  07-06-2023 CXR shows persistent infiltrate. Likely his left over septic emboli.  Ordered chest physiotherapy. Pt has a weak cough. Hopefully chest physiotherapy with help him cough up more.   Malnutrition of moderate degree Nutrition Status: Nutrition Problem: Moderate Malnutrition Etiology: chronic illness (polysubstance abuse) Signs/Symptoms: mild fat depletion, mild muscle depletion Interventions: Refer to RD note for recommendations, Tube feeding    Septic embolism (HCC) Present on admission.  Severe sepsis (HCC) Present on admission.  DVT prophylaxis: SCDs Start: 06/13/23 1755 Lovenox   Code Status: Full Code Family Communication: no family at bedside. Pt is  decisional Disposition Plan: SNF vs home Reason for continuing need for hospitalization: remains on IV Abx, supplemental O2.  Objective: Vitals:   07/05/23 1950 07/05/23 2123 07/06/23 0500 07/06/23 0651  BP: 139/76   124/74  Pulse: (!) 101   95  Resp: 18   18  Temp: 100.3 F (37.9 C) 98.5 F (36.9 C)  98 F (36.7 C)  TempSrc: Oral Oral  Oral  SpO2: 96%   94%  Weight:   63.3 kg   Height:        Intake/Output Summary (Last 24 hours) at 07/06/2023 1010 Last data filed at 07/06/2023 0918 Gross per 24 hour  Intake 1164.68 ml  Output 2325 ml  Net -1160.32 ml   Filed Weights   07/03/23 0516 07/05/23 0500 07/06/23 0500  Weight: 62.3 kg 63.1 kg 63.3 kg   Examination:  Physical Exam Vitals and nursing note reviewed.  Constitutional:      General: He is not in acute distress.    Appearance: He is not toxic-appearing or diaphoretic.     Comments: Chronically ill appearing  HENT:     Head: Normocephalic and atraumatic.     Nose: Nose normal.  Cardiovascular:     Rate and Rhythm: Normal rate and regular rhythm.  Pulmonary:     Effort: Pulmonary  effort is normal.     Comments: Wet sounding cough Abdominal:     General: Abdomen is flat. Bowel sounds are normal.     Palpations: Abdomen is soft.  Musculoskeletal:     Comments: Left UE single lumen PICC line.  Skin:    General: Skin is warm and dry.     Capillary Refill: Capillary refill takes less than 2 seconds.  Neurological:     Mental Status: He is alert and oriented to person, place, and time.   Data Reviewed: I have personally reviewed following labs and imaging studies  CBC: Recent Labs  Lab 06/30/23 0354 07/01/23 0413 07/02/23 0448 07/03/23 0407 07/04/23 0006  WBC 10.7* 13.9* 15.4* 15.2* 13.2*  NEUTROABS  --  10.0* 11.8* 10.8* 8.9*  HGB 7.9* 8.3* 8.6* 8.5* 8.0*  HCT 25.8* 26.4* 27.8* 28.0* 25.7*  MCV 89.9 88.0 88.0 89.7 87.7  PLT 345 388 427* 478* 501*   Basic Metabolic Panel: Recent Labs  Lab  06/30/23 0354 07/01/23 0413 07/02/23 0448 07/03/23 0407 07/04/23 0006  NA 139 137 137 135 133*  K 3.6 3.2* 3.7 3.5 3.3*  CL 101 99 101 98 97*  CO2 29 29 27 29 29   GLUCOSE 166* 114* 131* 121* 119*  BUN 45* 35* 25* 24* 23*  CREATININE 1.41* 1.19 1.08 1.13 1.04  CALCIUM 8.0* 7.9* 8.4* 8.3* 8.1*  MG 2.1 2.1 2.2 2.1  --   PHOS 5.5* 4.6 4.1  --   --    GFR: Estimated Creatinine Clearance: 76.9 mL/min (by C-G formula based on SCr of 1.04 mg/dL). Liver Function Tests: Recent Labs  Lab 07/01/23 0413 07/02/23 0448 07/03/23 0407  AST 51* 48* 46*  ALT 48* 49* 47*  ALKPHOS 108 117 110  BILITOT 0.6 0.5 0.2  PROT 8.3* 8.6* 8.3*  ALBUMIN 1.9* 1.9* 1.9*   CBG: Recent Labs  Lab 07/03/23 0719 07/03/23 1154 07/03/23 1656 07/03/23 2026 07/04/23 0716  GLUCAP 127* 135* 120* 88 130*   Recent Results (from the past 240 hours)  Culture, Respiratory w Gram Stain     Status: None   Collection Time: 06/29/23  1:25 PM   Specimen: Tracheal Aspirate; Respiratory  Result Value Ref Range Status   Specimen Description   Final    TRACHEAL ASPIRATE Performed at [facility], [address], [place], [place] [address]    Special Requests   Final    NONE Performed at [facility], [address], [place], [place] [address]    Gram Stain   Final    NO WBC SEEN RARE GRAM POSITIVE COCCI RARE GRAM NEGATIVE RODS    Culture   Final    ABUNDANT METHICILLIN RESISTANT STAPHYLOCOCCUS AUREUS FEW PSEUDOMONAS [facility]ER[facility]OSA [facility] DEPARTMENT NOTIFIED Performed at [facility], [address] [place], [place] [address]    Report Status 07/01/2023 FINAL  Final   Organism ID, Bacteria METHICILLIN RESISTANT STAPHYLOCOCCUS AUREUS  Final   Organism ID, Bacteria PSEUDOMONAS AERUGINOSA  Final      Susceptibility   Methicillin resistant staphylococcus aureus - MIC*    CIPROFLOXACIN >=8 RESISTANT Resistant     ERYTHROMYCIN >=8 RESISTANT Resistant     GENTAMICIN  <=0.5 SENSITIVE Sensitive     OXACILLIN >=4 RESISTANT Resistant     TETRACYCLINE <=1 SENSITIVE Sensitive     VANCOMYCIN 1 SENSITIVE Sensitive     TRIMETH/SULFA >=320 RESISTANT Resistant     CLINDAMYCIN <=0.25 SENSITIVE Sensitive     RIFAMPIN <=0.5  SENSITIVE Sensitive     Inducible Clindamycin NEGATIVE Sensitive     LINEZOLID 2 SENSITIVE Sensitive     * ABUNDANT METHICILLIN RESISTANT STAPHYLOCOCCUS AUREUS   Pseudomonas aeruginosa - MIC*    CEFTAZIDIME 2 SENSITIVE Sensitive     CIPROFLOXACIN 2 RESISTANT Resistant     GENTAMICIN <=1 SENSITIVE Sensitive     IMIPENEM >=16 RESISTANT Resistant     PIP/TAZO <=4 SENSITIVE Sensitive ug/mL    CEFEPIME 2 SENSITIVE Sensitive     * FEW PSEUDOMONAS AERUGINOSA  Culture, blood (Routine X 2) w Reflex to ID Panel     Status: None   Collection Time: 06/30/23 10:22 AM   Specimen: BLOOD  Result Value Ref Range Status   Specimen Description   Final    BLOOD BLOOD LEFT HAND AEROBIC BOTTLE ONLY Performed at [facility], [address], [place], [place] [address]    Special Requests   Final    BOTTLES DRAWN AEROBIC ONLY Blood Culture results may not be optimal due to an inadequate volume of blood received in culture bottles Performed at [facility], [address], [place], [place] [address]    Culture   Final    NO GROWTH 5 DAYS Performed at [facility], [address], [place], [place] [address]    Report Status 07/05/2023 FINAL  Final  Culture, blood (Routine X 2) w Reflex to ID Panel     Status: None   Collection Time: 06/30/23 10:28 AM   Specimen: BLOOD  Result Value Ref Range Status   Specimen Description   Final    BLOOD BLOOD LEFT HAND AEROBIC BOTTLE ONLY Performed at [facility], [address], [place], [place] [address]    Special Requests   Final    BOTTLES DRAWN AEROBIC ONLY Blood Culture results may not be optimal due to an inadequate volume of blood received in  culture bottles Performed at [facility], [address], [place], [place] [address]    Culture   Final    NO GROWTH 5 DAYS Performed at [facility], [address], [place], [place] [address]    Report Status 07/05/2023 FINAL  Final     Radiology Studies: DG Chest 2 View Result Date: 07/05/2023 CLINICAL DATA:  Pneumonia. EXAM: CHEST - 2 VIEW COMPARISON:  June 30, 2023. FINDINGS: Stable cardiomediastinal silhouette. Endotracheal tube has been removed. Left-sided PICC line is noted with distal tip in expected position of SVC. Mildly increased bilateral lung opacities are noted concerning for worsening pneumonia. Nodular density is noted in right lung base which may be inflammatory in etiology, although neoplasm cannot be excluded. Bony thorax is unremarkable. IMPRESSION: Mildly increased bilateral lung opacities are noted concerning for worsening multifocal pneumonia. Nodular density is noted in right lung base which may be inflammatory in etiology, but neoplasm cannot be excluded. Electronically Signed   By: Lupita Raider M.D.   On: 07/05/2023 13:52    Scheduled Meds:  carvedilol  6.25 mg Oral BID WC   Chlorhexidine Gluconate Cloth  6 each Topical Daily   chlorpheniramine-HYDROcodone  5 mL Oral Q12H   clonazePAM  1 mg Oral BID   enoxaparin (LOVENOX) injection  60 mg Subcutaneous Q12H   feeding supplement  237 mL Oral TID BM   guaiFENesin  20 mL Oral BID   lidocaine  1 patch Transdermal Q24H   multivitamin with minerals  1 tablet Oral Daily   oxyCODONE  10 mg Oral QID   pantoprazole  40 mg Oral Daily   polyethylene glycol  17 g Oral Daily   senna-docusate  2 tablet Oral BID   sodium chloride flush  10-40 mL Intracatheter Q12H   thiamine  100 mg Oral Daily   Continuous Infusions:  piperacillin-tazobactam (ZOSYN)  IV 3.375 g (07/06/23 1610)   vancomycin 1,250 mg (07/06/23 0918)     LOS: 23 days   Time spent: 40 minutes  Carollee Herter, DO  [facility]  07/06/2023, 10:10 AM

## 2023-07-06 NOTE — Plan of Care (Signed)
  Problem: Clinical Measurements: Goal: Cardiovascular complication will be avoided Outcome: Progressing   Problem: Clinical Measurements: Goal: Respiratory complications will improve Outcome: Progressing   

## 2023-07-07 LAB — BASIC METABOLIC PANEL WITH GFR
Anion gap: 11 (ref 5–15)
BUN: 24 mg/dL — ABNORMAL HIGH (ref 6–20)
CO2: 28 mmol/L (ref 22–32)
Calcium: 8.6 mg/dL — ABNORMAL LOW (ref 8.9–10.3)
Chloride: 93 mmol/L — ABNORMAL LOW (ref 98–111)
Creatinine, Ser: 0.92 mg/dL (ref 0.61–1.24)
GFR, Estimated: >60 mL/min (ref >60)
Glucose, Bld: 124 mg/dL — ABNORMAL HIGH (ref 70–99)
Potassium: 3.6 mmol/L (ref 3.5–5.1)
Sodium: 132 mmol/L — ABNORMAL LOW (ref 135–145)

## 2023-07-07 LAB — VANCOMYCIN, TROUGH: Vancomycin Tr: 10 ug/mL — ABNORMAL LOW (ref 15–20)

## 2023-07-07 LAB — VANCOMYCIN, PEAK: Vancomycin Pk: 46 ug/mL — ABNORMAL HIGH (ref 30–40)

## 2023-07-07 MED ORDER — VANCOMYCIN HCL 500 MG/100ML IV SOLN
500.0000 mg | Freq: Two times a day (BID) | INTRAVENOUS | Status: DC
  Administered 2023-07-08 – 2023-07-13 (×12): 500 mg via INTRAVENOUS
  Filled 2023-07-07 (×14): qty 100

## 2023-07-07 MED ORDER — OXYCODONE HCL 5 MG PO TABS
10.0000 mg | ORAL_TABLET | Freq: Four times a day (QID) | ORAL | Status: DC
  Administered 2023-07-07 – 2023-07-14 (×29): 10 mg via ORAL
  Filled 2023-07-07 (×29): qty 2

## 2023-07-07 NOTE — Progress Notes (Signed)
 [facility]  Date of Admission:  06/13/2023     Lines: 3/29-c lue picc  3/13-3/29 Rue picc   Abx: 3/25-c piptazo 3/07-c vancomycin   3/19-3/25 cipro 3/17-19 meropenem 3/07-09 ceftriaxone   ASSESSMENT: 48 yo male polysubstance abuse (heroin/fentanyl), biba for ams found to have severe sepsis and mrsa bacteremia, complicated by respiratory failure  Other ID problems include: Bilateral pulm nodules presumed mrsa L5 vertebral om with epidural phlegmon Ongoing fever and HAP with pseudomonas aeruginosa Rash on cefepime  Other issues: Left pneumothorax s/p chest tube 3/14-3/19 Bilateral common iliac veins filling defects on abd pelv ct -- on tx dose enoxoparin Resp failure extubated 3/24    #mrsa bsi #bilateral pulm septic nodules #l4-5 vertebral om and epidural phlegmon 3/07 bcx mrsa 3/07 urine cx negative 3/09 bcx negative  3/08 Tte and 3/13 tee no sign of valve vegetation 3/14 abd pelv ct with contrast bilateral common iliac vein filling defect but no occult abscess 3/15 thoracic and lumbar spine L4-5 vertebral om and epidural phlegmon 3/25 last fever before transitioning to piptazo 3/31 clinically overall feeling better; still weak. On 2 liters o2 via [place]. No focal pain    -continue vanc -will need prolong treatment at least 8 weeks abx given vertebral osteomyelitis -primary team planning disposition; patient is not opat candidate. If he goes to snf or stay inpatient, I plan 4 weeks iv abx until 07/13/2023 then finish the rest of the 8 weeks with (2 weeks linezolid until 08/06/23, and then 2 weeks doxycycline). Longer abx requirement pending further evaluation      #hap/vap #question of rash with cefepime here during admission 3/16 sputum cx with PsA (S cefepime/ceftaz,cipro, imipenem); and mrsa 3/23 repeat sputum cx pseudomonas resistant to cipro/imipenem patient takes amox previously -- our pharmacy team confirmed with  his mother Switched cipro to piptazo 3/25 but fever curve was already better 3/31 on 2 liters o2; minimal to no cough; no dyspnea  -finish 7 days piptazo until 07/07/23    #ongoing fever No sign of drug related fevever 3/23 resp cx still with mrsa and pseudomonas (R cipro/imipenem) 3/24 repeat bcx negative 3/31 ?hap vs occult abscess somewhere due to [facility]; he does have new reactive thrombocytosis and still mild-moderate leukocytosis 13-15 on 07/03/23 Last true fever 3/25 and just prior to piptazo transition  -next workup to be intraabd occult abscess if ongoing sepsis and will reevaluate for other metastatic involvement with mrsa    #ams resolved Initially at admission Head ct unremarkable 3/07 lp me panel negative; no cx; protein >600; glucose 87; wbc clotted   PLAN: Finish piptazo today Inpatient or snf treatment for IV vancomycin of 4 weeks, then finish the latter 4 weeks with oral abx as below A) 4 weeks iv abx until 07/13/2023  B) start 2 weeks linezolid 07/14/23 until 08/06/23, and then (at least, depending on clinical improvement) 2 weeks doxycycline 08/07/23 Maintain contact isolation precaution Weekly cbc, cmp, crp, and vancomycin to monitor abx toxicity and clinical change Discussed with primary team      Principal Problem:   MRSA bacteremia Active Problems:   Severe sepsis (HCC)   Septic embolism (HCC)   Malnutrition of moderate degree   Acute respiratory failure with hypoxia (HCC)   Septic discitis of lumbar region   IVDU (intravenous drug user)   Critical illness myopathy   Sinus tachycardia   Allergies  Allergen Reactions   Cefepime Rash    Scheduled  Meds:  carvedilol  6.25 mg Oral BID WC   Chlorhexidine Gluconate Cloth  6 each Topical Daily   chlorpheniramine-HYDROcodone  5 mL Oral Q12H   clonazePAM  1 mg Oral BID   enoxaparin (LOVENOX) injection  60 mg Subcutaneous Q12H   feeding supplement  237 mL Oral TID BM   guaiFENesin  20 mL Oral BID    lidocaine  1 patch Transdermal Q24H   multivitamin with minerals  1 tablet Oral Daily   oxyCODONE  10 mg Oral QID   pantoprazole  40 mg Oral Daily   polyethylene glycol  17 g Oral Daily   senna-docusate  2 tablet Oral BID   sodium chloride flush  10-40 mL Intracatheter Q12H   thiamine  100 mg Oral Daily   Continuous Infusions:  piperacillin-tazobactam (ZOSYN)  IV 3.375 g (07/07/23 0607)   vancomycin 1,250 mg (07/07/23 1156)   PRN Meds:.acetaminophen (TYLENOL) oral liquid 160 mg/5 mL, acetaminophen, albuterol, bisacodyl, ibuprofen, lidocaine, methocarbamol, metoprolol tartrate, mouth rinse, mouth rinse, mouth rinse, phenol, sodium chloride flush   SUBJECTIVE: Feels overall better  Still very weak  Doing pt/ot  No n/v/d/rash No focal pain No headache/eye pain or decreased vision   Review of Systems: ROS All other ROS was negative, except mentioned above     OBJECTIVE: Vitals:   07/06/23 1936 07/06/23 2027 07/07/23 0452 07/07/23 0500  BP: 122/68  134/73   Pulse: 100 (!) 102 94   Resp: 18 (!) 24 18   Temp: 99 F (37.2 C)  98 F (36.7 C)   TempSrc: Oral  Oral   SpO2: 97% 96% 95%   Weight:    60.8 kg  Height:       Body mass index is 18.69 kg/m.  Physical Exam General/constitutional: no distress, pleasant; on 4 liters o2 [place] supplement HEENT: Normocephalic, PER, Conj Clear, EOMI, Oropharynx clear CV: rrr no mrg Lungs: normal respiratory effort Abd: Soft, Nontender Ext: no edema Skin: No Rash Neuro: nonfocal -- generalized weakness MSK: no peripheral joint swelling/tenderness/warmth   Central line presence: LUE picc site no erythema/purulence/tenderness   Lab Results Lab Results  Component Value Date   WBC 13.2 (H) 07/04/2023   HGB 8.0 (L) 07/04/2023   HCT 25.7 (L) 07/04/2023   MCV 87.7 07/04/2023   PLT 501 (H) 07/04/2023    Lab Results  Component Value Date   CREATININE 0.92 07/07/2023   BUN 24 (H) 07/07/2023   NA 132 (L) 07/07/2023   K 3.6  07/07/2023   CL 93 (L) 07/07/2023   CO2 28 07/07/2023    Lab Results  Component Value Date   ALT 47 (H) 07/03/2023   AST 46 (H) 07/03/2023   ALKPHOS 110 07/03/2023   BILITOT 0.2 07/03/2023      Microbiology: Recent Results (from the past 240 hours)  Culture, Respiratory w Gram Stain     Status: None   Collection Time: 06/29/23  1:25 PM   Specimen: Tracheal Aspirate; Respiratory  Result Value Ref Range Status   Specimen Description   Final    TRACHEAL ASPIRATE Performed at [facility], [address] [place], [place] [address]    Special Requests   Final    NONE Performed at [facility], [address] [place], [place] [address]    Gram Stain   Final    NO WBC SEEN RARE GRAM POSITIVE COCCI RARE GRAM NEGATIVE RODS    Culture   Final    ABUNDANT  METHICILLIN RESISTANT STAPHYLOCOCCUS AUREUS FEW PSEUDOMONAS AERUGINOSA HEALTH DEPARTMENT NOTIFIED Performed at [facility], [address] [place], [place] [address]    Report Status 07/01/2023 FINAL  Final   Organism ID, Bacteria METHICILLIN RESISTANT STAPHYLOCOCCUS AUREUS  Final   Organism ID, Bacteria PSEUDOMONAS AERUGINOSA  Final      Susceptibility   Methicillin resistant staphylococcus aureus - MIC*    CIPROFLOXACIN >=8 RESISTANT Resistant     ERYTHROMYCIN >=8 RESISTANT Resistant     GENTAMICIN <=0.5 SENSITIVE Sensitive     OXACILLIN >=4 RESISTANT Resistant     TETRACYCLINE <=1 SENSITIVE Sensitive     VANCOMYCIN 1 SENSITIVE Sensitive     TRIMETH/SULFA >=320 RESISTANT Resistant     CLINDAMYCIN <=0.25 SENSITIVE Sensitive     RIFAMPIN <=0.5 SENSITIVE Sensitive     Inducible Clindamycin NEGATIVE Sensitive     LINEZOLID 2 SENSITIVE Sensitive     * ABUNDANT METHICILLIN RESISTANT STAPHYLOCOCCUS AUREUS   Pseudomonas aeruginosa - MIC*    CEFTAZIDIME 2 SENSITIVE Sensitive     CIPROFLOXACIN 2 RESISTANT Resistant     GENTAMICIN <=1 SENSITIVE Sensitive     IMIPENEM >=16  RESISTANT Resistant     PIP/TAZO <=4 SENSITIVE Sensitive ug/mL    CEFEPIME 2 SENSITIVE Sensitive     * FEW PSEUDOMONAS AERUGINOSA  Culture, blood (Routine X 2) w Reflex to ID Panel     Status: None   Collection Time: 06/30/23 10:22 AM   Specimen: BLOOD  Result Value Ref Range Status   Specimen Description   Final    BLOOD BLOOD LEFT HAND AEROBIC BOTTLE ONLY Performed at [facility], [address] [place], [place] [address]    Special Requests   Final    BOTTLES DRAWN AEROBIC ONLY Blood Culture results may not be optimal due to an inadequate volume of blood received in culture bottles Performed at [facility], [address] [place], [place] [address]    Culture   Final    NO GROWTH 5 DAYS Performed at [facility], [address] [place], [place] [address]    Report Status 07/05/2023 FINAL  Final  Culture, blood (Routine X 2) w Reflex to ID Panel     Status: None   Collection Time: 06/30/23 10:28 AM   Specimen: BLOOD  Result Value Ref Range Status   Specimen Description   Final    BLOOD BLOOD LEFT HAND AEROBIC BOTTLE ONLY Performed at [facility], [address] [place], [place] [address]    Special Requests   Final    BOTTLES DRAWN AEROBIC ONLY Blood Culture results may not be optimal due to an inadequate volume of blood received in culture bottles Performed at [facility], [address], [place], [place] [address]    Culture   Final    NO GROWTH 5 DAYS Performed at [facility], [address], [place], [place] [address]    Report Status 07/05/2023 FINAL  Final     Serology:   Imaging: If present, new imagings (plain films, ct scans, and mri) have been personally visualized and interpreted; radiology reports have been reviewed. Decision making incorporated into the Impression / Recommendations.  06/21/23 mri lumbar spine wwo contrast 1. Abnormal edema and enhancement  affecting the L5 vertebral body which could go along with osteomyelitis. The L4-5 and L5-S1 disc spaces are normal. 2. Abnormal appearance of the paravertebral soft tissues surrounding L5 including the epidural space  at L5-S1 with edema and enhancement consistent with inflammatory change. The epidural changes are phlegmonous at this point in time without what I would characterize as a frank epidural abscess.   3/15 mri thoracic spine wwo contrast 1. No evidence of discitis, osteomyelitis or septic facet arthritis. 2. Extensive patchy bilateral pulmonary pathology, not well evaluated using this technique.   3/14 ct abd pelv with contrast 1. Incompletely visualized small to moderate left pneumothorax at the left lung base. 2. Negative for rim enhancing fluid collection within the abdomen or pelvis to suggest drainable abscess. 3. Contracted gallbladder with possible wall thickening but no calcified stones. 4. Persistent bilateral lower lobe consolidations with areas of heterogeneous low density suggestive of necrotic infection. Multiple cavitary lung lesions corresponding to history of septic emboli. Small bilateral pleural effusions. 5. Suspicion of hypodense filling defect within the bilateral common iliac veins right greater than left, suspicious for DVT. 6. Question small volume low-density fluid along the right anterior margins of the L4 and L5 vertebral bodies. No overt osseous destructive change or abnormal appearance of the disc spaces but presence of spine infection cannot be excluded and correlation with MRI should be considered. 7. Small free fluid within the abdomen and pelvis. Presacral soft tissue stranding and small volume fluid. Mild generalized subcutaneous edema. 8. Slightly thick-walled urinary bladder, decompressed by Foley catheter.    3/13 tee  1. Left ventricular ejection fraction, by estimation, is 45 to 50%. The  left ventricle has mildly decreased  function.   2. Right ventricular systolic function is normal. The right ventricular  size is normal.   3. No left atrial/left atrial appendage thrombus was detected.   4. The mitral valve is grossly normal. Trivial mitral valve  regurgitation.   5. The aortic valve is tricuspid. Aortic valve regurgitation is not  visualized.   6. Agitated saline contrast bubble study was positive with shunting  observed within 3-6 cardiac cycles suggestive of interatrial shunt, Grade  1.   Conclusion(s)/Recommendation(s): No evidence of vegetation/infective  endocarditis on this transesophageael echocardiogram.      3/9 ct chest without contrast 1. Very extensive heterogeneous and consolidative airspace disease throughout the lungs, much of this nodular in appearance, with several cavitary nodules present. Findings are highly suspicious for septic emboli given stated clinical concern. 2. Small bilateral pleural effusions. 3. Cardiomegaly.   3/08 tte  1. Left ventricular ejection fraction, by estimation, is 40 to 45%. The  left ventricle has mildly decreased function. The left ventricle  demonstrates global hypokinesis. The left ventricular internal cavity size  was mildly dilated. There is mild left ventricular hypertrophy. Left ventricular diastolic parameters were normal.   2. Right ventricular systolic function is moderately reduced. The right  ventricular size is moderately enlarged. Tricuspid regurgitation signal is  inadequate for assessing PA pressure.   3. The mitral valve is abnormal. Trivial mitral valve regurgitation. No  evidence of mitral stenosis.   4. The aortic valve is tricuspid. Aortic valve regurgitation is not  visualized. No aortic stenosis is present.   5. The inferior vena cava is normal in size with greater than 50%  respiratory variability, suggesting right atrial pressure of 3 mmHg.   6. Agitated saline contrast bubble study was negative, with no evidence  of any  interatrial shunt.      Raymondo Band, MD [facility] [phone] pager    07/07/2023, 12:04 PM

## 2023-07-07 NOTE — Progress Notes (Addendum)
 PROGRESS NOTE    Brendan Preston  ZOX:[id number] DOB: 20-Feb-1976 DOA: 06/13/2023 PCP: Patient, No Pcp Per  Subjective: Pt seen and examined. Eating well No fevers. Today should be last day of IV zosyn. Then he will just be on single agent IV Vanco.  Still waiting for him to get a shower. Asked RN/PT to help him into shower.   Hospital Course: HPI: Pt encephalopathic, therefore HPI obtained from chart review.  See additional chart w/MRN [id number]   71 yoM with prior hx of known polysubstance (heroin/ fentanyl) abuse in which EMS was called out for AMS, last seen awake 3 days ago covered in urine and feces.    In ER, temp 104.3 rectal, tachycardic, normo to slightly hypertensive, sats 100% on NRB> but maintaining sats on RA with GCS ~10 but protecting airway.  Labs significant for lactic 8.4, WBC 15, plts 96, VBG 7.29/ 44,  CXR neg, RVP neg, EKG without ST changes, qtc borderline, CTH official read pending, trop hs 694, INR 1.3.  Foley placed- urine cloudy, glucose 150, Hgb 150 (-RBC), neg leuks/nitrates, WBC 6-10, UDS positive for THC and opiates.  CMET and other labs had to be redrawn and still awaiting due to hemolysis.  Cultures sent, empiric vanc/ cefepime given, 2L LR.  PCCM called for admit.  Significant Events: Admitted 06/13/2023 for severe sepsis 3/7 admit, encephalopathic, altered, drug rash developed after cefepime. BLOOD UCLURE MRSA 3/8 TTE negative for endocarditis 3/10 he had a TEE scheduled but sounds like he has declined this . Larey Seat out of bed late day shift and broke his nose. Bcx still pos. 3/11 Tubed in afternoon. CRP high.  3/12 changing his pain reg/ PAD 3/13 on vent 3/14 CT abdomen showed left pneumothorax, bilateral lower lobe consolidation, multiple cavitary lesions, suspicious for bilateral common iliac DVT, fluid collection along L4/L5 vertebral bodies. 3/14 left pigtail chest tube for pneumothorax 3/15 started on IV heparin, venous duplex negative, MRI lumbar  spine suggests L5 osteo/no epidural abscess 3/17 changing IV heparin to LMWH as heparin levels not detectable. Still having marked trach secretions. Growing PA so added meropenem (given his rash w/ cefepime). Changed fent to dilaudid at 2mg /hr. CT clamped.  3/18 hgb down to 6.6 got 1 unit PRBC. No obvious bleeding source. Was >13 liters positive so also starting diuretics. Dec'd dilaudid dose to 1.5mg /hr. no PTX so CT removed.  3/19 tolerated 7 hours PSV 10/5 but had quite a lot of secretions requiring frequent suctioning CHES TTUBE REMOVED 3/21 - Temp 99.5. On 30% FiO2. Diuresed well overnight, 5L past 24 hours but still +8L net since admit. 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 On and off low grade fever 101F. On vent 30% fio2/ Per RN and patient - c/p severe cough an donly lidocaine neb helps. C/oc copious resp secretions. PER RN - due to copious secretions patient heading for trach 3/24 extubated 07-02-2023 care transferred to [facility] 07-03-2023 Pt turned down by CIR. 07-07-2023 updated ID recs:4 weeks iv abx until 07/13/2023 then finish the rest of the 8 weeks with (2 weeks linezolid until 08/06/23, and then 2 weeks doxycycline)   Antibiotic Therapy: Anti-infectives (From admission, onward)    Start     Dose/Rate Route Frequency Ordered Stop  07/01/23 1515  piperacillin-tazobactam (ZOSYN) IVPB 3.375 g        3.375 g 12.5 mL/hr over 240 Minutes Intravenous Every 8 hours 07/01/23 1415     06/30/23 1400  ciprofloxacin (CIPRO) IVPB 400 mg  Status:  Discontinued        400 mg 200 mL/hr over 60 Minutes Intravenous Every 8 hours 06/30/23 1224 07/01/23 1415   06/27/23 2200  vancomycin (VANCOREADY) IVPB 500 mg/100 mL  Status:   Discontinued        500 mg 100 mL/hr over 60 Minutes Intravenous Every 12 hours 06/27/23 1140 06/27/23 1619   06/27/23 2200  vancomycin (VANCOREADY) IVPB 500 mg/100 mL  Status:  Discontinued        500 mg 100 mL/hr over 60 Minutes Intravenous Every 12 hours 06/27/23 1619 06/27/23 1619   06/27/23 2200  vancomycin (VANCOCIN) 500 mg in sodium chloride 0.9 % 100 mL IVPB        500 mg 100 mL/hr over 60 Minutes Intravenous Every 12 hours 06/27/23 1623     06/25/23 1400  ciprofloxacin (CIPRO) IVPB 400 mg        400 mg 200 mL/hr over 60 Minutes Intravenous Every 8 hours 06/25/23 0748 06/29/23 2329   06/23/23 2200  meropenem (MERREM) 1 g in sodium chloride 0.9 % 100 mL IVPB  Status:  Discontinued        1 g 200 mL/hr over 30 Minutes Intravenous Every 8 hours 06/23/23 1108 06/25/23 0748   06/23/23 1130  meropenem (MERREM) 1 g in sodium chloride 0.9 % 100 mL IVPB        1 g 200 mL/hr over 30 Minutes Intravenous  Once 06/23/23 1057 06/23/23 1140   06/17/23 1000  vancomycin (VANCOREADY) IVPB 1250 mg/250 mL  Status:  Discontinued        1,250 mg 166.7 mL/hr over 90 Minutes Intravenous Every 24 hours 06/17/23 0709 06/17/23 0818   06/17/23 0900  vancomycin (VANCOREADY) IVPB 750 mg/150 mL  Status:  Discontinued        750 mg 150 mL/hr over 60 Minutes Intravenous Every 12 hours 06/17/23 0818 06/27/23 1140   06/16/23 1030  vancomycin (VANCOCIN) IVPB 1000 mg/200 mL premix  Status:  Discontinued        1,000 mg 200 mL/hr over 60 Minutes Intravenous Every 24 hours 06/16/23 1022 06/17/23 0709   06/15/23 1345  cefTRIAXone (ROCEPHIN) 2 g in sodium chloride 0.9 % 100 mL IVPB  Status:  Discontinued        2 g 200 mL/hr over 30 Minutes Intravenous Every 24 hours 06/15/23 1249 06/15/23 2226   06/14/23 1000  vancomycin (VANCOREADY) IVPB 750 mg/150 mL  Status:  Discontinued        750 mg 150 mL/hr over 60 Minutes Intravenous Every 24 hours 06/14/23 0852 06/16/23 1022   06/13/23 1940  cefTRIAXone (ROCEPHIN) 2 g in  sodium chloride 0.9 % 100 mL IVPB  Status:  Discontinued        2 g 200 mL/hr over 30 Minutes Intravenous 2 times daily 06/13/23 1940 06/14/23 0802   06/13/23 1830  cefTRIAXone (ROCEPHIN) 2 g in sodium chloride 0.9 % 100 mL IVPB  Status:  Discontinued        2 g 200 mL/hr over 30 Minutes Intravenous Every 24 hours 06/13/23 1803 06/13/23 1940   06/13/23 1445  vancomycin (VANCOCIN) 500 mg in sodium chloride 0.9 % 100 mL IVPB        500 mg 100 mL/hr  over 60 Minutes Intravenous  Once 06/13/23 1430 06/13/23 1627   06/13/23 1430  vancomycin (VANCOREADY) IVPB 500 mg/100 mL  Status:  Discontinued        500 mg 100 mL/hr over 60 Minutes Intravenous  Once 06/13/23 1426 06/13/23 1430   06/13/23 1400  vancomycin (VANCOCIN) IVPB 1000 mg/200 mL premix        1,000 mg 200 mL/hr over 60 Minutes Intravenous  Once 06/13/23 1354 06/13/23 1527   06/13/23 1345  ceFEPIme (MAXIPIME) 2 g in sodium chloride 0.9 % 100 mL IVPB        2 g 200 mL/hr over 30 Minutes Intravenous  Once 06/13/23 1335 06/13/23 1449       Procedures: 07-04-2023 RUE PICC removed. Single lumen Left UE PICC placed  Consultants: PCCM Infectious Disease    Assessment and Plan: * MRSA bacteremia 07-02-2023 [facility] assumed care on 07-01-2024. Pt has spent the last 18 prior days on PCCM service. Transesophageal Echo negative for endocarditis. ID states pt will need 8 weeks of IV Vanco. Start date is 06-15-2023.  So end date of therapy is Aug 09, 2023  07-03-2023  continue with IV Zosyn thru 07-07-2023 and IV Vanco through Aug 09, 2023. End date of IV vanco is 08-09-2023.  CM looking into SNF for IV ABX. Pt turned down by CIR. Started on low dose coreg to help with his tachycardia. Continue to monitor.  07-04-2023  continue with IV Zosyn thru 07-07-2023 and IV Vanco through Aug 09, 2023. End date of IV vanco is 08-09-2023.  CM looking into SNF for IV ABX. Pt turned down by CIR.  (Per ID, If pt goes to snf or stay inpatient, then plan 4 weeks iv abx until  07/13/2023 then finish the rest of the 8 weeks with 2 weeks linezolid until 08/06/23, and then 2 weeks doxycycline).   07-05-2023 remains on IV Zosyn and IV vanco. CM looking into SNF for IV abx and rehab.  07-06-2023 suppose to be on IV zosyn through 07-07-2023. Then just single agent IV Vanco. Awaiting to see if [facility] can arrange for SNF placement for him.  07-07-2023 today should be last day of IV Zosyn. Then he can just get IV Vanco. Hopefully TOC/CM/SW can find him SNF to take him to complete IV ABX therapy and PT. *update from ID recs. 4 weeks iv abx until 07/13/2023 then finish the rest of the 8 weeks with (2 weeks linezolid until 08/06/23, and then 2 weeks doxycycline)   Sinus tachycardia 07-04-2023 likely due to severe deconditioning. Improved with coreg 6.25 mg bid.  07-05-2023 resolved with coreg 6.25 mg bid  07-06-2023 HR/BP stable on coreg 6.25 mg bid.  07-06-2023 stable on coreg 6.25 mg bid.  Critical illness myopathy 07-03-2023 pt turned down by inpatient rehab. Pt is uninsured. CM will attempt SNF placement with potential hospital guarantee. Continue with PT/OT.  07-04-2023 CM still looking for SNF bed.  07-05-2023 CM still looking for SNF bed. CIR declined to accept pt.  07-06-2023 CM still looking for SNF bed.  07-07-2023 CM still looking for SNF bed.  IVDU (intravenous drug user) 07-02-2023 pt states has used IV drugs in the past. 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 Pt states he was on suboxone in the past. He wants to try and get back on suboxone.  Discussed that his oxycodone dose would not be increased while he is in the hospital. And that he will  not be given anymore IV opiates. Discussed with him that it would be best for him to get on suboxone therapy as soon as possible but he would need to stop all opiate therapy for 24-48 hours prior to starting suboxone. He states he will "think about it".  07-03-2023 pt  unwilling to change to suboxone. Do NOT increase or give extra doses of oxycodone. Do NOT give any IV opiates/benzos. Start oxycodone taper next week.  07-05-2023 remains on oxycodone QID 10 mg. Pt not willing to try suboxone while in the hospital.  07-06-2023 continue qid oxycodone 10 mg. Do Not give PRN or IV opiates. Do not change his opiate Rx.  07-07-2023 stable. On qid oxycodone 10 mg. Do not give any prn or IV opiates. Pt not interested in weaning off opiates. he is aware he will not get opiate therapy at SNF.  Septic discitis of lumbar region 07-02-2023 TRH assumed care on 07-01-2024. Pt has spent the last 18 prior days on PCCM service. Transesophageal Echo negative for endocarditis. ID states pt will need 8 weeks of IV Vanco. Start date is 06-15-2023.  So end date of therapy is Aug 09, 2023   07-03-2023  continue with IV Zosyn thru 07-07-2023 and IV Vanco through Aug 09, 2023. End date of IV vanco is 08-09-2023.  CM looking into SNF for IV ABX. Pt turned down by CIR.  07-04-2023  continue with IV Zosyn thru 07-07-2023 and IV Vanco through Aug 09, 2023. End date of IV vanco is 08-09-2023.  CM looking into SNF for IV ABX. Pt turned down by CIR.  (Per ID, If pt goes to snf or stay inpatient, then plan 4 weeks iv abx until 07/13/2023 then finish the rest of the 8 weeks with 2 weeks linezolid until 08/06/23, and then 2 weeks doxycycline).   07-05-2023 remains on IV Zosyn and IV vanco. CM looking into SNF for IV abx and rehab. *update from ID recs. 4 weeks iv abx until 07/13/2023 then finish the rest of the 8 weeks with (2 weeks linezolid until 08/06/23, and then 2 weeks doxycycline)   07-06-2023 suppose to be on IV zosyn through 07-07-2023. Then just single agent IV Vanco. Awaiting to see if [facility] arrange for SNF placement for him.   07-07-2023 today should be last day of IV Zosyn. Then he can just get IV Vanco. Hopefully TOC/CM/SW can find him SNF to take him to complete IV ABX therapy and PT.   Acute  respiratory failure with hypoxia (HCC) 07-02-2023 pt extubated on 06-30-2023. Currently on 2 L/min. Wean to RA if possible.  07-03-2023 continue to wean O2  07-04-2023 wean O2 if possible.  07-05-2023 attempt to wean. Will repeat CXR today  07-06-2023 CXR shows persistent infiltrate. Likely his left over septic emboli.  Ordered chest physiotherapy. Pt has a weak cough. Hopefully chest physiotherapy with help him cough up more.   07-07-2023 trying to get pt percussion/vibration vest therapy to help him mobilize his secretions.  Malnutrition of moderate degree Nutrition Status: Nutrition Problem: Moderate Malnutrition Etiology: chronic illness (polysubstance abuse) Signs/Symptoms: mild fat depletion, mild muscle depletion Interventions: Refer to RD note for recommendations, Tube feeding    Septic embolism (HCC) Present on admission.  Severe sepsis (HCC) Present on admission.   DVT prophylaxis: SCDs Start: 06/13/23 1755  Lovenox   Code Status: Full Code Family Communication: no family at bedside Disposition Plan: SNF vs home Reason for continuing need for hospitalization: remains on IV ABX.  Objective:  Vitals:   07/06/23 2027 07/07/23 0452 07/07/23 0500 07/07/23 1302  BP:  134/73  107/81  Pulse: (!) 102 94  (!) 101  Resp: (!) 24 18  18   Temp:  98 F (36.7 C)  97.9 F (36.6 C)  TempSrc:  Oral  Oral  SpO2: 96% 95%  97%  Weight:   60.8 kg   Height:        Intake/Output Summary (Last 24 hours) at 07/07/2023 1535 Last data filed at 07/07/2023 8295 Gross per 24 hour  Intake 50 ml  Output 1125 ml  Net -1075 ml   Filed Weights   07/05/23 0500 07/06/23 0500 07/07/23 0500  Weight: 63.1 kg 63.3 kg 60.8 kg    Examination:  Physical Exam Vitals and nursing note reviewed.  Constitutional:      General: He is not in acute distress.    Appearance: He is not toxic-appearing or diaphoretic.     Comments: Chronically ill appearing  HENT:     Head: Normocephalic and  atraumatic.  Eyes:     General: No scleral icterus. Cardiovascular:     Rate and Rhythm: Normal rate and regular rhythm.  Pulmonary:     Effort: Pulmonary effort is normal. No respiratory distress.     Breath sounds: Normal breath sounds.     Comments: Weak/Wet cough. No distress Abdominal:     General: Abdomen is flat. There is no distension.     Tenderness: There is no abdominal tenderness.  Musculoskeletal:     Comments: Left UE PICC  Skin:    General: Skin is warm and dry.     Capillary Refill: Capillary refill takes less than 2 seconds.  Neurological:     Mental Status: He is alert and oriented to person, place, and time.    Data Reviewed: I have personally reviewed following labs and imaging studies  CBC: Recent Labs  Lab 07/01/23 0413 07/02/23 0448 07/03/23 0407 07/04/23 0006  WBC 13.9* 15.4* 15.2* 13.2*  NEUTROABS 10.0* 11.8* 10.8* 8.9*  HGB 8.3* 8.6* 8.5* 8.0*  HCT 26.4* 27.8* 28.0* 25.7*  MCV 88.0 88.0 89.7 87.7  PLT 388 427* 478* 501*   Basic Metabolic Panel: Recent Labs  Lab 07/01/23 0413 07/02/23 0448 07/03/23 0407 07/04/23 0006 07/07/23 0325  NA 137 137 135 133* 132*  K 3.2* 3.7 3.5 3.3* 3.6  CL 99 101 98 97* 93*  CO2 29 27 29 29 28   GLUCOSE 114* 131* 121* 119* 124*  BUN 35* 25* 24* 23* 24*  CREATININE 1.19 1.08 1.13 1.04 0.92  CALCIUM 7.9* 8.4* 8.3* 8.1* 8.6*  MG 2.1 2.2 2.1  --   --   PHOS 4.6 4.1  --   --   --    GFR: Estimated Creatinine Clearance: 83.5 mL/min (by C-G formula based on SCr of 0.92 mg/dL). Liver Function Tests: Recent Labs  Lab 07/01/23 0413 07/02/23 0448 07/03/23 0407  AST 51* 48* 46*  ALT 48* 49* 47*  ALKPHOS 108 117 110  BILITOT 0.6 0.5 0.2  PROT 8.3* 8.6* 8.3*  ALBUMIN 1.9* 1.9* 1.9*   CBG: Recent Labs  Lab 07/03/23 0719 07/03/23 1154 07/03/23 1656 07/03/23 2026 07/04/23 0716  GLUCAP 127* 135* 120* 88 130*   Recent Results (from the past 240 hours)  Culture, Respiratory w Gram Stain     Status: None    Collection Time: 06/29/23  1:25 PM   Specimen: Tracheal Aspirate; Respiratory  Result Value Ref Range Status   Specimen Description  Final    TRACHEAL ASPIRATE Performed at [facility], [address], [place], [place] [address]    Special Requests   Final    NONE Performed at [facility], [address], [place], [place] [address]    Gram Stain   Final    NO WBC SEEN RARE GRAM POSITIVE COCCI RARE GRAM NEGATIVE RODS    Culture   Final    ABUNDANT METHICILLIN RESISTANT STAPHYLOCOCCUS AUREUS FEW PSEUDOMONAS AERUGINOSA HEALTH DEPARTMENT NOTIFIED Performed at [facility], [address], [place], [place] [address]    Report Status 07/01/2023 FINAL  Final   Organism ID, Bacteria METHICILLIN RESISTANT STAPHYLOCOCCUS AUREUS  Final   Organism ID, Bacteria PSEUDOMONAS AERUGINOSA  Final      Susceptibility   Methicillin resistant staphylococcus aureus - MIC*    CIPROFLOXACIN >=8 RESISTANT Resistant     ERYTHROMYCIN >=8 RESISTANT Resistant     GENTAMICIN <=0.5 SENSITIVE Sensitive     OXACILLIN >=4 RESISTANT Resistant     TETRACYCLINE <=1 SENSITIVE Sensitive     VANCOMYCIN 1 SENSITIVE Sensitive     TRIMETH/SULFA >=320 RESISTANT Resistant     CLINDAMYCIN <=0.25 SENSITIVE Sensitive     RIFAMPIN <=0.5 SENSITIVE Sensitive     Inducible Clindamycin NEGATIVE Sensitive     LINEZOLID 2 SENSITIVE Sensitive     * ABUNDANT METHICILLIN RESISTANT STAPHYLOCOCCUS AUREUS   Pseudomonas aeruginosa - MIC*    CEFTAZIDIME 2 SENSITIVE Sensitive     CIPROFLOXACIN 2 RESISTANT Resistant     GENTAMICIN <=1 SENSITIVE Sensitive     IMIPENEM >=16 RESISTANT Resistant     PIP/TAZO <=4 SENSITIVE Sensitive ug/mL    CEFEPIME 2 SENSITIVE Sensitive     * FEW PSEUDOMONAS AERUGINOSA  Culture, blood (Routine X 2) w Reflex to ID Panel     Status: None   Collection Time: 06/30/23 10:22 AM   Specimen: BLOOD  Result Value Ref Range Status   Specimen Description   Final     BLOOD BLOOD LEFT HAND AEROBIC BOTTLE ONLY Performed at [facility], [address] [place], [place] [address]    Special Requests   Final    BOTTLES DRAWN AEROBIC ONLY Blood Culture results may not be optimal due to an inadequate volume of blood received in culture bottles Performed at [facility], [address] [place], [place] [address]    Culture   Final    NO GROWTH 5 DAYS Performed at [facility], [address] [place], [place] [address]    Report Status 07/05/2023 FINAL  Final  Culture, blood (Routine X 2) w Reflex to ID Panel     Status: None   Collection Time: 06/30/23 10:28 AM   Specimen: BLOOD  Result Value Ref Range Status   Specimen Description   Final    BLOOD BLOOD LEFT HAND AEROBIC BOTTLE ONLY Performed at [facility], [address], [place], [place] [address]    Special Requests   Final    BOTTLES DRAWN AEROBIC ONLY Blood Culture results may not be optimal due to an inadequate volume of blood received in culture bottles Performed at [facility], [address], [place], [place] [address]    Culture   Final    NO GROWTH 5 DAYS Performed at [facility], [address] [place], [place] [address]    Report Status 07/05/2023 FINAL  Final    Scheduled Meds:  carvedilol  6.25 mg Oral BID WC   Chlorhexidine Gluconate Cloth  6 each Topical Daily   chlorpheniramine-HYDROcodone  5 mL Oral Q12H   clonazePAM  1 mg Oral BID   enoxaparin (LOVENOX) injection  60 mg Subcutaneous Q12H   feeding supplement  237 mL Oral TID BM   guaiFENesin  20 mL Oral BID   lidocaine  1 patch Transdermal Q24H   multivitamin with minerals  1 tablet Oral Daily   oxyCODONE  10 mg Oral QID   pantoprazole  40 mg Oral Daily   polyethylene glycol  17 g Oral Daily   senna-docusate  2 tablet Oral BID   sodium chloride flush  10-40 mL Intracatheter Q12H   thiamine  100 mg Oral Daily   Continuous  Infusions:  piperacillin-tazobactam (ZOSYN)  IV 3.375 g (07/07/23 1455)   vancomycin 1,250 mg (07/07/23 1156)     LOS: 24 days   Time spent: 45 minutes  Carollee Herter, DO  [facility]  07/07/2023, 3:35 PM

## 2023-07-07 NOTE — Plan of Care (Signed)

## 2023-07-07 NOTE — Progress Notes (Signed)
 Physical Therapy Treatment Patient Details Name: Brendan Preston MRN: [id number] DOB: Aug 29, 1975 Today's Date: 07/07/2023   History of Present Illness 89 yoM admitted 06/13/23 with AMS, fever, tachycardic, normo to slightly hypertensive . Patient intubated 3/13-3/24.PMH: polysubstance abuse    PT Comments  Pt agreeable to therapy. Pt comes to sitting EOB with increased time and effort, using bedrails to assist self, comes to semi sidelying position for comfort, supv. Pt completes STS reps from EOB with RW and min A, pushing from braced RW to assist in powering up, verbal cues for LE activation and extension to assist in powering up, able to stand with UE support for ~1 minute increments. Pt completes step pivot to recliner at bedside with RW, min A. Pt tolerates seated BLE strengthening exercises without complaints, full AROM noted and good muscle activation. Pt reports back pain 8/10 and requesting pain medication- notified RN. Pt declines amb due to fatigue and back pai but tolerates remaining up in recliner at EOS.   If plan is discharge home, recommend the following: Assistance with cooking/housework;Help with stairs or ramp for entrance;Assist for transportation;A little help with walking and/or transfers;A little help with bathing/dressing/bathroom   Can travel by private vehicle     No  Equipment Recommendations  Rolling walker (2 wheels)    Recommendations for Other Services       Precautions / Restrictions Precautions Precautions: Fall Recall of Precautions/Restrictions: Intact Restrictions Weight Bearing Restrictions Per Provider Order: No     Mobility  Bed Mobility Overal bed mobility: Needs Assistance Bed Mobility: Supine to Sit     Supine to sit: Supervision, Used rails     General bed mobility comments: increased time and effort, using bedrails to assist self into sitting, verbal cues for hand placement and sequencing    Transfers Overall transfer level: Needs  assistance Equipment used: Rolling walker (2 wheels) Transfers: Sit to/from Stand, Bed to chair/wheelchair/BSC Sit to Stand: Min assist   Step pivot transfers: Min assist       General transfer comment: STS reps from recliner with min A to power up, tolerates static supported standing for ~1 minute, step pivot to recliner at bedside with RW and min A    Ambulation/Gait               General Gait Details: pt declines   Optometrist     Tilt Bed    Modified Rankin (Stroke Patients Only)       Balance Overall balance assessment: Needs assistance Sitting-balance support: Feet supported Sitting balance-Leahy Scale: Fair Sitting balance - Comments: close supv with static sitting EOB, no LOB in any direction, able to sit without UE support   Standing balance support: Reliant on assistive device for balance, During functional activity, Bilateral upper extremity supported Standing balance-Leahy Scale: Poor                              Communication Communication Communication: No apparent difficulties  Cognition Arousal: Alert Behavior During Therapy: WFL for tasks assessed/performed   PT - Cognitive impairments: No apparent impairments                       PT - Cognition Comments: pt verbalizing frustrations with current functional status, declines chaplain consult Following commands: Intact      Cueing    Exercises General Exercises -  Lower Extremity Long Arc Quad: Seated, AROM, Strengthening, Both, 15 reps Hip Flexion/Marching: Seated, AROM, Strengthening, Both, 15 reps Other Exercises Other Exercises: STS 3 reps from EOB with RW and BUE assisting, min A    General Comments        Pertinent Vitals/Pain Pain Assessment Pain Assessment: Faces Pain Score: 8  Pain Location: back Pain Descriptors / Indicators: Aching, Sore Pain Intervention(s): Limited activity within patient's tolerance, Monitored  during session, Repositioned, Patient requesting pain meds-RN notified    Home Living                          Prior Function            PT Goals (current goals can now be found in the care plan section) Acute Rehab PT Goals Patient Stated Goal: to get up PT Goal Formulation: With patient Time For Goal Achievement: 07/15/23 Potential to Achieve Goals: Good Progress towards PT goals: Progressing toward goals    Frequency    Min 2X/week      PT Plan      Co-evaluation              AM-PAC PT "6 Clicks" Mobility   Outcome Measure  Help needed turning from your back to your side while in a flat bed without using bedrails?: A Little Help needed moving from lying on your back to sitting on the side of a flat bed without using bedrails?: A Little Help needed moving to and from a bed to a chair (including a wheelchair)?: A Little Help needed standing up from a chair using your arms (e.g., wheelchair or bedside chair)?: A Little Help needed to walk in hospital room?: A Lot Help needed climbing 3-5 steps with a railing? : Total 6 Click Score: 15    End of Session Equipment Utilized During Treatment: Gait belt Activity Tolerance: Patient limited by fatigue Patient left: in chair;with call bell/phone within reach;with chair alarm set Nurse Communication: Mobility status;Patient requests pain meds PT Visit Diagnosis: Unsteadiness on feet (R26.81);Muscle weakness (generalized) (M62.81);Difficulty in walking, not elsewhere classified (R26.2);Other abnormalities of gait and mobility (R26.89)     Time: 2956-2130 PT Time Calculation (min) (ACUTE ONLY): 41 min  Charges:    $Therapeutic Exercise: 8-22 mins $Therapeutic Activity: 23-37 mins PT General Charges $$ ACUTE PT VISIT: 1 Visit                     Tori Allante Beane PT, DPT 07/07/23, 12:58 PM

## 2023-07-07 NOTE — Progress Notes (Addendum)
   07/07/23 2012  Chest Physiotherapy Tx  CPT Delivery Source Flutter valve  $ Expiratory Vibration Device Administration  Subsequent  CPT Duration 5  CPT Chest Site Full range  Post-Treatment Respirations 20  Cough Non-productive  Sputum Amount None  Position Supine  CPT Treatment Tolerance Tolerated fairly well  Post-Treatment Pulse 96  Cough Non productive  Respiratory  Bilateral Breath Sounds Diminished  R Upper  Breath Sounds Diminished  L Upper Breath Sounds Diminished  R Lower Breath Sounds Diminished  L Lower Breath Sounds Diminished   Pt refused to do chest vest therapy tonight.  Pt stated he is in too much pain.  RN notified.  Pt did flutter valve instead x5 only due to pain.

## 2023-07-07 NOTE — Progress Notes (Signed)
 Pharmacy Antibiotic Note  Brendan Preston is a 48 y.o. male admitted on 06/13/2023 with bacteremia.  Pharmacy has been consulted for Vancomycin + Zosyn dosing.  The patient had a peak/trough drawn today that resulted as 46/10 mcg/ml respectively for a calculated AUC of 553.4, Cmax 50.9, Cmin 7.5;   Plan: -  Starting 4/1 at 08 AM, adjust to Vancomycin 500 mg IV every 12 hours (eAUC 442.6 VT 10.8) - Continue Zosyn 3.375g IV every 8 hours - 7d course EOT 4/1 - Will continue to follow renal function, culture results, LOT, and antibiotic de-escalation plans   Height: 5\' 11"  (180.3 cm) Weight: 60.8 kg (134 lb 0.6 oz) IBW/kg (Calculated) : 75.3  Temp (24hrs), Avg:98.3 F (36.8 C), Min:97.9 F (36.6 C), Max:99 F (37.2 C)  Recent Labs  Lab 07/01/23 0413 07/02/23 0448 07/03/23 0407 07/04/23 0006 07/04/23 0932 07/07/23 0325 07/07/23 0830 07/07/23 1422  WBC 13.9* 15.4* 15.2* 13.2*  --   --   --   --   CREATININE 1.19 1.08 1.13 1.04  --  0.92  --   --   VANCOTROUGH  --   --   --   --  11*  --  10*  --   VANCOPEAK  --   --   --  20*  --   --   --  46*    Estimated Creatinine Clearance: 83.5 mL/min (by C-G formula based on SCr of 0.92 mg/dL).    Allergies  Allergen Reactions   Cefepime Rash    Antimicrobials this admission: Vancomycin 3/7 >>  Cefepime 3/7 x 1 Ceftriaxone 3/8 >> 3/9 Meropenem 3/17 >>3/19 Cipro 3/19 >>3/25 3/25 zosyn >> 4/1  Dose adjustments this admission: 3/8 VR 17 mcg/ml 3/13 VP/VT 26/18 mcg/ml - AUC 543 3/17 VP/VT 31/18 mcg/ml - AUC 600 3/21 VP/VT 33/17 mcg/ml - AUC 618 - reduce to 500 mg/12h (eAUC 412, VT 11) 3/24 VT 20 mcg/ml (anticipated 11 mcg/ml), SCr rising 3/28: VP/VT 20/11 mcg/ml, AUC 396 on 500 mg/12h >> adjust to 1250 mg/24h (eAUC 495, VT ~10) 3/31 VP/VT 46/10 mcg/mi. AUC 553.4 on 1250 q24>> adjust to 500 q12 (eAUC 442.6, VT 10.6)   Microbiology results: 3/7 BCx 4/4 MRSA 3/9 BCx >> NGf 3/12 RCx (TA) >> MRSA 3/16 RCx (TA) >> staph aureus +  PsA  Thank you for allowing pharmacy to be a part of this patient's care.  Herby Abraham, Pharm.D Use secure chat for questions 07/07/2023 3:46 PM

## 2023-07-07 NOTE — TOC Progression Note (Addendum)
 Transition of Care [facility]) - Progression Note    Patient Details  Name: Brendan Preston MRN: [id number] Date of Birth: 07-27-1975  [facility] of [facility] CM/SW Contact  Otelia Santee, LCSW Phone Number: 07/07/2023, 12:21 PM  Clinical Narrative:    No bed offers for SNF placement. [facility] to reach out to their business office to determine if they could accept an LOG. Awaiting determination.   ADDENDUM: [facility] unable to accept with LOG.     Barriers to Discharge: Continued Medical Work up  Expected Discharge Plan and Services     Post Acute Care Choice: NA Living arrangements for the past 2 months: Single Family Home                   DME Agency: NA                   Social Determinants of Health (SDOH) Interventions SDOH Screenings   Food Insecurity: Patient Declined (06/15/2023)  Housing: Patient Unable To Answer (06/29/2023)  Transportation Needs: Patient Unable To Answer (06/16/2023)  Utilities: Patient Unable To Answer (06/16/2023)  Tobacco Use: High Risk (06/29/2023)    Readmission Risk Interventions    06/14/2023    8:46 AM  Readmission Risk Prevention Plan  Post Dischage Appt Complete  Medication Screening Complete  Transportation Screening Complete

## 2023-07-07 NOTE — Plan of Care (Signed)
  Problem: Clinical Measurements: Goal: Diagnostic test results will improve Outcome: Progressing Goal: Respiratory complications will improve Outcome: Progressing Goal: Cardiovascular complication will be avoided Outcome: Progressing   Problem: Coping: Goal: Level of anxiety will decrease Outcome: Progressing   Problem: Pain Managment: Goal: General experience of comfort will improve and/or be controlled Outcome: Progressing   Problem: Safety: Goal: Ability to remain free from injury will improve Outcome: Progressing

## 2023-07-07 NOTE — Progress Notes (Signed)
 Patient refused shower, he sat in the recliner 20 minutes after working with PT, and requested to go back to bed.He was very weak during transfer. Will continue to encourage mobility and self care.

## 2023-07-08 LAB — COMPREHENSIVE METABOLIC PANEL WITH GFR
ALT: 45 U/L — ABNORMAL HIGH (ref 0–44)
AST: 29 U/L (ref 15–41)
Albumin: 2 g/dL — ABNORMAL LOW (ref 3.5–5.0)
Alkaline Phosphatase: 94 U/L (ref 38–126)
Anion gap: 10 (ref 5–15)
BUN: 28 mg/dL — ABNORMAL HIGH (ref 6–20)
CO2: 28 mmol/L (ref 22–32)
Calcium: 8.7 mg/dL — ABNORMAL LOW (ref 8.9–10.3)
Chloride: 95 mmol/L — ABNORMAL LOW (ref 98–111)
Creatinine, Ser: 1.11 mg/dL (ref 0.61–1.24)
GFR, Estimated: >60 mL/min (ref >60)
Glucose, Bld: 121 mg/dL — ABNORMAL HIGH (ref 70–99)
Potassium: 3.8 mmol/L (ref 3.5–5.1)
Sodium: 133 mmol/L — ABNORMAL LOW (ref 135–145)
Total Bilirubin: 0.3 mg/dL (ref 0.0–1.2)
Total Protein: 8.9 g/dL — ABNORMAL HIGH (ref 6.5–8.1)

## 2023-07-08 LAB — CBC
HCT: 26.3 % — ABNORMAL LOW (ref 39.0–52.0)
Hemoglobin: 8.2 g/dL — ABNORMAL LOW (ref 13.0–17.0)
MCH: 27.3 pg (ref 26.0–34.0)
MCHC: 31.2 g/dL (ref 30.0–36.0)
MCV: 87.7 fL (ref 80.0–100.0)
Platelets: 764 10*3/uL — ABNORMAL HIGH (ref 150–400)
RBC: 3 MIL/uL — ABNORMAL LOW (ref 4.22–5.81)
RDW: 16.3 % — ABNORMAL HIGH (ref 11.5–15.5)
WBC: 20.2 10*3/uL — ABNORMAL HIGH (ref 4.0–10.5)
nRBC: 0 % (ref 0.0–0.2)

## 2023-07-08 LAB — C-REACTIVE PROTEIN: CRP: 11.8 mg/dL — ABNORMAL HIGH (ref <1.0)

## 2023-07-08 NOTE — Progress Notes (Addendum)
       Overnight   NAME: Brendan Preston MRN: [id number] DOB : 1975-04-26    Date of Service   07/08/2023   HPI/Events of Note    Notified by RN for suspicious behavior immediately following visitation.  48 yo with pmhx of polysubstance abuse (heroin/fentanyl) - Admitted through ER via EMS for AMS THC and Opiates (+) on admission.   Is receiving Oxycodone inpatient.Last dose 1618 hrs on 07/08/23.  Immediately after a visitation , RNs report a change in status to include drowsiness and slight vital sign changes not present during pre-visitation assessments. Vital signs are at baseline with the exception of a mild tachycardia   Patient does awake and interact.    Interventions/ Plan   Tele-sitter ordered Continue all Attending orders.      Chinita Greenland BSN MSNA MSN ACNPC-AG Acute Care Nurse Practitioner [facility]

## 2023-07-08 NOTE — Progress Notes (Addendum)
 PT refused chest vest for 1600 round. PT states he uses the Flutter valve. PT encouraged to demonstrate hands on understanding- PT did not seem interested at this time.

## 2023-07-08 NOTE — Progress Notes (Signed)
 Pt refuses chest vest has been coached on the flutter valve.

## 2023-07-08 NOTE — Plan of Care (Signed)

## 2023-07-08 NOTE — Progress Notes (Signed)
 Occupational Therapy Treatment Patient Details Name: Brendan Preston MRN: [id number] DOB: Nov 05, 1975 Today's Date: 07/08/2023   History of present illness 48 yr old male admitted 06/13/23 with AMS and fever. Patient intubated 3/13-3/24.PMH: polysubstance abuse   OT comments  The pt was seen for functional strengthening, progression of functional activity, and ADL instruction. He was assisted into sitting EOB, where he was instructed on simple therapeutic exercises for strengthening needed to facilitate progression ADL performance. He performed 10 reps and 1 set of exercises with SBA, demonstrating fair+ tolerance. He further performed upper body grooming and partial upper body bathing with SBA, though needing verbal cues to correct occasional posterior lean. He gently deferred attempts to stand & progress out of bed activity, given reports of increased fatigue with activity and feelings of weakness. He also reported R flank and chest pain. Continue OT plan of care. Patient will benefit from continued inpatient follow up therapy, <3 hours/day.        If plan is discharge home, recommend the following:  A lot of help with walking and/or transfers;Assistance with cooking/housework;Assist for transportation;Help with stairs or ramp for entrance;A lot of help with bathing/dressing/bathroom   Equipment Recommendations  Other (comment) (to be determined pending progress at next level of care)    Recommendations for Other Services      Precautions / Restrictions Precautions Precautions: Fall Restrictions Weight Bearing Restrictions Per Provider Order: No Other Position/Activity Restrictions: Contact precautions       Mobility Bed Mobility Overal bed mobility: Needs Assistance Bed Mobility: Supine to Sit, Sit to Supine     Supine to sit: Supervision, HOB elevated, Used rails Sit to supine: Supervision, HOB elevated, Used rails   General bed mobility comments: increased time and effort,  using bedrails to assist self into sitting    Transfers    General transfer comment: The pt deferred standing attempt, reporting fatigue and activity and generalized weakness     Balance     Sitting balance-Leahy Scale: Fair         ADL either performed or assessed with clinical judgement   ADL Overall ADL's : Needs assistance/impaired     Grooming: Sitting;Wash/dry hands;Wash/dry face Grooming Details (indicate cue type and reason): The pt performed face and hand washing seated EOB, requiring intermittent verbal cues to correct posterior lean, as well as slightly increased time for tasks, given fatigue.              Communication Communication Communication: No apparent difficulties   Cognition Arousal: Alert Behavior During Therapy: WFL for tasks assessed/performed Cognition: No apparent impairments      Following commands: Intact                      Pertinent Vitals/ Pain       Pain Assessment Pain Assessment: Faces Pain Score: 4  Pain Location: Chest and R flank Pain Intervention(s): Limited activity within patient's tolerance, Monitored during session, Repositioned         Frequency  Min 2X/week        Progress Toward Goals  OT Goals(current goals can now be found in the care plan section)  Progress towards OT goals: Progressing toward goals  Acute Rehab OT Goals Patient Stated Goal: to get stronger OT Goal Formulation: With patient Potential to Achieve Goals: Good  Plan         AM-PAC OT "6 Clicks" Daily Activity     Outcome Measure   Help from another person eating  meals?: None Help from another person taking care of personal grooming?: A Little Help from another person toileting, which includes using toliet, bedpan, or urinal?: A Lot Help from another person bathing (including washing, rinsing, drying)?: A Lot Help from another person to put on and taking off regular upper body clothing?: A Little Help from another person to  put on and taking off regular lower body clothing?: A Lot 6 Click Score: 16    End of Session Equipment Utilized During Treatment: Oxygen  OT Visit Diagnosis: Pain;Muscle weakness (generalized) (M62.81)   Activity Tolerance Patient limited by fatigue   Patient Left in bed;with call bell/phone within reach;with bed alarm set   Nurse Communication          Time: 4098-1191 OT Time Calculation (min): 23 min  Charges: OT General Charges $OT Visit: 1 Visit OT Treatments $Self Care/Home Management : 8-22 mins $Therapeutic Activity: 8-22 mins     Reuben Likes, OTR/L 07/08/2023, 1:54 PM

## 2023-07-08 NOTE — Progress Notes (Signed)
 Heart Failure Navigator Progress Note  Assessed for Heart & Vascular TOC clinic readiness.  Patient does not meet criteria due to per MD note patient will need to discharge home with his parents to [place]. Currently refusing some care. No HF TOC. .   Navigator will sign off at this time.   Rhae Hammock, BSN, Scientist, clinical (histocompatibility and immunogenetics) Only

## 2023-07-08 NOTE — Progress Notes (Signed)
 PROGRESS NOTE    Brendan Preston  ZOX:[id number] DOB: 1975-06-22 DOA: 06/13/2023 PCP: Patient, No Pcp Per  Subjective: Pt seen and examined. Pt refused chest physiotherapy with vibration vest. Pt refused to get into shower yesterday stating he was too tired and his back hurts. Pt states he wants to do something and when arrangements made for him to complete task, he refuses care.   Hospital Course: HPI: Pt encephalopathic, therefore HPI obtained from chart review.  See additional chart w/MRN [id number]   1 yoM with prior hx of known polysubstance (heroin/ fentanyl) abuse in which EMS was called out for AMS, last seen awake 3 days ago covered in urine and feces.    In ER, temp 104.3 rectal, tachycardic, normo to slightly hypertensive, sats 100% on NRB> but maintaining sats on RA with GCS ~10 but protecting airway.  Labs significant for lactic 8.4, WBC 15, plts 96, VBG 7.29/ 44,  CXR neg, RVP neg, EKG without ST changes, qtc borderline, CTH official read pending, trop hs 694, INR 1.3.  Foley placed- urine cloudy, glucose 150, Hgb 150 (-RBC), neg leuks/nitrates, WBC 6-10, UDS positive for THC and opiates.  CMET and other labs had to be redrawn and still awaiting due to hemolysis.  Cultures sent, empiric vanc/ cefepime given, 2L LR.  PCCM called for admit.  Significant Events: Admitted 06/13/2023 for severe sepsis 3/7 admit, encephalopathic, altered, drug rash developed after cefepime. BLOOD UCLURE MRSA 3/8 TTE negative for endocarditis 3/10 he had a TEE scheduled but sounds like he has declined this . Larey Seat out of bed late day shift and broke his nose. Bcx still pos. 3/11 Tubed in afternoon. CRP high.  3/12 changing his pain reg/ PAD 3/13 on vent 3/14 CT abdomen showed left pneumothorax, bilateral lower lobe consolidation, multiple cavitary lesions, suspicious for bilateral common iliac DVT, fluid collection along L4/L5 vertebral bodies. 3/14 left pigtail chest tube for pneumothorax 3/15 started  on IV heparin, venous duplex negative, MRI lumbar spine suggests L5 osteo/no epidural abscess 3/17 changing IV heparin to LMWH as heparin levels not detectable. Still having marked trach secretions. Growing PA so added meropenem (given his rash w/ cefepime). Changed fent to dilaudid at 2mg /hr. CT clamped.  3/18 hgb down to 6.6 got 1 unit PRBC. No obvious bleeding source. Was >13 liters positive so also starting diuretics. Dec'd dilaudid dose to 1.5mg /hr. no PTX so CT removed.  3/19 tolerated 7 hours PSV 10/5 but had quite a lot of secretions requiring frequent suctioning CHES TTUBE REMOVED 3/21 - Temp 99.5. On 30% FiO2. Diuresed well overnight, 5L past 24 hours but still +8L net since admit. 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 On and off low grade fever 101F. On vent 30% fio2/ Per RN and patient - c/p severe cough an donly lidocaine neb helps. C/oc copious resp secretions. PER RN - due to copious secretions patient heading for trach 3/24 extubated 07-02-2023 [facility] transferred to [facility] 07-03-2023 Pt turned down by CIR. 07-07-2023 updated ID recs:4 weeks iv abx until 07/13/2023 then finish the rest of the 8 weeks with (2 weeks linezolid until 08/06/23, and then 2 weeks doxycycline)   Antibiotic Therapy: Anti-infectives (From admission, onward)    Start     Dose/Rate Route Frequency  Ordered Stop   07/01/23 1515  piperacillin-tazobactam (ZOSYN) IVPB 3.375 g        3.375 g 12.5 mL/hr over 240 Minutes Intravenous Every 8 hours 07/01/23 1415     06/30/23 1400  ciprofloxacin (CIPRO) IVPB 400 mg  Status:  Discontinued        400 mg 200 mL/hr over 60 Minutes Intravenous Every 8 hours 06/30/23 1224 07/01/23 1415   06/27/23 2200   vancomycin (VANCOREADY) IVPB 500 mg/100 mL  Status:  Discontinued        500 mg 100 mL/hr over 60 Minutes Intravenous Every 12 hours 06/27/23 1140 06/27/23 1619   06/27/23 2200  vancomycin (VANCOREADY) IVPB 500 mg/100 mL  Status:  Discontinued        500 mg 100 mL/hr over 60 Minutes Intravenous Every 12 hours 06/27/23 1619 06/27/23 1619   06/27/23 2200  vancomycin (VANCOCIN) 500 mg in sodium chloride 0.9 % 100 mL IVPB        500 mg 100 mL/hr over 60 Minutes Intravenous Every 12 hours 06/27/23 1623     06/25/23 1400  ciprofloxacin (CIPRO) IVPB 400 mg        400 mg 200 mL/hr over 60 Minutes Intravenous Every 8 hours 06/25/23 0748 06/29/23 2329   06/23/23 2200  meropenem (MERREM) 1 g in sodium chloride 0.9 % 100 mL IVPB  Status:  Discontinued        1 g 200 mL/hr over 30 Minutes Intravenous Every 8 hours 06/23/23 1108 06/25/23 0748   06/23/23 1130  meropenem (MERREM) 1 g in sodium chloride 0.9 % 100 mL IVPB        1 g 200 mL/hr over 30 Minutes Intravenous  Once 06/23/23 1057 06/23/23 1140   06/17/23 1000  vancomycin (VANCOREADY) IVPB 1250 mg/250 mL  Status:  Discontinued        1,250 mg 166.7 mL/hr over 90 Minutes Intravenous Every 24 hours 06/17/23 0709 06/17/23 0818   06/17/23 0900  vancomycin (VANCOREADY) IVPB 750 mg/150 mL  Status:  Discontinued        750 mg 150 mL/hr over 60 Minutes Intravenous Every 12 hours 06/17/23 0818 06/27/23 1140   06/16/23 1030  vancomycin (VANCOCIN) IVPB 1000 mg/200 mL premix  Status:  Discontinued        1,000 mg 200 mL/hr over 60 Minutes Intravenous Every 24 hours 06/16/23 1022 06/17/23 0709   06/15/23 1345  cefTRIAXone (ROCEPHIN) 2 g in sodium chloride 0.9 % 100 mL IVPB  Status:  Discontinued        2 g 200 mL/hr over 30 Minutes Intravenous Every 24 hours 06/15/23 1249 06/15/23 2226   06/14/23 1000  vancomycin (VANCOREADY) IVPB 750 mg/150 mL  Status:  Discontinued        750 mg 150 mL/hr over 60 Minutes Intravenous Every 24 hours 06/14/23 0852 06/16/23  1022   06/13/23 1940  cefTRIAXone (ROCEPHIN) 2 g in sodium chloride 0.9 % 100 mL IVPB  Status:  Discontinued        2 g 200 mL/hr over 30 Minutes Intravenous 2 times daily 06/13/23 1940 06/14/23 0802   06/13/23 1830  cefTRIAXone (ROCEPHIN) 2 g in sodium chloride 0.9 % 100 mL IVPB  Status:  Discontinued        2 g 200 mL/hr over 30 Minutes Intravenous Every 24 hours 06/13/23 1803 06/13/23 1940   06/13/23 1445  vancomycin (VANCOCIN) 500 mg in sodium chloride 0.9 % 100 mL IVPB  500 mg 100 mL/hr over 60 Minutes Intravenous  Once 06/13/23 1430 06/13/23 1627   06/13/23 1430  vancomycin (VANCOREADY) IVPB 500 mg/100 mL  Status:  Discontinued        500 mg 100 mL/hr over 60 Minutes Intravenous  Once 06/13/23 1426 06/13/23 1430   06/13/23 1400  vancomycin (VANCOCIN) IVPB 1000 mg/200 mL premix        1,000 mg 200 mL/hr over 60 Minutes Intravenous  Once 06/13/23 1354 06/13/23 1527   06/13/23 1345  ceFEPIme (MAXIPIME) 2 g in sodium chloride 0.9 % 100 mL IVPB        2 g 200 mL/hr over 30 Minutes Intravenous  Once 06/13/23 1335 06/13/23 1449       Procedures: 07-04-2023 RUE PICC removed. Single lumen Left UE PICC placed  Consultants: PCCM Infectious Disease    Assessment and Plan: * MRSA bacteremia 07-02-2023 TRH assumed care on 07-01-2024. Pt has spent the last 18 prior days on PCCM service. Transesophageal Echo negative for endocarditis. ID states pt will need 8 weeks of IV Vanco. Start date is 06-15-2023.  So end date of therapy is Aug 09, 2023  07-03-2023  continue with IV Zosyn thru 07-07-2023 and IV Vanco through Aug 09, 2023. End date of IV vanco is 08-09-2023.  CM looking into SNF for IV ABX. Pt turned down by CIR. Started on low dose coreg to help with his tachycardia. Continue to monitor.  07-04-2023  continue with IV Zosyn thru 07-07-2023 and IV Vanco through Aug 09, 2023. End date of IV vanco is 08-09-2023.  CM looking into SNF for IV ABX. Pt turned down by CIR.  (Per ID, If pt goes to  snf or stay inpatient, then plan 4 weeks iv abx until 07/13/2023 then finish the rest of the 8 weeks with 2 weeks linezolid until 08/06/23, and then 2 weeks doxycycline).   07-05-2023 remains on IV Zosyn and IV vanco. CM looking into SNF for IV abx and rehab.  07-06-2023 suppose to be on IV zosyn through 07-07-2023. Then just single agent IV Vanco. Awaiting to see if [facility] can arrange for SNF placement for him.  07-07-2023 today should be last day of IV Zosyn. Then he can just get IV Vanco. Hopefully TOC/CM/SW can find him SNF to take him to complete IV ABX therapy and PT. *update from ID recs. 4 weeks iv abx until 07/13/2023 then finish the rest of the 8 weeks with (2 weeks linezolid until 08/06/23, and then 2 weeks doxycycline)   07-08-2023 now on single agent IV vanco until 07-13-2023. He can then get 14 days of zyvox 600 mgm bid(through 08-06-2023) and then 14 days of doxycycline 200 mg bid.  Sinus tachycardia 07-04-2023 likely due to severe deconditioning. Improved with coreg 6.25 mg bid.  07-05-2023 resolved with coreg 6.25 mg bid  07-06-2023 HR/BP stable on coreg 6.25 mg bid.  07-06-2023 stable on coreg 6.25 mg bid.  07-08-2023 stable on coreg 6.25 mg bid.  Critical illness myopathy 07-03-2023 pt turned down by inpatient rehab. Pt is uninsured. CM will attempt SNF placement with potential hospital guarantee. Continue with PT/OT.  07-04-2023 CM still looking for SNF bed.  07-05-2023 CM still looking for SNF bed. CIR declined to accept pt.  07-06-2023 CM still looking for SNF bed.  07-07-2023 CM still looking for SNF bed.  07-08-2023 TOC still looking for SNF bed.  IVDU (intravenous drug user) 07-02-2023 pt states has used IV drugs in the past. But has been  sniffing heroin for the last 10 years.Discussed his long history of drug abuse. Pt states he was on suboxone in the past. He wants to try and get back on suboxone.  Discussed that his oxycodone dose would not be increased while he is  in the hospital. And that he will not be given anymore IV opiates. Discussed with him that it would be best for him to get on suboxone therapy as soon as possible but he would need to stop all opiate therapy for 24-48 hours prior to starting suboxone. He states he will "think about it".  07-03-2023 pt unwilling to change to suboxone. Do NOT increase or give extra doses of oxycodone. Do NOT give any IV opiates/benzos. Start oxycodone taper next week.  07-05-2023 remains on oxycodone QID 10 mg. Pt not willing to try suboxone while in the hospital.  07-06-2023 continue qid oxycodone 10 mg. Do Not give PRN or IV opiates. Do not change his opiate Rx.  07-07-2023 stable. On qid oxycodone 10 mg. Do not give any prn or IV opiates. Pt not interested in weaning off opiates. he is aware he will not get opiate therapy at SNF.  07-08-2023 early this week, pt stated he wanted to get off opiates. Offered to change him to suboxone while he was in the hospital. Only for patient to later state he did not want to change to suboxone. Pt displaying passive/aggressive behavior by stating he wants one therapy only to refuse to participate when offered to him later.(I.e. pt taking a shower, pt wearing chest physiotherapy vest to mobilize secretions).  When his IV vanco ends, opiate tapering should start.  Septic discitis of lumbar region 07-02-2023 TRH assumed care on 07-01-2024. Pt has spent the last 18 prior days on PCCM service. Transesophageal Echo negative for endocarditis. ID states pt will need 8 weeks of IV Vanco. Start date is 06-15-2023.  So end date of therapy is Aug 09, 2023   07-03-2023  continue with IV Zosyn thru 07-07-2023 and IV Vanco through Aug 09, 2023. End date of IV vanco is 08-09-2023.  CM looking into SNF for IV ABX. Pt turned down by CIR.  07-04-2023  continue with IV Zosyn thru 07-07-2023 and IV Vanco through Aug 09, 2023. End date of IV vanco is 08-09-2023.  CM looking into SNF for IV ABX. Pt turned down by  CIR.  (Per ID, If pt goes to snf or stay inpatient, then plan 4 weeks iv abx until 07/13/2023 then finish the rest of the 8 weeks with 2 weeks linezolid until 08/06/23, and then 2 weeks doxycycline).   07-05-2023 remains on IV Zosyn and IV vanco. CM looking into SNF for IV abx and rehab. *update from ID recs. 4 weeks iv abx until 07/13/2023 then finish the rest of the 8 weeks with (2 weeks linezolid until 08/06/23, and then 2 weeks doxycycline)   07-06-2023 suppose to be on IV zosyn through 07-07-2023. Then just single agent IV Vanco. Awaiting to see if [facility] can arrange for SNF placement for him.   07-07-2023 today should be last day of IV Zosyn. Then he can just get IV Vanco. Hopefully TOC/CM/SW can find him SNF to take him to complete IV ABX therapy and PT.   07-08-2023 now on single agent IV vanco until 07-13-2023. He can then get 14 days of zyvox 600 mgm bid(through 08-06-2023) and then 14 days of doxycycline 200 mg bid.   Acute respiratory failure with hypoxia (HCC) 07-02-2023 pt extubated on  06-30-2023. Currently on 2 L/min. Wean to RA if possible.  07-03-2023 continue to wean O2  07-04-2023 wean O2 if possible.  07-05-2023 attempt to wean. Will repeat CXR today  07-06-2023 CXR shows persistent infiltrate. Likely his left over septic emboli.  Ordered chest physiotherapy. Pt has a weak cough. Hopefully chest physiotherapy with help him cough up more.   07-07-2023 trying to get pt percussion/vibration vest therapy to help him mobilize his secretions.  07-08-2023 arrangement made to help mobilize his secretions after pt agreed to wear vest. Only for pt to refuse treatment when RT brought in machine.  Malnutrition of moderate degree Nutrition Status: Nutrition Problem: Moderate Malnutrition Etiology: chronic illness (polysubstance abuse) Signs/Symptoms: mild fat depletion, mild muscle depletion Interventions: Refer to RD note for recommendations, Tube feeding    Septic embolism  (HCC) Present on admission.  Severe sepsis (HCC) Present on admission.  DVT prophylaxis: SCDs Start: 06/13/23 1755  Lovenox   Code Status: Full Code Family Communication: no family at bedside Disposition Plan: no SNF bed offers. Pt will need to be discharge home with his parents Reason for continuing need for hospitalization: remains on IV Abx.  Objective: Vitals:   07/07/23 2006 07/08/23 0500 07/08/23 0514 07/08/23 0909  BP: 135/86  113/73 113/73  Pulse: (!) 103  (!) 102 (!) 102  Resp: 19  19   Temp: 98.9 F (37.2 C)  99.5 F (37.5 C)   TempSrc: Oral  Oral   SpO2: 97%  91%   Weight:  60.9 kg    Height:        Intake/Output Summary (Last 24 hours) at 07/08/2023 1350 Last data filed at 07/08/2023 1100 Gross per 24 hour  Intake 239.69 ml  Output 1025 ml  Net -785.31 ml   Filed Weights   07/06/23 0500 07/07/23 0500 07/08/23 0500  Weight: 63.3 kg 60.8 kg 60.9 kg    Examination:  Physical Exam Vitals and nursing note reviewed.  Constitutional:      General: He is not in acute distress.    Appearance: He is not toxic-appearing or diaphoretic.     Comments: Chronically ill appearing  HENT:     Head: Normocephalic and atraumatic.  Eyes:     General: No scleral icterus. Cardiovascular:     Rate and Rhythm: Normal rate and regular rhythm.  Pulmonary:     Effort: Pulmonary effort is normal.     Breath sounds: Normal breath sounds.  Abdominal:     General: Bowel sounds are normal. There is no distension.     Palpations: Abdomen is soft.  Musculoskeletal:     Comments: Left UE PICC line  Skin:    General: Skin is warm and dry.     Capillary Refill: Capillary refill takes less than 2 seconds.  Neurological:     Mental Status: He is oriented to person, place, and time.     Data Reviewed: I have personally reviewed following labs and imaging studies  CBC: Recent Labs  Lab 07/02/23 0448 07/03/23 0407 07/04/23 0006 07/08/23 0442  WBC 15.4* 15.2* 13.2* 20.2*   NEUTROABS 11.8* 10.8* 8.9*  --   HGB 8.6* 8.5* 8.0* 8.2*  HCT 27.8* 28.0* 25.7* 26.3*  MCV 88.0 89.7 87.7 87.7  PLT 427* 478* 501* 764*   Basic Metabolic Panel: Recent Labs  Lab 07/02/23 0448 07/03/23 0407 07/04/23 0006 07/07/23 0325 07/08/23 0442  NA 137 135 133* 132* 133*  K 3.7 3.5 3.3* 3.6 3.8  CL 101 98 97*  93* 95*  CO2 27 29 29 28 28   GLUCOSE 131* 121* 119* 124* 121*  BUN 25* 24* 23* 24* 28*  CREATININE 1.08 1.13 1.04 0.92 1.11  CALCIUM 8.4* 8.3* 8.1* 8.6* 8.7*  MG 2.2 2.1  --   --   --   PHOS 4.1  --   --   --   --    GFR: Estimated Creatinine Clearance: 69.3 mL/min (by C-G formula based on SCr of 1.11 mg/dL). Liver Function Tests: Recent Labs  Lab 07/02/23 0448 07/03/23 0407 07/08/23 0442  AST 48* 46* 29  ALT 49* 47* 45*  ALKPHOS 117 110 94  BILITOT 0.5 0.2 0.3  PROT 8.6* 8.3* 8.9*  ALBUMIN 1.9* 1.9* 2.0*   CBG: Recent Labs  Lab 07/03/23 0719 07/03/23 1154 07/03/23 1656 07/03/23 2026 07/04/23 0716  GLUCAP 127* 135* 120* 88 130*   Recent Results (from the past 240 hours)  Culture, Respiratory w Gram Stain     Status: None   Collection Time: 06/29/23  1:25 PM   Specimen: Tracheal Aspirate; Respiratory  Result Value Ref Range Status   Specimen Description   Final    TRACHEAL ASPIRATE Performed at [facility], [address] [place], [place] [address]    Special Requests   Final    NONE Performed at [facility], [address], [place], [place] [address]    Gram Stain   Final    NO WBC SEEN RARE GRAM POSITIVE COCCI RARE GRAM NEGATIVE RODS    Culture   Final    ABUNDANT METHICILLIN RESISTANT STAPHYLOCOCCUS AUREUS FEW PSEUDOMONAS AERUGINOSA HEALTH DEPARTMENT NOTIFIED Performed at [facility], [address] [place], [place] [address]    Report Status 07/01/2023 FINAL  Final   Organism ID, Bacteria METHICILLIN RESISTANT STAPHYLOCOCCUS AUREUS  Final   Organism ID, Bacteria PSEUDOMONAS  AERUGINOSA  Final      Susceptibility   Methicillin resistant staphylococcus aureus - MIC*    CIPROFLOXACIN >=8 RESISTANT Resistant     ERYTHROMYCIN >=8 RESISTANT Resistant     GENTAMICIN <=0.5 SENSITIVE Sensitive     OXACILLIN >=4 RESISTANT Resistant     TETRACYCLINE <=1 SENSITIVE Sensitive     VANCOMYCIN 1 SENSITIVE Sensitive     TRIMETH/SULFA >=320 RESISTANT Resistant     CLINDAMYCIN <=0.25 SENSITIVE Sensitive     RIFAMPIN <=0.5 SENSITIVE Sensitive     Inducible Clindamycin NEGATIVE Sensitive     LINEZOLID 2 SENSITIVE Sensitive     * ABUNDANT METHICILLIN RESISTANT STAPHYLOCOCCUS AUREUS   Pseudomonas aeruginosa - MIC*    CEFTAZIDIME 2 SENSITIVE Sensitive     CIPROFLOXACIN 2 RESISTANT Resistant     GENTAMICIN <=1 SENSITIVE Sensitive     IMIPENEM >=16 RESISTANT Resistant     PIP/TAZO <=4 SENSITIVE Sensitive ug/mL    CEFEPIME 2 SENSITIVE Sensitive     * FEW PSEUDOMONAS AERUGINOSA  Culture, blood (Routine X 2) w Reflex to ID Panel     Status: None   Collection Time: 06/30/23 10:22 AM   Specimen: BLOOD  Result Value Ref Range Status   Specimen Description   Final    BLOOD BLOOD LEFT HAND AEROBIC BOTTLE ONLY Performed at [facility], [address] [place], [place] [address]    Special Requests   Final    BOTTLES DRAWN AEROBIC ONLY Blood Culture results may not be optimal due to an inadequate volume of blood received in culture bottles Performed at [facility], [address], [place], [place] [address]    Culture   Final    NO GROWTH 5 DAYS Performed at [facility], [address], [place], [place] [address]    Report Status 07/05/2023 FINAL  Final  Culture, blood (Routine X 2) w Reflex to ID Panel     Status: None   Collection Time: 06/30/23 10:28 AM   Specimen: BLOOD  Result Value Ref Range Status   Specimen Description   Final    BLOOD BLOOD LEFT HAND AEROBIC BOTTLE ONLY Performed at [facility], [address] [place], [place] [address]    Special Requests   Final    BOTTLES DRAWN AEROBIC ONLY Blood Culture results may not be optimal due to an inadequate volume of blood received in culture bottles Performed at [facility], [address] [place], [place] [address]    Culture   Final    NO GROWTH 5 DAYS Performed at [facility], [address] [place], [place] [address]    Report Status 07/05/2023 FINAL  Final    Scheduled Meds:  carvedilol  6.25 mg Oral BID WC   Chlorhexidine Gluconate Cloth  6 each Topical Daily   chlorpheniramine-HYDROcodone  5 mL Oral Q12H   clonazePAM  1 mg Oral BID   enoxaparin (LOVENOX) injection  60 mg Subcutaneous Q12H   feeding supplement  237 mL Oral TID BM   guaiFENesin  20 mL Oral BID   lidocaine  1 patch Transdermal Q24H   multivitamin with minerals  1 tablet Oral Daily   oxyCODONE  10 mg Oral QID   pantoprazole  40 mg Oral Daily   polyethylene glycol  17 g Oral Daily   senna-docusate  2 tablet Oral BID   sodium chloride flush  10-40 mL Intracatheter Q12H   thiamine  100 mg Oral Daily   Continuous Infusions:  vancomycin 500 mg (07/08/23 0916)     LOS: 25 days   Time spent: 40 minutes  Carollee Herter, DO  [facility]  07/08/2023, 1:50 PM

## 2023-07-09 DIAGNOSIS — I82423 Acute embolism and thrombosis of iliac vein, bilateral: Secondary | ICD-10-CM | POA: Insufficient documentation

## 2023-07-09 LAB — CBC WITH DIFFERENTIAL/PLATELET
Abs Immature Granulocytes: 0.3 10*3/uL — ABNORMAL HIGH (ref 0.00–0.07)
Basophils Absolute: 0.1 10*3/uL (ref 0.0–0.1)
Basophils Relative: 0 %
Eosinophils Absolute: 0.2 10*3/uL (ref 0.0–0.5)
Eosinophils Relative: 1 %
HCT: 25.7 % — ABNORMAL LOW (ref 39.0–52.0)
Hemoglobin: 8.1 g/dL — ABNORMAL LOW (ref 13.0–17.0)
Immature Granulocytes: 2 %
Lymphocytes Relative: 18 %
Lymphs Abs: 3.4 10*3/uL (ref 0.7–4.0)
MCH: 27.6 pg (ref 26.0–34.0)
MCHC: 31.5 g/dL (ref 30.0–36.0)
MCV: 87.4 fL (ref 80.0–100.0)
Monocytes Absolute: 1.4 10*3/uL — ABNORMAL HIGH (ref 0.1–1.0)
Monocytes Relative: 7 %
Neutro Abs: 14 10*3/uL — ABNORMAL HIGH (ref 1.7–7.7)
Neutrophils Relative %: 72 %
Platelets: 737 10*3/uL — ABNORMAL HIGH (ref 150–400)
RBC: 2.94 MIL/uL — ABNORMAL LOW (ref 4.22–5.81)
RDW: 16.2 % — ABNORMAL HIGH (ref 11.5–15.5)
WBC: 19.3 10*3/uL — ABNORMAL HIGH (ref 4.0–10.5)
nRBC: 0 % (ref 0.0–0.2)

## 2023-07-09 MED ORDER — ORAL CARE MOUTH RINSE: 15.0000 mL | OROMUCOSAL | Status: DC | PRN

## 2023-07-09 MED ORDER — APIXABAN 5 MG PO TABS: 5.0000 mg | ORAL_TABLET | Freq: Two times a day (BID) | ORAL | Status: DC

## 2023-07-09 MED ORDER — APIXABAN 5 MG PO TABS
5.0000 mg | ORAL_TABLET | Freq: Two times a day (BID) | ORAL | Status: DC
  Administered 2023-07-09 – 2023-07-14 (×10): 5 mg via ORAL
  Filled 2023-07-09 (×10): qty 1

## 2023-07-09 MED ORDER — APIXABAN 5 MG PO TABS: 10.0000 mg | ORAL_TABLET | Freq: Two times a day (BID) | ORAL | Status: DC

## 2023-07-09 NOTE — Assessment & Plan Note (Signed)
 Nutrition following their input appreciated Continue thiamine, nutritional supplements

## 2023-07-09 NOTE — Assessment & Plan Note (Signed)
 Complicated course earlier in the hospitalization requiring endotracheal intubation 3/11  Clinical course complicated by presence of left-sided pneumothorax status post left pigtail chest tube placement which has since been removed Clinical course additionally complicated by multifocal bilateral pneumonia Status post extubation 3/24

## 2023-07-09 NOTE — Plan of Care (Signed)

## 2023-07-09 NOTE — Assessment & Plan Note (Signed)
 Identified 3/14 Initially treated with heparin, now on Lovenox We will now transition to oral Eliquis in preparation for discharge

## 2023-07-09 NOTE — TOC Progression Note (Addendum)
 Transition of Care [facility]) - Progression Note    Patient Details  Name: Brendan Preston MRN: [id number] Date of Birth: [birth year]  Transition [facility] CM/SW Contact  Otelia Santee, LCSW Phone Number: 07/09/2023, 10:23 AM  Clinical Narrative:    No SNF able to accept for rehab.   Spoke with pt to discuss discharge plans following completion of IV abx on 07/13/23. Pt reports he will "figure something out." CSW inquired about pt staying with his parents at discharge. Pt shares he will reach out to them to determine if he is able to stay with them. Pt declined for CSW to reach out to parents. CSW will follow up with pt to determine if he is able to discharge to parents house on 4/7.   ADDENDUM: Received message from RN that pt's mother requesting to speak with SW. Pt provided consent for CSW to call. Spoke with pt's mother, Herbert Seta to inform of discharge plans. Pt's mother agreeable for pt to stay with her in [place] at discharge. Unable to obtain PT follow up due to plan for pt to discharge to [facility] and lack of payor source. Pt's mother apprehensive about pt discharging without having PT follow up. CSW reviewed pt's current functioning based on PT session today, 4/2. Pt mother reports she will be at the hospital on Monday to pick patient up and is unsure of time frame at this time.     Barriers to Discharge: Continued Medical Work up  Expected Discharge Plan and Services     Post Acute Care Choice: NA Living arrangements for the past 2 months: Single Family Home                   DME Agency: NA                   Social Determinants of Health (SDOH) Interventions SDOH Screenings   Food Insecurity: Patient Declined (06/15/2023)  Housing: Patient Unable To Answer (06/29/2023)  Transportation Needs: Patient Unable To Answer (06/16/2023)  Utilities: Patient Unable To Answer (06/16/2023)  Tobacco Use: High Risk (06/29/2023)    Readmission Risk Interventions    07/09/2023   10:22 AM  06/14/2023    8:46 AM  Readmission Risk Prevention Plan  Post Dischage Appt  Complete  Medication Screening  Complete  Transportation Screening Complete Complete  PCP or Specialist Appt within 5-7 Days Complete   Home Care Screening Complete   Medication Review (RN CM) Complete

## 2023-07-09 NOTE — Assessment & Plan Note (Signed)
Severe sepsis resolved

## 2023-07-09 NOTE — Assessment & Plan Note (Signed)
 Covered by intravenous antibiotic therapy as noted above

## 2023-07-09 NOTE — Discharge Instructions (Signed)
 Information on my medicine - ELIQUIS (apixaban)  Why was Eliquis prescribed for you? Eliquis was prescribed to treat blood clots that may have been found in the veins of your legs (deep vein thrombosis) or in your lungs (pulmonary embolism) and to reduce the risk of them occurring again.  What do You need to know about Eliquis ? The dose 5 mg (one) tablet taken TWICE daily.  Eliquis may be taken with or without food.   Try to take the dose about the same time in the morning and in the evening. If you have difficulty swallowing the tablet whole please discuss with your pharmacist how to take the medication safely.  Take Eliquis exactly as prescribed and DO NOT stop taking Eliquis without talking to the doctor who prescribed the medication.  Stopping may increase your risk of developing a new blood clot.  Refill your prescription before you run out.  After discharge, you should have regular check-up appointments with your healthcare provider that is prescribing your Eliquis.    What do you do if you miss a dose? If a dose of ELIQUIS is not taken at the scheduled time, take it as soon as possible on the same day and twice-daily administration should be resumed. The dose should not be doubled to make up for a missed dose.  Important Safety Information A possible side effect of Eliquis is bleeding. You should call your healthcare provider right away if you experience any of the following: Bleeding from an injury or your nose that does not stop. Unusual colored urine (red or dark brown) or unusual colored stools (red or black). Unusual bruising for unknown reasons. A serious fall or if you hit your head (even if there is no bleeding).  Some medicines may interact with Eliquis and might increase your risk of bleeding or clotting while on Eliquis. To help avoid this, consult your healthcare provider or pharmacist prior to using any new prescription or non-prescription medications,  including herbals, vitamins, non-steroidal anti-inflammatory drugs (NSAIDs) and supplements.  This website has more information on Eliquis (apixaban): http://www.eliquis.com/eliquis/home

## 2023-07-09 NOTE — Progress Notes (Signed)
 Pt refused vest therapy after a couple attempts, RN notified and aware. Pt left in NARD

## 2023-07-09 NOTE — Progress Notes (Signed)
 PROGRESS NOTE   Brendan Preston  BJY:[id number] DOB: [birth year] DOA: 06/13/2023 PCP: Patient, No Pcp Per   Date of Service: the patient was seen and examined on 07/09/2023  Brief Narrative:  HPI: 78 with past medical history of known polysubstance (heroin/ fentanyl) abuse in which EMS was called out for AMS found down covered in urine and feces brought into [facility] emergency department for evaluation.  Upon evaluation in the emergency department patient was found to have a fever of 104.3 rectal and other SIRS criteria.  Patient was felt to be suffering from severe sepsis and patient was initially admitted to the [facility] service on 3/7.    Early hospitalization was complicated by drug rash thought to be secondary to cefepime which resolved with adjusting agents.  Early hospital course was also complicated by a fall out of bed on 3/10 resulting in nasal bone fracture.  Early workup revealed MRSA bacteremia on 3/7 blood cultures.  TTE negative for endocarditis.  TEE was initially not performed due to patient refusal.  Patient clinically decompensated and underwent endotracheal intubation on 3/11.  On 3/14 CT imaging of the abdomen revealed a left pneumothorax with bilateral lower lobe consolidation and multiple cavitary lesions and concern for bilateral common iliac DVT.  A left pigtail chest tube was placed for the pneumothorax.  Patient was initiated on intravenous heparin which was later changed to Lovenox on 3/17.  Patient also had additional continued infectious complications including tracheal secretions growing out Pseudomonas with concerning findings for concurrent Pseudomonas pneumonia.  Patient was treated with intravenous meropenem.  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  Infectious ease consultation was obtained and with  infectious disease guidance recommendation was for patient to ultimately be treated with a total of 8 weeks of antibiotics.  A total of 4 weeks of intravenous antibiotics to be completed on 4/6.  2 weeks of linezolid by mouth until 4/30 and then 2 additional weeks of oral doxycycline thereafter.  Respiratory wise, patient clinically improved and was extubated on 3/24.  Patient was transferred to the hospital service on 3/26.  PICC line was removed on 3/28.       Assessment & Plan MRSA bacteremia Zosyn completed 07/07/2023. Patient now receiving intravenous vancomycin to be completed 07/13/2023 Patient then to be transition to oral linezolid for total of 2 weeks followed by 2 weeks of oral doxycycline. Infectious disease has signed off and is following peripherally Blood culture 3/7 for MRSA.  Blood cultures on 3/9 documented clearance. 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 (HCC) Identified 3/14 Initially treated with heparin, now on Lovenox We will now transition to oral Eliquis in preparation for discharge IVDU (intravenous drug user) History of polysubstance abuse with heroin and fentanyl as severely limited the patient's ability to be placed in a skilled nursing facility where acute rehabilitation center Counseling on cessation daily Critical illness myopathy Substantial generalized weakness as a result Difficulty with placement as noted above Physical therapy  working with patient  daily Severe sepsis (HCC) Severe sepsis resolved Malnutrition of moderate degree Nutrition following their input appreciated Continue thiamine, nutritional supplements    Subjective:  Patient complaining of continued generalized weakness.  Patient states that this weakness is severe but seems to be improving with regular physical therapy.  Physical Exam:  Vitals:   07/08/23 1950 07/09/23 0500 07/09/23 0531 07/09/23 0805  BP: 124/76  118/77   Pulse: (!) 108  89 95  Resp: 18  18   Temp: 98.8 F (37.1 C)  98.1 F (36.7 C)   TempSrc: Oral  Oral   SpO2: 96%  93%   Weight:  60.3 kg    Height:        Constitutional: Awake alert and oriented x3, no associated distress.   Skin: no rashes, no lesions, good skin turgor noted. Eyes: Pupils are equally reactive to light.  No evidence of scleral icterus or conjunctival pallor.  ENMT: Moist mucous membranes noted.  Posterior pharynx clear of any exudate or lesions.   Respiratory: clear to auscultation bilaterally, no wheezing, no crackles. Normal respiratory effort. No accessory muscle use.  Cardiovascular: Regular rate and rhythm, no murmurs / rubs / gallops. No extremity edema. 2+ pedal pulses. No carotid bruits.  Abdomen: Abdomen is soft and nontender.  No evidence of intra-abdominal masses.  Positive bowel sounds noted in all quadrants.   Musculoskeletal: No joint deformity upper and lower extremities. Good ROM, no contractures.  poor muscle tone.   Data Reviewed:  I have personally reviewed and interpreted labs, imaging.  Significant findings are   CBC: Recent Labs  Lab 07/03/23 0407 07/04/23 0006 07/08/23 0442 07/09/23 0408  WBC 15.2* 13.2* 20.2* 19.3*  NEUTROABS 10.8* 8.9*  --  14.0*  HGB 8.5* 8.0* 8.2* 8.1*  HCT 28.0* 25.7* 26.3* 25.7*  MCV 89.7 87.7 87.7 87.4  PLT 478* 501* 764* 737*   Basic Metabolic Panel: Recent Labs  Lab 07/03/23 0407 07/04/23 0006 07/07/23 0325 07/08/23 0442  NA 135 133* 132* 133*  K  3.5 3.3* 3.6 3.8  CL 98 97* 93* 95*  CO2 29 29 28 28   GLUCOSE 121* 119* 124* 121*  BUN 24* 23* 24* 28*  CREATININE 1.13 1.04 0.92 1.11  CALCIUM 8.3* 8.1* 8.6* 8.7*  MG 2.1  --   --   --    GFR: Estimated Creatinine Clearance: 68.7 mL/min (by C-G formula based on SCr of 1.11 mg/dL). Liver Function Tests: Recent Labs  Lab 07/03/23 0407 07/08/23 0442  AST 46* 29  ALT 47* 45*  ALKPHOS 110 94  BILITOT 0.2 0.3  PROT 8.3* 8.9*  ALBUMIN 1.9* 2.0*    Code Status:  Full code.  Code status decision has been confirmed with: patient Family Communication: Plan of care discussed with mother via phone conversation.   Severity of Illness:  The appropriate patient status for this patient is INPATIENT. Inpatient status is judged to be reasonable and necessary in order to provide the required intensity of service to ensure the patient's safety. The patient's presenting symptoms, physical exam findings, and initial radiographic and laboratory data in the context of their chronic comorbidities is felt to place them at high risk for further clinical deterioration. Furthermore, it is not anticipated that the patient will be medically stable for discharge from the hospital within 2 midnights of admission.   * I certify that at the point of admission it is my clinical judgment that the patient will require inpatient hospital care spanning  beyond 2 midnights from the point of admission due to high intensity of service, high risk for further deterioration and high frequency of surveillance required.*  Time spent:  58 minutes  Author:  Marinda Elk MD  07/09/2023 9:52 AM

## 2023-07-09 NOTE — Assessment & Plan Note (Signed)
 History of polysubstance abuse with heroin and fentanyl as severely limited the patient's ability to be placed in a skilled nursing facility where acute rehabilitation center Counseling on cessation daily

## 2023-07-09 NOTE — Plan of Care (Signed)
  Problem: Education: Goal: Knowledge of General Education information will improve Description: Including pain rating scale, medication(s)/side effects and non-pharmacologic comfort measures Outcome: Progressing   Problem: Coping: Goal: Level of anxiety will decrease Outcome: Progressing   Problem: Elimination: Goal: Will not experience complications related to bowel motility Outcome: Progressing   Problem: Pain Managment: Goal: General experience of comfort will improve and/or be controlled Outcome: Progressing   Problem: Safety: Goal: Ability to remain free from injury will improve Outcome: Progressing   Problem: Coping: Goal: Ability to adjust to condition or change in health will improve Outcome: Progressing

## 2023-07-09 NOTE — Plan of Care (Signed)
   Problem: Nutrition: Goal: Adequate nutrition will be maintained Outcome: Progressing   Problem: Pain Managment: Goal: General experience of comfort will improve and/or be controlled Outcome: Progressing   Problem: Safety: Goal: Ability to remain free from injury will improve Outcome: Progressing

## 2023-07-09 NOTE — Assessment & Plan Note (Signed)
 Zosyn completed 07/07/2023. Patient now receiving intravenous vancomycin to be completed 07/13/2023 Patient then to be transition to oral linezolid for total of 2 weeks followed by 2 weeks of oral doxycycline. Infectious disease has signed off and is following peripherally Blood culture 3/7 for MRSA.  Blood cultures on 3/9 documented clearance.

## 2023-07-09 NOTE — Assessment & Plan Note (Signed)
 Substantial generalized weakness as a result Difficulty with placement as noted above Physical therapy working with patient daily

## 2023-07-09 NOTE — Progress Notes (Signed)
 Physical Therapy Treatment Patient Details Name: Brendan Preston MRN: [id number] DOB: 15-Aug-1975 Today's Date: 07/09/2023   History of Present Illness 2 yoM admitted 06/13/23 with AMS, fever, tachycardic, normo to slightly hypertensive . Patient intubated 3/13-3/24.PMH: polysubstance abuse    PT Comments  Pt verbalizing frustrations regarding d/c plan and telesitter present in room, offered active listening, encouragement, redirected to skilled PT interventions and goals. Pt comes to sitting EOB With bedrail use, supv without cues or assist. Pt tolerates seated exercises without complaints. Pt reports inability to dorsiflex L foot when seated EOB; pt demonstrates L foot dorsiflexion while mobilizing to EOB and with gait. Pt completes STS reps from EOB and recliner with CGA, BUE assisting to power up, slow to rise up, hip extension noted last. Pt amb 80 ft with RW, demonstrates step through gait pattern, equal bil step length, equal bil dorsiflexion, no drifting/veering, able to navigate around obstacles in hallway without LOB, no LE buckling noted, CGA with chair follow due to 1st significant walk but pt not needing seated rest break. Pt denies dizziness, lightheadedness, increased pain throughout session; HR 105 and SPO2 95 with amb on RA. Returned to room and pt tolerates remaining up in recliner with all needs in reach at EOS.   If plan is discharge home, recommend the following: Assistance with cooking/housework;Assist for transportation   Can travel by private vehicle     Yes  Equipment Recommendations  Other (comment) (RW vs rollator)    Recommendations for Other Services       Precautions / Restrictions Precautions Precautions: Fall Recall of Precautions/Restrictions: Intact Restrictions Weight Bearing Restrictions Per Provider Order: No     Mobility  Bed Mobility Overal bed mobility: Needs Assistance       Supine to sit: Supervision, Used rails     General bed mobility  comments: no verbal cues, using bedrails to upright trunk into sitting without physical assistance    Transfers Overall transfer level: Needs assistance Equipment used: Rolling walker (2 wheels) Transfers: Sit to/from Stand Sit to Stand: Contact guard assist           General transfer comment: CGA to power up from EOB and recliner, BUE assisting to power up    Ambulation/Gait Ambulation/Gait assistance: Contact guard assist Gait Distance (Feet): 80 Feet Assistive device: Rolling walker (2 wheels) Gait Pattern/deviations: Step-through pattern, Decreased stride length Gait velocity: decreased     General Gait Details: step through gait pattern, good bil foot clearance with symmetrical bil dorsiflexion noted, equal bil step length, no drifting or veering, able to maneuver RW around obstacles in hallway without LOB, no LE Buckling noted, conversational without SOB noted   Stairs             Wheelchair Mobility     Tilt Bed    Modified Rankin (Stroke Patients Only)       Balance   Sitting-balance support: Feet supported Sitting balance-Leahy Scale: Good     Standing balance support: During functional activity, Bilateral upper extremity supported, Reliant on assistive device for balance Standing balance-Leahy Scale: Poor                              Communication Communication Communication: No apparent difficulties  Cognition Arousal: Alert Behavior During Therapy: WFL for tasks assessed/performed, Agitated   PT - Cognitive impairments: No apparent impairments  PT - Cognition Comments: pt verbalizing frustrations regarding telesitter, d/c plan - active listening and encouragement provided Following commands: Intact      Cueing    Exercises General Exercises - Lower Extremity Long Arc Quad: Seated, AROM, Strengthening, Both, 15 reps Hip Flexion/Marching: Seated, AROM, Strengthening, Both, 15 reps    General  Comments        Pertinent Vitals/Pain Pain Assessment Pain Assessment: Faces Faces Pain Scale: Hurts a little bit Pain Location: back Pain Descriptors / Indicators: Discomfort ("same as always") Pain Intervention(s): Limited activity within patient's tolerance, Monitored during session, Repositioned    Home Living                          Prior Function            PT Goals (current goals can now be found in the care plan section) Acute Rehab PT Goals Patient Stated Goal: to get up PT Goal Formulation: With patient Time For Goal Achievement: 07/15/23 Potential to Achieve Goals: Good Progress towards PT goals: Progressing toward goals    Frequency    Min 2X/week      PT Plan      Co-evaluation              AM-PAC PT "6 Clicks" Mobility   Outcome Measure  Help needed turning from your back to your side while in a flat bed without using bedrails?: A Little Help needed moving from lying on your back to sitting on the side of a flat bed without using bedrails?: A Little Help needed moving to and from a bed to a chair (including a wheelchair)?: A Little Help needed standing up from a chair using your arms (e.g., wheelchair or bedside chair)?: A Little Help needed to walk in hospital room?: A Little Help needed climbing 3-5 steps with a railing? : A Lot 6 Click Score: 17    End of Session Equipment Utilized During Treatment: Gait belt Activity Tolerance: Patient tolerated treatment well Patient left: in chair;with call bell/phone within reach;with chair alarm set Nurse Communication: Mobility status;Other (comment) (RN and MD regarding L foot dorsiflexion) PT Visit Diagnosis: Unsteadiness on feet (R26.81);Muscle weakness (generalized) (M62.81);Difficulty in walking, not elsewhere classified (R26.2);Other abnormalities of gait and mobility (R26.89)     Time: 1610-9604 PT Time Calculation (min) (ACUTE ONLY): 33 min  Charges:    $Gait Training: 8-22  mins $Therapeutic Exercise: 8-22 mins PT General Charges $$ ACUTE PT VISIT: 1 Visit                     Tori Caffie Sotto PT, DPT 07/09/23, 12:11 PM

## 2023-07-10 LAB — COMPREHENSIVE METABOLIC PANEL WITH GFR
ALT: 37 U/L (ref 0–44)
AST: 26 U/L (ref 15–41)
Albumin: 2 g/dL — ABNORMAL LOW (ref 3.5–5.0)
Alkaline Phosphatase: 84 U/L (ref 38–126)
Anion gap: 9 (ref 5–15)
BUN: 25 mg/dL — ABNORMAL HIGH (ref 6–20)
CO2: 29 mmol/L (ref 22–32)
Calcium: 8.8 mg/dL — ABNORMAL LOW (ref 8.9–10.3)
Chloride: 94 mmol/L — ABNORMAL LOW (ref 98–111)
Creatinine, Ser: 0.96 mg/dL (ref 0.61–1.24)
GFR, Estimated: >60 mL/min (ref >60)
Glucose, Bld: 123 mg/dL — ABNORMAL HIGH (ref 70–99)
Potassium: 3.7 mmol/L (ref 3.5–5.1)
Sodium: 132 mmol/L — ABNORMAL LOW (ref 135–145)
Total Bilirubin: 0.5 mg/dL (ref 0.0–1.2)
Total Protein: 9.1 g/dL — ABNORMAL HIGH (ref 6.5–8.1)

## 2023-07-10 NOTE — Progress Notes (Signed)
 Physical Therapy Treatment Patient Details Name: Padraig Nhan MRN: [id number] DOB: 1975-06-12 Today's Date: 07/10/2023   History of Present Illness 48 yo male admitted 06/13/23 with AMS, fever, tachycardic, normo to slightly hypertensive . Patient intubated 3/13-3/24. PMH: polysubstance abuse    PT Comments  Pt reports he ambulated too far yesterday but agreeable to ambulate again today if shorter distance.  Pt reports back pain and continues to utilize RW for mobility.  Current d/c plan is for home with mom in [facility] ([facility] refer to [facility] notes).  Pt would benefit from RW upon d/c.     If plan is discharge home, recommend the following: Assistance with cooking/housework;Assist for transportation   Can travel by private vehicle        Equipment Recommendations  Rolling walker (2 wheels)    Recommendations for Other Services       Precautions / Restrictions Precautions Precautions: Fall Recall of Precautions/Restrictions: Intact     Mobility  Bed Mobility Overal bed mobility: Needs Assistance Bed Mobility: Rolling, Sidelying to Sit, Sit to Supine Rolling: Supervision Sidelying to sit: Supervision   Sit to supine: Supervision        Transfers Overall transfer level: Needs assistance Equipment used: Rolling walker (2 wheels) Transfers: Sit to/from Stand Sit to Stand: Contact guard assist           General transfer comment: CGA for safety; no physical assist but reports back pain    Ambulation/Gait Ambulation/Gait assistance: Contact guard assist Gait Distance (Feet): 40 Feet Assistive device: Rolling walker (2 wheels) Gait Pattern/deviations: Step-through pattern, Decreased stride length Gait velocity: decreased     General Gait Details: good use of RW, pt reports not using UEs much  but appears to be strongly weight bearing with increased grip, reports back pain, distance to tolerance   Stairs             Wheelchair Mobility     Tilt Bed     Modified Rankin (Stroke Patients Only)       Balance Overall balance assessment: Needs assistance         Standing balance support: During functional activity, Bilateral upper extremity supported, Reliant on assistive device for balance Standing balance-Leahy Scale: Poor                              Communication Communication Communication: No apparent difficulties  Cognition Arousal: Alert Behavior During Therapy: WFL for tasks assessed/performed   PT - Cognitive impairments: No apparent impairments                         Following commands: Intact      Cueing    Exercises      General Comments        Pertinent Vitals/Pain Pain Assessment Pain Assessment: Faces Faces Pain Scale: Hurts even more Pain Location: back Pain Descriptors / Indicators: Aching, Grimacing Pain Intervention(s): Repositioned, Monitored during session    Home Living                          Prior Function            PT Goals (current goals can now be found in the care plan section) Progress towards PT goals: Progressing toward goals    Frequency    Min 2X/week      PT Plan  Co-evaluation              AM-PAC PT "6 Clicks" Mobility   Outcome Measure  Help needed turning from your back to your side while in a flat bed without using bedrails?: A Little Help needed moving from lying on your back to sitting on the side of a flat bed without using bedrails?: A Little Help needed moving to and from a bed to a chair (including a wheelchair)?: A Little Help needed standing up from a chair using your arms (e.g., wheelchair or bedside chair)?: A Little Help needed to walk in hospital room?: A Little Help needed climbing 3-5 steps with a railing? : A Lot 6 Click Score: 17    End of Session Equipment Utilized During Treatment: Gait belt Activity Tolerance: Patient tolerated treatment well Patient left: with call bell/phone within  reach;in bed;with bed alarm set;Other (comment) (telesitter)   PT Visit Diagnosis: Difficulty in walking, not elsewhere classified (R26.2);Muscle weakness (generalized) (M62.81)     Time: 1100-1118 PT Time Calculation (min) (ACUTE ONLY): 18 min  Charges:    $Gait Training: 8-22 mins PT General Charges $$ ACUTE PT VISIT: 1 Visit                     Thomasene Mohair PT, DPT Physical Therapist Acute Rehabilitation Services Office: [phone]    Kati L Payson 07/10/2023, 1:20 PM

## 2023-07-10 NOTE — Assessment & Plan Note (Addendum)
 Substantial generalized weakness as a result Strength seems to be slowly improvin Difficulty with placement as noted above Physical therapy working with patient daily

## 2023-07-10 NOTE — Assessment & Plan Note (Addendum)
 Identified 3/14 Initially treated with heparin, followed by Lovenox Patient then transition to oral Eliquis on 4/3.

## 2023-07-10 NOTE — Progress Notes (Signed)
 PROGRESS NOTE   Brendan Preston  WGN:[id number] DOB: July 08, 1975 DOA: 06/13/2023 PCP: Patient, No Pcp Per   Date of Service: the patient was seen and examined on 07/10/2023  Brief Narrative:  HPI: 18 with past medical history of known polysubstance (heroin/ fentanyl) abuse in which EMS was called out for AMS found down covered in urine and feces brought into [facility] emergency department for evaluation.  Upon evaluation in the emergency department patient was found to have a fever of 104.3 rectal and other SIRS criteria.  Patient was felt to be suffering from severe sepsis and patient was initially admitted to the [facility] service on 3/7.    Early hospitalization was complicated by drug rash thought to be secondary to cefepime which resolved with adjusting agents.  Early hospital course was also complicated by a fall out of bed on 3/10 resulting in nasal bone fracture.  Early workup revealed MRSA bacteremia on 3/7 blood cultures.  TTE negative for endocarditis.  TEE was initially not performed due to patient refusal.  Patient clinically decompensated and underwent endotracheal intubation on 3/11.  On 3/14 CT imaging of the abdomen revealed a left pneumothorax with bilateral lower lobe consolidation and multiple cavitary lesions and concern for bilateral common iliac DVT.  A left pigtail chest tube was placed for the pneumothorax.  Patient was initiated on intravenous heparin which was later changed to Lovenox on 3/17.  Patient also had additional continued infectious complications including tracheal secretions growing out Pseudomonas with concerning findings for concurrent Pseudomonas pneumonia.  Patient was treated with intravenous meropenem.  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  Infectious ease consultation was obtained and with  infectious disease guidance recommendation was for patient to ultimately be treated with a total of 8 weeks of antibiotics.  A total of 4 weeks of intravenous antibiotics to be completed on 4/6.  2 weeks of linezolid by mouth until 4/30 and then 2 additional weeks of oral doxycycline thereafter.  Respiratory wise, patient clinically improved and was extubated on 3/24.  Patient was transferred to the hospital service on 3/26.  PICC line was removed on 3/28.       Assessment & Plan MRSA bacteremia Zosyn completed 07/07/2023. Patient now receiving intravenous vancomycin to be completed 07/13/2023 Patient then to be transitioned to oral linezolid for total of 2 weeks followed by 2 weeks of oral doxycycline. Infectious disease has signed off and is following peripherally Blood culture 3/7 for MRSA.  Blood cultures on 3/9 documented clearance. 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 IVDU (intravenous drug user) History of polysubstance abuse with heroin and fentanyl as severely limited the patient's ability to be placed in a skilled nursing facility where acute rehabilitation center Counseling on cessation daily Critical illness myopathy Substantial generalized weakness as a result Strength seems to be slowly improvin Difficulty with placement as noted  above Physical therapy  working with patient daily Severe sepsis (HCC) Severe sepsis resolved Malnutrition of moderate degree Nutrition following their input appreciated Continue thiamine, nutritional supplements    Subjective:  Patient reports that he feels that his generalized weakness has slightly improved since yesterday.  Patient reports his appetite is good.  Patient denies any pain at this time.   Physical Exam:  Vitals:   07/09/23 2006 07/10/23 0500 07/10/23 0507 07/10/23 0606  BP: 114/71  111/68   Pulse: 100  96   Resp: 18  17   Temp: 99.6 F (37.6 C)  97.8 F (36.6 C)   TempSrc: Oral  Oral   SpO2: 95%  91% 92%  Weight:  60.1 kg    Height:        Constitutional: Awake alert and oriented x3, no associated distress.  Patient is cachectic Skin: no rashes, no lesions, good skin turgor noted. Eyes: Pupils are equally reactive to light.  No evidence of scleral icterus or conjunctival pallor.  ENMT: Temporal wasting noted, moist mucous membranes noted.  Posterior pharynx clear of any exudate or lesions.   Respiratory: clear to auscultation bilaterally, no wheezing, no crackles. Normal respiratory effort. No accessory muscle use.  Cardiovascular: Regular rate and rhythm, no murmurs / rubs / gallops. No extremity edema. 2+ pedal pulses. No carotid bruits.  Abdomen: Abdomen is soft and nontender.  No evidence of intra-abdominal masses.  Positive bowel sounds noted in all quadrants.   Musculoskeletal: No joint deformity upper and lower extremities. Good ROM, no contractures.  poor muscle tone.   Data Reviewed:  I have personally reviewed and interpreted labs, imaging.  Significant findings are   CBC: Recent Labs  Lab 07/04/23 0006 07/08/23 0442 07/09/23 0408  WBC 13.2* 20.2* 19.3*  NEUTROABS 8.9*  --  14.0*  HGB 8.0* 8.2* 8.1*  HCT 25.7* 26.3* 25.7*  MCV 87.7 87.7 87.4  PLT 501* 764* 737*   Basic Metabolic Panel: Recent Labs  Lab 07/04/23 0006 07/07/23 0325 07/08/23 0442  07/10/23 0451  NA 133* 132* 133* 132*  K 3.3* 3.6 3.8 3.7  CL 97* 93* 95* 94*  CO2 29 28 28 29   GLUCOSE 119* 124* 121* 123*  BUN 23* 24* 28* 25*  CREATININE 1.04 0.92 1.11 0.96  CALCIUM 8.1* 8.6* 8.7* 8.8*   GFR: Estimated Creatinine Clearance: 79.1 mL/min (by C-G formula based on SCr of 0.96 mg/dL). Liver Function Tests: Recent Labs  Lab 07/08/23 0442 07/10/23 0451  AST 29 26  ALT 45* 37  ALKPHOS 94 84  BILITOT 0.3 0.5  PROT 8.9* 9.1*  ALBUMIN 2.0* 2.0*    Code Status:  Full code.  Code status decision has been confirmed with: patient Family Communication: Plan of care discussed with mother via phone conversation.   Severity of Illness:  The appropriate patient status for this patient is INPATIENT. Inpatient status is judged to be reasonable and necessary in order to provide the required intensity of service to ensure the patient's safety. The patient's presenting symptoms, physical exam findings, and initial radiographic and laboratory data in the context of their chronic comorbidities is felt to place them at high risk for further clinical deterioration. Furthermore, it is not anticipated that the patient will be medically stable for discharge from the hospital within 2 midnights of admission.   * I certify that at the point of admission it is my clinical judgment that the patient will require inpatient hospital care spanning beyond 2 midnights from the point  of admission due to high intensity of service, high risk for further deterioration and high frequency of surveillance required.*  Time spent:  40 minutes  Author:  Marinda Elk MD  07/10/2023 9:29 AM

## 2023-07-10 NOTE — Progress Notes (Signed)
   07/10/23 1900  Chest Physiotherapy Tx  $ Chest Physiotherapy (Vest, Bed, Metaneb)  (Patient refuses at this time)

## 2023-07-10 NOTE — Assessment & Plan Note (Signed)
 Complicated course earlier in the hospitalization requiring endotracheal intubation 3/11  Clinical course complicated by presence of left-sided pneumothorax status post left pigtail chest tube placement which has since been removed Clinical course additionally complicated by multifocal bilateral pneumonia Status post extubation 3/24

## 2023-07-10 NOTE — Assessment & Plan Note (Addendum)
 Zosyn completed 07/07/2023. Patient now receiving intravenous vancomycin to be completed 07/13/2023 Patient then to be transitioned to oral linezolid for total of 2 weeks followed by 2 weeks of oral doxycycline. Infectious disease has signed off and is following peripherally Blood culture 3/7 for MRSA.  Blood cultures on 3/9 documented clearance.

## 2023-07-10 NOTE — Assessment & Plan Note (Signed)
 Covered by intravenous antibiotic therapy as noted above

## 2023-07-10 NOTE — Progress Notes (Signed)
 Pharmacy Antibiotic Note  Brendan Preston is a 48 y.o. male admitted on 06/13/2023 with MRSA bacteremia.  Pharmacy has been consulted for Vancomycin dosing.  Patient adjusted to 500 mg IV Q 12 hours on 4/1. Renal function stable. Noted patient expected to finish therapy 4/6.    Plan: Continue Vancomycin 500 mg IV every 12 hours - Planned EOT 4/6 before switching to orals Monitor renal function, clinical status   Height: 5\' 11"  (180.3 cm) Weight: 60.1 kg (132 lb 7.9 oz) IBW/kg (Calculated) : 75.3  Temp (24hrs), Avg:98.7 F (37.1 C), Min:97.8 F (36.6 C), Max:99.6 F (37.6 C)  Recent Labs  Lab 07/04/23 0006 07/04/23 0932 07/07/23 0325 07/07/23 0830 07/07/23 1422 07/08/23 0442 07/09/23 0408 07/10/23 0451  WBC 13.2*  --   --   --   --  20.2* 19.3*  --   CREATININE 1.04  --  0.92  --   --  1.11  --  0.96  VANCOTROUGH  --  11*  --  10*  --   --   --   --   VANCOPEAK 20*  --   --   --  46*  --   --   --     Estimated Creatinine Clearance: 79.1 mL/min (by C-G formula based on SCr of 0.96 mg/dL).    Allergies  Allergen Reactions   Cefepime Rash    Antimicrobials this admission: Vancomycin 3/7 >>  Cefepime 3/7 x 1 Ceftriaxone 3/8 >> 3/9 Meropenem 3/17 >>3/19 Cipro 3/19 >>3/25 3/25 zosyn >> 4/1  Dose adjustments this admission: 3/8 VR 17 mcg/ml 3/13 VP/VT 26/18 mcg/ml - AUC 543 3/17 VP/VT 31/18 mcg/ml - AUC 600 3/21 VP/VT 33/17 mcg/ml - AUC 618 - reduce to 500 mg/12h (eAUC 412, VT 11) 3/24 VT 20 mcg/ml (anticipated 11 mcg/ml), SCr rising 3/28: VP/VT 20/11 mcg/ml, AUC 396 on 500 mg/12h >> adjust to 1250 mg/24h (eAUC 495, VT ~10) 3/31 VP/VT 46/10 mcg/mi. AUC 553.4 on 1250 q24>> adjust to 500 q12 (eAUC 442.6, VT 10.6)   Microbiology results: 3/7 BCx 4/4 MRSA 3/9 BCx >> NGf 3/12 RCx (TA) >> MRSA 3/16 RCx (TA) >> staph aureus + PsA  Thank you for allowing pharmacy to be a part of this patient's care.  Sharin Mons, PharmD, BCPS, BCIDP Infectious Diseases Clinical  Pharmacist Phone: [phone] 07/10/2023 12:18 PM

## 2023-07-10 NOTE — Plan of Care (Signed)
  Problem: Clinical Measurements: Goal: Respiratory complications will improve Outcome: Progressing   Problem: Coping: Goal: Level of anxiety will decrease Outcome: Progressing   Problem: Pain Managment: Goal: General experience of comfort will improve and/or be controlled Outcome: Progressing

## 2023-07-10 NOTE — Progress Notes (Signed)
CPT not done at this time. Patient is eating dinner.

## 2023-07-10 NOTE — Plan of Care (Signed)
   Problem: Nutrition: Goal: Adequate nutrition will be maintained Outcome: Progressing   Problem: Pain Managment: Goal: General experience of comfort will improve and/or be controlled Outcome: Progressing   Problem: Safety: Goal: Ability to remain free from injury will improve Outcome: Progressing

## 2023-07-10 NOTE — Assessment & Plan Note (Signed)
 Nutrition following their input appreciated Continue thiamine, nutritional supplements

## 2023-07-10 NOTE — Assessment & Plan Note (Signed)
Severe sepsis resolved

## 2023-07-10 NOTE — Progress Notes (Signed)
Patient refused CPT at this time.  

## 2023-07-10 NOTE — Assessment & Plan Note (Signed)
 History of polysubstance abuse with heroin and fentanyl as severely limited the patient's ability to be placed in a skilled nursing facility where acute rehabilitation center Counseling on cessation daily

## 2023-07-11 DIAGNOSIS — R590 Localized enlarged lymph nodes: Secondary | ICD-10-CM | POA: Insufficient documentation

## 2023-07-11 DIAGNOSIS — F17210 Nicotine dependence, cigarettes, uncomplicated: Secondary | ICD-10-CM

## 2023-07-11 DIAGNOSIS — B965 Pseudomonas (aeruginosa) (mallei) (pseudomallei) as the cause of diseases classified elsewhere: Secondary | ICD-10-CM

## 2023-07-11 LAB — COMPREHENSIVE METABOLIC PANEL WITH GFR
ALT: 34 U/L (ref 0–44)
AST: 28 U/L (ref 15–41)
Albumin: 2.1 g/dL — ABNORMAL LOW (ref 3.5–5.0)
Alkaline Phosphatase: 80 U/L (ref 38–126)
Anion gap: 11 (ref 5–15)
BUN: 28 mg/dL — ABNORMAL HIGH (ref 6–20)
CO2: 28 mmol/L (ref 22–32)
Calcium: 8.8 mg/dL — ABNORMAL LOW (ref 8.9–10.3)
Chloride: 94 mmol/L — ABNORMAL LOW (ref 98–111)
Creatinine, Ser: 0.92 mg/dL (ref 0.61–1.24)
GFR, Estimated: >60 mL/min (ref >60)
Glucose, Bld: 123 mg/dL — ABNORMAL HIGH (ref 70–99)
Potassium: 4.1 mmol/L (ref 3.5–5.1)
Sodium: 133 mmol/L — ABNORMAL LOW (ref 135–145)
Total Bilirubin: 0.3 mg/dL (ref 0.0–1.2)
Total Protein: 9.1 g/dL — ABNORMAL HIGH (ref 6.5–8.1)

## 2023-07-11 LAB — CBC
HCT: 25.4 % — ABNORMAL LOW (ref 39.0–52.0)
Hemoglobin: 7.7 g/dL — ABNORMAL LOW (ref 13.0–17.0)
MCH: 27 pg (ref 26.0–34.0)
MCHC: 30.3 g/dL (ref 30.0–36.0)
MCV: 89.1 fL (ref 80.0–100.0)
Platelets: 618 10*3/uL — ABNORMAL HIGH (ref 150–400)
RBC: 2.85 MIL/uL — ABNORMAL LOW (ref 4.22–5.81)
RDW: 15.9 % — ABNORMAL HIGH (ref 11.5–15.5)
WBC: 17.2 10*3/uL — ABNORMAL HIGH (ref 4.0–10.5)
nRBC: 0 % (ref 0.0–0.2)

## 2023-07-11 LAB — C-REACTIVE PROTEIN: CRP: 13.8 mg/dL — ABNORMAL HIGH (ref <1.0)

## 2023-07-11 LAB — MAGNESIUM: Magnesium: 2.1 mg/dL (ref 1.7–2.4)

## 2023-07-11 MED ORDER — NAPROXEN 250 MG PO TABS
500.0000 mg | ORAL_TABLET | Freq: Two times a day (BID) | ORAL | Status: DC
  Administered 2023-07-11 – 2023-07-14 (×6): 500 mg via ORAL
  Filled 2023-07-11 (×6): qty 2

## 2023-07-11 NOTE — Assessment & Plan Note (Signed)
 Covered by intravenous antibiotic therapy as noted above

## 2023-07-11 NOTE — Assessment & Plan Note (Signed)
 Nutrition following their input appreciated Continue thiamine, nutritional supplements

## 2023-07-11 NOTE — Assessment & Plan Note (Signed)
Severe sepsis resolved

## 2023-07-11 NOTE — Assessment & Plan Note (Signed)
 Complicated course earlier in the hospitalization requiring endotracheal intubation 3/11  Clinical course complicated by presence of left-sided pneumothorax status post left pigtail chest tube placement which has since been removed Clinical course additionally complicated by multifocal bilateral pneumonia Status post extubation 3/24

## 2023-07-11 NOTE — Assessment & Plan Note (Addendum)
 Zosyn completed 07/07/2023. Patient now receiving intravenous vancomycin to be completed 07/13/2023 Patient then to be transitioned to oral linezolid for total of 2 weeks followed by 2 weeks of oral doxycycline. Infectious disease is following, their assistance with this case is greatly appreciated. Blood culture 3/7 for MRSA.  Blood cultures on 3/9 documented clearance.

## 2023-07-11 NOTE — Progress Notes (Signed)
 PROGRESS NOTE   Brendan Preston  ZOX:[id number] DOB: [birth year] DOA: 06/13/2023 PCP: Patient, No Pcp Per   Date of Service: the patient was seen and examined on 07/11/2023  Brief Narrative:  HPI: 31 with past medical history of known polysubstance (heroin/ fentanyl) abuse in which EMS was called out for AMS found down covered in urine and feces brought into [facility] emergency department for evaluation.  Upon evaluation in the emergency department patient was found to have a fever of 104.3 rectal and other SIRS criteria.  Patient was felt to be suffering from severe sepsis and patient was initially admitted to the [facility] service on 3/7.    Early hospitalization was complicated by drug rash thought to be secondary to cefepime which resolved with adjusting agents.  Early hospital course was also complicated by a fall out of bed on 3/10 resulting in nasal bone fracture.  Early workup revealed MRSA bacteremia on 3/7 blood cultures.  TTE negative for endocarditis.  TEE was initially not performed due to patient refusal.  Patient clinically decompensated and underwent endotracheal intubation on 3/11.  On 3/14 CT imaging of the abdomen revealed a left pneumothorax with bilateral lower lobe consolidation and multiple cavitary lesions and concern for bilateral common iliac DVT.  A left pigtail chest tube was placed for the pneumothorax.  Patient was initiated on intravenous heparin which was later changed to Lovenox on 3/17.  Patient also had additional continued infectious complications including tracheal secretions growing out Pseudomonas with concerning findings for concurrent Pseudomonas pneumonia.  Patient was treated with intravenous meropenem.  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  Infectious ease consultation was obtained and with  infectious disease guidance recommendation was for patient to ultimately be treated with a total of 8 weeks of antibiotics.  A total of 4 weeks of intravenous antibiotics to be completed on 4/6.  2 weeks of linezolid by mouth until 4/30 and then 2 additional weeks of oral doxycycline thereafter.  Respiratory wise, patient clinically improved and was extubated on 3/24.  Patient was transferred to the hospital service on 3/26.  PICC line was removed on 3/28.       Assessment & Plan MRSA bacteremia Zosyn completed 07/07/2023. Patient now receiving intravenous vancomycin to be completed 07/13/2023 Patient then to be transitioned to oral linezolid for total of 2 weeks followed by 2 weeks of oral doxycycline. Infectious disease is following, their assistance with this case is greatly appreciated. Blood culture 3/7 for MRSA.  Blood cultures on 3/9 documented clearance. 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 Lymphadenopathy, submandibular Reactive secondary to extensive infection  Providing warm compresses and NSAIDs IVDU (intravenous drug user) History of polysubstance abuse with heroin and fentanyl as severely limited the patient's ability to be placed in a skilled nursing facility where acute rehabilitation center Counseling on cessation daily Critical illness  myopathy  Substantial generalized weakness as a result Strength seems to be slowly improving Difficulty with placement as noted above Physical therapy working with patient daily Severe sepsis (HCC) Severe sepsis resolved Malnutrition of moderate degree Nutrition following their input appreciated Continue thiamine, nutritional supplements    Subjective:  Patient reports that he continues to feel slow improvement in his generalized weakness.  Experiencing some submandibular and discomfort associated with swelling and redness of the area.  Physical Exam:  Vitals:   07/10/23 2001 07/11/23 0500 07/11/23 0518 07/11/23 0851  BP: 120/73  99/70 99/70  Pulse: 97  88 88  Resp: 16  15   Temp: 98.7 F (37.1 C)  98.4 F (36.9 C)   TempSrc: Oral  Oral   SpO2: 91%  93%   Weight:  64.4 kg    Height:        Constitutional: Awake alert and oriented x3, no associated distress.  Patient is cachectic Skin: Notable mobile tender swelling in the right submandibular region secondary to lymphadenopathy.  Otherwise, good skin turgor noted.  Eyes: Pupils are equally reactive to light.  No evidence of scleral icterus or conjunctival pallor.  ENMT: Temporal wasting noted, moist mucous membranes noted.  Posterior pharynx clear of any exudate or lesions.   Respiratory: clear to auscultation bilaterally, no wheezing, no crackles. Normal respiratory effort. No accessory muscle use.  Cardiovascular: Regular rate and rhythm, no murmurs / rubs / gallops. No extremity edema. 2+ pedal pulses. No carotid bruits.  Abdomen: Abdomen is soft and nontender.  No evidence of intra-abdominal masses.  Positive bowel sounds noted in all quadrants.   Musculoskeletal: No joint deformity upper and lower extremities. Good ROM, no contractures.  poor muscle tone.   Data Reviewed:  I have personally reviewed and interpreted labs, imaging.  Significant findings are   CBC: Recent Labs  Lab 07/08/23 0442 07/09/23 0408  07/11/23 0328  WBC 20.2* 19.3* 17.2*  NEUTROABS  --  14.0*  --   HGB 8.2* 8.1* 7.7*  HCT 26.3* 25.7* 25.4*  MCV 87.7 87.4 89.1  PLT 764* 737* 618*   Basic Metabolic Panel: Recent Labs  Lab 07/07/23 0325 07/08/23 0442 07/10/23 0451 07/11/23 0328  NA 132* 133* 132* 133*  K 3.6 3.8 3.7 4.1  CL 93* 95* 94* 94*  CO2 28 28 29 28   GLUCOSE 124* 121* 123* 123*  BUN 24* 28* 25* 28*  CREATININE 0.92 1.11 0.96 0.92  CALCIUM 8.6* 8.7* 8.8* 8.8*  MG  --   --   --  2.1   GFR: Estimated Creatinine Clearance: 88.5 mL/min (by C-G formula based on SCr of 0.92 mg/dL). Liver Function Tests: Recent Labs  Lab 07/08/23 0442 07/10/23 0451 07/11/23 0328  AST 29 26 28   ALT 45* 37 34  ALKPHOS 94 84 80  BILITOT 0.3 0.5 0.3  PROT 8.9* 9.1* 9.1*  ALBUMIN 2.0* 2.0* 2.1*    Code Status:  Full code.  Code status decision has been confirmed with: patient Family Communication: Plan of care discussed with mother via phone conversation.   Severity of Illness:  The appropriate patient status for this patient is INPATIENT. Inpatient status is judged to be reasonable and necessary in order to provide the required intensity of service to ensure the patient's safety. The patient's presenting symptoms, physical exam findings, and initial radiographic and laboratory data in the context of their chronic comorbidities is felt to place them at high risk for further clinical deterioration. Furthermore, it is not anticipated that the  patient will be medically stable for discharge from the hospital within 2 midnights of admission.   * I certify that at the point of admission it is my clinical judgment that the patient will require inpatient hospital care spanning beyond 2 midnights from the point of admission due to high intensity of service, high risk for further deterioration and high frequency of surveillance required.*  Time spent:  45 minutes  Author:  Marinda Elk MD  07/11/2023 8:58 AM

## 2023-07-11 NOTE — Progress Notes (Signed)
 [facility]  Date of Admission:  06/13/2023     Lines: 3/29-c lue picc  3/13-3/29 Rue picc   Abx: 3/07-c vancomycin  3/25-4/1 piptazo 3/19-3/25 cipro 3/17-19 meropenem 3/07-09 ceftriaxone   ASSESSMENT: 48 yo male polysubstance abuse (heroin/fentanyl), biba for ams found to have severe sepsis and mrsa bacteremia, complicated by respiratory failure  Other ID problems include: Bilateral pulm nodules presumed mrsa L5 vertebral om with epidural phlegmon HAP with pseudomonas aeruginosa Rash on cefepime  Other issues: Left pneumothorax s/p chest tube 3/14-3/19 Bilateral common iliac veins filling defects on abd pelv ct -- on tx dose enoxoparin Resp failure extubated 3/24    #mrsa bsi #bilateral pulm septic nodules #l4-5 vertebral om and epidural phlegmon 3/07 bcx mrsa 3/07 urine cx negative 3/09 bcx negative  3/08 Tte and 3/13 tee no sign of valve vegetation 3/14 abd pelv ct with contrast bilateral common iliac vein filling defect but no occult abscess 3/15 thoracic and lumbar spine L4-5 vertebral om and epidural phlegmon 3/25 last fever before transitioning to piptazo 4/04 continued clinical improvement, off oxygen. Right sided submandibular lymphadentitis not suggestive of parotitis -- likely mrsa lymphadenitis. No other focal pain    -continue vanc -8 week tx plan abx for mrsa: 4 weeks iv abx until 07/13/2023,  Then 2 weeks linezolid until 08/06/23,  and then 2 weeks doxycycline Longer abx requirement pending further evaluation -please plan to keep Monday till afternoon and notify our id pharmacy team to arrange antibiotics on hands  -id clinic f/u 5/1 @ 930 -supportive care (warm compression/antiinflammatory for lymphadenitis) -maintain contact isolation precaution -discussed with dr Leafy Half    #hap/vap #question of rash with cefepime here during admission 3/16 sputum cx with PsA (S cefepime/ceftaz,cipro, imipenem);  and mrsa 3/23 repeat sputum cx pseudomonas resistant to cipro/imipenem patient takes amox previously -- our pharmacy team confirmed with his mother Switched cipro to piptazo 3/25 but fever curve was already better 3/31 on 2 liters o2; minimal to no cough; no dyspnea 4/04 off o2. No cough. S/p 7 day piptazo by 3/31        Principal Problem:   MRSA bacteremia Active Problems:   Severe sepsis (HCC)   Septic embolism (HCC)   Malnutrition of moderate degree   Acute respiratory failure with hypoxia (HCC)   Septic discitis of lumbar region   IVDU (intravenous drug user)   Critical illness myopathy   Sinus tachycardia   Acute deep vein thrombosis (DVT) of iliac vein of both lower extremities (HCC)   Allergies  Allergen Reactions   Cefepime Rash    Scheduled Meds:  apixaban  5 mg Oral BID   carvedilol  6.25 mg Oral BID WC   Chlorhexidine Gluconate Cloth  6 each Topical Daily   chlorpheniramine-HYDROcodone  5 mL Oral Q12H   clonazePAM  1 mg Oral BID   feeding supplement  237 mL Oral TID BM   guaiFENesin  20 mL Oral BID   lidocaine  1 patch Transdermal Q24H   multivitamin with minerals  1 tablet Oral Daily   oxyCODONE  10 mg Oral QID   pantoprazole  40 mg Oral Daily   polyethylene glycol  17 g Oral Daily   senna-docusate  2 tablet Oral BID   sodium chloride flush  10-40 mL Intracatheter Q12H   thiamine  100 mg Oral Daily   Continuous Infusions:  vancomycin 500 mg (07/11/23 0901)   PRN Meds:.acetaminophen (TYLENOL) oral liquid  160 mg/5 mL, acetaminophen, albuterol, bisacodyl, ibuprofen, lidocaine, methocarbamol, metoprolol tartrate, mouth rinse, phenol, sodium chloride flush   SUBJECTIVE: Oob daily Feeling better  Off o2 support No n/v/diarrhea Right neck tender lymph node afebrile   Review of Systems: ROS All other ROS was negative, except mentioned above     OBJECTIVE: Vitals:   07/10/23 2001 07/11/23 0500 07/11/23 0518 07/11/23 0851  BP: 120/73  99/70  99/70  Pulse: 97  88 88  Resp: 16  15   Temp: 98.7 F (37.1 C)  98.4 F (36.9 C)   TempSrc: Oral  Oral   SpO2: 91%  93%   Weight:  64.4 kg    Height:       Body mass index is 19.8 kg/m.  Physical Exam General/constitutional: no distress, pleasant HEENT: right neck mobile tender 0.5 inch submandibular node; no sign of parotitis CV: rrr no mrg Lungs: normal respiratory effort Abd: Soft, Nontender Ext: no edema Skin: No Rash Neuro: nonfocal -- generalized weakness MSK: no peripheral joint swelling/tenderness/warmth   Central line presence: LUE picc site no erythema/purulence/tenderness   Lab Results Lab Results  Component Value Date   WBC 17.2 (H) 07/11/2023   HGB 7.7 (L) 07/11/2023   HCT 25.4 (L) 07/11/2023   MCV 89.1 07/11/2023   PLT 618 (H) 07/11/2023    Lab Results  Component Value Date   CREATININE 0.92 07/11/2023   BUN 28 (H) 07/11/2023   NA 133 (L) 07/11/2023   K 4.1 07/11/2023   CL 94 (L) 07/11/2023   CO2 28 07/11/2023    Lab Results  Component Value Date   ALT 34 07/11/2023   AST 28 07/11/2023   ALKPHOS 80 07/11/2023   BILITOT 0.3 07/11/2023      Microbiology: No results found for this or any previous visit (from the past 240 hours).    Serology:   Imaging: If present, new imagings (plain films, ct scans, and mri) have been personally visualized and interpreted; radiology reports have been reviewed. Decision making incorporated into the Impression / Recommendations.  06/21/23 mri lumbar spine wwo contrast 1. Abnormal edema and enhancement affecting the L5 vertebral body which could go along with osteomyelitis. The L4-5 and L5-S1 disc spaces are normal. 2. Abnormal appearance of the paravertebral soft tissues surrounding L5 including the epidural space at L5-S1 with edema and enhancement consistent with inflammatory change. The epidural changes are phlegmonous at this point in time without what I would characterize as a frank epidural  abscess.   3/15 mri thoracic spine wwo contrast 1. No evidence of discitis, osteomyelitis or septic facet arthritis. 2. Extensive patchy bilateral pulmonary pathology, not well evaluated using this technique.   3/14 ct abd pelv with contrast 1. Incompletely visualized small to moderate left pneumothorax at the left lung base. 2. Negative for rim enhancing fluid collection within the abdomen or pelvis to suggest drainable abscess. 3. Contracted gallbladder with possible wall thickening but no calcified stones. 4. Persistent bilateral lower lobe consolidations with areas of heterogeneous low density suggestive of necrotic infection. Multiple cavitary lung lesions corresponding to history of septic emboli. Small bilateral pleural effusions. 5. Suspicion of hypodense filling defect within the bilateral common iliac veins right greater than left, suspicious for DVT. 6. Question small volume low-density fluid along the right anterior margins of the L4 and L5 vertebral bodies. No overt osseous destructive change or abnormal appearance of the disc spaces but presence of spine infection cannot be excluded and correlation with MRI should be  considered. 7. Small free fluid within the abdomen and pelvis. Presacral soft tissue stranding and small volume fluid. Mild generalized subcutaneous edema. 8. Slightly thick-walled urinary bladder, decompressed by Foley catheter.    3/13 tee  1. Left ventricular ejection fraction, by estimation, is 45 to 50%. The  left ventricle has mildly decreased function.   2. Right ventricular systolic function is normal. The right ventricular  size is normal.   3. No left atrial/left atrial appendage thrombus was detected.   4. The mitral valve is grossly normal. Trivial mitral valve  regurgitation.   5. The aortic valve is tricuspid. Aortic valve regurgitation is not  visualized.   6. Agitated saline contrast bubble study was positive with shunting   observed within 3-6 cardiac cycles suggestive of interatrial shunt, Grade  1.   Conclusion(s)/Recommendation(s): No evidence of vegetation/infective  endocarditis on this transesophageael echocardiogram.      3/9 ct chest without contrast 1. Very extensive heterogeneous and consolidative airspace disease throughout the lungs, much of this nodular in appearance, with several cavitary nodules present. Findings are highly suspicious for septic emboli given stated clinical concern. 2. Small bilateral pleural effusions. 3. Cardiomegaly.   3/08 tte  1. Left ventricular ejection fraction, by estimation, is 40 to 45%. The  left ventricle has mildly decreased function. The left ventricle  demonstrates global hypokinesis. The left ventricular internal cavity size  was mildly dilated. There is mild left ventricular hypertrophy. Left ventricular diastolic parameters were normal.   2. Right ventricular systolic function is moderately reduced. The right  ventricular size is moderately enlarged. Tricuspid regurgitation signal is  inadequate for assessing PA pressure.   3. The mitral valve is abnormal. Trivial mitral valve regurgitation. No  evidence of mitral stenosis.   4. The aortic valve is tricuspid. Aortic valve regurgitation is not  visualized. No aortic stenosis is present.   5. The inferior vena cava is normal in size with greater than 50%  respiratory variability, suggesting right atrial pressure of 3 mmHg.   6. Agitated saline contrast bubble study was negative, with no evidence  of any interatrial shunt.      Raymondo Band, MD [facility] [phone] pager    07/11/2023, 1:30 PM

## 2023-07-11 NOTE — Assessment & Plan Note (Addendum)
 Identified 3/14 Initially treated with heparin, followed by Lovenox Patient then transitioned to oral Eliquis on 4/3.

## 2023-07-11 NOTE — Assessment & Plan Note (Signed)
 History of polysubstance abuse with heroin and fentanyl as severely limited the patient's ability to be placed in a skilled nursing facility where acute rehabilitation center Counseling on cessation daily

## 2023-07-11 NOTE — Assessment & Plan Note (Signed)
 Reactive secondary to extensive infection  Providing warm compresses and NSAIDs

## 2023-07-11 NOTE — Assessment & Plan Note (Addendum)
 Substantial generalized weakness as a result Strength seems to be slowly improving Difficulty with placement as noted above Physical therapy working with patient daily

## 2023-07-11 NOTE — Progress Notes (Signed)
 Occupational Therapy Treatment Patient Details Name: Brendan Preston MRN: [id number] DOB: 1975/09/12 Today's Date: 07/11/2023   History of present illness 48 yr old male admitted 06/13/23 with AMS, fever, tachycardia, and being hypertensive . Patient intubated 3/13-3/24. PMH: polysubstance abuse   OT comments  The pt reported feelings of generalized soreness, as well as significant back and ribs pain. He required supervision for lower body dressing/donning socks in bed, then CGA to stand and ambulate using a RW. He also reported feeling stronger than he did several days ago. He is making gradual functional progress. Continue OT plan of care. Home health therapy is recommended.       If plan is discharge home, recommend the following:  A little help with walking and/or transfers;A little help with bathing/dressing/bathroom;Assistance with cooking/housework   Equipment Recommendations  Tub/shower seat    Recommendations for Other Services      Precautions / Restrictions Precautions Precautions: Fall Restrictions Weight Bearing Restrictions Per Provider Order: No Other Position/Activity Restrictions: Contact precautions       Mobility Bed Mobility Overal bed mobility: Needs Assistance Bed Mobility: Supine to Sit     Supine to sit: Supervision, Used rails Sit to supine: Supervision        Transfers Overall transfer level: Needs assistance Equipment used: Rolling walker (2 wheels) Transfers: Sit to/from Stand Sit to Stand: Contact guard assist        Balance     Sitting balance-Leahy Scale: Good         Standing balance comment: CGA with RW         ADL either performed or assessed with clinical judgement   ADL Overall ADL's : Needs assistance/impaired Eating/Feeding: Independent                   Lower Body Dressing: Supervision/safety;Set up Lower Body Dressing Details (indicate cue type and reason): He demonstrated partial long-sitting and flexing  his knees, in order to donn both socks at bed level.                    Communication Communication Communication: No apparent difficulties   Cognition Arousal: Alert Behavior During Therapy: Flat affect Cognition: No apparent impairments          Following commands: Intact        Cueing                 Pertinent Vitals/ Pain       Pain Assessment Pain Assessment: 0-10 Pain Score: 7  Pain Location: back and ribs Pain Intervention(s): Patient requesting pain meds-RN notified, Monitored during session   Frequency  Min 2X/week        Progress Toward Goals  OT Goals(current goals can now be found in the care plan section)  Progress towards OT goals: Progressing toward goals  Acute Rehab OT Goals OT Goal Formulation: With patient Potential to Achieve Goals: Good  Plan         AM-PAC OT "6 Clicks" Daily Activity     Outcome Measure   Help from another person eating meals?: None Help from another person taking care of personal grooming?: A Little Help from another person toileting, which includes using toliet, bedpan, or urinal?: A Little Help from another person bathing (including washing, rinsing, drying)?: A Lot Help from another person to put on and taking off regular upper body clothing?: A Little Help from another person to put on and taking off regular lower body clothing?: A Little  6 Click Score: 18    End of Session Equipment Utilized During Treatment: Rolling walker (2 wheels);Gait belt  OT Visit Diagnosis: Pain;Muscle weakness (generalized) (M62.81);Unsteadiness on feet (R26.81) Pain - part of body:  (back and ribs)   Activity Tolerance Patient tolerated treatment well   Patient Left in bed;with call bell/phone within reach;with bed alarm set   Nurse Communication Patient requests pain meds        Time: 5284-1324 OT Time Calculation (min): 16 min  Charges: OT General Charges $OT Visit: 1 Visit OT Treatments $Therapeutic  Activity: 8-22 mins    Reuben Likes, OTR/L 07/11/2023, 3:31 PM

## 2023-07-11 NOTE — Plan of Care (Signed)

## 2023-07-11 NOTE — TOC Progression Note (Addendum)
 Transition of Care [facility]) - Progression Note    Patient Details  Name: Brendan Preston MRN: [id number] Date of Birth: 11-04-1975  [facility] CM/SW Contact  Otelia Santee, LCSW Phone Number: 07/11/2023, 9:33 AM  Clinical Narrative:    Delivered RW to pt's room for pt to take with him at DC.  Spoke with pt's mother who reports that they lives in [place], [place] which is around 20 miles outside of [place]. Pt's mother reports that there are no infectious disease providers in their town and she is unaware of clinics for people who are uninsured. Pt's mother states that they will be unable to take him to a primary care provider if they are going to get a bill or have to pay out of pocket as they do not have the income to afford this.  CSW located the [facility] in [place] however, unable to make a follow up appointment for pt as he has to complete an application and an in-person assessment. Information of how to complete this has been placed on AVS. Public Health Department information also placed on AVS.  TOC will continue to seek additional resources.      Barriers to Discharge: Continued Medical Work up  Expected Discharge Plan and Services     Post Acute Care Choice: NA Living arrangements for the past 2 months: Single Family Home                   DME Agency: NA                   Social Determinants of Health (SDOH) Interventions SDOH Screenings   Food Insecurity: Patient Declined (06/15/2023)  Housing: Patient Unable To Answer (06/29/2023)  Transportation Needs: Patient Unable To Answer (06/16/2023)  Utilities: Patient Unable To Answer (06/16/2023)  Tobacco Use: High Risk (06/29/2023)    Readmission Risk Interventions    07/09/2023   10:22 AM 06/14/2023    8:46 AM  Readmission Risk Prevention Plan  Post Dischage Appt  Complete  Medication Screening  Complete  Transportation Screening Complete Complete  PCP or Specialist Appt within 5-7 Days  Complete   Home Care Screening Complete   Medication Review (RN CM) Complete

## 2023-07-12 LAB — COMPREHENSIVE METABOLIC PANEL WITH GFR
ALT: 33 U/L (ref 0–44)
AST: 27 U/L (ref 15–41)
Albumin: 2.1 g/dL — ABNORMAL LOW (ref 3.5–5.0)
Alkaline Phosphatase: 78 U/L (ref 38–126)
Anion gap: 8 (ref 5–15)
BUN: 31 mg/dL — ABNORMAL HIGH (ref 6–20)
CO2: 29 mmol/L (ref 22–32)
Calcium: 8.9 mg/dL (ref 8.9–10.3)
Chloride: 97 mmol/L — ABNORMAL LOW (ref 98–111)
Creatinine, Ser: 1.07 mg/dL (ref 0.61–1.24)
GFR, Estimated: >60 mL/min (ref >60)
Glucose, Bld: 113 mg/dL — ABNORMAL HIGH (ref 70–99)
Potassium: 4.2 mmol/L (ref 3.5–5.1)
Sodium: 134 mmol/L — ABNORMAL LOW (ref 135–145)
Total Bilirubin: 0.2 mg/dL (ref 0.0–1.2)
Total Protein: 8.9 g/dL — ABNORMAL HIGH (ref 6.5–8.1)

## 2023-07-12 LAB — CBC WITH DIFFERENTIAL/PLATELET
Abs Immature Granulocytes: 0.2 10*3/uL — ABNORMAL HIGH (ref 0.00–0.07)
Basophils Absolute: 0 10*3/uL (ref 0.0–0.1)
Basophils Relative: 0 %
Eosinophils Absolute: 0.1 10*3/uL (ref 0.0–0.5)
Eosinophils Relative: 1 %
HCT: 25.8 % — ABNORMAL LOW (ref 39.0–52.0)
Hemoglobin: 7.8 g/dL — ABNORMAL LOW (ref 13.0–17.0)
Immature Granulocytes: 1 %
Lymphocytes Relative: 17 %
Lymphs Abs: 2.7 10*3/uL (ref 0.7–4.0)
MCH: 26.9 pg (ref 26.0–34.0)
MCHC: 30.2 g/dL (ref 30.0–36.0)
MCV: 89 fL (ref 80.0–100.0)
Monocytes Absolute: 1.2 10*3/uL — ABNORMAL HIGH (ref 0.1–1.0)
Monocytes Relative: 7 %
Neutro Abs: 11.6 10*3/uL — ABNORMAL HIGH (ref 1.7–7.7)
Neutrophils Relative %: 74 %
Platelets: 561 10*3/uL — ABNORMAL HIGH (ref 150–400)
RBC: 2.9 MIL/uL — ABNORMAL LOW (ref 4.22–5.81)
RDW: 15.8 % — ABNORMAL HIGH (ref 11.5–15.5)
WBC: 15.8 10*3/uL — ABNORMAL HIGH (ref 4.0–10.5)
nRBC: 0 % (ref 0.0–0.2)

## 2023-07-12 LAB — MAGNESIUM: Magnesium: 2.2 mg/dL (ref 1.7–2.4)

## 2023-07-12 NOTE — Assessment & Plan Note (Signed)
 Complicated course earlier in the hospitalization requiring endotracheal intubation 3/11  Clinical course complicated by presence of left-sided pneumothorax status post left pigtail chest tube placement which has since been removed Clinical course additionally complicated by multifocal bilateral pneumonia Status post extubation 3/24

## 2023-07-12 NOTE — Progress Notes (Signed)
 Mobility Specialist - Progress Note   07/12/23 1023  Mobility  Activity Ambulated with assistance in hallway  Level of Assistance Contact guard assist, steadying assist  Assistive Device Front wheel walker  Distance Ambulated (ft) 100 ft  Range of Motion/Exercises Active  Activity Response Tolerated well  Mobility Referral Yes  Mobility visit 1 Mobility  Mobility Specialist Start Time (ACUTE ONLY) 1010  Mobility Specialist Stop Time (ACUTE ONLY) 1023  Mobility Specialist Time Calculation (min) (ACUTE ONLY) 13 min   Pt was found in bed and agreeable to ambulate. Stated feeling sore. At EOS returned to bed with all needs met. Call bell in reach and bed alarm on.  Billey Chang Mobility Specialist

## 2023-07-12 NOTE — Assessment & Plan Note (Addendum)
 White blood cell count and thrombocytosis continuing to improve  Zosyn completed 07/07/2023. Patient now receiving intravenous vancomycin to be completed 07/13/2023 Patient then to be transitioned to oral linezolid for total of 2 weeks followed by 2 weeks of oral doxycycline. Infectious disease is following, their assistance with this case is greatly appreciated. Blood culture 3/7 for MRSA.  Blood cultures on 3/9 documented clearance.

## 2023-07-12 NOTE — Assessment & Plan Note (Signed)
Severe sepsis resolved

## 2023-07-12 NOTE — Assessment & Plan Note (Addendum)
 Identified 3/14 Initially treated with heparin, followed by Lovenox Patient then transitioned to oral Eliquis on 4/3. Patient will require a minimum of 3 months

## 2023-07-12 NOTE — Progress Notes (Signed)
 PROGRESS NOTE   Brendan Preston  ZOX:[id number] DOB: 1975-07-19 DOA: 06/13/2023 PCP: Patient, No Pcp Per   Date of Service: the patient was seen and examined on 07/12/2023  Brief Narrative:  HPI: 8 with past medical history of known polysubstance (heroin/ fentanyl) abuse in which EMS was called out for AMS found down covered in urine and feces brought into [facility] emergency department for evaluation.  Upon evaluation in the emergency department patient was found to have a fever of 104.3 rectal and other SIRS criteria.  Patient was felt to be suffering from severe sepsis and patient was initially admitted to the [facility] service on 3/7.    Early hospitalization was complicated by drug rash thought to be secondary to cefepime which resolved with adjusting agents.  Early hospital course was also complicated by a fall out of bed on 3/10 resulting in nasal bone fracture.  Early workup revealed MRSA bacteremia on 3/7 blood cultures.  TTE negative for endocarditis.  TEE was initially not performed due to patient refusal.  Patient clinically decompensated and underwent endotracheal intubation on 3/11.  On 3/14 CT imaging of the abdomen revealed a left pneumothorax with bilateral lower lobe consolidation and multiple cavitary lesions and concern for bilateral common iliac DVT.  A left pigtail chest tube was placed for the pneumothorax.  Patient was initiated on intravenous heparin which was later changed to Lovenox on 3/17.  Patient also had additional continued infectious complications including tracheal secretions growing out Pseudomonas with concerning findings for concurrent Pseudomonas pneumonia.  Patient was treated with intravenous meropenem.  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  Infectious ease consultation was obtained and with  infectious disease guidance recommendation was for patient to ultimately be treated with a total of 8 weeks of antibiotics.  A total of 4 weeks of intravenous antibiotics to be completed on 4/6.  2 weeks of linezolid by mouth until 4/30 and then 2 additional weeks of oral doxycycline thereafter.  Respiratory wise, patient clinically improved and was extubated on 3/24.  Patient was transferred to the hospital service on 3/26.  PICC line was removed on 3/28.       Assessment & Plan MRSA bacteremia White blood cell count and thrombocytosis continuing to improve  Zosyn completed 07/07/2023. Patient now receiving intravenous vancomycin to be completed 07/13/2023 Patient then to be transitioned to oral linezolid for total of 2 weeks followed by 2 weeks of oral doxycycline. Infectious disease is following, their assistance with this case is greatly appreciated. Blood culture 3/7 for MRSA.  Blood cultures on 3/9 documented clearance. 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 Patient will require a minimum of 3 months Lymphadenopathy, submandibular Reactive secondary to extensive infection  Providing warm compresses and NSAIDs Swelling seems to have improved today. IVDU (intravenous drug user) History of polysubstance abuse with heroin and fentanyl as severely  limited the patient's  ability to be placed in a skilled nursing facility or acute rehabilitation center Counseling on cessation daily Critical illness myopathy Slowly increasing strength and stamina, patient has walked 100 feet today.   Substantial generalized weakness as a result Strength seems to be slowly improving Difficulty with placement as noted above Physical therapy working with patient daily Severe sepsis (HCC) Severe sepsis resolved Malnutrition of moderate degree Albumin slowly increasing  nutrition following their input appreciated Continue thiamine, nutritional supplements    Subjective:  Patient reports that he continues to feel slow improvement in his generalized weakness.  Patient reports that his submandibular lymphadenopathy seems of improved since yesterday with the warm compresses.    Physical Exam:  Vitals:   07/11/23 2003 07/12/23 0514 07/12/23 0708 07/12/23 0848  BP: 107/62 108/66  108/66  Pulse: 98 75  75  Resp: 20 15    Temp: 99.2 F (37.3 C) (!) 97.5 F (36.4 C)    TempSrc: Oral     SpO2: 98% 92%    Weight:   60.3 kg   Height:        Constitutional: Awake alert and oriented x3, no associated distress.  Patient is cachectic Skin:  tender swelling in the right submandibular region secondary to lymphadenopathy is somewhat improved compared to yesterday.  Otherwise, good skin turgor noted.   Eyes: Pupils are equally reactive to light.  No evidence of scleral icterus or conjunctival pallor.  ENMT: Temporal wasting noted, moist mucous membranes noted.  Posterior pharynx clear of any exudate or lesions.   Respiratory: clear to auscultation bilaterally, no wheezing, no crackles. Normal respiratory effort. No accessory muscle use.  Cardiovascular: Regular rate and rhythm, no murmurs / rubs / gallops. No extremity edema. 2+ pedal pulses. No carotid bruits.  Abdomen: Abdomen is soft and nontender.  No evidence of intra-abdominal masses.  Positive bowel sounds noted in all  quadrants.   Musculoskeletal: No joint deformity upper and lower extremities. Good ROM, no contractures.  poor muscle tone.   Data Reviewed:  I have personally reviewed and interpreted labs, imaging.  Significant findings are   CBC: Recent Labs  Lab 07/08/23 0442 07/09/23 0408 07/11/23 0328 07/12/23 0445  WBC 20.2* 19.3* 17.2* 15.8*  NEUTROABS  --  14.0*  --  11.6*  HGB 8.2* 8.1* 7.7* 7.8*  HCT 26.3* 25.7* 25.4* 25.8*  MCV 87.7 87.4 89.1 89.0  PLT 764* 737* 618* 561*   Basic Metabolic Panel: Recent Labs  Lab 07/07/23 0325 07/08/23 0442 07/10/23 0451 07/11/23 0328 07/12/23 0445  NA 132* 133* 132* 133* 134*  K 3.6 3.8 3.7 4.1 4.2  CL 93* 95* 94* 94* 97*  CO2 28 28 29 28 29   GLUCOSE 124* 121* 123* 123* 113*  BUN 24* 28* 25* 28* 31*  CREATININE 0.92 1.11 0.96 0.92 1.07  CALCIUM 8.6* 8.7* 8.8* 8.8* 8.9  MG  --   --   --  2.1 2.2   GFR: Estimated Creatinine Clearance: 71.2 mL/min (by C-G formula based on SCr of 1.07 mg/dL). Liver Function Tests: Recent Labs  Lab 07/08/23 0442 07/10/23 0451 07/11/23 0328 07/12/23 0445  AST 29 26 28 27   ALT 45* 37 34 33  ALKPHOS 94 84 80 78  BILITOT 0.3 0.5 0.3 0.2  PROT 8.9* 9.1* 9.1* 8.9*  ALBUMIN 2.0* 2.0* 2.1* 2.1*    Code Status:  Full code.  Code status decision has been confirmed with: patient Family Communication: Plan of care discussed with mother via  phone conversation.   Severity of Illness:  The appropriate patient status for this patient is INPATIENT. Inpatient status is judged to be reasonable and necessary in order to provide the required intensity of service to ensure the patient's safety. The patient's presenting symptoms, physical exam findings, and initial radiographic and laboratory data in the context of their chronic comorbidities is felt to place them at high risk for further clinical deterioration. Furthermore, it is not anticipated that the patient will be medically stable for discharge from the  hospital within 2 midnights of admission.   * I certify that at the point of admission it is my clinical judgment that the patient will require inpatient hospital care spanning beyond 2 midnights from the point of admission due to high intensity of service, high risk for further deterioration and high frequency of surveillance required.*  Time spent:  49 minutes  Author:  Marinda Elk MD  07/12/2023 9:01 AM

## 2023-07-12 NOTE — Assessment & Plan Note (Signed)
 Covered by intravenous antibiotic therapy as noted above

## 2023-07-12 NOTE — Assessment & Plan Note (Addendum)
 Reactive secondary to extensive infection  Providing warm compresses and NSAIDs Swelling seems to have improved today.

## 2023-07-12 NOTE — Assessment & Plan Note (Addendum)
 Slowly increasing strength and stamina, patient has walked 100 feet today.   Substantial generalized weakness as a result Strength seems to be slowly improving Difficulty with placement as noted above Physical therapy working with patient daily

## 2023-07-12 NOTE — Assessment & Plan Note (Addendum)
 Albumin slowly increasing  nutrition following their input appreciated Continue thiamine, nutritional supplements

## 2023-07-12 NOTE — Plan of Care (Signed)

## 2023-07-12 NOTE — Assessment & Plan Note (Addendum)
 History of polysubstance abuse with heroin and fentanyl as severely limited the patient's ability to be placed in a skilled nursing facility or acute rehabilitation center Counseling on cessation daily

## 2023-07-13 MED ORDER — LINEZOLID 600 MG PO TABS
600.0000 mg | ORAL_TABLET | Freq: Two times a day (BID) | ORAL | Status: DC
  Administered 2023-07-14: 600 mg via ORAL
  Filled 2023-07-13 (×2): qty 1

## 2023-07-13 NOTE — Assessment & Plan Note (Addendum)
 Complicated course earlier in the hospitalization requiring endotracheal intubation 3/11  Clinical course complicated by presence of left-sided pneumothorax status post left pigtail chest tube placement which has since been removed Clinical course additionally complicated by multifocal bilateral pneumonia Status post extubation 3/24

## 2023-07-13 NOTE — Assessment & Plan Note (Addendum)
 Covered by intravenous antibiotic therapy as noted above

## 2023-07-13 NOTE — Plan of Care (Signed)
   Problem: Nutrition: Goal: Adequate nutrition will be maintained Outcome: Progressing   Problem: Pain Managment: Goal: General experience of comfort will improve and/or be controlled Outcome: Progressing   Problem: Safety: Goal: Ability to remain free from injury will improve Outcome: Progressing

## 2023-07-13 NOTE — Assessment & Plan Note (Addendum)
 History of polysubstance abuse with heroin and fentanyl as severely limited the patient's ability to be placed in a skilled nursing facility or acute rehabilitation center Counseling on cessation daily

## 2023-07-13 NOTE — Progress Notes (Addendum)
 PROGRESS NOTE   Brendan Preston  ZDG:[id number] DOB: 12/01/1975 DOA: 06/13/2023 PCP: Patient, No Pcp Per   Date of Service: the patient was seen and examined on 07/13/2023  Brief Narrative:  47 with past medical history of known polysubstance (heroin/ fentanyl) abuse in which EMS was called out for AMS found down covered in urine and feces brought into [facility] emergency department for evaluation.  Upon evaluation in the emergency department patient was found to have a fever of 104.3 rectal and other SIRS criteria.  Patient was felt to be suffering from severe sepsis and patient was initially admitted to the [facility] service on 3/7.    Patient was placed on broad-spectrum antibiotics initially however Early workup revealed MRSA bacteremia on 3/7 blood cultures and antibiotics were adjusted.  TTE negative for endocarditis.  TEE was initially not performed due to patient refusal.    Early hospitalization was complicated by drug rash thought to be secondary to cefepime which resolved with adjusting agents.  Early hospital course was also complicated by a fall out of bed on 3/10 resulting in nasal bone fracture.  In the following days patient clinically decompensated and underwent endotracheal intubation on 3/11.  On 3/14 CT imaging of the abdomen revealed a left pneumothorax with bilateral lower lobe consolidation and multiple cavitary lesions and concern for bilateral common iliac DVT.  A left pigtail chest tube was placed for the pneumothorax.  Patient was initiated on intravenous heparin which was later changed to Lovenox on 3/17.  Patient also had additional continued infectious complications including tracheal secretions growing out Pseudomonas with concerning findings for concurrent Pseudomonas pneumonia.  Patient was treated with intravenous meropenem.  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  On 3/15 MRI of the lumbar spine was performed  revealing concerning findings for osteomyelitis of the spine.  Infectious ease consultation was obtained.    Respiratory wise, patient clinically improved and was extubated on 3/24.  Patient was transferred to the hospitalist service on 3/26.    While in the hospitalist service the patient continued to slowly clinically improve.  Patient was found to have profound generalized weakness secondary to critical illness myopathy.  Unfortunately, due to the patient's history of substance abuse our [facility] team has been unable to find inpatient or skilled facility rehab options for this patient.  Physical therapy teams have been intensely working with the patient daily throughout the patient stay on the medical floor.  Furthermore patient also suffered from protein calorie malnutrition as a result of prolonged poor oral intake.  Nutrition has been following and patient has been provided with a series of nutritional supplements and vitamins with continue to encourage oral intake.  With infectious disease guidance the recommendation was for patient to ultimately be treated with a total of 8 weeks of antibiotics.  Patient's course of intravenous Zosyn was completed on 07/07/2023.  Patient was continued on intravenous vancomycin scheduled to be completed in 07/13/2023.  This is to be followed by 2 weeks of linezolid by mouth until 4/30 and then 2 additional weeks of oral doxycycline thereafter.     Assessment & Plan MRSA bacteremia White blood cell count and thrombocytosis continuing to improve  Zosyn completed 07/07/2023. Patient now receiving intravenous vancomycin to be completed 07/13/2023 Patient then to be transitioned to oral linezolid for total of 2 weeks followed by 2 weeks of oral doxycycline.  Will work with TOC on date of discharge via match program to have  entire course of antibiotics and other necessary medications filled and delivered to the bedside prior to discharge. Order written for PICC line to be  removed morning of 4/7. Infectious disease is following, their assistance with this case is greatly appreciated. Blood culture 3/7 for MRSA.  Blood cultures on 3/9 documented clearance. 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 Patient will require a minimum of 3 months Lymphadenopathy, submandibular Reactive secondary to extensive infection  Providing warm compresses and NSAIDs Swelling seems to have improved today. IVDU (intravenous drug user) History of polysubstance abuse with heroin and fentanyl as severely limited the patient's ability to be placed in a skilled nursing facility or acute rehabilitation center Counseling on cessation daily Critical illness myopathy Slowly increasing strength and stamina, patient has had substantial improvement in mobility walking over 300 feet today. Strength seems to be slowly improving Difficulty with placement as noted above Physical therapy working with patient daily Severe sepsis (HCC) Severe sepsis resolved Malnutrition of moderate degree Albumin slowly increasing  nutrition following their input appreciated Continue thiamine, nutritional supplements    Subjective:  Patient reports that he continues to feel slow improvement in his generalized weakness.  Patient reports that his right submandibular lymphadenopathy also seems  to be continuing to improve with warm compresses.   Physical Exam:  Vitals:   07/13/23 0846 07/13/23 1304 07/13/23 1628 07/13/23 1910  BP: 107/70 104/65 107/68 126/80  Pulse: 85 82 84 98  Resp:   18 17  Temp:   (!) 97.5 F (36.4 C) 98.2 F (36.8 C)  TempSrc:   Oral   SpO2:  99%  98%  Weight:      Height:        Constitutional: Awake alert and oriented x3, no associated distress.  Patient is cachectic Skin:  tender swelling in the right submandibular region secondary to lymphadenopathy is somewhat improved compared to yesterday.  Otherwise, good skin turgor noted.   Eyes: Pupils are equally reactive to light.  No evidence of scleral icterus or conjunctival pallor.  ENMT: Temporal wasting noted, moist mucous membranes noted.  Posterior pharynx clear of any exudate or lesions.   Respiratory: clear to auscultation bilaterally, no wheezing, no crackles. Normal respiratory effort. No accessory muscle use.  Cardiovascular: Regular rate and rhythm, no murmurs / rubs / gallops. No extremity edema. 2+ pedal pulses. No carotid bruits.  Abdomen: Abdomen is soft and nontender.  No evidence of intra-abdominal masses.  Positive bowel sounds noted in all quadrants.   Musculoskeletal: No joint deformity upper and lower extremities. Good ROM, no contractures.  poor muscle tone.   Data Reviewed:  I have personally reviewed and interpreted labs, imaging.  Significant findings are   CBC: Recent Labs  Lab 07/08/23 0442 07/09/23 0408 07/11/23 0328 07/12/23 0445  WBC 20.2* 19.3* 17.2* 15.8*  NEUTROABS  --  14.0*  --  11.6*  HGB 8.2* 8.1* 7.7* 7.8*  HCT 26.3* 25.7* 25.4* 25.8*  MCV 87.7 87.4 89.1 89.0  PLT 764* 737* 618*  561*   Basic Metabolic Panel: Recent Labs  Lab 07/07/23 0325 07/08/23 0442 07/10/23 0451 07/11/23 0328 07/12/23 0445  NA 132* 133* 132* 133* 134*  K 3.6 3.8 3.7 4.1 4.2  CL 93* 95* 94* 94* 97*  CO2 28 28 29 28 29   GLUCOSE 124* 121* 123* 123* 113*  BUN 24* 28* 25*  28* 31*  CREATININE 0.92 1.11 0.96 0.92 1.07  CALCIUM 8.6* 8.7* 8.8* 8.8* 8.9  MG  --   --   --  2.1 2.2   GFR: Estimated Creatinine Clearance: 72.2 mL/min (by C-G formula based on SCr of 1.07 mg/dL). Liver Function Tests: Recent Labs  Lab 07/08/23 0442 07/10/23 0451 07/11/23 0328 07/12/23 0445  AST 29 26 28 27   ALT 45* 37 34 33  ALKPHOS 94 84 80 78  BILITOT 0.3 0.5 0.3 0.2  PROT 8.9* 9.1* 9.1* 8.9*  ALBUMIN 2.0* 2.0* 2.1* 2.1*    Code Status:  Full code.  Code status decision has been confirmed with: patient Family Communication: Plan of care discussed with mother and father via phone conversation 4/5.   Severity of Illness:  The appropriate patient status for this patient is INPATIENT. Inpatient status is judged to be reasonable and necessary in order to provide the required intensity of service to ensure the patient's safety. The patient's presenting symptoms, physical exam findings, and initial radiographic and laboratory data in the context of their chronic comorbidities is felt to place them at high risk for further clinical deterioration. Furthermore, it is not anticipated that the patient will be medically stable for discharge from the hospital within 2 midnights of admission.   * I certify that at the point of admission it is my clinical judgment that the patient will require inpatient hospital care spanning beyond 2 midnights from the point of admission due to high intensity of service, high risk for further deterioration and high frequency of surveillance required.*  Time spent:  41 minutes  Author:  Marinda Elk MD  07/13/2023 7:52 PM

## 2023-07-13 NOTE — Plan of Care (Signed)

## 2023-07-13 NOTE — Assessment & Plan Note (Addendum)
 White blood cell count and thrombocytosis continuing to improve  Zosyn completed 07/07/2023. Patient now receiving intravenous vancomycin to be completed 07/13/2023 Patient then to be transitioned to oral linezolid for total of 2 weeks followed by 2 weeks of oral doxycycline.  Will work with TOC on date of discharge via match program to have entire course of antibiotics and other necessary medications filled and delivered to the bedside prior to discharge. Order written for PICC line to be removed morning of 4/7. Infectious disease is following, their assistance with this case is greatly appreciated. Blood culture 3/7 for MRSA.  Blood cultures on 3/9 documented clearance.

## 2023-07-13 NOTE — Assessment & Plan Note (Addendum)
 Identified 3/14 Initially treated with heparin, followed by Lovenox Patient then transitioned to oral Eliquis on 4/3. Patient will require a minimum of 3 months

## 2023-07-13 NOTE — Assessment & Plan Note (Addendum)
Severe sepsis resolved

## 2023-07-13 NOTE — Assessment & Plan Note (Addendum)
 Reactive secondary to extensive infection  Providing warm compresses and NSAIDs Swelling seems to have improved today.

## 2023-07-13 NOTE — Progress Notes (Signed)
 Occupational Therapy Treatment Patient Details Name: Brendan Preston MRN: [id number] DOB: [birth year] Today's Date: 07/13/2023   History of present illness 48 yo male admitted 06/13/23 with AMS, fever, tachycardic, normo to slightly hypertensive . Patient intubated 3/13-3/24. PMH: polysubstance abuse   OT comments  Pt reports he feels ready to discharge home with his parents in [place] tomorrow. Continues be generally weak with decreased activity tolerance. Recommended seated showering for safety. Educated in fall prevention, transporting items safely with RW and energy conservation strategies. Pt requires set up to [facility] for ADLs and increased time.       If plan is discharge home, recommend the following:  A little help with walking and/or transfers;A little help with bathing/dressing/bathroom;Assistance with cooking/housework   Equipment Recommendations  Tub/shower seat    Recommendations for Other Services      Precautions / Restrictions Precautions Precautions: Fall;Other (comment) Precaution/Restrictions Comments: contact Restrictions Weight Bearing Restrictions Per Provider Order: No       Mobility Bed Mobility Overal bed mobility: Needs Assistance Bed Mobility: Supine to Sit, Sit to Supine     Supine to sit: Supervision, Used rails Sit to supine: Supervision        Transfers Overall transfer level: Needs assistance Equipment used: Rolling walker (2 wheels) Transfers: Sit to/from Stand Sit to Stand: Supervision                 Balance     Sitting balance-Leahy Scale: Good     Standing balance support: Bilateral upper extremity supported Standing balance-Leahy Scale: Poor Standing balance comment: can release walker in static standing                           ADL either performed or assessed with clinical judgement   ADL   Eating/Feeding: Independent   Grooming: Wash/dry hands;Supervision/safety;Standing           Upper Body  Dressing : Set up;Sitting   Lower Body Dressing: Set up;Sitting/lateral leans                      Extremity/Trunk Assessment              Vision       Perception     Praxis     Communication Communication Communication: No apparent difficulties Factors Affecting Communication:  (low volume)   Cognition Arousal: Alert Behavior During Therapy: Flat affect Cognition: No apparent impairments                               Following commands: Intact        Cueing   Cueing Techniques: Verbal cues  Exercises      Shoulder Instructions       General Comments Recommended seated showering for safety and energy conservation.    Pertinent Vitals/ Pain       Pain Assessment Pain Assessment: Faces Faces Pain Scale: Hurts little more Pain Location: back and ribs Pain Descriptors / Indicators: Discomfort, Sore Pain Intervention(s): Monitored during session  Home Living                                          Prior Functioning/Environment              Frequency  Min 2X/week  Progress Toward Goals  OT Goals(current goals can now be found in the care plan section)  Progress towards OT goals: Progressing toward goals  Acute Rehab OT Goals OT Goal Formulation: With patient Potential to Achieve Goals: Good  Plan      Co-evaluation                 AM-PAC OT "6 Clicks" Daily Activity     Outcome Measure   Help from another person eating meals?: None Help from another person taking care of personal grooming?: A Little Help from another person toileting, which includes using toliet, bedpan, or urinal?: A Little Help from another person bathing (including washing, rinsing, drying)?: A Little Help from another person to put on and taking off regular upper body clothing?: A Little Help from another person to put on and taking off regular lower body clothing?: A Little 6 Click Score: 19    End of  Session Equipment Utilized During Treatment: Gait belt;Rolling walker (2 wheels)  OT Visit Diagnosis: Other abnormalities of gait and mobility (R26.89);Pain   Activity Tolerance Patient tolerated treatment well   Patient Left in bed;with call bell/phone within reach;with bed alarm set   Nurse Communication          Time: 1610-9604 OT Time Calculation (min): 16 min  Charges: OT General Charges $OT Visit: 1 Visit OT Treatments $Self Care/Home Management : 8-22 mins  Berna Spare, OTR/L Acute Rehabilitation Services Office: [phone]   Evern Bio 07/13/2023, 4:19 PM

## 2023-07-13 NOTE — Assessment & Plan Note (Addendum)
 Slowly increasing strength and stamina, patient has had substantial improvement in mobility walking over 300 feet today. Strength seems to be slowly improving Difficulty with placement as noted above Physical therapy working with patient daily

## 2023-07-13 NOTE — Progress Notes (Signed)
 Mobility Specialist - Progress Note   07/13/23 0944  Mobility  Activity Ambulated with assistance in hallway  Level of Assistance Contact guard assist, steadying assist  Assistive Device Front wheel walker  Distance Ambulated (ft) 310 ft  Range of Motion/Exercises Active  Activity Response Tolerated well  Mobility Referral Yes  Mobility visit 1 Mobility  Mobility Specialist Start Time (ACUTE ONLY) [id number]  Mobility Specialist Stop Time (ACUTE ONLY) 0944  Mobility Specialist Time Calculation (min) (ACUTE ONLY) 19 min   Pt was found in bed and agreeable to ambulate. Grew fatigued with session. At EOS returned to bed with all needs met. Call bell in reach and bed alarm on.  Billey Chang Mobility Specialist

## 2023-07-13 NOTE — Assessment & Plan Note (Addendum)
 Albumin slowly increasing  nutrition following their input appreciated Continue thiamine, nutritional supplements

## 2023-07-14 ENCOUNTER — Other Ambulatory Visit (HOSPITAL_COMMUNITY): Payer: Self-pay

## 2023-07-14 LAB — CBC WITH DIFFERENTIAL/PLATELET
Abs Immature Granulocytes: 0.13 10*3/uL — ABNORMAL HIGH (ref 0.00–0.07)
Basophils Absolute: 0.1 10*3/uL (ref 0.0–0.1)
Basophils Relative: 0 %
Eosinophils Absolute: 0.1 10*3/uL (ref 0.0–0.5)
Eosinophils Relative: 1 %
HCT: 23 % — ABNORMAL LOW (ref 39.0–52.0)
Hemoglobin: 7.1 g/dL — ABNORMAL LOW (ref 13.0–17.0)
Immature Granulocytes: 1 %
Lymphocytes Relative: 12 %
Lymphs Abs: 1.8 10*3/uL (ref 0.7–4.0)
MCH: 27 pg (ref 26.0–34.0)
MCHC: 30.9 g/dL (ref 30.0–36.0)
MCV: 87.5 fL (ref 80.0–100.0)
Monocytes Absolute: 1 10*3/uL (ref 0.1–1.0)
Monocytes Relative: 6 %
Neutro Abs: 12.5 10*3/uL — ABNORMAL HIGH (ref 1.7–7.7)
Neutrophils Relative %: 80 %
Platelets: 494 10*3/uL — ABNORMAL HIGH (ref 150–400)
RBC: 2.63 MIL/uL — ABNORMAL LOW (ref 4.22–5.81)
RDW: 15.8 % — ABNORMAL HIGH (ref 11.5–15.5)
WBC: 15.5 10*3/uL — ABNORMAL HIGH (ref 4.0–10.5)
nRBC: 0 % (ref 0.0–0.2)

## 2023-07-14 LAB — COMPREHENSIVE METABOLIC PANEL WITH GFR
ALT: 24 U/L (ref 0–44)
AST: 19 U/L (ref 15–41)
Albumin: 2.1 g/dL — ABNORMAL LOW (ref 3.5–5.0)
Alkaline Phosphatase: 73 U/L (ref 38–126)
Anion gap: 10 (ref 5–15)
BUN: 31 mg/dL — ABNORMAL HIGH (ref 6–20)
CO2: 27 mmol/L (ref 22–32)
Calcium: 8.5 mg/dL — ABNORMAL LOW (ref 8.9–10.3)
Chloride: 96 mmol/L — ABNORMAL LOW (ref 98–111)
Creatinine, Ser: 0.94 mg/dL (ref 0.61–1.24)
GFR, Estimated: >60 mL/min (ref >60)
Glucose, Bld: 112 mg/dL — ABNORMAL HIGH (ref 70–99)
Potassium: 4.1 mmol/L (ref 3.5–5.1)
Sodium: 133 mmol/L — ABNORMAL LOW (ref 135–145)
Total Bilirubin: 0.7 mg/dL (ref 0.0–1.2)
Total Protein: 8.5 g/dL — ABNORMAL HIGH (ref 6.5–8.1)

## 2023-07-14 LAB — C-REACTIVE PROTEIN: CRP: 15.8 mg/dL — ABNORMAL HIGH (ref <1.0)

## 2023-07-14 MED ORDER — OXYCODONE HCL 10 MG PO TABS
ORAL_TABLET | ORAL | 0 refills | Status: AC
  Filled 2023-07-14: qty 6, 3d supply, fill #0

## 2023-07-14 MED ORDER — ACETAMINOPHEN 500 MG PO TABS: 1000.0000 mg | ORAL_TABLET | Freq: Four times a day (QID) | ORAL | Status: AC | PRN

## 2023-07-14 MED ORDER — CARVEDILOL 6.25 MG PO TABS
6.2500 mg | ORAL_TABLET | Freq: Two times a day (BID) | ORAL | 0 refills | Status: AC
  Filled 2023-07-14: qty 60, 30d supply, fill #0

## 2023-07-14 MED ORDER — FERROUS SULFATE 325 (65 FE) MG PO TABS
325.0000 mg | ORAL_TABLET | Freq: Two times a day (BID) | ORAL | 0 refills | Status: AC
  Filled 2023-07-14: qty 60, 30d supply, fill #0

## 2023-07-14 MED ORDER — ADULT MULTIVITAMIN W/MINERALS CH
1.0000 | ORAL_TABLET | Freq: Every day | ORAL | 0 refills | Status: AC
  Filled 2023-07-14: qty 30, 30d supply, fill #0

## 2023-07-14 MED ORDER — LINEZOLID 600 MG PO TABS
600.0000 mg | ORAL_TABLET | Freq: Two times a day (BID) | ORAL | 0 refills | Status: AC
  Filled 2023-07-14: qty 28, 14d supply, fill #0

## 2023-07-14 MED ORDER — METHOCARBAMOL 500 MG PO TABS
500.0000 mg | ORAL_TABLET | Freq: Three times a day (TID) | ORAL | 0 refills | Status: AC | PRN
  Filled 2023-07-14: qty 30, 10d supply, fill #0

## 2023-07-14 MED ORDER — PANTOPRAZOLE SODIUM 40 MG PO TBEC
40.0000 mg | DELAYED_RELEASE_TABLET | Freq: Every day | ORAL | 0 refills | Status: AC
  Filled 2023-07-14: qty 30, 30d supply, fill #0

## 2023-07-14 MED ORDER — IBUPROFEN 200 MG PO TABS: 400.0000 mg | ORAL_TABLET | Freq: Four times a day (QID) | ORAL | Status: AC | PRN

## 2023-07-14 MED ORDER — APIXABAN 5 MG PO TABS
5.0000 mg | ORAL_TABLET | Freq: Two times a day (BID) | ORAL | 0 refills | Status: AC
  Filled 2023-07-14: qty 60, 30d supply, fill #0

## 2023-07-14 MED ORDER — DOXYCYCLINE HYCLATE 100 MG PO CAPS
100.0000 mg | ORAL_CAPSULE | Freq: Two times a day (BID) | ORAL | 0 refills | Status: AC
  Filled 2023-07-14: qty 28, 14d supply, fill #0

## 2023-07-14 NOTE — Progress Notes (Signed)
 Patient requested pain medication for home; MD ordered oxycodone and filled by outpatient pharmacy; medications handed to patient's mother and instructions given; information for [facility] given as well; all questions answered;

## 2023-07-14 NOTE — Discharge Summary (Addendum)
 Physician Discharge Summary   Patient: Brendan Preston MRN: [id number] DOB: 05/08/1975  Admit date:     06/13/2023  Discharge date: 07/14/23  Discharge Physician: Jonah Blue   PCP: Patient, No Pcp Per   Recommendations at discharge:   You are being discharged to home in the care of your parents. [facility] (see information below) Take Linezolid twice daily for 14 days and then doxycycline twice daily for 14 days Take Eliquis twice daily until instructed otherwise; do not miss doses Avoid all illicit drugs including heroin and fentanyl  Discharge Diagnoses: Principal Problem:   MRSA bacteremia Active Problems:   Acute respiratory failure with hypoxia (HCC)   Septic discitis of lumbar region   IVDU (intravenous drug user)   Critical illness myopathy   Sinus tachycardia   Severe sepsis (HCC)   Septic embolism (HCC)   Malnutrition of moderate degree   Acute deep vein thrombosis (DVT) of iliac vein of both lower extremities (HCC)   Lymphadenopathy, submandibular   Hospital Course: 47yo with h/o polysubstance (heroin/ fentanyl) abuse who was admitted on 3/7 for AMS, found down.  Admitted to PCCM for severe sepsis, found to have MRSA bacteremia.  TTE negative but TEE eventually positive for tricuspid endocarditis.  Lumbar MRI with osteo, ID consulted.  In-hospital fall with nasal bone fracture.  Decompensated and was intubated 3/11-24.  CT with L PTX with BLL consolidation and multiple cavitary lesions; pigtail chest tube placed.  IV heparin -> Lovenox.  Grew Pseudomonas from trach, treated with IV Meropenem.  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  Nutrition following for malnutrition.  Completed Zosyn on 3/31, Vanc on 4/6.  PICC removed 4/7.  Will dc with Linezolid PO through 4/30 and then 2 weeks of PO Doxy through 5/14.   Assessment and Plan:  MRSA bacteremia/lumbar discitis Improving, severe sepsis resolved Zosyn  completed 07/07/2023, Vancomycin completed 07/13/2023 Patient then transitioned to oral linezolid for total of 2 weeks followed by 2 weeks of oral doxycycline TOC helping via match program to have entire course of antibiotics and other necessary medications filled and delivered to the bedside prior to discharge on 4/7 Order written for PICC line to be removed morning of 4/7 Infectious disease is following, their assistance with this case is greatly appreciated Blood culture 3/7 for MRSA.  Blood cultures on 3/9 documented clearance.  Acute respiratory failure with hypoxia  Complicated course earlier in the hospitalization requiring endotracheal intubation 3/11  Clinical course complicated by presence of left-sided pneumothorax status post left pigtail chest tube placement which has since been removed Clinical course additionally complicated by multifocal bilateral pneumonia Status post extubation 3/24  Acute deep vein thrombosis (DVT) of iliac vein of both lower extremities  Identified 3/14 Initially treated with heparin, followed by Lovenox Patient then transitioned to oral Eliquis on 4/3 Patient will require a minimum of 3 months  Lymphadenopathy, submandibular Reactive secondary to extensive infection  Providing warm compresses and NSAIDs  IVDU (intravenous drug user) History of polysubstance abuse with heroin and fentanyl as severely limited the patient's ability to be placed in a skilled nursing facility or acute rehabilitation center Counseling on cessation daily  Critical illness myopathy Slowly increasing strength and stamina, patient has had substantial improvement in mobility walking over 300 feet today. 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) but will add FeSO4 for now Will need outpatient f/u   Consultants: PCCM ID PT OT TOC team Nutrition  Procedures: LP 3/7 Echocardiogram Intubated 3/11-22 TEE 3/13 Chest tube insertion 3/14-19 Transfused 1 unit PRBC 3/18  Antibiotics: Ceftriaxone x 1 dose Cefepime x 1 dose Meropenem 3/17-19 Zosyn 3/25-4/1 Vancomycin 3/7-4/6  30 Day Unplanned Readmission Risk Score    Flowsheet Row ED to Hosp-Admission (Current) from 06/13/2023 in [facility]  30 Day Unplanned Readmission Risk Score (%) 20.16 Filed at 07/14/2023 0801       This score is the patient's risk of an unplanned readmission within 30 days of being discharged (0 -100%). The score is based on dignosis, age, lab data, medications, orders, and past utilization.   Low:  0-14.9   Medium: 15-21.9   High: 22-29.9   Extreme: 30 and above            Pain control - [place] Controlled Substance Reporting System database was reviewed. and patient was instructed, not to drive, operate heavy machinery, perform activities at heights, swimming or participation in water activities or provide baby-sitting services while on Pain, Sleep and Anxiety Medications; until their outpatient Physician has advised to do so again. Also recommended to not to take more than prescribed Pain, Sleep and Anxiety Medications.    Disposition: Relative's home Diet recommendation:  Regular diet DISCHARGE MEDICATION: Allergies as of 07/14/2023       Reactions   Cefepime Rash        Medication List     TAKE these medications    acetaminophen 500 MG tablet Commonly known as: TYLENOL Take 2 tablets (1,000 mg total) by mouth every 6 (six) hours as needed.   carvedilol 6.25 MG tablet Commonly known as: COREG Take 1 tablet (6.25 mg total)  by mouth 2 (two) times daily with a meal.   CertaVite/Antioxidants Tabs Take 1 tablet by mouth daily. Start taking on: July 15, 2023   doxycycline 100 MG capsule Commonly known as: VIBRAMYCIN Take 1 capsule (100 mg total) by mouth 2 (two) times daily for 14 days. Start taking AFTER completing all of your linezolid antibiotic. Start taking on: July 28, 2023   Eliquis 5 MG Tabs tablet Generic drug: apixaban Take 1 tablet (5 mg total) by mouth 2 (two) times daily.   FeroSul 325 (65 Fe) MG tablet Generic drug: ferrous sulfate Take 1 tablet (325 mg total) by mouth 2 (two) times daily.   ibuprofen 200 MG tablet Commonly known as: Advil Take 2 tablets (400 mg total) by mouth every 6 (six) hours as needed for headache, mild pain (pain score 1-3) or moderate pain (pain score 4-6).   linezolid 600 MG tablet Commonly known as: ZYVOX Take 1 tablet (600 mg total) by mouth every 12 (twelve) hours for 14 days.   methocarbamol 500 MG tablet Commonly known as: ROBAXIN Take 1 tablet (500 mg total) by mouth every 8 (eight) hours as needed for muscle spasms.   Oxycodone HCl 10 MG Tabs Take 1 tablet (10 mg total) by mouth 3 (three) times daily for 1 day, THEN 1 tablet (10 mg total) 2 (two) times daily for 1 day, THEN 1 tablet (10 mg total) daily for 1 day. Start taking on: July 14, 2023   pantoprazole 40 MG  tablet Commonly known as: PROTONIX Take 1 tablet (40 mg total) by mouth daily. Start taking on: July 15, 2023        Follow-up Information     [facility]. Go to.   Why: Once you have completed your application (in discharge packet), you must appear in person with all of your documentation between 3 p.m. and 6 p.m. on Mondays and Wednesdays. No appointment needed, you will be seen on a first come first served basis. The interview can take anywhere from 30 minutes to an hour. Contact information: [address] [place], [place]  [address]  Phone: [phone] Fax: [phone] Email: [email or web address]        [place] [facility] in [place]. Call.   Why: Call to schedule an appointment at your counties public health department Contact information: Main Appointment Line-Please call [phone] to make an appointment for services at any of our locations. [address] [place], [place] [address] [phone] [id number] [phone]        Home health Care Agencies. Call.   Why: 2. [facility] [phone] Quality rating Patient survey rating 3. [facility] Quality rating Patient survey rating 4. [facility] Quality rating Patient survey rating Not available11 5. [facility] Quality rating Not available4 Patient survey rating Not available12 6. [facility] Quality rating Patient survey rating 7. [facility] Quality rating Patient survey rating Not available12 8. [facility] [phone] Quality rating Patient survey rating 9. [facility] Quality rating Patient survey rating 10. [facility] [phone] Quality rating Patient survey rating 11. [facility] [phone] Quality rating Patient survey rating 12. Contact information: Call for home health physical therapy               Discharge Exam:    Subjective: Feeling good, eager to go home.  Does not want blood transfusion, no light-headedness or SOB with ambulation.   Objective: Vitals:   07/14/23 0614 07/14/23 0751  BP: 93/63 101/63  Pulse: 80 76  Resp: 17   Temp: 98.2 F (36.8 C)   SpO2: 96%     Intake/Output Summary (Last 24 hours) at 07/14/2023 1515 Last data filed at 07/14/2023 9924 Gross per 24 hour  Intake 357 ml  Output 1450 ml  Net -1093 ml   Filed Weights   07/12/23 0708 07/13/23 0714 07/14/23 0500  Weight: 60.3 kg 61.1 kg 61.9 kg     Exam:  General:  Appears calm and comfortable and is in NAD Eyes:  EOMI, normal lids, iris ENT:  grossly normal hearing, lips & tongue, mmm Cardiovascular:  RRR, no m/r/g. No LE edema.  Respiratory:   CTA bilaterally with no wheezes/rales/rhonchi.  Normal respiratory effort. Abdomen:  soft, NT, ND Skin:  no rash or induration seen on limited exam Musculoskeletal:  grossly normal tone BUE/BLE, good ROM, no bony abnormality Psychiatric:  blunted mood and affect, speech fluent and appropriate, AOx3 Neurologic:  CN 2-12 grossly intact, moves all extremities in coordinated fashion  Data Reviewed: I have reviewed the patient's lab results since admission.  Pertinent labs for today include:  Na++ 133, not clinically relevant Glucose 112 BUN 31/Creatinine 0.94/GFR >60, stable Albumin 2.1, stable CRP 15.8 WBC 15.5, improving Hgb 7.1, down from 7.8 on 4/5    Condition at discharge: improving  The results of significant diagnostics from this hospitalization (including imaging, microbiology,  ancillary and laboratory) are listed below for reference.   Imaging Studies: DG Chest 2 View Result Date: 07/05/2023 CLINICAL DATA:  Pneumonia. EXAM: CHEST - 2 VIEW COMPARISON:  June 30, 2023. FINDINGS: Stable cardiomediastinal silhouette. Endotracheal tube has been removed. Left-sided PICC line is noted with distal tip in expected position of SVC. Mildly increased bilateral lung opacities are noted concerning for worsening pneumonia. Nodular density is noted in right lung base which may be inflammatory in etiology, although neoplasm cannot be excluded. Bony thorax is unremarkable. IMPRESSION: Mildly increased bilateral lung opacities are noted concerning for worsening multifocal pneumonia. Nodular density is noted in right lung base which may be inflammatory in etiology, but neoplasm cannot be excluded. Electronically Signed   By: Lupita Raider M.D.   On: 07/05/2023 13:52   [place] EKG SITE RITE Result  Date: 07/03/2023 If Site Rite image not attached, placement could not be confirmed due to current cardiac rhythm.  DG CHEST PORT 1 VIEW Result Date: 06/30/2023 CLINICAL DATA:  48 year old male intubated. History of severe sepsis. EXAM: PORTABLE CHEST 1 VIEW COMPARISON:  Portable chest yesterday and earlier. FINDINGS: Portable AP semi upright view at 0430 hours. External enteric tube loops about the thoracic inlet. Internal enteric tube is again visible coursing to the left upper quadrant through the central mediastinum. Endotracheal tube tip is stable, just above the level of the carina similar to yesterday. Right upper extremity approach PICC line remains in place, stable. Other external leads and wires project over the chest. Stable lung volumes but patchy and nodular bilateral pulmonary opacity, dense retrocardiac opacity which has progressed. No pneumothorax or pulmonary edema. No definite pleural effusion. Stable cardiac size and mediastinal contours. Stable visualized osseous structures. Paucity of bowel gas. IMPRESSION: 1. Stable lines and tubes. ETT tip appears to be just above the carina. 2. Ongoing bilateral patchy and nodular airspace disease with increased confluence of left lung base opacity since yesterday. Septic emboli suspected earlier this month. Electronically Signed   By: Odessa Fleming M.D.   On: 06/30/2023 04:59   DG CHEST PORT 1 VIEW Result Date: 06/29/2023 CLINICAL DATA:  Endotracheal tube adjustment. EXAM: PORTABLE CHEST 1 VIEW COMPARISON:  06/29/2023 at 0401 hours and CT chest 06/15/2023. FINDINGS: Endotracheal tube terminates approximately 1.8 cm above the carina. Nasogastric tube is followed into the stomach with the tip projecting beyond the inferior margin of the image. Right PICC tip is in the low SVC. Heart size stable. Patchy and nodular bilateral airspace opacification, unchanged. Trace bilateral pleural effusions. IMPRESSION: 1. Endotracheal tube is somewhat low lying. Retracting  proximally 2 cm should better position the tip above the carina, as clinically indicated. 2. Patchy and nodular airspace opacification in the lungs, possibly due to septic emboli. 3. Trace bilateral pleural effusions. Electronically Signed   By: Leanna Battles M.D.   On: 06/29/2023 15:07   DG CHEST PORT 1 VIEW Result Date: 06/29/2023 CLINICAL DATA:  [id number].  Endotracheal tube present. EXAM: PORTABLE CHEST 1 VIEW COMPARISON:  Portable 06/25/2023. FINDINGS: 4:01 a.m. ETT tip is 6.6 cm from the carina. NGT enters the stomach with the intragastric course not evaluated. Right PICC terminates at the superior cavoatrial junction. Left-greater-than-right airspace disease and small underlying pleural effusions show no interval worsening or improvement. A thin walled cystic cavitary focus in the left mid perihilar area is also unchanged. The the mediastinum is stable. There is aortic atherosclerosis. No new osseous abnormality. IMPRESSION: 1. Left greater than right airspace disease and small underlying pleural  effusions show no interval worsening or improvement. 2. Support apparatus as above. Electronically Signed   By: Almira Bar M.D.   On: 06/29/2023 07:52   DG Chest Port 1 View Result Date: 06/25/2023 CLINICAL DATA:  Pneumonia. EXAM: PORTABLE CHEST 1 VIEW COMPARISON:  Chest radiograph dated 06/24/2023. FINDINGS: Support apparatus in similar position. Interval removal of the left chest tube. No significant interval change in bilateral patchy airspace opacities. Small bilateral pleural effusions. No pneumothorax. Stable cardiac silhouette. No acute osseous pathology. IMPRESSION: 1. Interval removal of the left chest tube. No pneumothorax. 2. No significant interval change in bilateral patchy airspace opacities. Electronically Signed   By: Elgie Collard M.D.   On: 06/25/2023 09:38   DG Abd 1 View Result Date: 06/24/2023 CLINICAL DATA:  [id number]. Confirm nasogastric tube placement. EXAM: ABDOMEN - 1 VIEW  COMPARISON:  Portable chest today at 4:49 a.m. FINDINGS: AP supine views of the upper abdomen partially including the chest at 6:42 p.m.: Interval removal left pigtail pleural tube with no visible pneumothorax. As far as visualized, bilateral widespread airspace disease is not significantly changed, with small underlying pleural effusions and a cavitary lesion in the left mid perihilar area. ETT tip is 4 cm from the carina, NGT tip is in body of stomach adequately inserted. Overlying material limits fine detail in the abdomen. The visualized bowel pattern appears nonobstructive. There is no obvious supine evidence of free air. No obvious pathologic calcifications seen, with the pelvis and outer right hemiabdomen not included. IMPRESSION: 1. NGT tip in body of stomach adequately inserted. 2. ETT tip 4 cm from the carina. 3. Interval removal of left pigtail pleural tube with no visible pneumothorax. 4. No significant change in bilateral widespread airspace disease, with small underlying pleural effusions and a cavitary lesion in the left mid perihilar area. Electronically Signed   By: Almira Bar M.D.   On: 06/24/2023 20:09   DG Chest Port 1 View Result Date: 06/24/2023 CLINICAL DATA:  Pneumothorax.  Chest tube. EXAM: PORTABLE CHEST 1 VIEW COMPARISON:  Radiographs 06/23/2023 and 06/22/2023.  CT 06/15/2023. FINDINGS: 0449 hours. The endotracheal tube, enteric tube, right arm PICC and left pleural pigtail drain are unchanged in position. The heart size and mediastinal contours are stable. No pneumothorax is identified. Unchanged bilateral airspace opacities, some of which remain cavitary. There are probable small bilateral pleural effusions. IMPRESSION: 1. Stable support system. No pneumothorax identified. 2. Unchanged partially cavitary bilateral airspace opacities consistent with septic emboli. Electronically Signed   By: Carey Bullocks M.D.   On: 06/24/2023 08:59   DG Chest Port 1 View Result Date:  06/23/2023 CLINICAL DATA:  Acute respiratory failure. EXAM: PORTABLE CHEST 1 VIEW COMPARISON:  Radiograph yesterday FINDINGS: Endotracheal tube, enteric tube, right upper extremity PICC remain in place. Left basilar pigtail catheter unchanged in positioning. No pneumothorax. Bilateral airspace opacities, some of which are cavitary, without significant change from yesterday. IMPRESSION: 1. Unchanged bilateral airspace opacities, some of which are cavitary. 2. Stable support apparatus. 3. Left pigtail catheter.  No pneumothorax. Electronically Signed   By: Narda Rutherford M.D.   On: 06/23/2023 10:27   VAS [place] LOWER EXTREMITY VENOUS (DVT) Result Date: 06/22/2023  Lower Venous DVT Study Patient Name:  PRIYANSH PRY  Date of Exam:   06/21/2023 Medical Rec #: [id number]       Accession #:    [id number] Date of Birth: July 04, 1975       Patient Gender: M Patient Age:   74 years Exam Location:  [facility] Procedure:      VAS [place] LOWER EXTREMITY VENOUS (DVT) Referring Phys: [facility] ALVA --------------------------------------------------------------------------------  Indications: Swelling.  Risk Factors: None identified. Comparison Study: No prior studies. Performing Technologist: Chanda Busing RVT  Examination Guidelines: A complete evaluation includes B-mode imaging, spectral Doppler, color Doppler, and power Doppler as needed of all accessible portions of each vessel. Bilateral testing is considered an integral part of a complete examination. Limited examinations for reoccurring indications may be performed as noted. The reflux portion of the exam is performed with the patient in reverse Trendelenburg.  +---------+---------------+---------+-----------+----------+--------------+ RIGHT    CompressibilityPhasicitySpontaneityPropertiesThrombus Aging +---------+---------------+---------+-----------+----------+--------------+ CFV      Full           Yes      Yes                                  +---------+---------------+---------+-----------+----------+--------------+ SFJ      Full                                                        +---------+---------------+---------+-----------+----------+--------------+ FV Prox  Full                                                        +---------+---------------+---------+-----------+----------+--------------+ FV Mid   Full                                                        +---------+---------------+---------+-----------+----------+--------------+ FV DistalFull                                                        +---------+---------------+---------+-----------+----------+--------------+ PFV      Full                                                        +---------+---------------+---------+-----------+----------+--------------+ POP      Full           Yes      Yes                                 +---------+---------------+---------+-----------+----------+--------------+ PTV      Full                                                        +---------+---------------+---------+-----------+----------+--------------+ PERO     Full                                                        +---------+---------------+---------+-----------+----------+--------------+   +---------+---------------+---------+-----------+----------+--------------+  LEFT     CompressibilityPhasicitySpontaneityPropertiesThrombus Aging +---------+---------------+---------+-----------+----------+--------------+ CFV      Full           Yes      Yes                                 +---------+---------------+---------+-----------+----------+--------------+ SFJ      Full                                                        +---------+---------------+---------+-----------+----------+--------------+ FV Prox  Full                                                         +---------+---------------+---------+-----------+----------+--------------+ FV Mid   Full                                                        +---------+---------------+---------+-----------+----------+--------------+ FV DistalFull                                                        +---------+---------------+---------+-----------+----------+--------------+ PFV      Full                                                        +---------+---------------+---------+-----------+----------+--------------+ POP      Full           Yes      Yes                                 +---------+---------------+---------+-----------+----------+--------------+ PTV      Full                                                        +---------+---------------+---------+-----------+----------+--------------+ PERO     Full                                                        +---------+---------------+---------+-----------+----------+--------------+     Summary: RIGHT: - There is no evidence of deep vein thrombosis in the lower extremity.  - No cystic structure found in the popliteal fossa.  LEFT: - There is no evidence of deep vein thrombosis in the lower extremity.  - No  cystic structure found in the popliteal fossa.  *See table(s) above for measurements and observations. Electronically signed by Heath Lark on 06/22/2023 at 2:04:32 PM.    Final    DG Chest Port 1 View Result Date: 06/22/2023 CLINICAL DATA:  Acute respiratory failure and sepsis. EXAM: PORTABLE CHEST 1 VIEW COMPARISON:  06/21/2023 FINDINGS: The ET tube tip is above the carina. The enteric tube courses below the GE junction. Right arm PICC line tip is at the superior cavoatrial junction. Stable left-sided chest tube. Bilateral pleural effusions suspected, left greater than right. Bilateral multifocal airspace opacities are identified which are compatible with multifocal pneumonia with septic emboli. On today's study there  are several lucent changes within both lungs concerning for progressive cavitary changes. The most notable is in the left perihilar region measuring 3.2 cm. IMPRESSION: 1. Stable support apparatus. 2. Bilateral multifocal airspace opacities compatible with multifocal pneumonia with septic emboli. 3. Progressive cavitary changes within both lungs. 4. Bilateral pleural effusions, left greater than right. Electronically Signed   By: Signa Kell M.D.   On: 06/22/2023 06:28   MR Lumbar Spine W Wo Contrast Result Date: 06/21/2023 CLINICAL DATA:  Myelopathy, acute, lumbar spine. Infection suspected on CT. EXAM: MRI LUMBAR SPINE WITHOUT AND WITH CONTRAST TECHNIQUE: Multiplanar and multiecho pulse sequences of the lumbar spine were obtained without and with intravenous contrast. CONTRAST:  6mL GADAVIST GADOBUTROL 1 MMOL/ML IV SOLN COMPARISON:  Abdominal CT yesterday. FINDINGS: Segmentation:  5 lumbar type vertebral bodies. Alignment:  Normal Vertebrae: No abnormality rheum T12 through L4. Abnormal edema and enhancement affecting the L5 vertebral body which could go along with osteomyelitis. The L4-5 and L5 disc spaces are normal. There is an abnormal appearance of the paravertebral soft tissues including the epidural space with edema and enhancement consistent with inflammatory change. The epidural changes are phlegmonous at this point in time without what I would characterize as a frank epidural abscess. Conus medullaris and cauda equina: Conus extends to the L1 level. Conus and cauda equina appear normal. Paraspinal and other soft tissues: Otherwise negative Disc levels: No disc level pathology. No degenerative change. No finding to suggest infectious discitis at this time. IMPRESSION: 1. Abnormal edema and enhancement affecting the L5 vertebral body which could go along with osteomyelitis. The L4-5 and L5-S1 disc spaces are normal. 2. Abnormal appearance of the paravertebral soft tissues surrounding L5 including the  epidural space at L5-S1 with edema and enhancement consistent with inflammatory change. The epidural changes are phlegmonous at this point in time without what I would characterize as a frank epidural abscess. Electronically Signed   By: Paulina Fusi M.D.   On: 06/21/2023 18:12   MR THORACIC SPINE W WO CONTRAST Result Date: 06/21/2023 CLINICAL DATA:  Thoracic osteomyelitis. Bacteremia. Elevated white count. EXAM: MRI THORACIC WITHOUT AND WITH CONTRAST TECHNIQUE: Multiplanar and multiecho pulse sequences of the thoracic spine were obtained without and with intravenous contrast. CONTRAST:  6mL GADAVIST GADOBUTROL 1 MMOL/ML IV SOLN COMPARISON:  No recent thoracic imaging. FINDINGS: Alignment:  No malalignment. Vertebrae: No evidence of regional fracture. No abnormal edema or contrast enhancement to suggest discitis, osteomyelitis or septic facet arthritis. Cord:  No cord abnormality seen. Paraspinal and other soft tissues: Extensive patchy bilateral pulmonary pathology, not well evaluated using this technique. Disc levels: No disc level pathology.  No stenosis of the canal or foramina. IMPRESSION: 1. No evidence of discitis, osteomyelitis or septic facet arthritis. 2. Extensive patchy bilateral pulmonary pathology, not well evaluated using this technique. Electronically  Signed   By: Paulina Fusi M.D.   On: 06/21/2023 18:07   DG CHEST PORT 1 VIEW Result Date: 06/21/2023 CLINICAL DATA:  [id number] Acute respiratory failure with hypoxemia (HCC) [id number] EXAM: PORTABLE CHEST 1 VIEW COMPARISON:  Same-day x-ray FINDINGS: Endotracheal tube, enteric tube, right PICC line, and left chest tube are stable in positioning. Stable heart size. Small left pleural effusion. Probable trace right pleural effusion. Extensive airspace opacity throughout the left lung, mildly progressed. Persistent opacity within the periphery of the right upper lobe. No pneumothorax. IMPRESSION: 1. Extensive airspace opacity throughout the left lung,  mildly progressed. 2. Persistent opacity within the periphery of the right upper lobe. 3. Small left pleural effusion. Probable trace right pleural effusion. Electronically Signed   By: Duanne Guess D.O.   On: 06/21/2023 14:15   DG Chest Port 1 View Result Date: 06/21/2023 CLINICAL DATA:  Acute respiratory failure. EXAM: PORTABLE CHEST 1 VIEW COMPARISON:  06/20/2023 and older exams.  CT, 06/15/2023. FINDINGS: Patchy bilateral airspace lung opacities, left greater than right, are unchanged. No new lung abnormalities. No pneumothorax. Left-sided, inferior hemithorax pigtail chest tube is stable. Endotracheal tube, nasal/orogastric tube and right PICC are also stable. IMPRESSION: 1. No change from the previous day's exam. 2. Stable support apparatus. 3. Stable bilateral lung opacities consistent with pneumonia/septic emboli. Electronically Signed   By: Amie Portland M.D.   On: 06/21/2023 10:17   ECHO TEE Result Date: 06/21/2023    TRANSESOPHOGEAL ECHO REPORT   Patient Name:   GEROME KOKESH Date of Exam: 06/19/2023 Medical Rec #:  [id number]      Height:       71.0 in Accession #:    [id number]     Weight:       146.8 lb Date of Birth:  [birth year]      BSA:          1.849 m Patient Age:    47 years       BP:           134/82 mmHg Patient Gender: M              HR:           94 bpm. Exam Location:  Inpatient Procedure: Transesophageal Echo, Cardiac Doppler, Color Doppler and Saline            Contrast Bubble Study (Both Spectral and Color Flow Doppler were            utilized during procedure). Indications:     Septic shock  History:         Patient has prior history of Echocardiogram examinations.                  Signs/Symptoms:Bacteremia. Polysubstance abuse. MRSA.  Sonographer:     Sheralyn Boatman RDCS Referring Phys:  [id number] Parke Poisson Diagnosing Phys: Weston Brass MD PROCEDURE: After discussion of the risks and benefits of a TEE, an informed consent was obtained from a family member. The  transesophogeal probe was passed without difficulty through the esophogus of the patient. Imaged were obtained with the patient in a supine position. Sedation performed by performing physician. Image quality was good. The patient's vital signs; including heart rate, blood pressure, and oxygen saturation; remained stable throughout the procedure. The patient developed no complications during the procedure.  IMPRESSIONS  1. Left ventricular ejection fraction, by estimation, is 45 to 50%. The left ventricle has mildly decreased function.  2. Right ventricular systolic function  is normal. The right ventricular size is normal.  3. No left atrial/left atrial appendage thrombus was detected.  4. The mitral valve is grossly normal. Trivial mitral valve regurgitation.  5. The aortic valve is tricuspid. Aortic valve regurgitation is not visualized.  6. Agitated saline contrast bubble study was positive with shunting observed within 3-6 cardiac cycles suggestive of interatrial shunt, Grade 1. Conclusion(s)/Recommendation(s): No evidence of vegetation/infective endocarditis on this transesophageael echocardiogram. FINDINGS  Left Ventricle: Left ventricular ejection fraction, by estimation, is 45 to 50%. The left ventricle has mildly decreased function. The left ventricular internal cavity size was normal in size. Right Ventricle: The right ventricular size is normal. No increase in right ventricular wall thickness. Right ventricular systolic function is normal. Left Atrium: Left atrial size was normal in size. No left atrial/left atrial appendage thrombus was detected. Right Atrium: Right atrial size was normal in size. Pericardium: There is no evidence of pericardial effusion. Mitral Valve: The mitral valve is grossly normal. Trivial mitral valve regurgitation. Tricuspid Valve: The tricuspid valve is grossly normal. Tricuspid valve regurgitation is trivial. Aortic Valve: The aortic valve is tricuspid. Aortic valve  regurgitation is not visualized. Pulmonic Valve: The pulmonic valve was normal in structure. Pulmonic valve regurgitation is trivial. No evidence of pulmonic stenosis. Aorta: The aortic root and ascending aorta are structurally normal, with no evidence of dilitation. IAS/Shunts: No atrial level shunt detected by color flow Doppler. Agitated saline contrast was given intravenously to evaluate for intracardiac shunting. Agitated saline contrast bubble study was positive with shunting observed within 3-6 cardiac cycles suggestive of interatrial shunt. Additional Comments: Spectral Doppler performed.  AORTA Ao Root diam: 3.70 cm Ao Asc diam:  3.40 cm Weston Brass MD Electronically signed by Weston Brass MD Signature Date/Time: 06/21/2023/7:46:43 AM    Final    DG CHEST PORT 1 VIEW Result Date: 06/20/2023 CLINICAL DATA:  Chest tube EXAM: PORTABLE CHEST 1 VIEW COMPARISON:  06/18/2023, 06/17/2023 FINDINGS: Endotracheal tube tip is about 5.6 cm superior to carina. Enteric tube tip below the diaphragm but incompletely assessed right upper extremity central venous catheter tip at the cavoatrial junction. Interim placement of left-sided chest tube with pigtail at the left lower chest. Compared with scout image from the CT, decreased left basilar pneumothorax with small residual noted. Stable cardiomediastinal silhouette. Heterogeneous airspace disease in the bilateral lungs with multiple cavitary lesions and nodules corresponding to history of septic emboli. IMPRESSION: 1. Interim placement of left-sided chest tube with decreased left basilar pneumothorax, small residual is noted at the left lung base. 2. Heterogeneous airspace disease in the bilateral lungs with multiple cavitary lesions and nodules corresponding to history of septic emboli. Electronically Signed   By: Jasmine Pang M.D.   On: 06/20/2023 22:36   CT ABDOMEN PELVIS W CONTRAST Result Date: 06/20/2023 CLINICAL DATA:  Bacteremia elevated white count  EXAM: CT ABDOMEN AND PELVIS WITH CONTRAST TECHNIQUE: Multidetector CT imaging of the abdomen and pelvis was performed using the standard protocol following bolus administration of intravenous contrast. RADIATION DOSE REDUCTION: This exam was performed according to the departmental dose-optimization program which includes automated exposure control, adjustment of the mA and/or kV according to patient size and/or use of iterative reconstruction technique. CONTRAST:  OMNIPAQUE IOHEXOL 300 MG/ML  SOLN COMPARISON:  Chest x-ray 06/18/2023, CT chest 06/15/2023 FINDINGS: Lower chest: Lung bases demonstrate incompletely visualized small moderate left pneumothorax at the left lung base. Persistent bilateral lower lobe consolidations, with areas of heterogeneous low density and suggestive of necrotic infection.  Multiple cavitary lung lesions felt most consistent with septic emboli. Small bilateral pleural effusions. Hepatobiliary: Contracted gallbladder. No calcified stone. Mild diffuse gallbladder wall thickening. No biliary dilatation Pancreas: Unremarkable. No pancreatic ductal dilatation or surrounding inflammatory changes. Spleen: Normal in size without focal abnormality. Adrenals/Urinary Tract: Adrenal glands are within normal limits. Kidneys show no hydronephrosis. Slightly thick-walled urinary bladder, decompressed by Foley catheter. Stomach/Bowel: Enteric tube tip in the body of stomach. Thickened gastric mucosal folds but decompressed stomach. No dilated small bowel. No acute bowel wall thickening. Negative appendix. Rectal balloon is noted. Vascular/Lymphatic: Nonaneurysmal aorta. No suspicious lymph nodes. Suspicion of hypodense filling defect within the bilateral common iliac veins right greater than left, series 2, image 55 through 61 and coronal series 4 image 41. Reproductive: Negative prostate. Other: No free air. Presacral soft tissue stranding and small volume fluid. Small free fluid within the abdomen  and pelvis. Mild generalized subcutaneous edema Musculoskeletal: No rim enhancing collections within the visualized paraspinal or pelvic musculature. Question small volume low-density fluid along the right anterior margins of the L4 and L5 vertebral bodies, sagittal series 5, image 67 and axial series 2 image 53 through 58. Edema within the subcutaneous soft tissues of the bilateral thighs and along the fascial planes of the proximal thigh muscles. IMPRESSION: 1. Incompletely visualized small to moderate left pneumothorax at the left lung base. 2. Negative for rim enhancing fluid collection within the abdomen or pelvis to suggest drainable abscess. 3. Contracted gallbladder with possible wall thickening but no calcified stones. 4. Persistent bilateral lower lobe consolidations with areas of heterogeneous low density suggestive of necrotic infection. Multiple cavitary lung lesions corresponding to history of septic emboli. Small bilateral pleural effusions. 5. Suspicion of hypodense filling defect within the bilateral common iliac veins right greater than left, suspicious for DVT. 6. Question small volume low-density fluid along the right anterior margins of the L4 and L5 vertebral bodies. No overt osseous destructive change or abnormal appearance of the disc spaces but presence of spine infection cannot be excluded and correlation with MRI should be considered. 7. Small free fluid within the abdomen and pelvis. Presacral soft tissue stranding and small volume fluid. Mild generalized subcutaneous edema. 8. Slightly thick-walled urinary bladder, decompressed by Foley catheter. Critical Value/emergent results were called by telephone at the time of interpretation on 06/20/2023 at 7:45 pm to provider [facility] , who verbally acknowledged these results. Electronically Signed   By: Jasmine Pang M.D.   On: 06/20/2023 19:48   [place] EKG SITE RITE Result Date: 06/19/2023 If Site Rite image not attached, placement could not  be confirmed due to current cardiac rhythm.  DG CHEST PORT 1 VIEW Result Date: 06/18/2023 CLINICAL DATA:  Respiratory EXAM: PORTABLE CHEST 1 VIEW COMPARISON:  06/17/2023 FINDINGS: Cardiac shadow is stable. Endotracheal tube and gastric catheter are again seen in satisfactory position. Lungs are well aerated bilaterally with some persistent nodular opacities similar to that seen on prior exams. No new focal abnormality is noted. IMPRESSION: Stable patchy airspace opacities bilaterally. Electronically Signed   By: Alcide Clever M.D.   On: 06/18/2023 10:40   DG Abd 1 View Result Date: 06/17/2023 CLINICAL DATA:  Orogastric tube placement. EXAM: ABDOMEN - 1 VIEW COMPARISON:  Earlier today FINDINGS: The enteric tube has been advanced, the side port is now below the diaphragm in the stomach. Similar gaseous colonic distension. IMPRESSION: Enteric tube has been advanced, side port now below the diaphragm in the stomach. Electronically Signed   By: Shawna Orleans  Sanford M.D.   On: 06/17/2023 20:24   DG CHEST PORT 1 VIEW Result Date: 06/17/2023 CLINICAL DATA:  Intubation and OG tube placement. EXAM: PORTABLE CHEST 1 VIEW, ABDOMEN ONE VIEW COMPARISON:  06/16/2023. FINDINGS: The heart size and mediastinal contours are stable. Patchy airspace disease is present in the lungs bilaterally. There are small bilateral pleural effusions. No pneumothorax is seen. The endotracheal tube terminates 7.2 cm above the carina. An enteric tube terminates in the region of the gastroesophageal junction and should be advanced 9 cm. Gas-filled loops of colon are noted in the upper abdomen. IMPRESSION: 1. Enteric tube terminates in the region of the gastroesophageal junction and should be advanced 9 cm. 2. Stable patchy airspace disease in the lungs. 3. Small bilateral pleural effusions. Electronically Signed   By: Thornell Sartorius M.D.   On: 06/17/2023 19:15   DG Abd 1 View Result Date: 06/17/2023 CLINICAL DATA:  Intubation and OG tube placement.  EXAM: PORTABLE CHEST 1 VIEW, ABDOMEN ONE VIEW COMPARISON:  06/16/2023. FINDINGS: The heart size and mediastinal contours are stable. Patchy airspace disease is present in the lungs bilaterally. There are small bilateral pleural effusions. No pneumothorax is seen. The endotracheal tube terminates 7.2 cm above the carina. An enteric tube terminates in the region of the gastroesophageal junction and should be advanced 9 cm. Gas-filled loops of colon are noted in the upper abdomen. IMPRESSION: 1. Enteric tube terminates in the region of the gastroesophageal junction and should be advanced 9 cm. 2. Stable patchy airspace disease in the lungs. 3. Small bilateral pleural effusions. Electronically Signed   By: Thornell Sartorius M.D.   On: 06/17/2023 19:15   CT HEAD WO CONTRAST ( ) Result Date: 06/17/2023 CLINICAL DATA:  Head trauma EXAM: CT HEAD WITHOUT CONTRAST TECHNIQUE: Contiguous axial images were obtained from the base of the skull through the vertex without intravenous contrast. RADIATION DOSE REDUCTION: This exam was performed according to the departmental dose-optimization program which includes automated exposure control, adjustment of the mA and/or kV according to patient size and/or use of iterative reconstruction technique. COMPARISON:  06/13/2023 FINDINGS: Brain: No mass,hemorrhage or extra-axial collection. Normal appearance of the parenchyma and CSF spaces. Vascular: No hyperdense vessel or unexpected vascular calcification. Skull: Comminuted nasal bone fractures. Sinuses/Orbits: No fluid levels or advanced mucosal thickening of the visualized paranasal sinuses. No mastoid or middle ear effusion. Normal orbits. Other: Motion degraded study. IMPRESSION: 1. No acute intracranial abnormality. 2. Comminuted nasal bone fractures. Electronically Signed   By: Deatra Robinson M.D.   On: 06/17/2023 01:36   DG CHEST PORT 1 VIEW Result Date: 06/16/2023 CLINICAL DATA:  Abnormal respirations. EXAM: PORTABLE CHEST 1 VIEW  COMPARISON:  Radiograph 06/14/2023.  CT 06/15/2023 FINDINGS: Slight progression in multifocal bilateral lung opacities. Dependent atelectasis in the lung bases. No significant pleural effusion. Stable heart size and mediastinal contours. No pneumothorax. IMPRESSION: Slight progression in multifocal bilateral lung opacities. Electronically Signed   By: Narda Rutherford M.D.   On: 06/16/2023 22:43   CT CHEST WO CONTRAST Result Date: 06/15/2023 CLINICAL DATA:  Pneumonia versus septic emboli, severe sepsis EXAM: CT CHEST WITHOUT CONTRAST TECHNIQUE: Multidetector CT imaging of the chest was performed following the standard protocol without IV contrast. RADIATION DOSE REDUCTION: This exam was performed according to the departmental dose-optimization program which includes automated exposure control, adjustment of the mA and/or kV according to patient size and/or use of iterative reconstruction technique. COMPARISON:  None Available. FINDINGS: Cardiovascular: No significant vascular findings. Cardiomegaly. No pericardial effusion. Mediastinum/Nodes:  No enlarged mediastinal, hilar, or axillary lymph nodes. Thyroid gland, trachea, and esophagus demonstrate no significant findings. Lungs/Pleura: Very extensive heterogeneous and consolidative airspace disease throughout the lungs, much of this nodular in appearance, with several cavitary nodules present, for example in the lingula (series 8, image 70). Small bilateral pleural effusions. Upper Abdomen: No acute abnormality. Musculoskeletal: No chest wall abnormality. No acute osseous findings. IMPRESSION: 1. Very extensive heterogeneous and consolidative airspace disease throughout the lungs, much of this nodular in appearance, with several cavitary nodules present. Findings are highly suspicious for septic emboli given stated clinical concern. 2. Small bilateral pleural effusions. 3. Cardiomegaly. Electronically Signed   By: Jearld Lesch M.D.   On: 06/15/2023 15:46   DG  CHEST PORT 1 VIEW Result Date: 06/14/2023 CLINICAL DATA:  Wheezing.  Shortness of breath. EXAM: PORTABLE CHEST 1 VIEW COMPARISON:  Radiograph yesterday FINDINGS: Development of diffuse multifocal peripheral predominant opacities in both lungs. Worsening peripheral opacity at the left lung base. Probable small pleural effusions. Stable heart size and mediastinal contours. No pneumothorax. IMPRESSION: 1. Development of diffuse multifocal peripheral predominant opacities in both lungs, suspicious for multifocal pneumonia. 2. Probable small pleural effusions. Electronically Signed   By: Narda Rutherford M.D.   On: 06/14/2023 19:18    Microbiology: Results for orders placed or performed during the hospital encounter of 06/13/23  Culture, blood (Routine X 2) w Reflex to ID Panel     Status: Abnormal   Collection Time: 06/13/23  1:49 PM   Specimen: BLOOD  Result Value Ref Range Status   Specimen Description   Final    BLOOD SITE NOT SPECIFIED Performed at [facility], [address], [place], [place] [address]    Special Requests   Final    BOTTLES DRAWN AEROBIC AND ANAEROBIC Blood Culture adequate volume Performed at [facility], [address], [place], [place] [address]    Culture  Setup Time   Final    GRAM POSITIVE COCCI IN CLUSTERS IN BOTH AEROBIC AND ANAEROBIC BOTTLES PHARMD E JACKSON 06/14/2023 @ 0551 BY AB    Culture (A)  Final    METHICILLIN RESISTANT STAPHYLOCOCCUS AUREUS SEE SEPARATE REPORT Performed at [facility], [address] [place], [place] [address]    Report Status 06/27/2023 FINAL  Final   Organism ID, Bacteria METHICILLIN RESISTANT STAPHYLOCOCCUS AUREUS  Final      Susceptibility   Methicillin resistant staphylococcus aureus - MIC*    CIPROFLOXACIN >=8 RESISTANT Resistant     ERYTHROMYCIN >=8 RESISTANT Resistant     GENTAMICIN <=0.5 SENSITIVE Sensitive     OXACILLIN >=4 RESISTANT Resistant     TETRACYCLINE <=1 SENSITIVE  Sensitive     VANCOMYCIN 1 SENSITIVE Sensitive     TRIMETH/SULFA >=320 RESISTANT Resistant     CLINDAMYCIN <=0.25 SENSITIVE Sensitive     RIFAMPIN <=0.5 SENSITIVE Sensitive     Inducible Clindamycin NEGATIVE Sensitive     LINEZOLID 2 SENSITIVE Sensitive     * METHICILLIN RESISTANT STAPHYLOCOCCUS AUREUS  Blood Culture ID Panel (Reflexed)     Status: Abnormal   Collection Time: 06/13/23  1:49 PM  Result Value Ref Range Status   Enterococcus faecalis NOT DETECTED NOT DETECTED Final   Enterococcus Faecium NOT DETECTED NOT DETECTED Final   Listeria monocytogenes NOT DETECTED NOT DETECTED Final   Staphylococcus species DETECTED (A) NOT DETECTED Final    Comment: CRITICAL RESULT CALLED TO, READ BACK BY AND VERIFIED WITH: PHARMD E JACKSON 06/14/2023 @  0551 BY AB    Staphylococcus aureus (BCID) DETECTED (A) NOT DETECTED Final    Comment: Methicillin (oxacillin)-resistant Staphylococcus aureus (MRSA). MRSA is predictably resistant to beta-lactam antibiotics (except ceftaroline). Preferred therapy is vancomycin unless clinically contraindicated. Patient requires contact precautions if  hospitalized. CRITICAL RESULT CALLED TO, READ BACK BY AND VERIFIED WITH: PHARMD E JACKSON 06/14/2023 @ 0551 BY AB    Staphylococcus epidermidis NOT DETECTED NOT DETECTED Final   Staphylococcus lugdunensis NOT DETECTED NOT DETECTED Final   Streptococcus species NOT DETECTED NOT DETECTED Final   Streptococcus agalactiae NOT DETECTED NOT DETECTED Final   Streptococcus pneumoniae NOT DETECTED NOT DETECTED Final   Streptococcus pyogenes NOT DETECTED NOT DETECTED Final   A.calcoaceticus-baumannii NOT DETECTED NOT DETECTED Final   Bacteroides fragilis NOT DETECTED NOT DETECTED Final   Enterobacterales NOT DETECTED NOT DETECTED Final   Enterobacter cloacae complex NOT DETECTED NOT DETECTED Final   Escherichia coli NOT DETECTED NOT DETECTED Final   Klebsiella aerogenes NOT DETECTED NOT DETECTED Final   Klebsiella oxytoca  NOT DETECTED NOT DETECTED Final   Klebsiella pneumoniae NOT DETECTED NOT DETECTED Final   Proteus species NOT DETECTED NOT DETECTED Final   Salmonella species NOT DETECTED NOT DETECTED Final   Serratia marcescens NOT DETECTED NOT DETECTED Final   Haemophilus influenzae NOT DETECTED NOT DETECTED Final   Neisseria meningitidis NOT DETECTED NOT DETECTED Final   Pseudomonas aeruginosa NOT DETECTED NOT DETECTED Final   Stenotrophomonas maltophilia NOT DETECTED NOT DETECTED Final   Candida albicans NOT DETECTED NOT DETECTED Final   Candida auris NOT DETECTED NOT DETECTED Final   Candida glabrata NOT DETECTED NOT DETECTED Final   Candida krusei NOT DETECTED NOT DETECTED Final   Candida parapsilosis NOT DETECTED NOT DETECTED Final   Candida tropicalis NOT DETECTED NOT DETECTED Final   Cryptococcus neoformans/gattii NOT DETECTED NOT DETECTED Final   Meth resistant mecA/C and MREJ DETECTED (A) NOT DETECTED Final    Comment: CRITICAL RESULT CALLED TO, READ BACK BY AND VERIFIED WITH: PHARMD E JACKSON 06/14/2023 @ 0551 BY AB Performed at [facility], [address], [place], [place] [address]   Blood culture (routine x 2)     Status: Abnormal   Collection Time: 06/13/23  1:55 PM   Specimen: BLOOD RIGHT ARM  Result Value Ref Range Status   Specimen Description   Final    BLOOD RIGHT ARM Performed at [facility], [address], [place], [place] [address]    Special Requests   Final    BOTTLES DRAWN AEROBIC AND ANAEROBIC Blood Culture results may not be optimal due to an inadequate volume of blood received in culture bottles Performed at [facility], [address] [place], [place] [address]    Culture  Setup Time   Final    GRAM POSITIVE COCCI IN CLUSTERS IN BOTH AEROBIC AND ANAEROBIC BOTTLES CRITICAL VALUE NOTED.  VALUE IS CONSISTENT WITH PREVIOUSLY REPORTED AND CALLED VALUE.    Culture (A)  Final    STAPHYLOCOCCUS AUREUS SUSCEPTIBILITIES PERFORMED ON  PREVIOUS CULTURE WITHIN THE LAST 5 DAYS. Performed at [facility], [address], [place], [place] [address]    Report Status 06/17/2023 FINAL  Final  Resp panel by RT-PCR (RSV, Flu A&B, Covid) Anterior Nasal Swab     Status: None   Collection Time: 06/13/23  1:56 PM   Specimen: Anterior Nasal Swab  Result Value Ref Range Status   SARS Coronavirus 2 by RT PCR NEGATIVE NEGATIVE  Final    Comment: (NOTE) SARS-CoV-2 target nucleic acids are NOT DETECTED.  The SARS-CoV-2 RNA is generally detectable in upper respiratory specimens during the acute phase of infection. The lowest concentration of SARS-CoV-2 viral copies this assay can detect is 138 copies/mL. A negative result does not preclude SARS-Cov-2 infection and should not be used as the sole basis for treatment or other patient management decisions. A negative result may occur with  improper specimen collection/handling, submission of specimen other than nasopharyngeal swab, presence of viral mutation(s) within the areas targeted by this assay, and inadequate number of viral copies(<138 copies/mL). A negative result must be combined with clinical observations, patient history, and epidemiological information. The expected result is Negative.  Fact Sheet for Patients:  BloggerCourse.com  Fact Sheet for Healthcare Providers:  SeriousBroker.it  This test is no t yet approved or cleared by the [place] FDA and  has been authorized for detection and/or diagnosis of SARS-CoV-2 by FDA under an Emergency Use Authorization (EUA). This EUA will remain  in effect (meaning this test can be used) for the duration of the COVID-19 declaration under Section 564(b)(1) of the Act, 21 U.S.C.section 360bbb-3(b)(1), unless the authorization is terminated  or revoked sooner.       Influenza A by PCR NEGATIVE NEGATIVE Final   Influenza B by PCR NEGATIVE NEGATIVE Final    Comment:  (NOTE) The Xpert Xpress SARS-CoV-2/FLU/RSV plus assay is intended as an aid in the diagnosis of influenza from Nasopharyngeal swab specimens and should not be used as a sole basis for treatment. Nasal washings and aspirates are unacceptable for Xpert Xpress SARS-CoV-2/FLU/RSV testing.  Fact Sheet for Patients: BloggerCourse.com  Fact Sheet for Healthcare Providers: SeriousBroker.it  This test is not yet approved or cleared by the [place] FDA and has been authorized for detection and/or diagnosis of SARS-CoV-2 by FDA under an Emergency Use Authorization (EUA). This EUA will remain in effect (meaning this test can be used) for the duration of the COVID-19 declaration under Section 564(b)(1) of the Act, 21 U.S.C. section 360bbb-3(b)(1), unless the authorization is terminated or revoked.     Resp Syncytial Virus by PCR NEGATIVE NEGATIVE Final    Comment: (NOTE) Fact Sheet for Patients: BloggerCourse.com  Fact Sheet for Healthcare Providers: SeriousBroker.it  This test is not yet approved or cleared by the [place] FDA and has been authorized for detection and/or diagnosis of SARS-CoV-2 by FDA under an Emergency Use Authorization (EUA). This EUA will remain in effect (meaning this test can be used) for the duration of the COVID-19 declaration under Section 564(b)(1) of the Act, 21 U.S.C. section 360bbb-3(b)(1), unless the authorization is terminated or revoked.  Performed at [facility], [address], [place], [place] [address]   Urine Culture     Status: None   Collection Time: 06/13/23  2:27 PM   Specimen: Urine, Clean Catch  Result Value Ref Range Status   Specimen Description   Final    URINE, CLEAN CATCH Performed at [facility], [address] [place], [place] [address]    Special Requests   Final    NONE Performed  at [facility], [address], [place], [place] [address]    Culture   Final    NO GROWTH Performed at [facility], [address], [place], [place] [address]    Report Status 06/14/2023 FINAL  Final  Culture, blood (Routine X 2) w Reflex to ID Panel  Status: None   Collection Time: 06/15/23 10:27 PM   Specimen: BLOOD RIGHT HAND  Result Value Ref Range Status   Specimen Description   Final    BLOOD RIGHT HAND Performed at [facility], [address] [place], [place] [address]    Special Requests   Final    BOTTLES DRAWN AEROBIC ONLY Blood Culture results may not be optimal due to an inadequate volume of blood received in culture bottles Performed at [facility], [address] [place], [place] [address]    Culture   Final    NO GROWTH 5 DAYS Performed at [facility], [address], [place], [place] [address]    Report Status 06/21/2023 FINAL  Final  Culture, blood (Routine X 2) w Reflex to ID Panel     Status: None   Collection Time: 06/15/23 10:27 PM   Specimen: BLOOD RIGHT HAND  Result Value Ref Range Status   Specimen Description   Final    BLOOD RIGHT HAND Performed at [facility], [address] [place], [place] [address]    Special Requests   Final    BOTTLES DRAWN AEROBIC ONLY Blood Culture results may not be optimal due to an inadequate volume of blood received in culture bottles Performed at [facility], [address] [place], [place] [address]    Culture   Final    NO GROWTH 5 DAYS Performed at [facility], [address] [place], [place] [address]    Report Status 06/21/2023 FINAL  Final  Culture, Respiratory w Gram Stain     Status: None   Collection Time: 06/18/23  8:19 AM   Specimen: Tracheal Aspirate; Respiratory  Result Value Ref Range Status   Specimen Description   Final    TRACHEAL ASPIRATE Performed at [facility], [address] [place], [place] [address]    Special Requests   Final    NONE Performed at [facility], [address] [place], [place] [address]    Gram Stain   Final    FEW WBC PRESENT, PREDOMINANTLY PMN RARE YEAST WITH PSEUDOHYPHAE Performed at [facility], [address] [place], [place] [address]    Culture FEW METHICILLIN RESISTANT STAPHYLOCOCCUS AUREUS  Final   Report Status 06/22/2023 FINAL  Final   Organism ID, Bacteria METHICILLIN RESISTANT STAPHYLOCOCCUS AUREUS  Final      Susceptibility   Methicillin resistant staphylococcus aureus - MIC*    CIPROFLOXACIN >=8 RESISTANT Resistant     ERYTHROMYCIN >=8 RESISTANT Resistant     GENTAMICIN <=0.5 SENSITIVE Sensitive     OXACILLIN >=4 RESISTANT Resistant     TETRACYCLINE <=1 SENSITIVE Sensitive     VANCOMYCIN 1 SENSITIVE Sensitive     TRIMETH/SULFA 80 RESISTANT Resistant     CLINDAMYCIN <=0.25 SENSITIVE Sensitive     RIFAMPIN <=0.5 SENSITIVE Sensitive     Inducible Clindamycin NEGATIVE Sensitive     LINEZOLID 2 SENSITIVE Sensitive     * FEW METHICILLIN RESISTANT STAPHYLOCOCCUS AUREUS  MIC (1 Drug)-blood culture; 06/13/2023; BLOOD; MRSA; Daptomycin; Patient immune status: Normal     Status: Abnormal   Collection Time: 06/20/23 12:56 PM   Specimen: BLOOD  Result Value Ref Range Status   Min Inhibitory Conc (1 Drug) Final report (A)  Corrected    Comment: (NOTE) Performed At: [facility] [address] [place], [place] [id number] Jolene Schimke MD SA:[id number] CORRECTED ON  03/19 AT 1235: PREVIOUSLY REPORTED AS Preliminary report    Source BLOOD  Final    Comment: Performed at [facility], [address], [place], [place] [address]  MIC Result     Status: Abnormal   Collection Time: 06/20/23 12:56 PM  Result Value Ref Range Status   Result 1 (MIC) Comment (A)  Final    Comment: (NOTE) Methicillin - resistant Staphylococcus aureus Identification performed by account, not confirmed by  this laboratory. Testing performed by broth microdilution. DAPTOMYCIN  <=0.25  SUSCEPTIBLE Performed At: [facility] [address] [place], [place] [id number] Jolene Schimke MD XB:[id number]   Culture, Respiratory w Gram Stain     Status: None   Collection Time: 06/22/23  8:31 AM   Specimen: Tracheal Aspirate; Respiratory  Result Value Ref Range Status   Specimen Description   Final    TRACHEAL ASPIRATE Performed at [facility], [address] [place], [place] [address]    Special Requests   Final    NONE Performed at [facility], [address], [place], [place] [address]    Gram Stain   Final    RARE WBC PRESENT, PREDOMINANTLY PMN FEW GRAM POSITIVE COCCI FEW GRAM NEGATIVE RODS Performed at [facility], [address], [place], [place] [address]    Culture   Final    ABUNDANT PSEUDOMONAS AERUGINOSA ABUNDANT METHICILLIN RESISTANT STAPHYLOCOCCUS AUREUS    Report Status 06/24/2023 FINAL  Final   Organism ID, Bacteria PSEUDOMONAS AERUGINOSA  Final   Organism ID, Bacteria METHICILLIN RESISTANT STAPHYLOCOCCUS AUREUS  Final      Susceptibility   Methicillin resistant staphylococcus aureus - MIC*    CIPROFLOXACIN >=8 RESISTANT Resistant     ERYTHROMYCIN >=8 RESISTANT Resistant     GENTAMICIN <=0.5 SENSITIVE Sensitive     OXACILLIN >=4 RESISTANT Resistant     TETRACYCLINE <=1 SENSITIVE Sensitive     VANCOMYCIN 1 SENSITIVE Sensitive     TRIMETH/SULFA 160 RESISTANT Resistant     CLINDAMYCIN <=0.25 SENSITIVE Sensitive     RIFAMPIN <=0.5 SENSITIVE Sensitive     Inducible Clindamycin NEGATIVE Sensitive     LINEZOLID 2 SENSITIVE Sensitive     * ABUNDANT METHICILLIN RESISTANT STAPHYLOCOCCUS AUREUS   Pseudomonas aeruginosa - MIC*    CEFTAZIDIME 4 SENSITIVE Sensitive     CIPROFLOXACIN <=0.25 SENSITIVE Sensitive     GENTAMICIN 4 SENSITIVE Sensitive     IMIPENEM 2 SENSITIVE Sensitive     PIP/TAZO 16 SENSITIVE Sensitive ug/mL     CEFEPIME 2 SENSITIVE Sensitive     * ABUNDANT PSEUDOMONAS AERUGINOSA  Culture, Respiratory w Gram Stain     Status: None   Collection Time: 06/29/23  1:25 PM   Specimen: Tracheal Aspirate; Respiratory  Result Value Ref Range Status   Specimen Description   Final    TRACHEAL ASPIRATE Performed at [facility], [address] [place], [place] [address]    Special Requests   Final    NONE Performed at [facility], [address] [place], [place] [address]    Gram Stain   Final    NO WBC SEEN RARE GRAM POSITIVE COCCI RARE GRAM NEGATIVE RODS    Culture   Final    ABUNDANT METHICILLIN RESISTANT STAPHYLOCOCCUS AUREUS FEW PSEUDOMONAS AERUGINOSA HEALTH DEPARTMENT NOTIFIED Performed at [facility], [address], [place], [place] [address]    Report Status 07/01/2023 FINAL  Final   Organism ID, Bacteria  METHICILLIN RESISTANT STAPHYLOCOCCUS AUREUS  Final   Organism ID, Bacteria PSEUDOMONAS AERUGINOSA  Final      Susceptibility   Methicillin resistant staphylococcus aureus - MIC*    CIPROFLOXACIN >=8 RESISTANT Resistant     ERYTHROMYCIN >=8 RESISTANT Resistant     GENTAMICIN <=0.5 SENSITIVE Sensitive     OXACILLIN >=4 RESISTANT Resistant     TETRACYCLINE <=1 SENSITIVE Sensitive     VANCOMYCIN 1 SENSITIVE Sensitive     TRIMETH/SULFA >=320 RESISTANT Resistant     CLINDAMYCIN <=0.25 SENSITIVE Sensitive     RIFAMPIN <=0.5 SENSITIVE Sensitive     Inducible Clindamycin NEGATIVE Sensitive     LINEZOLID 2 SENSITIVE Sensitive     * ABUNDANT METHICILLIN RESISTANT STAPHYLOCOCCUS AUREUS   Pseudomonas aeruginosa - MIC*    CEFTAZIDIME 2 SENSITIVE Sensitive     CIPROFLOXACIN 2 RESISTANT Resistant     GENTAMICIN <=1 SENSITIVE Sensitive     IMIPENEM >=16 RESISTANT Resistant     PIP/TAZO <=4 SENSITIVE Sensitive ug/mL    CEFEPIME 2 SENSITIVE Sensitive     * FEW PSEUDOMONAS AERUGINOSA  Culture, blood (Routine X 2) w Reflex to ID Panel     Status:  None   Collection Time: 06/30/23 10:22 AM   Specimen: BLOOD  Result Value Ref Range Status   Specimen Description   Final    BLOOD BLOOD LEFT HAND AEROBIC BOTTLE ONLY Performed at [facility], [address] [place], [place] [address]    Special Requests   Final    BOTTLES DRAWN AEROBIC ONLY Blood Culture results may not be optimal due to an inadequate volume of blood received in culture bottles Performed at [facility], [address] [place], [place] [address]    Culture   Final    NO GROWTH 5 DAYS Performed at [facility], [address], [place], [place] [address]    Report Status 07/05/2023 FINAL  Final  Culture, blood (Routine X 2) w Reflex to ID Panel     Status: None   Collection Time: 06/30/23 10:28 AM   Specimen: BLOOD  Result Value Ref Range Status   Specimen Description   Final    BLOOD BLOOD LEFT HAND AEROBIC BOTTLE ONLY Performed at [facility], [address], [place], [place] [address]    Special Requests   Final    BOTTLES DRAWN AEROBIC ONLY Blood Culture results may not be optimal due to an inadequate volume of blood received in culture bottles Performed at [facility], [address] [place], [place] [address]    Culture   Final    NO GROWTH 5 DAYS Performed at [facility], [address], [place], [place] [address]    Report Status 07/05/2023 FINAL  Final    Labs: CBC: Recent Labs  Lab 07/08/23 0442 07/09/23 0408 07/11/23 0328 07/12/23 0445 07/14/23 0310  WBC 20.2* 19.3* 17.2* 15.8* 15.5*  NEUTROABS  --  14.0*  --  11.6* 12.5*  HGB 8.2* 8.1* 7.7* 7.8* 7.1*  HCT 26.3* 25.7* 25.4* 25.8* 23.0*  MCV 87.7 87.4 89.1 89.0 87.5  PLT 764* 737* 618* 561* 494*   Basic Metabolic Panel: Recent Labs  Lab 07/08/23 0442 07/10/23 0451 07/11/23 0328 07/12/23 0445 07/14/23 0310  NA 133* 132* 133* 134* 133*  K 3.8 3.7 4.1 4.2 4.1  CL 95* 94* 94* 97* 96*  CO2  28 29 28 29 27   GLUCOSE 121* 123* 123* 113* 112*  BUN 28* 25* 28* 31* 31*  CREATININE 1.11 0.96 0.92 1.07 0.94  CALCIUM 8.7* 8.8* 8.8* 8.9 8.5*  MG  --   --  2.1 2.2  --    Liver Function Tests: Recent Labs  Lab 07/08/23 0442 07/10/23 0451 07/11/23 0328 07/12/23 0445 07/14/23 0310  AST 29 26 28 27 19   ALT 45* 37 34 33 24  ALKPHOS 94 84 80 78 73  BILITOT 0.3 0.5 0.3 0.2 0.7  PROT 8.9* 9.1* 9.1* 8.9* 8.5*  ALBUMIN 2.0* 2.0* 2.1* 2.1* 2.1*   CBG: No results for input(s): "GLUCAP" in the last 168 hours.  Discharge time spent: greater than 30 minutes.  Signed: Jonah Blue, MD [facility] 07/14/2023

## 2023-07-14 NOTE — Progress Notes (Addendum)
 TOC meds in a secure bag delivered to pt in room by this RN. AVS reviewed w/ pt and mother who verbalized an understanding. Pt dressing for d/c to home. Eliquis teaching done by pharmacist. [facility] contacted via secure chat about [facility] application/form

## 2023-07-14 NOTE — Plan of Care (Signed)

## 2023-07-14 NOTE — TOC Transition Note (Addendum)
 Transition of Care [facility]) - Discharge Note   Patient Details  Name: Brendan Preston MRN: [id number] Date of Birth: 29-Dec-1975  [facility] CM/SW Contact:  Lanier Clam, RN Phone Number: 07/14/2023, 11:05 AM   Clinical Narrative:  See prior TOC note for d/c resources already set up. Provided w/HHC agency list-patient has no insurance, d/c to [employer] mother is transporting to her home.Resources provided on d/c instructions list of HHC agencies to call if they have charity in [place] for [facility] physical therapy.No further TOC needs. -2p  MATCH MEDICATION ASSISTANCE CARD Pharmacies please call [phone] for claim processing assistance.  Rx BIN: [id number] Rx Group: [id number] Rx PCN: [id number] Relationship Code: 1 Person Code: [id number]  Patient ID (MRN): [name]    Patient Name: Brendan Preston   Patient DOB:[birth year]   Discharge Date:07/14/2023  Expiration Date:07/21/2023 (must be filled within 7 days of discharge)  Dear [name] You have been approved to have the prescriptions written by your discharging physician filled through our [facility] (Medication Assistance Through [facility]) program. This program allows for a one-time (no refills) 34-day supply of selected medications for a low copay amount.  The copay is $3.00 per prescription. For instance, if you have one prescription, you will pay $3.00; for two prescriptions, you pay $6.00; for three prescriptions, you pay $9.00; and so on. Only certain pharmacies are participating in this program with [facility]. You will need to select one of the pharmacies from the attached lists and take your prescriptions, this letter, and your photo ID to one of the participating pharmacies.  We are excited that you are able to use the [facility] program to get your medications. These prescriptions must be filled within 7 days of hospital discharge or they will no longer be valid for the [facility] program. Should you have any problems  with your prescriptions please contact your case management team member at [phone] for [place]// or [phone] for [facility].  Thank you, [place]       Final next level of care: Home/Self Care Barriers to Discharge: No Home Care Agency will accept this patient, Other (must enter comment) (No health insurance;hx:polysubstance use.)   Patient Goals and CMS Choice Patient states their goals for this hospitalization and ongoing recovery are:: Home CMS Medicare.gov Compare Post Acute Care list provided to:: Patient Choice offered to / list presented to : Patient [place] ownership interest in [facility].provided to:: Patient    Discharge Placement                       Discharge Plan and Services Additional resources added to the After Visit Summary for       Post Acute Care Choice: NA            DME Agency: NA                  Social Drivers of Health (SDOH) Interventions SDOH Screenings   Food Insecurity: Patient Declined (06/15/2023)  Housing: Patient Unable To Answer (06/29/2023)  Transportation Needs: Patient Unable To Answer (06/16/2023)  Utilities: Patient Unable To Answer (06/16/2023)  Tobacco Use: High Risk (06/29/2023)     Readmission Risk Interventions    07/09/2023   10:22 AM 06/14/2023    8:46 AM  Readmission Risk Prevention Plan  Post Dischage Appt  Complete  Medication Screening  Complete  Transportation Screening Complete Complete  PCP or Specialist Appt within 5-7 Days  Complete   Home Care Screening Complete   Medication Review (RN CM) Complete

## 2023-07-15 ENCOUNTER — Encounter (HOSPITAL_COMMUNITY): Payer: Self-pay | Admitting: Emergency Medicine

## 2023-07-28 ENCOUNTER — Telehealth: Payer: Self-pay

## 2023-07-28 NOTE — Telephone Encounter (Signed)
 Patient called back reporting excruciating pain. Patient advised he will need to be seen at the ED in [facility].  Marty Sadlowski Adel Holt, CMA

## 2023-07-28 NOTE — Telephone Encounter (Signed)
 Notified by front desk that patient is on the phone to cancel 5/1 follow up. He is now in [place]  indefinitely. Advised front desk staff to have patient schedule with an Infectious Disease provider in [place].   Aisling Emigh, BSN, RN

## 2023-07-31 ENCOUNTER — Telehealth: Payer: Self-pay

## 2023-07-31 NOTE — Telephone Encounter (Signed)
 Caller called from [place]  stating yesterday he coughed up "5 chunks of blood" and again this morning. Is asking if this is normal with his infection.  Have not seen patient in office. Advised patient to go to local ED for evaluation. Verbalized understanding  Julien Odor, RMA

## 2023-08-07 ENCOUNTER — Inpatient Hospital Stay: Payer: Self-pay | Admitting: Internal Medicine

## 2023-08-07 LAB — CULTURE, RESPIRATORY W GRAM STAIN: Gram Stain: NONE SEEN
